# Patient Record
Sex: Male | Born: 1950 | Race: White | Hispanic: No | Marital: Married | Smoking: Former smoker
Health system: Southern US, Community
[De-identification: ages and names within clinical notes are randomized; demographics above are authoritative.]

## PROBLEM LIST (undated history)

## (undated) DIAGNOSIS — M545 Low back pain, unspecified: Secondary | ICD-10-CM

## (undated) DIAGNOSIS — C160 Malignant neoplasm of cardia: Secondary | ICD-10-CM

## (undated) DIAGNOSIS — R251 Tremor, unspecified: Secondary | ICD-10-CM

## (undated) DIAGNOSIS — E7800 Pure hypercholesterolemia, unspecified: Secondary | ICD-10-CM

## (undated) DIAGNOSIS — I219 Acute myocardial infarction, unspecified: Secondary | ICD-10-CM

## (undated) DIAGNOSIS — F419 Anxiety disorder, unspecified: Secondary | ICD-10-CM

## (undated) DIAGNOSIS — I519 Heart disease, unspecified: Secondary | ICD-10-CM

## (undated) DIAGNOSIS — E119 Type 2 diabetes mellitus without complications: Secondary | ICD-10-CM

## (undated) DIAGNOSIS — N400 Enlarged prostate without lower urinary tract symptoms: Secondary | ICD-10-CM

## (undated) DIAGNOSIS — I10 Essential (primary) hypertension: Secondary | ICD-10-CM

## (undated) HISTORY — DX: Heart disease, unspecified: I51.9

## (undated) HISTORY — DX: Pure hypercholesterolemia, unspecified: E78.00

## (undated) HISTORY — DX: Tremor, unspecified: R25.1

## (undated) HISTORY — DX: Essential (primary) hypertension: I10

## (undated) HISTORY — PX: OTHER SURGICAL HISTORY: SHX169

## (undated) HISTORY — DX: Low back pain: M54.5

## (undated) HISTORY — DX: Benign prostatic hyperplasia without lower urinary tract symptoms: N40.0

## (undated) HISTORY — DX: Acute myocardial infarction, unspecified: I21.9

## (undated) HISTORY — DX: Low back pain, unspecified: M54.50

## (undated) HISTORY — DX: Type 2 diabetes mellitus without complications: E11.9

## (undated) HISTORY — DX: Malignant neoplasm of cardia: C16.0

---

## 2015-12-08 ENCOUNTER — Encounter: Payer: Self-pay | Admitting: Neurology

## 2015-12-08 ENCOUNTER — Ambulatory Visit (INDEPENDENT_AMBULATORY_CARE_PROVIDER_SITE_OTHER): Payer: Medicare HMO | Admitting: Neurology

## 2015-12-08 VITALS — BP 111/71 | HR 65 | Ht 70.0 in | Wt 235.0 lb

## 2015-12-08 DIAGNOSIS — R251 Tremor, unspecified: Secondary | ICD-10-CM | POA: Diagnosis not present

## 2015-12-08 NOTE — Progress Notes (Signed)
PATIENT: Timothy Oconnor DOB: 21-Sep-1951  Chief Complaint  Patient presents with  . Tremors    He is here to have his right hand tremor evaluated.  His symptom has been present for 18 months.    HISTORICAL  Timothy Oconnor is a 65 years old right-handed male, seen in refer by his primary care physician Dr. [name] for evaluation of right hand tremor  He began to notice right hand tremor since 2013, most noticeable when he holding utensils with his right hand, playing golf, intermittent, does not affecting his daily activity, seems to be gradually getting worse, no significant involvement of his left hand, he noticed mild unbalance, he denies loss sense of smell, he denies sleep difficulty.  He denies bilateral lower extremity weakness or paresthesia, there was no family history of tremor,  Lab evaluation in  Nov 2016 showed normal CBC, CMP, with elevated Glucose 190, LDL 85, cholestrol 126, normal TSH, A1c 8.0  REVIEW OF SYSTEMS: Full 14 system review of systems performed and notable only for tremor, snoring, leg pain  ALLERGIES: No Known Allergies  HOME MEDICATIONS: Current Outpatient Prescriptions  Medication Sig Dispense Refill  . aspirin EC 81 MG tablet Take 81 mg by mouth.    Marland Kitchen atenolol (TENORMIN) 25 MG tablet Take 25 mg by mouth.    Marland Kitchen atorvastatin (LIPITOR) 80 MG tablet Take 80 mg by mouth.    . clopidogrel (PLAVIX) 75 MG tablet Take 75 mg by mouth.    . ezetimibe (ZETIA) 10 MG tablet Take 10 mg by mouth.    . folic acid (FOLVITE) Q000111Q MCG tablet Take 800 mcg by mouth.    . Glucosamine HCl (GLUCOSAMINE PO) Take 120 mg by mouth 2 (two) times daily.    Marland Kitchen losartan (COZAAR) 50 MG tablet Take 25 mg by mouth.    . metFORMIN (GLUMETZA) 500 MG (MOD) 24 hr tablet Take 500 mg by mouth daily with breakfast.    . Multiple Vitamin (MULTI-VITAMINS) TABS Take 1 tablet by mouth.    . Omega-3 Fatty Acids (FISH OIL) 1000 MG CAPS Take by mouth 2 (two) times daily.    . Saw Palmetto 450 MG  CAPS Take by mouth.    . tamsulosin (FLOMAX) 0.4 MG CAPS capsule Take 0.4 mg by mouth.     No current facility-administered medications for this visit.    PAST MEDICAL HISTORY: Past Medical History  Diagnosis Date  . Hypertension   . High cholesterol   . Heart disease   . Low back pain   . Diabetes ([place])   . Hyperplasia of prostate   . Heart attack ([place])   . Tremor     PAST SURGICAL HISTORY: Past Surgical History  Procedure Laterality Date  . Heart stents      2002, 2004, 2006  . Arterectomy      1993    FAMILY HISTORY: Family History  Problem Relation Age of Onset  . Diabetes Father   . Heart disease Father   . Heart attack Brother   . Colon cancer Brother   . Alzheimer's disease Mother   . Stroke Father     SOCIAL HISTORY:  Social History   Social History  . Marital Status: Married    Spouse Name: N/A  . Number of Children: 2  . Years of Education: 16   Occupational History  . [employer]     Delivers medication   Social History Main Topics  . Smoking status: Former Research scientist (life sciences)  .  Smokeless tobacco: Not on file     Comment: Quit 2004  . Alcohol Use: 0.0 oz/week    0 Standard drinks or equivalent per week     Comment: 3 drinks per week  . Drug Use: No  . Sexual Activity: Not on file   Other Topics Concern  . Not on file   Social History Narrative   Lives at home with his wife.   Right-handed.   2 diet cokes per day.     PHYSICAL EXAM   Filed Vitals:   12/08/15 0733  BP: 111/71  Pulse: 65  Height: 5\' 10"  (1.778 m)  Weight: 235 lb (106.595 kg)    Not recorded      Body mass index is 33.72 kg/(m^2).  PHYSICAL EXAMNIATION:  Gen: NAD, conversant, well nourised, obese, well groomed                     Cardiovascular: Regular rate rhythm, no peripheral edema, warm, nontender. Eyes: Conjunctivae clear without exudates or hemorrhage Neck: Supple, no carotid bruise. Pulmonary: Clear to auscultation bilaterally   NEUROLOGICAL  EXAM:  MENTAL STATUS: Speech:    Speech is normal; fluent and spontaneous with normal comprehension.  Cognition:     Orientation to time, place and person     Normal recent and remote memory     Normal Attention span and concentration     Normal Language, naming, repeating,spontaneous speech     Fund of knowledge   CRANIAL NERVES: CN II: Visual fields are full to confrontation. Fundoscopic exam is normal with sharp discs and no vascular changes. Pupils are round equal and briskly reactive to light. CN III, IV, VI: extraocular movement are normal. No ptosis. CN V: Facial sensation is intact to pinprick in all 3 divisions bilaterally. Corneal responses are intact.  CN VII: Face is symmetric with normal eye closure and smile. CN VIII: Hearing is normal to rubbing fingers CN IX, X: Palate elevates symmetrically. Phonation is normal. CN XI: Head turning and shoulder shrug are intact CN XII: Tongue is midline with normal movements and no atrophy.  MOTOR: He has mild right hand posturing tremor, Muscle bulk and tone are normal. Muscle strength is normal.  REFLEXES: Reflexes are 2+ and symmetric at the biceps, triceps, knees, and ankles. Plantar responses are flexor.  SENSORY: Intact to light touch, pinprick, position sense, and vibration sense are intact in fingers and toes.  COORDINATION: Rapid alternating movements and fine finger movements are intact. There is no dysmetria on finger-to-nose and heel-knee-shin.    GAIT/STANCE: Posture is normal. Gait is steady with normal steps, base, arm swing, and turning. Heel and toe walking are normal. Tandem gait is normal.  Romberg is absent.   DIAGNOSTIC DATA (LABS, IMAGING, TESTING) - I reviewed patient records, labs, notes, testing and imaging myself where available.   ASSESSMENT AND PLAN  Timothy Oconnor is a 65 y.o. male   Mild right hand posturing tremor   No parkinsonian features  Differentiation diagnosis include asymmetric  essential tremor, exaggerated physiological tremor  Continue to observe his symptoms, will hold of evaluation at this point  Return to clinic in one year   Timothy Oconnor, M.D. Ph.D.  [facility] [address] [place],  [address] Ph: [phone] Fax: [phone]  CC: Referring Provider

## 2016-01-13 DIAGNOSIS — Z8249 Family history of ischemic heart disease and other diseases of the circulatory system: Secondary | ICD-10-CM | POA: Diagnosis not present

## 2016-01-13 DIAGNOSIS — Z955 Presence of coronary angioplasty implant and graft: Secondary | ICD-10-CM | POA: Diagnosis not present

## 2016-01-13 DIAGNOSIS — Z1211 Encounter for screening for malignant neoplasm of colon: Secondary | ICD-10-CM | POA: Diagnosis not present

## 2016-01-13 DIAGNOSIS — Z7982 Long term (current) use of aspirin: Secondary | ICD-10-CM | POA: Diagnosis not present

## 2016-01-13 DIAGNOSIS — E119 Type 2 diabetes mellitus without complications: Secondary | ICD-10-CM | POA: Diagnosis not present

## 2016-01-13 DIAGNOSIS — Z7902 Long term (current) use of antithrombotics/antiplatelets: Secondary | ICD-10-CM | POA: Diagnosis not present

## 2016-01-13 DIAGNOSIS — Z7984 Long term (current) use of oral hypoglycemic drugs: Secondary | ICD-10-CM | POA: Diagnosis not present

## 2016-01-13 DIAGNOSIS — Z79899 Other long term (current) drug therapy: Secondary | ICD-10-CM | POA: Diagnosis not present

## 2016-01-13 DIAGNOSIS — Z833 Family history of diabetes mellitus: Secondary | ICD-10-CM | POA: Diagnosis not present

## 2016-01-13 DIAGNOSIS — Z800 Family history of malignant neoplasm of digestive organs: Secondary | ICD-10-CM | POA: Diagnosis not present

## 2016-01-19 DIAGNOSIS — I25110 Atherosclerotic heart disease of native coronary artery with unstable angina pectoris: Secondary | ICD-10-CM | POA: Diagnosis not present

## 2016-01-19 DIAGNOSIS — I259 Chronic ischemic heart disease, unspecified: Secondary | ICD-10-CM | POA: Diagnosis not present

## 2016-01-19 DIAGNOSIS — I252 Old myocardial infarction: Secondary | ICD-10-CM | POA: Diagnosis not present

## 2016-01-19 DIAGNOSIS — Z7982 Long term (current) use of aspirin: Secondary | ICD-10-CM | POA: Diagnosis not present

## 2016-03-09 DIAGNOSIS — E1165 Type 2 diabetes mellitus with hyperglycemia: Secondary | ICD-10-CM | POA: Diagnosis not present

## 2016-03-09 DIAGNOSIS — R251 Tremor, unspecified: Secondary | ICD-10-CM | POA: Diagnosis not present

## 2016-05-29 DIAGNOSIS — L570 Actinic keratosis: Secondary | ICD-10-CM | POA: Diagnosis not present

## 2016-07-31 DIAGNOSIS — I10 Essential (primary) hypertension: Secondary | ICD-10-CM | POA: Diagnosis not present

## 2016-07-31 DIAGNOSIS — E7800 Pure hypercholesterolemia, unspecified: Secondary | ICD-10-CM | POA: Diagnosis not present

## 2016-07-31 DIAGNOSIS — E1165 Type 2 diabetes mellitus with hyperglycemia: Secondary | ICD-10-CM | POA: Diagnosis not present

## 2016-08-03 DIAGNOSIS — R251 Tremor, unspecified: Secondary | ICD-10-CM | POA: Diagnosis not present

## 2016-08-03 DIAGNOSIS — M545 Low back pain: Secondary | ICD-10-CM | POA: Diagnosis not present

## 2016-08-03 DIAGNOSIS — Z6830 Body mass index (BMI) 30.0-30.9, adult: Secondary | ICD-10-CM | POA: Diagnosis not present

## 2016-08-03 DIAGNOSIS — I10 Essential (primary) hypertension: Secondary | ICD-10-CM | POA: Diagnosis not present

## 2016-08-03 DIAGNOSIS — Z13220 Encounter for screening for lipoid disorders: Secondary | ICD-10-CM | POA: Diagnosis not present

## 2016-08-03 DIAGNOSIS — E1165 Type 2 diabetes mellitus with hyperglycemia: Secondary | ICD-10-CM | POA: Diagnosis not present

## 2016-08-03 DIAGNOSIS — M5431 Sciatica, right side: Secondary | ICD-10-CM | POA: Diagnosis not present

## 2016-12-06 DIAGNOSIS — E1165 Type 2 diabetes mellitus with hyperglycemia: Secondary | ICD-10-CM | POA: Diagnosis not present

## 2016-12-06 DIAGNOSIS — E7800 Pure hypercholesterolemia, unspecified: Secondary | ICD-10-CM | POA: Diagnosis not present

## 2016-12-06 DIAGNOSIS — I10 Essential (primary) hypertension: Secondary | ICD-10-CM | POA: Diagnosis not present

## 2016-12-06 DIAGNOSIS — R251 Tremor, unspecified: Secondary | ICD-10-CM | POA: Diagnosis not present

## 2016-12-07 ENCOUNTER — Ambulatory Visit (INDEPENDENT_AMBULATORY_CARE_PROVIDER_SITE_OTHER): Payer: Medicare HMO | Admitting: Neurology

## 2016-12-07 ENCOUNTER — Encounter: Payer: Self-pay | Admitting: Neurology

## 2016-12-07 VITALS — BP 131/80 | HR 64 | Ht 70.0 in | Wt 222.0 lb

## 2016-12-07 DIAGNOSIS — R251 Tremor, unspecified: Secondary | ICD-10-CM

## 2016-12-07 NOTE — Progress Notes (Signed)
PATIENT: Timothy Oconnor DOB: [birth year]  Chief Complaint  Patient presents with  . Tremors    He is here for his yearly follow up.  Reports his intermittent, right-hand tremor is unchanged.    HISTORICAL  Timothy Oconnor is a 66 years old right-handed male, seen in refer by his primary care physician Dr. [name] for evaluation of right hand tremor. Initial evaluation was in Jan 4th 2017.  He began to notice right hand tremor since 2013, most noticeable when he holding utensils with his right hand, playing golf, intermittent, does not affecting his daily activity, seems to be gradually getting worse, no significant involvement of his left hand, he noticed mild unbalance, he denies loss sense of smell, he denies sleep difficulty.  He denies bilateral lower extremity weakness or paresthesia, there was no family history of tremor,  Lab evaluation in  Nov 2016 showed normal CBC, CMP, with elevated Glucose 190, LDL 85, cholestrol 126, normal TSH, A1c 8.0  UPDATE Jan 4th 2018: He still has intermittent right hand tremor, no significant worsening, no limitation on his daily activities.  REVIEW OF SYSTEMS: Full 14 system review of systems performed and notable only for as above   ALLERGIES: No Known Allergies  HOME MEDICATIONS: Current Outpatient Prescriptions  Medication Sig Dispense Refill  . aspirin EC 81 MG tablet Take 81 mg by mouth.    Marland Kitchen atenolol (TENORMIN) 25 MG tablet Take 25 mg by mouth.    Marland Kitchen atorvastatin (LIPITOR) 80 MG tablet Take 80 mg by mouth.    . clopidogrel (PLAVIX) 75 MG tablet Take 75 mg by mouth.    . ezetimibe (ZETIA) 10 MG tablet Take 10 mg by mouth.    . folic acid (FOLVITE) Q000111Q MCG tablet Take 800 mcg by mouth.    . Glucosamine HCl (GLUCOSAMINE PO) Take 120 mg by mouth 2 (two) times daily.    Marland Kitchen losartan (COZAAR) 50 MG tablet Take 25 mg by mouth.    . metFORMIN (GLUMETZA) 500 MG (MOD) 24 hr tablet Take 500 mg by mouth daily with breakfast.    . Multiple  Vitamin (MULTI-VITAMINS) TABS Take 1 tablet by mouth.    . Omega-3 Fatty Acids (FISH OIL) 1000 MG CAPS Take by mouth 2 (two) times daily.    . Saw Palmetto 450 MG CAPS Take by mouth.    . tamsulosin (FLOMAX) 0.4 MG CAPS capsule Take 0.4 mg by mouth.     No current facility-administered medications for this visit.     PAST MEDICAL HISTORY: Past Medical History:  Diagnosis Date  . Diabetes ([place])   . Heart attack   . Heart disease   . High cholesterol   . Hyperplasia of prostate   . Hypertension   . Low back pain   . Tremor     PAST SURGICAL HISTORY: Past Surgical History:  Procedure Laterality Date  . arterectomy     1993  . Heart stents     2002, 2004, 2006    FAMILY HISTORY: Family History  Problem Relation Age of Onset  . Diabetes Father   . Heart disease Father   . Heart attack Brother   . Colon cancer Brother   . Alzheimer's disease Mother   . Stroke Father     SOCIAL HISTORY:  Social History   Social History  . Marital status: Married    Spouse name: N/A  . Number of children: 2  . Years of education: 16   Occupational History  .  [employer]     Delivers medication   Social History Main Topics  . Smoking status: Former Research scientist (life sciences)  . Smokeless tobacco: Not on file     Comment: Quit 2004  . Alcohol use 0.0 oz/week     Comment: 3 drinks per week  . Drug use: No  . Sexual activity: Not on file   Other Topics Concern  . Not on file   Social History Narrative   Lives at home with his wife.   Right-handed.   2 diet cokes per day.     PHYSICAL EXAM   Vitals:   12/07/16 0730  BP: 131/80  Pulse: 64  Weight: 222 lb (100.7 kg)  Height: 5\' 10"  (1.778 m)    Not recorded      Body mass index is 31.85 kg/m.  PHYSICAL EXAMNIATION:  Gen: NAD, conversant, well nourised, obese, well groomed                     Cardiovascular: Regular rate rhythm, no peripheral edema, warm, nontender. Eyes: Conjunctivae clear without exudates or hemorrhage Neck:  Supple, no carotid bruise. Pulmonary: Clear to auscultation bilaterally   NEUROLOGICAL EXAM:  MENTAL STATUS: Speech:    Speech is normal; fluent and spontaneous with normal comprehension.  Cognition:     Orientation to time, place and person     Normal recent and remote memory     Normal Attention span and concentration     Normal Language, naming, repeating,spontaneous speech     Fund of knowledge   CRANIAL NERVES: CN II: Visual fields are full to confrontation. Fundoscopic exam is normal with sharp discs and no vascular changes. Pupils are round equal and briskly reactive to light. CN III, IV, VI: extraocular movement are normal. No ptosis. CN V: Facial sensation is intact to pinprick in all 3 divisions bilaterally. Corneal responses are intact.  CN VII: Face is symmetric with normal eye closure and smile. CN VIII: Hearing is normal to rubbing fingers CN IX, X: Palate elevates symmetrically. Phonation is normal. CN XI: Head turning and shoulder shrug are intact CN XII: Tongue is midline with normal movements and no atrophy.  MOTOR: He has mild right hand posturing tremor, Muscle bulk and tone are normal. Muscle strength is normal.  REFLEXES: Reflexes are 2+ and symmetric at the biceps, triceps, knees, and ankles. Plantar responses are flexor.  SENSORY: Intact to light touch, pinprick, position sense, and vibration sense are intact in fingers and toes.  COORDINATION: Rapid alternating movements and fine finger movements are intact. There is no dysmetria on finger-to-nose and heel-knee-shin.    GAIT/STANCE: Posture is normal. Gait is steady with normal steps, base, arm swing, and turning. Heel and toe walking are normal. Tandem gait is normal.  Romberg is absent.   DIAGNOSTIC DATA (LABS, IMAGING, TESTING) - I reviewed patient records, labs, notes, testing and imaging myself where available.   ASSESSMENT AND PLAN  Timothy Oconnor is a 66 y.o. male   Mild right hand  posturing tremor   No parkinsonian features  Differentiation diagnosis include asymmetric essential tremor, exaggerated physiological tremor  Continue to observe his symptoms, will hold of evaluation at this point     Marcial Pacas, M.D. Ph.D.  [facility] [address] [place], [place] [address] Ph: [phone] Fax: [phone]  CC: Referring Provider

## 2016-12-12 DIAGNOSIS — M545 Low back pain: Secondary | ICD-10-CM | POA: Diagnosis not present

## 2016-12-12 DIAGNOSIS — I10 Essential (primary) hypertension: Secondary | ICD-10-CM | POA: Diagnosis not present

## 2016-12-12 DIAGNOSIS — R251 Tremor, unspecified: Secondary | ICD-10-CM | POA: Diagnosis not present

## 2016-12-12 DIAGNOSIS — Z0001 Encounter for general adult medical examination with abnormal findings: Secondary | ICD-10-CM | POA: Diagnosis not present

## 2016-12-12 DIAGNOSIS — Z1389 Encounter for screening for other disorder: Secondary | ICD-10-CM | POA: Diagnosis not present

## 2016-12-12 DIAGNOSIS — Z6830 Body mass index (BMI) 30.0-30.9, adult: Secondary | ICD-10-CM | POA: Diagnosis not present

## 2016-12-18 DIAGNOSIS — L570 Actinic keratosis: Secondary | ICD-10-CM | POA: Diagnosis not present

## 2017-01-24 DIAGNOSIS — Z7982 Long term (current) use of aspirin: Secondary | ICD-10-CM | POA: Diagnosis not present

## 2017-01-24 DIAGNOSIS — Z7901 Long term (current) use of anticoagulants: Secondary | ICD-10-CM | POA: Diagnosis not present

## 2017-01-24 DIAGNOSIS — Z955 Presence of coronary angioplasty implant and graft: Secondary | ICD-10-CM | POA: Diagnosis not present

## 2017-01-24 DIAGNOSIS — I259 Chronic ischemic heart disease, unspecified: Secondary | ICD-10-CM | POA: Diagnosis not present

## 2017-01-24 DIAGNOSIS — I25118 Atherosclerotic heart disease of native coronary artery with other forms of angina pectoris: Secondary | ICD-10-CM | POA: Diagnosis not present

## 2017-04-18 DIAGNOSIS — Z6832 Body mass index (BMI) 32.0-32.9, adult: Secondary | ICD-10-CM | POA: Diagnosis not present

## 2017-04-18 DIAGNOSIS — J0101 Acute recurrent maxillary sinusitis: Secondary | ICD-10-CM | POA: Diagnosis not present

## 2017-06-26 DIAGNOSIS — D485 Neoplasm of uncertain behavior of skin: Secondary | ICD-10-CM | POA: Diagnosis not present

## 2017-06-26 DIAGNOSIS — L570 Actinic keratosis: Secondary | ICD-10-CM | POA: Diagnosis not present

## 2017-06-26 DIAGNOSIS — Z85828 Personal history of other malignant neoplasm of skin: Secondary | ICD-10-CM | POA: Diagnosis not present

## 2017-09-10 DIAGNOSIS — Z23 Encounter for immunization: Secondary | ICD-10-CM | POA: Diagnosis not present

## 2017-09-12 DIAGNOSIS — R310 Gross hematuria: Secondary | ICD-10-CM | POA: Diagnosis not present

## 2017-09-12 DIAGNOSIS — Z6833 Body mass index (BMI) 33.0-33.9, adult: Secondary | ICD-10-CM | POA: Diagnosis not present

## 2017-09-18 DIAGNOSIS — R310 Gross hematuria: Secondary | ICD-10-CM | POA: Diagnosis not present

## 2017-09-18 DIAGNOSIS — N281 Cyst of kidney, acquired: Secondary | ICD-10-CM | POA: Diagnosis not present

## 2017-09-18 DIAGNOSIS — N400 Enlarged prostate without lower urinary tract symptoms: Secondary | ICD-10-CM | POA: Diagnosis not present

## 2017-09-30 DIAGNOSIS — Z87891 Personal history of nicotine dependence: Secondary | ICD-10-CM | POA: Diagnosis not present

## 2017-09-30 DIAGNOSIS — R9431 Abnormal electrocardiogram [ECG] [EKG]: Secondary | ICD-10-CM | POA: Diagnosis not present

## 2017-09-30 DIAGNOSIS — Z9861 Coronary angioplasty status: Secondary | ICD-10-CM | POA: Diagnosis not present

## 2017-09-30 DIAGNOSIS — I2510 Atherosclerotic heart disease of native coronary artery without angina pectoris: Secondary | ICD-10-CM | POA: Diagnosis not present

## 2017-09-30 DIAGNOSIS — Z955 Presence of coronary angioplasty implant and graft: Secondary | ICD-10-CM | POA: Diagnosis not present

## 2017-09-30 DIAGNOSIS — I110 Hypertensive heart disease with heart failure: Secondary | ICD-10-CM | POA: Diagnosis not present

## 2017-09-30 DIAGNOSIS — E7800 Pure hypercholesterolemia, unspecified: Secondary | ICD-10-CM | POA: Diagnosis not present

## 2017-09-30 DIAGNOSIS — Z79899 Other long term (current) drug therapy: Secondary | ICD-10-CM | POA: Diagnosis not present

## 2017-09-30 DIAGNOSIS — I5020 Unspecified systolic (congestive) heart failure: Secondary | ICD-10-CM | POA: Diagnosis not present

## 2017-09-30 DIAGNOSIS — I10 Essential (primary) hypertension: Secondary | ICD-10-CM | POA: Diagnosis not present

## 2017-09-30 DIAGNOSIS — Z952 Presence of prosthetic heart valve: Secondary | ICD-10-CM | POA: Diagnosis not present

## 2017-09-30 DIAGNOSIS — I2129 ST elevation (STEMI) myocardial infarction involving other sites: Secondary | ICD-10-CM | POA: Diagnosis not present

## 2017-09-30 DIAGNOSIS — Z7902 Long term (current) use of antithrombotics/antiplatelets: Secondary | ICD-10-CM | POA: Diagnosis not present

## 2017-09-30 DIAGNOSIS — I219 Acute myocardial infarction, unspecified: Secondary | ICD-10-CM | POA: Diagnosis not present

## 2017-09-30 DIAGNOSIS — I2121 ST elevation (STEMI) myocardial infarction involving left circumflex coronary artery: Secondary | ICD-10-CM | POA: Diagnosis not present

## 2017-09-30 DIAGNOSIS — E118 Type 2 diabetes mellitus with unspecified complications: Secondary | ICD-10-CM | POA: Diagnosis not present

## 2017-09-30 DIAGNOSIS — N400 Enlarged prostate without lower urinary tract symptoms: Secondary | ICD-10-CM | POA: Diagnosis not present

## 2017-09-30 DIAGNOSIS — Z7901 Long term (current) use of anticoagulants: Secondary | ICD-10-CM | POA: Diagnosis not present

## 2017-09-30 DIAGNOSIS — I252 Old myocardial infarction: Secondary | ICD-10-CM | POA: Diagnosis not present

## 2017-09-30 DIAGNOSIS — E119 Type 2 diabetes mellitus without complications: Secondary | ICD-10-CM | POA: Diagnosis not present

## 2017-09-30 DIAGNOSIS — Z7982 Long term (current) use of aspirin: Secondary | ICD-10-CM | POA: Diagnosis not present

## 2017-09-30 DIAGNOSIS — R079 Chest pain, unspecified: Secondary | ICD-10-CM | POA: Diagnosis not present

## 2017-09-30 DIAGNOSIS — Z66 Do not resuscitate: Secondary | ICD-10-CM | POA: Diagnosis not present

## 2017-09-30 DIAGNOSIS — Z7984 Long term (current) use of oral hypoglycemic drugs: Secondary | ICD-10-CM | POA: Diagnosis not present

## 2017-09-30 DIAGNOSIS — I213 ST elevation (STEMI) myocardial infarction of unspecified site: Secondary | ICD-10-CM | POA: Diagnosis not present

## 2017-09-30 DIAGNOSIS — I2584 Coronary atherosclerosis due to calcified coronary lesion: Secondary | ICD-10-CM | POA: Diagnosis not present

## 2017-09-30 DIAGNOSIS — I25119 Atherosclerotic heart disease of native coronary artery with unspecified angina pectoris: Secondary | ICD-10-CM | POA: Diagnosis not present

## 2017-09-30 DIAGNOSIS — I348 Other nonrheumatic mitral valve disorders: Secondary | ICD-10-CM | POA: Diagnosis not present

## 2017-11-01 DIAGNOSIS — I10 Essential (primary) hypertension: Secondary | ICD-10-CM | POA: Diagnosis not present

## 2017-11-01 DIAGNOSIS — Z6832 Body mass index (BMI) 32.0-32.9, adult: Secondary | ICD-10-CM | POA: Diagnosis not present

## 2017-11-01 DIAGNOSIS — I25119 Atherosclerotic heart disease of native coronary artery with unspecified angina pectoris: Secondary | ICD-10-CM | POA: Diagnosis not present

## 2017-11-01 DIAGNOSIS — E7800 Pure hypercholesterolemia, unspecified: Secondary | ICD-10-CM | POA: Diagnosis not present

## 2017-11-06 DIAGNOSIS — Z7982 Long term (current) use of aspirin: Secondary | ICD-10-CM | POA: Diagnosis not present

## 2017-11-06 DIAGNOSIS — I259 Chronic ischemic heart disease, unspecified: Secondary | ICD-10-CM | POA: Diagnosis not present

## 2017-11-06 DIAGNOSIS — I25118 Atherosclerotic heart disease of native coronary artery with other forms of angina pectoris: Secondary | ICD-10-CM | POA: Diagnosis not present

## 2017-11-06 DIAGNOSIS — I252 Old myocardial infarction: Secondary | ICD-10-CM | POA: Diagnosis not present

## 2017-11-06 DIAGNOSIS — Z955 Presence of coronary angioplasty implant and graft: Secondary | ICD-10-CM | POA: Diagnosis not present

## 2017-11-06 DIAGNOSIS — R001 Bradycardia, unspecified: Secondary | ICD-10-CM | POA: Diagnosis not present

## 2017-11-06 DIAGNOSIS — Z87891 Personal history of nicotine dependence: Secondary | ICD-10-CM | POA: Diagnosis not present

## 2017-11-06 DIAGNOSIS — I25110 Atherosclerotic heart disease of native coronary artery with unstable angina pectoris: Secondary | ICD-10-CM | POA: Diagnosis not present

## 2017-11-06 DIAGNOSIS — Z7901 Long term (current) use of anticoagulants: Secondary | ICD-10-CM | POA: Diagnosis not present

## 2017-11-23 ENCOUNTER — Ambulatory Visit: Payer: Medicare HMO | Admitting: Urology

## 2017-11-23 ENCOUNTER — Other Ambulatory Visit (HOSPITAL_COMMUNITY)
Admission: RE | Admit: 2017-11-23 | Discharge: 2017-11-23 | Disposition: A | Discharge status: Home / Self Care | Payer: Medicare HMO | Source: Ambulatory Visit | Attending: Urology | Admitting: Urology

## 2017-11-23 DIAGNOSIS — R828 Abnormal findings on cytological and histological examination of urine: Secondary | ICD-10-CM | POA: Diagnosis not present

## 2017-11-23 DIAGNOSIS — N401 Enlarged prostate with lower urinary tract symptoms: Secondary | ICD-10-CM | POA: Diagnosis not present

## 2017-11-23 DIAGNOSIS — R310 Gross hematuria: Secondary | ICD-10-CM

## 2017-11-23 DIAGNOSIS — R350 Frequency of micturition: Secondary | ICD-10-CM

## 2017-12-13 DIAGNOSIS — I10 Essential (primary) hypertension: Secondary | ICD-10-CM | POA: Diagnosis not present

## 2017-12-13 DIAGNOSIS — E7800 Pure hypercholesterolemia, unspecified: Secondary | ICD-10-CM | POA: Diagnosis not present

## 2017-12-13 DIAGNOSIS — E1165 Type 2 diabetes mellitus with hyperglycemia: Secondary | ICD-10-CM | POA: Diagnosis not present

## 2017-12-14 ENCOUNTER — Ambulatory Visit: Payer: Medicare HMO | Admitting: Urology

## 2017-12-14 DIAGNOSIS — N401 Enlarged prostate with lower urinary tract symptoms: Secondary | ICD-10-CM

## 2017-12-14 DIAGNOSIS — R310 Gross hematuria: Secondary | ICD-10-CM

## 2017-12-14 DIAGNOSIS — R351 Nocturia: Secondary | ICD-10-CM | POA: Diagnosis not present

## 2017-12-17 DIAGNOSIS — E1165 Type 2 diabetes mellitus with hyperglycemia: Secondary | ICD-10-CM | POA: Diagnosis not present

## 2017-12-17 DIAGNOSIS — N400 Enlarged prostate without lower urinary tract symptoms: Secondary | ICD-10-CM | POA: Diagnosis not present

## 2017-12-17 DIAGNOSIS — E7800 Pure hypercholesterolemia, unspecified: Secondary | ICD-10-CM | POA: Diagnosis not present

## 2017-12-17 DIAGNOSIS — I25119 Atherosclerotic heart disease of native coronary artery with unspecified angina pectoris: Secondary | ICD-10-CM | POA: Diagnosis not present

## 2017-12-17 DIAGNOSIS — Z6833 Body mass index (BMI) 33.0-33.9, adult: Secondary | ICD-10-CM | POA: Diagnosis not present

## 2017-12-17 DIAGNOSIS — Z0001 Encounter for general adult medical examination with abnormal findings: Secondary | ICD-10-CM | POA: Diagnosis not present

## 2017-12-25 DIAGNOSIS — L570 Actinic keratosis: Secondary | ICD-10-CM | POA: Diagnosis not present

## 2018-02-05 DIAGNOSIS — Z87891 Personal history of nicotine dependence: Secondary | ICD-10-CM | POA: Diagnosis not present

## 2018-02-05 DIAGNOSIS — Z7901 Long term (current) use of anticoagulants: Secondary | ICD-10-CM | POA: Diagnosis not present

## 2018-02-05 DIAGNOSIS — Z7982 Long term (current) use of aspirin: Secondary | ICD-10-CM | POA: Diagnosis not present

## 2018-02-05 DIAGNOSIS — I252 Old myocardial infarction: Secondary | ICD-10-CM | POA: Diagnosis not present

## 2018-02-05 DIAGNOSIS — I25118 Atherosclerotic heart disease of native coronary artery with other forms of angina pectoris: Secondary | ICD-10-CM | POA: Diagnosis not present

## 2018-02-05 DIAGNOSIS — Z955 Presence of coronary angioplasty implant and graft: Secondary | ICD-10-CM | POA: Diagnosis not present

## 2018-02-05 DIAGNOSIS — I2510 Atherosclerotic heart disease of native coronary artery without angina pectoris: Secondary | ICD-10-CM | POA: Diagnosis not present

## 2018-06-17 DIAGNOSIS — E7800 Pure hypercholesterolemia, unspecified: Secondary | ICD-10-CM | POA: Diagnosis not present

## 2018-06-17 DIAGNOSIS — E1165 Type 2 diabetes mellitus with hyperglycemia: Secondary | ICD-10-CM | POA: Diagnosis not present

## 2018-06-17 DIAGNOSIS — I10 Essential (primary) hypertension: Secondary | ICD-10-CM | POA: Diagnosis not present

## 2018-06-17 DIAGNOSIS — M545 Low back pain: Secondary | ICD-10-CM | POA: Diagnosis not present

## 2018-06-24 DIAGNOSIS — D485 Neoplasm of uncertain behavior of skin: Secondary | ICD-10-CM | POA: Diagnosis not present

## 2018-06-24 DIAGNOSIS — Z85828 Personal history of other malignant neoplasm of skin: Secondary | ICD-10-CM | POA: Diagnosis not present

## 2018-06-24 DIAGNOSIS — L570 Actinic keratosis: Secondary | ICD-10-CM | POA: Diagnosis not present

## 2018-06-26 DIAGNOSIS — E7800 Pure hypercholesterolemia, unspecified: Secondary | ICD-10-CM | POA: Diagnosis not present

## 2018-06-26 DIAGNOSIS — I25119 Atherosclerotic heart disease of native coronary artery with unspecified angina pectoris: Secondary | ICD-10-CM | POA: Diagnosis not present

## 2018-06-26 DIAGNOSIS — Z6832 Body mass index (BMI) 32.0-32.9, adult: Secondary | ICD-10-CM | POA: Diagnosis not present

## 2018-06-26 DIAGNOSIS — E1165 Type 2 diabetes mellitus with hyperglycemia: Secondary | ICD-10-CM | POA: Diagnosis not present

## 2018-06-26 DIAGNOSIS — I10 Essential (primary) hypertension: Secondary | ICD-10-CM | POA: Diagnosis not present

## 2018-06-26 DIAGNOSIS — R251 Tremor, unspecified: Secondary | ICD-10-CM | POA: Diagnosis not present

## 2018-08-20 DIAGNOSIS — Z87891 Personal history of nicotine dependence: Secondary | ICD-10-CM | POA: Diagnosis not present

## 2018-08-20 DIAGNOSIS — I259 Chronic ischemic heart disease, unspecified: Secondary | ICD-10-CM | POA: Diagnosis not present

## 2018-08-20 DIAGNOSIS — I25119 Atherosclerotic heart disease of native coronary artery with unspecified angina pectoris: Secondary | ICD-10-CM | POA: Diagnosis not present

## 2018-08-20 DIAGNOSIS — Z95818 Presence of other cardiac implants and grafts: Secondary | ICD-10-CM | POA: Diagnosis not present

## 2018-08-20 DIAGNOSIS — R9431 Abnormal electrocardiogram [ECG] [EKG]: Secondary | ICD-10-CM | POA: Diagnosis not present

## 2018-08-20 DIAGNOSIS — I252 Old myocardial infarction: Secondary | ICD-10-CM | POA: Diagnosis not present

## 2018-08-20 DIAGNOSIS — Z955 Presence of coronary angioplasty implant and graft: Secondary | ICD-10-CM | POA: Diagnosis not present

## 2018-08-20 DIAGNOSIS — Z7982 Long term (current) use of aspirin: Secondary | ICD-10-CM | POA: Diagnosis not present

## 2018-08-20 DIAGNOSIS — I2510 Atherosclerotic heart disease of native coronary artery without angina pectoris: Secondary | ICD-10-CM | POA: Diagnosis not present

## 2018-08-20 DIAGNOSIS — Z79899 Other long term (current) drug therapy: Secondary | ICD-10-CM | POA: Diagnosis not present

## 2018-08-26 DIAGNOSIS — R69 Illness, unspecified: Secondary | ICD-10-CM | POA: Diagnosis not present

## 2018-12-09 DIAGNOSIS — K921 Melena: Secondary | ICD-10-CM | POA: Diagnosis not present

## 2018-12-09 DIAGNOSIS — I10 Essential (primary) hypertension: Secondary | ICD-10-CM | POA: Diagnosis not present

## 2018-12-09 DIAGNOSIS — J0101 Acute recurrent maxillary sinusitis: Secondary | ICD-10-CM | POA: Diagnosis not present

## 2018-12-09 DIAGNOSIS — I25119 Atherosclerotic heart disease of native coronary artery with unspecified angina pectoris: Secondary | ICD-10-CM | POA: Diagnosis not present

## 2018-12-09 DIAGNOSIS — Z6833 Body mass index (BMI) 33.0-33.9, adult: Secondary | ICD-10-CM | POA: Diagnosis not present

## 2018-12-09 DIAGNOSIS — E1165 Type 2 diabetes mellitus with hyperglycemia: Secondary | ICD-10-CM | POA: Diagnosis not present

## 2018-12-18 DIAGNOSIS — E7800 Pure hypercholesterolemia, unspecified: Secondary | ICD-10-CM | POA: Diagnosis not present

## 2018-12-18 DIAGNOSIS — I25119 Atherosclerotic heart disease of native coronary artery with unspecified angina pectoris: Secondary | ICD-10-CM | POA: Diagnosis not present

## 2018-12-18 DIAGNOSIS — E1165 Type 2 diabetes mellitus with hyperglycemia: Secondary | ICD-10-CM | POA: Diagnosis not present

## 2018-12-18 DIAGNOSIS — I10 Essential (primary) hypertension: Secondary | ICD-10-CM | POA: Diagnosis not present

## 2018-12-26 DIAGNOSIS — Z6832 Body mass index (BMI) 32.0-32.9, adult: Secondary | ICD-10-CM | POA: Diagnosis not present

## 2018-12-26 DIAGNOSIS — Z0001 Encounter for general adult medical examination with abnormal findings: Secondary | ICD-10-CM | POA: Diagnosis not present

## 2018-12-26 DIAGNOSIS — I10 Essential (primary) hypertension: Secondary | ICD-10-CM | POA: Diagnosis not present

## 2018-12-30 DIAGNOSIS — L570 Actinic keratosis: Secondary | ICD-10-CM | POA: Diagnosis not present

## 2018-12-30 DIAGNOSIS — D485 Neoplasm of uncertain behavior of skin: Secondary | ICD-10-CM | POA: Diagnosis not present

## 2018-12-30 DIAGNOSIS — L111 Transient acantholytic dermatosis [Grover]: Secondary | ICD-10-CM | POA: Diagnosis not present

## 2019-01-22 DIAGNOSIS — D649 Anemia, unspecified: Secondary | ICD-10-CM | POA: Diagnosis not present

## 2019-01-22 DIAGNOSIS — D529 Folate deficiency anemia, unspecified: Secondary | ICD-10-CM | POA: Diagnosis not present

## 2019-01-22 DIAGNOSIS — D519 Vitamin B12 deficiency anemia, unspecified: Secondary | ICD-10-CM | POA: Diagnosis not present

## 2019-03-05 DIAGNOSIS — D529 Folate deficiency anemia, unspecified: Secondary | ICD-10-CM | POA: Diagnosis not present

## 2019-03-05 DIAGNOSIS — D519 Vitamin B12 deficiency anemia, unspecified: Secondary | ICD-10-CM | POA: Diagnosis not present

## 2019-03-05 DIAGNOSIS — D649 Anemia, unspecified: Secondary | ICD-10-CM | POA: Diagnosis not present

## 2019-05-27 DIAGNOSIS — N400 Enlarged prostate without lower urinary tract symptoms: Secondary | ICD-10-CM | POA: Diagnosis not present

## 2019-05-27 DIAGNOSIS — I10 Essential (primary) hypertension: Secondary | ICD-10-CM | POA: Diagnosis not present

## 2019-05-27 DIAGNOSIS — I252 Old myocardial infarction: Secondary | ICD-10-CM | POA: Diagnosis not present

## 2019-05-27 DIAGNOSIS — I213 ST elevation (STEMI) myocardial infarction of unspecified site: Secondary | ICD-10-CM | POA: Diagnosis not present

## 2019-05-27 DIAGNOSIS — I25110 Atherosclerotic heart disease of native coronary artery with unstable angina pectoris: Secondary | ICD-10-CM | POA: Diagnosis not present

## 2019-05-27 DIAGNOSIS — Z87891 Personal history of nicotine dependence: Secondary | ICD-10-CM | POA: Diagnosis not present

## 2019-05-27 DIAGNOSIS — Z955 Presence of coronary angioplasty implant and graft: Secondary | ICD-10-CM | POA: Diagnosis not present

## 2019-05-27 DIAGNOSIS — E785 Hyperlipidemia, unspecified: Secondary | ICD-10-CM | POA: Diagnosis not present

## 2019-05-27 DIAGNOSIS — I25118 Atherosclerotic heart disease of native coronary artery with other forms of angina pectoris: Secondary | ICD-10-CM | POA: Diagnosis not present

## 2019-05-27 DIAGNOSIS — Z7982 Long term (current) use of aspirin: Secondary | ICD-10-CM | POA: Diagnosis not present

## 2019-06-24 DIAGNOSIS — C159 Malignant neoplasm of esophagus, unspecified: Secondary | ICD-10-CM | POA: Diagnosis not present

## 2019-07-01 DIAGNOSIS — L570 Actinic keratosis: Secondary | ICD-10-CM | POA: Diagnosis not present

## 2019-07-01 DIAGNOSIS — D485 Neoplasm of uncertain behavior of skin: Secondary | ICD-10-CM | POA: Diagnosis not present

## 2019-07-01 DIAGNOSIS — D0439 Carcinoma in situ of skin of other parts of face: Secondary | ICD-10-CM | POA: Diagnosis not present

## 2019-07-04 DIAGNOSIS — E119 Type 2 diabetes mellitus without complications: Secondary | ICD-10-CM | POA: Diagnosis not present

## 2019-07-04 DIAGNOSIS — I10 Essential (primary) hypertension: Secondary | ICD-10-CM | POA: Diagnosis not present

## 2019-07-10 DIAGNOSIS — C44329 Squamous cell carcinoma of skin of other parts of face: Secondary | ICD-10-CM | POA: Diagnosis not present

## 2019-07-16 DIAGNOSIS — R69 Illness, unspecified: Secondary | ICD-10-CM | POA: Diagnosis not present

## 2019-07-21 DIAGNOSIS — S233XXA Sprain of ligaments of thoracic spine, initial encounter: Secondary | ICD-10-CM | POA: Diagnosis not present

## 2019-07-21 DIAGNOSIS — S335XXA Sprain of ligaments of lumbar spine, initial encounter: Secondary | ICD-10-CM | POA: Diagnosis not present

## 2019-07-21 DIAGNOSIS — M9902 Segmental and somatic dysfunction of thoracic region: Secondary | ICD-10-CM | POA: Diagnosis not present

## 2019-07-21 DIAGNOSIS — M47816 Spondylosis without myelopathy or radiculopathy, lumbar region: Secondary | ICD-10-CM | POA: Diagnosis not present

## 2019-07-21 DIAGNOSIS — M5442 Lumbago with sciatica, left side: Secondary | ICD-10-CM | POA: Diagnosis not present

## 2019-07-21 DIAGNOSIS — M9903 Segmental and somatic dysfunction of lumbar region: Secondary | ICD-10-CM | POA: Diagnosis not present

## 2019-07-22 DIAGNOSIS — S233XXA Sprain of ligaments of thoracic spine, initial encounter: Secondary | ICD-10-CM | POA: Diagnosis not present

## 2019-07-22 DIAGNOSIS — M5442 Lumbago with sciatica, left side: Secondary | ICD-10-CM | POA: Diagnosis not present

## 2019-07-22 DIAGNOSIS — S335XXA Sprain of ligaments of lumbar spine, initial encounter: Secondary | ICD-10-CM | POA: Diagnosis not present

## 2019-07-22 DIAGNOSIS — M9902 Segmental and somatic dysfunction of thoracic region: Secondary | ICD-10-CM | POA: Diagnosis not present

## 2019-07-22 DIAGNOSIS — M47816 Spondylosis without myelopathy or radiculopathy, lumbar region: Secondary | ICD-10-CM | POA: Diagnosis not present

## 2019-07-22 DIAGNOSIS — M9903 Segmental and somatic dysfunction of lumbar region: Secondary | ICD-10-CM | POA: Diagnosis not present

## 2019-07-23 DIAGNOSIS — S335XXA Sprain of ligaments of lumbar spine, initial encounter: Secondary | ICD-10-CM | POA: Diagnosis not present

## 2019-07-23 DIAGNOSIS — M9902 Segmental and somatic dysfunction of thoracic region: Secondary | ICD-10-CM | POA: Diagnosis not present

## 2019-07-23 DIAGNOSIS — M47816 Spondylosis without myelopathy or radiculopathy, lumbar region: Secondary | ICD-10-CM | POA: Diagnosis not present

## 2019-07-23 DIAGNOSIS — S233XXA Sprain of ligaments of thoracic spine, initial encounter: Secondary | ICD-10-CM | POA: Diagnosis not present

## 2019-07-23 DIAGNOSIS — M5442 Lumbago with sciatica, left side: Secondary | ICD-10-CM | POA: Diagnosis not present

## 2019-07-23 DIAGNOSIS — M9903 Segmental and somatic dysfunction of lumbar region: Secondary | ICD-10-CM | POA: Diagnosis not present

## 2019-08-04 DIAGNOSIS — I10 Essential (primary) hypertension: Secondary | ICD-10-CM | POA: Diagnosis not present

## 2019-08-04 DIAGNOSIS — E7800 Pure hypercholesterolemia, unspecified: Secondary | ICD-10-CM | POA: Diagnosis not present

## 2019-08-04 DIAGNOSIS — E1165 Type 2 diabetes mellitus with hyperglycemia: Secondary | ICD-10-CM | POA: Diagnosis not present

## 2019-10-10 DIAGNOSIS — B029 Zoster without complications: Secondary | ICD-10-CM | POA: Diagnosis not present

## 2019-11-06 DIAGNOSIS — R1310 Dysphagia, unspecified: Secondary | ICD-10-CM | POA: Diagnosis not present

## 2019-11-06 DIAGNOSIS — Z6832 Body mass index (BMI) 32.0-32.9, adult: Secondary | ICD-10-CM | POA: Diagnosis not present

## 2019-11-06 DIAGNOSIS — I25119 Atherosclerotic heart disease of native coronary artery with unspecified angina pectoris: Secondary | ICD-10-CM | POA: Diagnosis not present

## 2019-11-06 DIAGNOSIS — E1165 Type 2 diabetes mellitus with hyperglycemia: Secondary | ICD-10-CM | POA: Diagnosis not present

## 2019-11-06 DIAGNOSIS — I10 Essential (primary) hypertension: Secondary | ICD-10-CM | POA: Diagnosis not present

## 2019-11-30 ENCOUNTER — Inpatient Hospital Stay (HOSPITAL_COMMUNITY)
Admission: EM | Admit: 2019-11-30 | Discharge: 2019-12-03 | DRG: 376 | Disposition: A | Discharge status: Home / Self Care | Payer: Medicare HMO | Attending: Internal Medicine | Admitting: Internal Medicine

## 2019-11-30 ENCOUNTER — Emergency Department (HOSPITAL_COMMUNITY): Payer: Medicare HMO

## 2019-11-30 ENCOUNTER — Encounter (HOSPITAL_COMMUNITY): Payer: Self-pay

## 2019-11-30 ENCOUNTER — Other Ambulatory Visit: Payer: Self-pay

## 2019-11-30 DIAGNOSIS — E669 Obesity, unspecified: Secondary | ICD-10-CM | POA: Diagnosis present

## 2019-11-30 DIAGNOSIS — E860 Dehydration: Secondary | ICD-10-CM | POA: Diagnosis present

## 2019-11-30 DIAGNOSIS — K449 Diaphragmatic hernia without obstruction or gangrene: Secondary | ICD-10-CM | POA: Diagnosis present

## 2019-11-30 DIAGNOSIS — T18128A Food in esophagus causing other injury, initial encounter: Secondary | ICD-10-CM | POA: Diagnosis present

## 2019-11-30 DIAGNOSIS — Z6829 Body mass index (BMI) 29.0-29.9, adult: Secondary | ICD-10-CM

## 2019-11-30 DIAGNOSIS — D72829 Elevated white blood cell count, unspecified: Secondary | ICD-10-CM | POA: Diagnosis not present

## 2019-11-30 DIAGNOSIS — Z7984 Long term (current) use of oral hypoglycemic drugs: Secondary | ICD-10-CM

## 2019-11-30 DIAGNOSIS — Z7902 Long term (current) use of antithrombotics/antiplatelets: Secondary | ICD-10-CM

## 2019-11-30 DIAGNOSIS — E119 Type 2 diabetes mellitus without complications: Secondary | ICD-10-CM | POA: Diagnosis present

## 2019-11-30 DIAGNOSIS — Z800 Family history of malignant neoplasm of digestive organs: Secondary | ICD-10-CM

## 2019-11-30 DIAGNOSIS — Z7289 Other problems related to lifestyle: Secondary | ICD-10-CM

## 2019-11-30 DIAGNOSIS — Z8249 Family history of ischemic heart disease and other diseases of the circulatory system: Secondary | ICD-10-CM

## 2019-11-30 DIAGNOSIS — N400 Enlarged prostate without lower urinary tract symptoms: Secondary | ICD-10-CM | POA: Diagnosis present

## 2019-11-30 DIAGNOSIS — R1310 Dysphagia, unspecified: Secondary | ICD-10-CM

## 2019-11-30 DIAGNOSIS — Z85118 Personal history of other malignant neoplasm of bronchus and lung: Secondary | ICD-10-CM

## 2019-11-30 DIAGNOSIS — X58XXXA Exposure to other specified factors, initial encounter: Secondary | ICD-10-CM | POA: Diagnosis present

## 2019-11-30 DIAGNOSIS — R918 Other nonspecific abnormal finding of lung field: Secondary | ICD-10-CM | POA: Diagnosis not present

## 2019-11-30 DIAGNOSIS — Z955 Presence of coronary angioplasty implant and graft: Secondary | ICD-10-CM

## 2019-11-30 DIAGNOSIS — C155 Malignant neoplasm of lower third of esophagus: Secondary | ICD-10-CM | POA: Diagnosis not present

## 2019-11-30 DIAGNOSIS — E785 Hyperlipidemia, unspecified: Secondary | ICD-10-CM | POA: Diagnosis not present

## 2019-11-30 DIAGNOSIS — R251 Tremor, unspecified: Secondary | ICD-10-CM | POA: Diagnosis present

## 2019-11-30 DIAGNOSIS — I2510 Atherosclerotic heart disease of native coronary artery without angina pectoris: Secondary | ICD-10-CM | POA: Diagnosis present

## 2019-11-30 DIAGNOSIS — R079 Chest pain, unspecified: Secondary | ICD-10-CM | POA: Diagnosis not present

## 2019-11-30 DIAGNOSIS — Z79899 Other long term (current) drug therapy: Secondary | ICD-10-CM

## 2019-11-30 DIAGNOSIS — N401 Enlarged prostate with lower urinary tract symptoms: Secondary | ICD-10-CM | POA: Diagnosis present

## 2019-11-30 DIAGNOSIS — K219 Gastro-esophageal reflux disease without esophagitis: Secondary | ICD-10-CM | POA: Diagnosis present

## 2019-11-30 DIAGNOSIS — R590 Localized enlarged lymph nodes: Secondary | ICD-10-CM | POA: Diagnosis present

## 2019-11-30 DIAGNOSIS — R634 Abnormal weight loss: Secondary | ICD-10-CM | POA: Diagnosis present

## 2019-11-30 DIAGNOSIS — Z7982 Long term (current) use of aspirin: Secondary | ICD-10-CM

## 2019-11-30 DIAGNOSIS — R1314 Dysphagia, pharyngoesophageal phase: Secondary | ICD-10-CM | POA: Diagnosis present

## 2019-11-30 DIAGNOSIS — C159 Malignant neoplasm of esophagus, unspecified: Secondary | ICD-10-CM

## 2019-11-30 DIAGNOSIS — I10 Essential (primary) hypertension: Secondary | ICD-10-CM | POA: Diagnosis present

## 2019-11-30 DIAGNOSIS — K222 Esophageal obstruction: Secondary | ICD-10-CM | POA: Diagnosis present

## 2019-11-30 DIAGNOSIS — I252 Old myocardial infarction: Secondary | ICD-10-CM

## 2019-11-30 DIAGNOSIS — Z87891 Personal history of nicotine dependence: Secondary | ICD-10-CM

## 2019-11-30 DIAGNOSIS — Z20828 Contact with and (suspected) exposure to other viral communicable diseases: Secondary | ICD-10-CM | POA: Diagnosis present

## 2019-11-30 DIAGNOSIS — Z833 Family history of diabetes mellitus: Secondary | ICD-10-CM

## 2019-11-30 DIAGNOSIS — R350 Frequency of micturition: Secondary | ICD-10-CM | POA: Diagnosis present

## 2019-11-30 LAB — COMPREHENSIVE METABOLIC PANEL
ALT: 19 U/L (ref 0–44)
AST: 16 U/L (ref 15–41)
Albumin: 3.7 g/dL (ref 3.5–5.0)
Alkaline Phosphatase: 38 U/L (ref 38–126)
Anion gap: 12 (ref 5–15)
BUN: 9 mg/dL (ref 8–23)
CO2: 25 mmol/L (ref 22–32)
Calcium: 9.1 mg/dL (ref 8.9–10.3)
Chloride: 104 mmol/L (ref 98–111)
Creatinine, Ser: 0.81 mg/dL (ref 0.61–1.24)
GFR calc Af Amer: >60 mL/min (ref >60)
GFR calc non Af Amer: >60 mL/min (ref >60)
Glucose, Bld: 124 mg/dL — ABNORMAL HIGH (ref 70–99)
Potassium: 4.1 mmol/L (ref 3.5–5.1)
Sodium: 141 mmol/L (ref 135–145)
Total Bilirubin: 1.2 mg/dL (ref 0.3–1.2)
Total Protein: 6.7 g/dL (ref 6.5–8.1)

## 2019-11-30 LAB — CBC WITH DIFFERENTIAL/PLATELET
Abs Immature Granulocytes: 0.05 10*3/uL (ref 0.00–0.07)
Basophils Absolute: 0 10*3/uL (ref 0.0–0.1)
Basophils Relative: 0 %
Eosinophils Absolute: 0 10*3/uL (ref 0.0–0.5)
Eosinophils Relative: 0 %
HCT: 43.6 % (ref 39.0–52.0)
Hemoglobin: 14.4 g/dL (ref 13.0–17.0)
Immature Granulocytes: 0 %
Lymphocytes Relative: 5 %
Lymphs Abs: 0.7 10*3/uL (ref 0.7–4.0)
MCH: 31.4 pg (ref 26.0–34.0)
MCHC: 33 g/dL (ref 30.0–36.0)
MCV: 95.2 fL (ref 80.0–100.0)
Monocytes Absolute: 1 10*3/uL (ref 0.1–1.0)
Monocytes Relative: 7 %
Neutro Abs: 11.5 10*3/uL — ABNORMAL HIGH (ref 1.7–7.7)
Neutrophils Relative %: 88 %
Platelets: 275 10*3/uL (ref 150–400)
RBC: 4.58 MIL/uL (ref 4.22–5.81)
RDW: 13.2 % (ref 11.5–15.5)
WBC: 13.2 10*3/uL — ABNORMAL HIGH (ref 4.0–10.5)
nRBC: 0 % (ref 0.0–0.2)

## 2019-11-30 LAB — URINALYSIS, ROUTINE W REFLEX MICROSCOPIC
Bilirubin Urine: NEGATIVE
Glucose, UA: NEGATIVE mg/dL
Ketones, ur: 80 mg/dL — AB
Leukocytes,Ua: NEGATIVE
Nitrite: NEGATIVE
Protein, ur: 30 mg/dL — AB
Specific Gravity, Urine: 1.02 (ref 1.005–1.030)
pH: 5 (ref 5.0–8.0)

## 2019-11-30 LAB — LIPASE, BLOOD: Lipase: 18 U/L (ref 11–51)

## 2019-11-30 LAB — RESPIRATORY PANEL BY RT PCR (FLU A&B, COVID)
Influenza A by PCR: NEGATIVE
Influenza B by PCR: NEGATIVE
SARS Coronavirus 2 by RT PCR: NEGATIVE

## 2019-11-30 LAB — CBG MONITORING, ED: Glucose-Capillary: 84 mg/dL (ref 70–99)

## 2019-11-30 MED ORDER — ONDANSETRON HCL 4 MG/2ML IJ SOLN: 4.0000 mg | Freq: Four times a day (QID) | INTRAMUSCULAR | Status: DC | PRN

## 2019-11-30 MED ORDER — SODIUM CHLORIDE 0.9 % IV SOLN: INTRAVENOUS | Status: DC

## 2019-11-30 MED ORDER — SODIUM CHLORIDE 0.9 % IV BOLUS
1000.0000 mL | Freq: Once | INTRAVENOUS | Status: AC
  Administered 2019-11-30: 12:00:00 1000 mL via INTRAVENOUS

## 2019-11-30 MED ORDER — ENOXAPARIN SODIUM 40 MG/0.4ML ~~LOC~~ SOLN
40.0000 mg | SUBCUTANEOUS | Status: DC
  Administered 2019-11-30 – 2019-12-02 (×2): 40 mg via SUBCUTANEOUS
  Filled 2019-11-30 (×2): qty 0.4

## 2019-11-30 MED ORDER — IOHEXOL 300 MG/ML  SOLN
100.0000 mL | Freq: Once | INTRAMUSCULAR | Status: AC | PRN
  Administered 2019-11-30: 75 mL via INTRAVENOUS

## 2019-11-30 MED ORDER — FAMOTIDINE IN NACL 20-0.9 MG/50ML-% IV SOLN
20.0000 mg | INTRAVENOUS | Status: DC
  Administered 2019-11-30 – 2019-12-02 (×2): 20 mg via INTRAVENOUS
  Filled 2019-11-30 (×2): qty 50

## 2019-11-30 NOTE — H&P (Signed)
History and Physical    Timothy Oconnor [id number] DOB: 05-16-1951 DOA: 11/30/2019  Referring MD/NP/PA: Dr. Noemi Chapel PCP: [name], MD  Patient coming from: Home  Chief Complaint: Dysphagia  HPI: Timothy Oconnor is a 68 y.o. male with a past medical history significant for diabetes, heart disease/CAD, BPH and high cholesterol; who presented to the emergency department secondary to ongoing dysphagia.  Patient reports symptom has been present approximately for 43-month and progressing.  Was having difficulty with solid food but now having trouble with liquids and even his pills.  He reported having sensation of food or liquids getting stuck in the middle of his chest with subsequent development of nausea and vomiting.  For the last week prior to admission patient has not been able to eat anything and expressed an overall loss of 7 pounds.  Patient was seen by PCP with outpatient follow-up scheduled with GI for December 31, 2019; unfortunately given worsening symptoms patient presented to the ED for further evaluation and management.  Patient denies fever, chills, difficulty breathing, cough, contact with any known COVID-19 patient, hematuria, dysuria, melena, hematochezia, abdominal pain or any other complaints.  Work-up demonstrated mild elevation in his WBCs and otherwise stable results. CT scan of the chest demonstrated:  1-Relatively focal, asymmetric wall thickening of the distal esophagus raising concerns for mass lesion.  There is some minimal fluid/debris is in the esophageal lumen proximal to the distal wall thickening.   2-Ill-defined tree-in-bud nodularity in all lobes of both lungs some of the nodular components measuring up to 10 mm. Distribution favors atypical infection including MAI although aspiration could have this appearance.  3-Mild mediastinal lymphadenopathy with upper normal lymph nodes in the gastrohepatic ligament. If the distal esophageal wall thickening represents  neoplasm, metastatic disease would be a concern. This could also be reactive to the bilateral lung disease.   Case was discussed with on-call gastroenterology coverage and [facility] contacted to admit patient for further evaluation and management.  Patient will ended requiring endoscopic evaluation on 12/01/2019.  Past Medical/Surgical History: Past Medical History:  Diagnosis Date  . Diabetes ([place])   . Heart attack ([place])   . Heart disease   . High cholesterol   . Hyperplasia of prostate   . Hypertension   . Low back pain   . Tremor     Past Surgical History:  Procedure Laterality Date  . arterectomy     1993  . Heart stents     2002, 2004, 2006    Social History:  reports that he has quit smoking. He has never used smokeless tobacco. He reports current alcohol use. He reports that he does not use drugs.  Allergies: No Known Allergies  Family History:  Family History  Problem Relation Age of Onset  . Alzheimer's disease Mother   . Diabetes Father   . Heart disease Father   . Stroke Father   . Heart attack Brother   . Colon cancer Brother     Prior to Admission medications   Medication Sig Start Date End Date Taking? Authorizing Provider  aspirin EC 81 MG tablet Take 81 mg by mouth.    [provider]  atenolol (TENORMIN) 25 MG tablet Take 25 mg by mouth. 12/18/11   [provider]  atorvastatin (LIPITOR) 80 MG tablet Take 80 mg by mouth. 12/25/11   [provider]  clopidogrel (PLAVIX) 75 MG tablet Take 75 mg by mouth. 12/02/11   [provider]  ezetimibe (ZETIA) 10 MG tablet  Take 10 mg by mouth. 12/25/11   [provider]  folic acid (FOLVITE) Q000111Q MCG tablet Take 800 mcg by mouth.    [provider]  Glucosamine HCl (GLUCOSAMINE PO) Take 120 mg by mouth 2 (two) times daily.    [provider]  losartan (COZAAR) 50 MG tablet Take 25 mg by mouth. 12/02/11   [provider]  metFORMIN (GLUMETZA) 500 MG  (MOD) 24 hr tablet Take 500 mg by mouth daily with breakfast.    [provider]  Multiple Vitamin (MULTI-VITAMINS) TABS Take 1 tablet by mouth.    [provider]  Omega-3 Fatty Acids (FISH OIL) 1000 MG CAPS Take by mouth 2 (two) times daily.    [provider]  Saw Palmetto 450 MG CAPS Take by mouth.    [provider]  tamsulosin (FLOMAX) 0.4 MG CAPS capsule Take 0.4 mg by mouth. 12/02/11   [provider]    Review of Systems:  Negative except as otherwise mentioned in HPI.  Physical Exam: Vitals:   11/30/19 1118 11/30/19 1119  BP: 117/85   Pulse: 87   Resp: 16   Temp: 99.2 F (37.3 C)   TempSrc: Oral   SpO2: 95%   Weight:  93.9 kg  Height:  5\' 10"  (1.778 m)   Constitutional: NAD, calm, comfortable; afebrile, able to speak in full sentences, denying shortness of breath, fever, chills or any other complaints.  Patient protecting airways properly and able to swallow his own secretions. Eyes: PERRL, lids and conjunctivae normal, no icterus, no nystagmus. ENMT: Mucous membranes mildly dry on exam. Posterior pharynx clear of any exudate or lesions.  Neck: normal, supple, no masses, no thyromegaly, no JVD Respiratory: clear to auscultation bilaterally, no wheezing, no crackles. Normal respiratory effort. No accessory muscle use.  Cardiovascular: Regular rate and rhythm, no murmurs / rubs / gallops. No extremity edema. 2+ pedal pulses. No carotid bruits.  Abdomen: no tenderness, no masses palpated. No hepatosplenomegaly. Bowel sounds positive.  Musculoskeletal: no clubbing / cyanosis. No joint deformity upper and lower extremities. Good ROM, no contractures. Normal muscle tone.  Skin: no rashes, lesions, ulcers. No induration Neurologic: CN 2-12 grossly intact. Sensation intact, DTR normal. Strength 5/5 in all 4.  Psychiatric: Normal judgment and insight. Alert and oriented x 3. Normal mood.    Labs on Admission: I have personally reviewed  the following labs and imaging studies  CBC: Recent Labs  Lab 11/30/19 1158  WBC 13.2*  NEUTROABS 11.5*  HGB 14.4  HCT 43.6  MCV 95.2  PLT 123XX123   Basic Metabolic Panel: Recent Labs  Lab 11/30/19 1158  NA 141  K 4.1  CL 104  CO2 25  GLUCOSE 124*  BUN 9  CREATININE 0.81  CALCIUM 9.1   GFR: Estimated Creatinine Clearance: 97.7 mL/min (by C-G formula based on SCr of 0.81 mg/dL).   Liver Function Tests: Recent Labs  Lab 11/30/19 1158  AST 16  ALT 19  ALKPHOS 38  BILITOT 1.2  PROT 6.7  ALBUMIN 3.7   Recent Labs  Lab 11/30/19 1158  LIPASE 18   Urine analysis:    Component Value Date/Time   COLORURINE YELLOW 11/30/2019 1319   APPEARANCEUR HAZY (A) 11/30/2019 1319   LABSPEC 1.020 11/30/2019 1319   PHURINE 5.0 11/30/2019 1319   GLUCOSEU NEGATIVE 11/30/2019 1319   HGBUR MODERATE (A) 11/30/2019 1319   BILIRUBINUR NEGATIVE 11/30/2019 1319   KETONESUR 80 (A) 11/30/2019 1319   PROTEINUR 30 (A) 11/30/2019  [place] 11/30/2019 1319   LEUKOCYTESUR NEGATIVE 11/30/2019 1319    Recent Results (from the past 240 hour(s))  Respiratory Panel by RT PCR (Flu A&B, Covid) - Nasopharyngeal Swab     Status: None   Collection Time: 11/30/19 12:13 PM   Specimen: Nasopharyngeal Swab  Result Value Ref Range Status   SARS Coronavirus 2 by RT PCR NEGATIVE NEGATIVE Final    Comment: (NOTE) SARS-CoV-2 target nucleic acids are NOT DETECTED. The SARS-CoV-2 RNA is generally detectable in upper respiratoy specimens during the acute phase of infection. The lowest concentration of SARS-CoV-2 viral copies this assay can detect is 131 copies/mL. A negative result does not preclude SARS-Cov-2 infection and should not be used as the sole basis for treatment or other patient management decisions. A negative result may occur with  improper specimen collection/handling, submission of specimen other than nasopharyngeal swab, presence of viral mutation(s) within the areas  targeted by this assay, and inadequate number of viral copies (<131 copies/mL). A negative result must be combined with clinical observations, patient history, and epidemiological information. The expected result is Negative. Fact Sheet for Patients:  PinkCheek.be Fact Sheet for Healthcare Providers:  GravelBags.it This test is not yet ap proved or cleared by the [place] FDA and  has been authorized for detection and/or diagnosis of SARS-CoV-2 by FDA under an Emergency Use Authorization (EUA). This EUA will remain  in effect (meaning this test can be used) for the duration of the COVID-19 declaration under Section 564(b)(1) of the Act, 21 U.S.C. section 360bbb-3(b)(1), unless the authorization is terminated or revoked sooner.    Influenza A by PCR NEGATIVE NEGATIVE Final   Influenza B by PCR NEGATIVE NEGATIVE Final    Comment: (NOTE) The Xpert Xpress SARS-CoV-2/FLU/RSV assay is intended as an aid in  the diagnosis of influenza from Nasopharyngeal swab specimens and  should not be used as a sole basis for treatment. Nasal washings and  aspirates are unacceptable for Xpert Xpress SARS-CoV-2/FLU/RSV  testing. Fact Sheet for Patients: PinkCheek.be Fact Sheet for Healthcare Providers: GravelBags.it This test is not yet approved or cleared by the [place] FDA and  has been authorized for detection and/or diagnosis of SARS-CoV-2 by  FDA under an Emergency Use Authorization (EUA). This EUA will remain  in effect (meaning this test can be used) for the duration of the  Covid-19 declaration under Section 564(b)(1) of the Act, 21  U.S.C. section 360bbb-3(b)(1), unless the authorization is  terminated or revoked. Performed at [facility], [address], [place], [place] [address]      Radiological Exams on Admission: CT Chest W Contrast  Result Date:  11/30/2019 CLINICAL DATA:  2 month history of difficulty swallowing. Mid chest pain with swallowing. EXAM: CT CHEST WITH CONTRAST TECHNIQUE: Multidetector CT imaging of the chest was performed during intravenous contrast administration. CONTRAST:  68mL OMNIPAQUE IOHEXOL 300 MG/ML  SOLN COMPARISON:  None. FINDINGS: Cardiovascular: The heart size is normal. No substantial pericardial effusion. Coronary artery calcification is evident. Atherosclerotic calcification is noted in the wall of the thoracic aorta. Mediastinum/Nodes: 15 mm short axis AP window lymph node is associated with 18 mm short axis subcarinal node. 9 mm short axis retroesophageal node visible on image 95/2. Asymmetric relatively focal wall thickening is noted in the distal esophagus (image 114/series 2) with several small distal paraesophageal and gastrohepatic ligament lymph nodes evident. 12 mm short axis paraspinal node identified adjacent to the descending aorta on 01/30 9/2. Lungs/Pleura: Areas of  clustered ill-defined tree-in-bud nodularity are seen in the upper and lower lobes bilaterally. These nodular components measure up to 10 mm in the right lower lobe (97/4) no pleural effusion. Upper Abdomen: 2.6 cm cyst identified upper pole left kidney. As noted above, upper normal lymph nodes are seen in the gastrohepatic ligament. Musculoskeletal: No worrisome lytic or sclerotic osseous abnormality. IMPRESSION: 1. Relatively focal, asymmetric wall thickening distal esophagus raises concern for mass lesion. There is some minimal fluid/debris in the esophageal lumen proximal to the distal wall thickening. 2. Ill-defined tree-in-bud nodularity in all lobes of both lungs some of the nodular components measuring up to 10 mm. Distribution favors atypical infection including MAI although aspiration could have this appearance. 3. Mild mediastinal lymphadenopathy with upper normal lymph nodes in the gastrohepatic ligament. If the distal esophageal wall  thickening represents neoplasm, metastatic disease would be a concern. This could also be reactive to the bilateral lung disease. Electronically Signed   By: Misty Stanley M.D.   On: 11/30/2019 13:59    EKG: None  Assessment/Plan 1-dysphagia -Progressive and with CT scan concerning for esophageal mass -Other differential include esophageal candidiasis, eosinophilic esophagitis or further uncontrolled reflux esophagitis with a stricture and food impaction. -Patient will be kept n.p.o. -Gentle fluid resuscitation -Hold oral medications -IV Pepcid -As needed antiemetics -GI service has been consulted and will follow recommendations.  2-essential hypertension -Appears to be stable and well-controlled currently -In the setting of inability to tolerate oral medications we will hold all antihypertensive agents -Follow vital signs and if needed provide as needed IV medication.  3-BPH -No symptoms currently -Holding oral medications initially. -Resume the use of Flomax when tolerating p.o.'s  4-hyperlipidemia -Holding Zetia and Lipitor -Resume when able to tolerate by mouth medications.  5-history of coronary artery disease -Will resume the use of Imdur, metoprolol, Cozaar, statins, aspirin and Plavix ventilated by mouth -Patient denies chest pain or shortness of breath.  6-history of type 2 diabetes -Hold oral hypoglycemic agents while n.p.o. -Follow CBGs. Currently 124  DVT prophylaxis: lovenox. Code Status: Full code Family Communication: no family at bedside  Disposition Plan: Discharge home once GI evaluation completed.   Consults called:  GI service Admission status: Observation, med-surg bed, LOS < 2 midnights.     Time Spent: 60 minutes  Barton Dubois MD [facility] Pager [phone]  11/30/2019, 2:42 PM

## 2019-11-30 NOTE — ED Provider Notes (Signed)
[facility] EMERGENCY DEPARTMENT Provider Note   CSN: [id number] Arrival date & time: 11/30/19  1104     History Chief Complaint  Patient presents with  . Dysphagia    Timothy Oconnor is a 68 y.o. male.  The history is provided by the patient and medical records. No language interpreter was used.       68 year old male with history of diabetes, cardiac disease, hypercholesterolemia, hypertension, presenting to the ED accompanied by family member for evaluation of difficulty swallowing.  Patient report for the past several months he has had increased difficulty swallowing.  States that he would experience discomfort to his mid to lower chest and epigastric region after eating and described the discomfort as an indigestion feeling.  On occasion he would vomit up unprocessed food.  For the past few weeks his symptoms has become progressively more uncomfortable, which include both liquid and solid.  Today while drinking fluid he was unable to keep down the fluid prompting this ER visit.  Patient report he has lost approximately 7 pounds within the past week due to not able to eat and drink.  He feels weak.  He does not have any active pain at this time.  Does not complain of any fever chills no chest pain shortness of breath productive cough diarrhea constipation.  He denies any focal numbness or focal weakness.  He has been seen by his PCP and did have an appointment with a GI specialist on January 27, a month from now, however patient does not think he can wait that long.  He mention having a colonoscopy approximately 3 years ago that was unremarkable.  He has never had an endoscopy in the past.   Past Medical History:  Diagnosis Date  . Diabetes ([place])   . Heart attack ([place])   . Heart disease   . High cholesterol   . Hyperplasia of prostate   . Hypertension   . Low back pain   . Tremor     Patient Active Problem List   Diagnosis Date Noted  . Tremor 12/08/2015    Past Surgical  History:  Procedure Laterality Date  . arterectomy     1993  . Heart stents     2002, 2004, 2006       Family History  Problem Relation Age of Onset  . Alzheimer's disease Mother   . Diabetes Father   . Heart disease Father   . Stroke Father   . Heart attack Brother   . Colon cancer Brother     Social History   Tobacco Use  . Smoking status: Former Research scientist (life sciences)  . Smokeless tobacco: Never Used  . Tobacco comment: Quit 2004  Substance Use Topics  . Alcohol use: Yes    Alcohol/week: 0.0 standard drinks    Comment: 3 drinks per week  . Drug use: No    Home Medications Prior to Admission medications   Medication Sig Start Date End Date Taking? Authorizing Provider  aspirin EC 81 MG tablet Take 81 mg by mouth.    [provider]  atenolol (TENORMIN) 25 MG tablet Take 25 mg by mouth. 12/18/11   [provider]  atorvastatin (LIPITOR) 80 MG tablet Take 80 mg by mouth. 12/25/11   [provider]  clopidogrel (PLAVIX) 75 MG tablet Take 75 mg by mouth. 12/02/11   [provider]  ezetimibe (ZETIA) 10 MG tablet Take 10 mg by mouth. 12/25/11   [provider]  folic acid (FOLVITE) Q000111Q  MCG tablet Take 800 mcg by mouth.    [provider]  Glucosamine HCl (GLUCOSAMINE PO) Take 120 mg by mouth 2 (two) times daily.    [provider]  losartan (COZAAR) 50 MG tablet Take 25 mg by mouth. 12/02/11   [provider]  metFORMIN (GLUMETZA) 500 MG (MOD) 24 hr tablet Take 500 mg by mouth daily with breakfast.    [provider]  Multiple Vitamin (MULTI-VITAMINS) TABS Take 1 tablet by mouth.    [provider]  Omega-3 Fatty Acids (FISH OIL) 1000 MG CAPS Take by mouth 2 (two) times daily.    [provider]  Saw Palmetto 450 MG CAPS Take by mouth.    [provider]  tamsulosin (FLOMAX) 0.4 MG CAPS capsule Take 0.4 mg by mouth. 12/02/11   [provider]    Allergies    Patient has  no known allergies.  Review of Systems   Review of Systems  All other systems reviewed and are negative.   Physical Exam Updated Vital Signs BP 117/85 (BP Location: Right Arm)   Pulse 87   Temp 99.2 F (37.3 C) (Oral)   Resp 16   Ht 5\' 10"  (1.778 m)   Wt 93.9 kg   SpO2 95%   BMI 29.70 kg/m   Physical Exam Vitals and nursing note reviewed.  Constitutional:      General: He is not in acute distress.    Appearance: He is well-developed.  HENT:     Head: Atraumatic.     Mouth/Throat:     Mouth: Mucous membranes are moist.  Eyes:     Conjunctiva/sclera: Conjunctivae normal.  Cardiovascular:     Rate and Rhythm: Normal rate and regular rhythm.     Heart sounds: Murmur present.  Pulmonary:     Effort: Pulmonary effort is normal.     Breath sounds: Normal breath sounds.  Abdominal:     Palpations: Abdomen is soft.     Tenderness: There is abdominal tenderness (Very mild epigastric tenderness no guarding or rebound tenderness.).  Musculoskeletal:     Cervical back: Neck supple.  Skin:    Findings: No rash.  Neurological:     Mental Status: He is alert and oriented to person, place, and time.  Psychiatric:        Mood and Affect: Mood normal.     ED Results / Procedures / Treatments   Labs (all labs ordered are listed, but only abnormal results are displayed) Labs Reviewed  CBC WITH DIFFERENTIAL/PLATELET - Abnormal; Notable for the following components:      Result Value   WBC 13.2 (*)    Neutro Abs 11.5 (*)    All other components within normal limits  COMPREHENSIVE METABOLIC PANEL - Abnormal; Notable for the following components:   Glucose, Bld 124 (*)    All other components within normal limits  URINALYSIS, ROUTINE W REFLEX MICROSCOPIC - Abnormal; Notable for the following components:   APPearance HAZY (*)    Hgb urine dipstick MODERATE (*)    Ketones, ur 80 (*)    Protein, ur 30 (*)    Bacteria, UA RARE (*)    All other components within normal limits    RESPIRATORY PANEL BY RT PCR (FLU A&B, COVID)  LIPASE, BLOOD    EKG None  Radiology CT Chest W Contrast  Result Date: 11/30/2019 CLINICAL DATA:  2 month history of difficulty swallowing. Mid chest pain with swallowing. EXAM: CT CHEST WITH CONTRAST  TECHNIQUE: Multidetector CT imaging of the chest was performed during intravenous contrast administration. CONTRAST:  40mL OMNIPAQUE IOHEXOL 300 MG/ML  SOLN COMPARISON:  None. FINDINGS: Cardiovascular: The heart size is normal. No substantial pericardial effusion. Coronary artery calcification is evident. Atherosclerotic calcification is noted in the wall of the thoracic aorta. Mediastinum/Nodes: 15 mm short axis AP window lymph node is associated with 18 mm short axis subcarinal node. 9 mm short axis retroesophageal node visible on image 95/2. Asymmetric relatively focal wall thickening is noted in the distal esophagus (image 114/series 2) with several small distal paraesophageal and gastrohepatic ligament lymph nodes evident. 12 mm short axis paraspinal node identified adjacent to the descending aorta on 01/30 9/2. Lungs/Pleura: Areas of clustered ill-defined tree-in-bud nodularity are seen in the upper and lower lobes bilaterally. These nodular components measure up to 10 mm in the right lower lobe (97/4) no pleural effusion. Upper Abdomen: 2.6 cm cyst identified upper pole left kidney. As noted above, upper normal lymph nodes are seen in the gastrohepatic ligament. Musculoskeletal: No worrisome lytic or sclerotic osseous abnormality. IMPRESSION: 1. Relatively focal, asymmetric wall thickening distal esophagus raises concern for mass lesion. There is some minimal fluid/debris in the esophageal lumen proximal to the distal wall thickening. 2. Ill-defined tree-in-bud nodularity in all lobes of both lungs some of the nodular components measuring up to 10 mm. Distribution favors atypical infection including MAI although aspiration could have this appearance. 3.  Mild mediastinal lymphadenopathy with upper normal lymph nodes in the gastrohepatic ligament. If the distal esophageal wall thickening represents neoplasm, metastatic disease would be a concern. This could also be reactive to the bilateral lung disease. Electronically Signed   By: Misty Stanley M.D.   On: 11/30/2019 13:59    Procedures Procedures (including critical care time)  Medications Ordered in ED Medications  sodium chloride 0.9 % bolus 1,000 mL (0 mLs Intravenous Stopped 11/30/19 1319)  iohexol (OMNIPAQUE) 300 MG/ML solution 100 mL (75 mLs Intravenous Contrast Given 11/30/19 1333)    ED Course  I have reviewed the triage vital signs and the nursing notes.  Pertinent labs & imaging results that were available during my care of the patient were reviewed by me and considered in my medical decision making (see chart for details).    MDM Rules/Calculators/A&P                      BP 117/85 (BP Location: Right Arm)   Pulse 87   Temp 99.2 F (37.3 C) (Oral)   Resp 16   Ht 5\' 10"  (1.778 m)   Wt 93.9 kg   SpO2 95%   BMI 29.70 kg/m   Final Clinical Impression(s) / ED Diagnoses Final diagnoses:  Dysphagia, unspecified type    Rx / DC Orders ED Discharge Orders    None     11:46 AM Patient with ongoing dysphagia for the past 2 months now unable to tolerate liquid who is here with symptoms suggestive of lower esophageal stricture.  He endorsed weight loss due to not eating much for the past few days.  Able to tolerates his secretion. Currently low suspicion for food impaction. We will check basic labs and give IV fluid.  Will benefit from a swallow screen study and will need to consult with GI specialist for endoscopy. Care discussed with Dr. Sabra Heck.   Will obtain Chest CT to assess for potential malignancy causing esophageal obstruction.  Will consult GI for endoscopy.   12:21 PM Appreciate consultation  from on-call GI specialist, Dr. Arnoldo Morale, who request medicine  admission, made patient n.p.o., to give GI a formal consult and anticipate endoscopy tomorrow by Dr. Vincente Liberty.  2:35 PM  UA shows 80 ketones consistent with patient states of dehydration from not eating and drinking much.  Patient is receiving IV fluid.  COVID-19 test is negative.  White count is mildly elevated at 13.2.  Electrolyte panels are reassuring.  Chest CT with contrast shows focal asymmetric wall thickening distal esophagus raise concern for mass lesion. Appreciate consultation from [facility] Dr. Dyann Kief who agrees to see and admit pt.  He will make formal GI consult.  Patient will need to be n.p.o. at midnight.  Patient voiced understanding and agrees with plan.  Timothy Oconnor was evaluated in Emergency Department on 11/30/2019 for the symptoms described in the history of present illness. He was evaluated in the context of the global COVID-19 pandemic, which necessitated consideration that the patient might be at risk for infection with the SARS-CoV-2 virus that causes COVID-19. Institutional protocols and algorithms that pertain to the evaluation of patients at risk for COVID-19 are in a state of rapid change based on information released by regulatory bodies including the CDC and federal and state organizations. These policies and algorithms were followed during the patient's care in the ED.    Domenic Moras, PA-C 11/30/19 1437    Noemi Chapel, MD 12/01/19 [phone]

## 2019-11-30 NOTE — ED Triage Notes (Signed)
Pt presents to ED with complaints of difficulty swallowing x 2 months. Pt states he has an appointment with GI on Jan 27th set by PCP. Pt states since Wednesday evening he has had more difficulty in getting pills and food to stay down. Pt states he has pain after he eats in his chest but eventually subsides after he vomits.

## 2019-12-01 ENCOUNTER — Observation Stay (HOSPITAL_COMMUNITY): Payer: Medicare HMO | Admitting: Anesthesiology

## 2019-12-01 ENCOUNTER — Encounter (HOSPITAL_COMMUNITY)
Admission: EM | Disposition: A | Discharge status: Home / Self Care | Payer: Self-pay | Source: Home / Self Care | Attending: Internal Medicine

## 2019-12-01 ENCOUNTER — Encounter (HOSPITAL_COMMUNITY): Payer: Self-pay | Admitting: Internal Medicine

## 2019-12-01 ENCOUNTER — Encounter: Payer: Self-pay | Admitting: Hematology

## 2019-12-01 DIAGNOSIS — R933 Abnormal findings on diagnostic imaging of other parts of digestive tract: Secondary | ICD-10-CM | POA: Diagnosis not present

## 2019-12-01 DIAGNOSIS — Z87891 Personal history of nicotine dependence: Secondary | ICD-10-CM | POA: Diagnosis not present

## 2019-12-01 DIAGNOSIS — R251 Tremor, unspecified: Secondary | ICD-10-CM | POA: Diagnosis present

## 2019-12-01 DIAGNOSIS — Z955 Presence of coronary angioplasty implant and graft: Secondary | ICD-10-CM | POA: Diagnosis not present

## 2019-12-01 DIAGNOSIS — R918 Other nonspecific abnormal finding of lung field: Secondary | ICD-10-CM | POA: Diagnosis not present

## 2019-12-01 DIAGNOSIS — R350 Frequency of micturition: Secondary | ICD-10-CM | POA: Diagnosis present

## 2019-12-01 DIAGNOSIS — E785 Hyperlipidemia, unspecified: Secondary | ICD-10-CM | POA: Diagnosis not present

## 2019-12-01 DIAGNOSIS — I2510 Atherosclerotic heart disease of native coronary artery without angina pectoris: Secondary | ICD-10-CM | POA: Diagnosis present

## 2019-12-01 DIAGNOSIS — K449 Diaphragmatic hernia without obstruction or gangrene: Secondary | ICD-10-CM | POA: Diagnosis not present

## 2019-12-01 DIAGNOSIS — K222 Esophageal obstruction: Secondary | ICD-10-CM | POA: Diagnosis not present

## 2019-12-01 DIAGNOSIS — D72829 Elevated white blood cell count, unspecified: Secondary | ICD-10-CM | POA: Diagnosis not present

## 2019-12-01 DIAGNOSIS — K2270 Barrett's esophagus without dysplasia: Secondary | ICD-10-CM | POA: Diagnosis not present

## 2019-12-01 DIAGNOSIS — Z20828 Contact with and (suspected) exposure to other viral communicable diseases: Secondary | ICD-10-CM | POA: Diagnosis not present

## 2019-12-01 DIAGNOSIS — I10 Essential (primary) hypertension: Secondary | ICD-10-CM | POA: Diagnosis not present

## 2019-12-01 DIAGNOSIS — E860 Dehydration: Secondary | ICD-10-CM | POA: Diagnosis not present

## 2019-12-01 DIAGNOSIS — E119 Type 2 diabetes mellitus without complications: Secondary | ICD-10-CM | POA: Diagnosis not present

## 2019-12-01 DIAGNOSIS — Z8249 Family history of ischemic heart disease and other diseases of the circulatory system: Secondary | ICD-10-CM | POA: Diagnosis not present

## 2019-12-01 DIAGNOSIS — R634 Abnormal weight loss: Secondary | ICD-10-CM | POA: Diagnosis present

## 2019-12-01 DIAGNOSIS — X58XXXA Exposure to other specified factors, initial encounter: Secondary | ICD-10-CM | POA: Diagnosis not present

## 2019-12-01 DIAGNOSIS — C159 Malignant neoplasm of esophagus, unspecified: Secondary | ICD-10-CM | POA: Diagnosis not present

## 2019-12-01 DIAGNOSIS — R1310 Dysphagia, unspecified: Secondary | ICD-10-CM | POA: Diagnosis not present

## 2019-12-01 DIAGNOSIS — Z7902 Long term (current) use of antithrombotics/antiplatelets: Secondary | ICD-10-CM | POA: Diagnosis not present

## 2019-12-01 DIAGNOSIS — K219 Gastro-esophageal reflux disease without esophagitis: Secondary | ICD-10-CM | POA: Diagnosis present

## 2019-12-01 DIAGNOSIS — E669 Obesity, unspecified: Secondary | ICD-10-CM | POA: Diagnosis present

## 2019-12-01 DIAGNOSIS — I252 Old myocardial infarction: Secondary | ICD-10-CM | POA: Diagnosis not present

## 2019-12-01 DIAGNOSIS — T18128A Food in esophagus causing other injury, initial encounter: Secondary | ICD-10-CM | POA: Diagnosis not present

## 2019-12-01 DIAGNOSIS — N400 Enlarged prostate without lower urinary tract symptoms: Secondary | ICD-10-CM | POA: Diagnosis not present

## 2019-12-01 DIAGNOSIS — N401 Enlarged prostate with lower urinary tract symptoms: Secondary | ICD-10-CM | POA: Diagnosis not present

## 2019-12-01 DIAGNOSIS — C155 Malignant neoplasm of lower third of esophagus: Secondary | ICD-10-CM | POA: Diagnosis not present

## 2019-12-01 DIAGNOSIS — R1314 Dysphagia, pharyngoesophageal phase: Secondary | ICD-10-CM | POA: Diagnosis not present

## 2019-12-01 HISTORY — PX: BIOPSY: SHX5522

## 2019-12-01 HISTORY — PX: ESOPHAGOGASTRODUODENOSCOPY (EGD) WITH PROPOFOL: SHX5813

## 2019-12-01 LAB — GLUCOSE, CAPILLARY
Glucose-Capillary: 102 mg/dL — ABNORMAL HIGH (ref 70–99)
Glucose-Capillary: 108 mg/dL — ABNORMAL HIGH (ref 70–99)
Glucose-Capillary: 85 mg/dL (ref 70–99)

## 2019-12-01 LAB — CBG MONITORING, ED
Glucose-Capillary: 83 mg/dL (ref 70–99)
Glucose-Capillary: 79 mg/dL (ref 70–99)
Glucose-Capillary: 95 mg/dL (ref 70–99)

## 2019-12-01 SURGERY — ESOPHAGOGASTRODUODENOSCOPY (EGD) WITH PROPOFOL
Anesthesia: General

## 2019-12-01 MED ORDER — DEXTROSE-NACL 5-0.45 % IV SOLN: INTRAVENOUS | Status: DC

## 2019-12-01 MED ORDER — LACTATED RINGERS IV SOLN
Freq: Once | INTRAVENOUS | Status: AC
  Administered 2019-12-01: 1000 mL via INTRAVENOUS

## 2019-12-01 MED ORDER — GLYCOPYRROLATE PF 0.2 MG/ML IJ SOSY
PREFILLED_SYRINGE | INTRAMUSCULAR | Status: AC
  Filled 2019-12-01: qty 1

## 2019-12-01 MED ORDER — PROPOFOL 10 MG/ML IV BOLUS
INTRAVENOUS | Status: DC | PRN
  Administered 2019-12-01: 40 mg via INTRAVENOUS
  Administered 2019-12-01: 25 mg via INTRAVENOUS
  Administered 2019-12-01: 15 mg via INTRAVENOUS
  Administered 2019-12-01: 25 mg via INTRAVENOUS
  Administered 2019-12-01: 20 mg via INTRAVENOUS

## 2019-12-01 MED ORDER — LACTATED RINGERS IV SOLN: INTRAVENOUS | Status: DC | PRN

## 2019-12-01 MED ORDER — PROPOFOL 500 MG/50ML IV EMUL
INTRAVENOUS | Status: DC | PRN
  Administered 2019-12-01: 150 ug/kg/min via INTRAVENOUS

## 2019-12-01 MED ORDER — KETAMINE HCL 50 MG/5ML IJ SOSY
PREFILLED_SYRINGE | INTRAMUSCULAR | Status: AC
  Filled 2019-12-01: qty 5

## 2019-12-01 MED ORDER — SODIUM CHLORIDE 0.9 % IV SOLN: INTRAVENOUS | Status: DC

## 2019-12-01 MED ORDER — LABETALOL HCL 5 MG/ML IV SOLN: 5.0000 mg | INTRAVENOUS | Status: DC | PRN

## 2019-12-01 NOTE — Anesthesia Postprocedure Evaluation (Signed)
Anesthesia Post Note  Patient: Rolley Rosario  Procedure(s) Performed: ESOPHAGOGASTRODUODENOSCOPY (EGD) WITH PROPOFOL (N/A ) BIOPSY  Patient location during evaluation: PACU Anesthesia Type: General Level of consciousness: awake and alert and patient cooperative Pain management: satisfactory to patient Vital Signs Assessment: post-procedure vital signs reviewed and stable Respiratory status: spontaneous breathing Cardiovascular status: stable Postop Assessment: no apparent nausea or vomiting Anesthetic complications: no     Last Vitals:  Vitals:   12/01/19 1217 12/01/19 1350  BP: (!) 141/86 111/64  Pulse: 62 (!) 51  Resp: 20 (!) 21  Temp: 36.4 C (!) 36.4 C  SpO2: 97% 100%    Last Pain:  Vitals:   12/01/19 1350  TempSrc:   PainSc: 0-No pain                 Tyrae Alcoser

## 2019-12-01 NOTE — Consult Note (Signed)
GI Inpatient Consult Note  Reason for Consult: Dysphagia    Attending Requesting Consult: Dr. Dyann Kief  History of Present Illness: Timothy Oconnor is a 68 y.o. male seen for evaluation of dysphagia at the request of Dr. Dyann Kief.   Patient reports dysphagia beginning in October 2020, this initially began with large pills, throughout the past 2 months it has been progressive, to include solids and soft foods.  Over past 4 to 5 days has had issues with soups, liquids lodging in lower esophageal area.  This weekend began having sensation he was not able to pass most liquids. After swallowing develops lower chest pain when substances lodge. Last oral intake was 11/29/2019 in evening when he had small bite of shrimp and sausage ball and developed sensation after that he could not drink liquids, continued to not be able to tolerate liquids next morning which prompted emergency room evaluation.  He has chronic GERD issues that he has attributed to eating late at night and he uses Gaviscon and Rolaids as needed with good control in evening.  He is not on a daily PPI.  He denies any nausea/vomiting.  He does have some significant weight loss unintentional, reports he is down from #224 in October to #207 due to dysphagia.  He has no lower GI symptoms. Denies any abd pain.  He is moving his bowels well - denies any constipation, diarrhea, rectal bleeding.  He does report a limited course of black stools this past summer when he was taking heavy doses of Alka-Seltzer but has not used since and no dark stools in past several months.  He is on a baby aspirin but avoids other NSAID products.     Last Colonoscopy: Per patient done at [facility] approximately 3 years ago and denies any history of polyps. Last Endoscopy: None prior   Past Medical History:  Past Medical History:  Diagnosis Date  . Diabetes ([place])   . Heart attack ([place])   . Heart disease   . High cholesterol   . Hyperplasia of prostate   .  Hypertension   . Low back pain   . Tremor     Problem List: Patient Active Problem List   Diagnosis Date Noted  . Dysphagia 11/30/2019  . HTN (hypertension) 11/30/2019  . Type 2 diabetes mellitus without complication ([place]) [id number]  . HLD (hyperlipidemia) 11/30/2019  . BPH (benign prostatic hyperplasia) 11/30/2019  . Tremor 12/08/2015    Past Surgical History: Past Surgical History:  Procedure Laterality Date  . arterectomy     1993  . Heart stents     2002, 2004, 2006    Allergies: No Known Allergies  Home Medications: (Not in a hospital admission)  Home medication reconciliation was completed with the patient.   Scheduled Inpatient Medications:   . enoxaparin (LOVENOX) injection  40 mg Subcutaneous Q24H    Continuous Inpatient Infusions:   . dextrose 5 % and 0.45% NaCl 75 mL/hr at 12/01/19 0723  . famotidine (PEPCID) IV Stopped (11/30/19 1932)    PRN Inpatient Medications:  ondansetron (ZOFRAN) IV  Family History: family history includes Alzheimer's disease in his mother; Colon cancer in his brother; Diabetes in his father; Heart attack in his brother; Heart disease in his father; Stroke in his father.    Colon cancer in his brother was diagnosed in early 73s.  He denies any family history of esophageal or stomach cancer.  Social History:   reports that he has quit smoking. He has never used smokeless  tobacco. He reports current alcohol use. He reports that he does not use drugs.   Review of Systems: Constitutional: Positive weight loss, see HPI Eyes: No changes in vision. ENT: No oral lesions, sore throat.  GI: see HPI.  Heme/Lymph: No easy bruising.  CV: No chest pain.  GU: No hematuria.  Integumentary: No rashes.  Neuro: No headaches.  Psych: No depression/anxiety.  Endocrine: No heat/cold intolerance.  Allergic/Immunologic: No urticaria.  Resp: + mild cough Musculoskeletal: No joint swelling.    Physical Examination: BP (!) 141/80 (BP  Location: Right Arm)   Pulse (!) 55   Temp 98.6 F (37 C) (Oral)   Resp 16   Ht 5\' 10"  (1.778 m)   Wt 93.9 kg   SpO2 98%   BMI 29.70 kg/m  Gen: NAD, alert and oriented x 4 HEENT: PEERLA, EOMI, Neck: supple, no JVD or thyromegaly Chest: CTA bilaterally, no wheezes, crackles, or other adventitious sounds CV: RRR, no m/g/c/r Abd: soft, NT, ND, +BS in all four quadrants; no HSM, guarding, ridigity, or rebound tenderness Ext: no edema, well perfused with 2+ pulses, Skin: no rash or lesions noted Lymph: no LAD  Data: Lab Results  Component Value Date   WBC 13.2 (H) 11/30/2019   HGB 14.4 11/30/2019   HCT 43.6 11/30/2019   MCV 95.2 11/30/2019   PLT 275 11/30/2019   Recent Labs  Lab 11/30/19 1158  HGB 14.4   Lab Results  Component Value Date   NA 141 11/30/2019   K 4.1 11/30/2019   CL 104 11/30/2019   CO2 25 11/30/2019   BUN 9 11/30/2019   CREATININE 0.81 11/30/2019   Lab Results  Component Value Date   ALT 19 11/30/2019   AST 16 11/30/2019   ALKPHOS 38 11/30/2019   BILITOT 1.2 11/30/2019   No results for input(s): APTT, INR, PTT in the last 168 hours.  CT 11/30/19-IMPRESSION: 1. Relatively focal, asymmetric wall thickening distal esophagus raises concern for mass lesion. There is some minimal fluid/debris in the esophageal lumen proximal to the distal wall thickening. 2. Ill-defined tree-in-bud nodularity in all lobes of both lungs some of the nodular components measuring up to 10 mm. Distribution favors atypical infection including MAI although aspiration could have this appearance. 3. Mild mediastinal lymphadenopathy with upper normal lymph nodes in the gastrohepatic ligament. If the distal esophageal wall thickening represents neoplasm, metastatic disease would be a concern. This could also be reactive to the bilateral lung disease.   Assessment/Plan: Timothy Oconnor is a 68 y.o. male see for dysphagia.  1.  Dysphagia-with CT as above concerning for esophageal  mass.  He does have some significant unintentional weight loss as well. He has not had prior endoscopy.  He is currently n.p.o. awaiting endoscopy today. Further recs pending EGD findings.   He denies use of OTC NSAIDs except baby aspirin.  Has not been on Plavix since 2018 following a prior heart attack. He denies any issues in past with sedation.  Patient denies CP, SOB, and use of blood thinners. I discussed the risks and benefits of procedure including bleeding, perforation, infection, missed lesions, medication reactions and possible hospitalization or surgery if complications. All questions answered. Last lovenox dose 12/27 at 17:48  Case was discussed with Dr. Laural Golden. Thank you for the consult. Please call with questions or concerns.  Laurine Blazer, PA-C [facility]

## 2019-12-01 NOTE — Progress Notes (Signed)
PROGRESS NOTE    Timothy Oconnor  [id number] DOB: 1951/10/21 DOA: 11/30/2019 PCP: [name], MD     Brief Narrative:  68 y.o. male with a past medical history significant for diabetes, heart disease/CAD, BPH and high cholesterol; who presented to the emergency department secondary to ongoing dysphagia.  Patient reports symptom has been present approximately for 62-month and progressing.  Was having difficulty with solid food but now having trouble with liquids and even his pills.  He reported having sensation of food or liquids getting stuck in the middle of his chest with subsequent development of nausea and vomiting.  For the last week prior to admission patient has not been able to eat anything and expressed an overall loss of 7 pounds.  Patient was seen by PCP with outpatient follow-up scheduled with GI for December 31, 2019; unfortunately given worsening symptoms patient presented to the ED for further evaluation and management.  Patient denies fever, chills, difficulty breathing, cough, contact with any known COVID-19 patient, hematuria, dysuria, melena, hematochezia, abdominal pain or any other complaints.  Work-up demonstrated mild elevation in his WBCs and otherwise stable results. CT scan of the chest demonstrated:  1-Relatively focal, asymmetric wall thickening of the distal esophagus raising concerns for mass lesion.  There is some minimal fluid/debris is in the esophageal lumen proximal to the distal wall thickening.   2-Ill-defined tree-in-bud nodularity in all lobes of both lungs some of the nodular components measuring up to 10 mm. Distribution favors atypical infection including MAI although aspiration could have this appearance.  3-Mild mediastinal lymphadenopathy with upper normal lymph nodes in the gastrohepatic ligament. If the distal esophageal wall thickening represents neoplasm, metastatic disease would be a concern. This could also be reactive to the bilateral lung  disease.    Assessment & Plan: 1-Dysphagia -with CT findings concerning for malignancy -continue NPO status -follow EGD reports and GI recommendation. -continue IV fluid support and PRN antiemetics -base on early discussion with GI patient might ended requiring PEG tube and general surgery will be consulted.  2-HTN (hypertension) -Overall stable and well-controlled -As needed labetalol has been ordered. -Holding the rest of his antihypertensive regimen currently.  3-Type 2 diabetes mellitus without complication (HCC) -Borderline normal CBGs in the setting of n.p.o. status and previous extended release Metformin usage prior to admission. -IV fluids has been changed to D5 half-normal saline -Continue to follow CBGs every 4 hours while NPO.  4-HLD (hyperlipidemia) -Resume statins when able to tolerate p.o. or alternative route has been established.  5-BPH (benign prostatic hyperplasia) -Resume the use of Flomax once able to tolerate p.o. or alternative route has been established. -Patient denies urinary retention symptoms currently.  6-hx of CAD -will resume home meds when able to take PO's or alternative route established.   DVT prophylaxis: Lovenox Code Status: Full code Family Communication: no family at bedside; patient updated and all questions answered.  Disposition Plan: Patient remains in the hospital, continue fluid resuscitation, CBGs borderline and at this moment is still not able to tolerate p.o.'s.  Follow GI service and general surgery recommendation.  Consultants:   Gastroenterology service  General surgery  Procedures:   Anticipated EGD later today (12/01/2019  Antimicrobials:  Anti-infectives (From admission, onward)   None       Subjective: Afebrile, no chest pain, no shortness of breath, no nausea, no vomiting.  In no acute distress currently.  Ready and waiting for endoscopic evaluation.  Objective: Vitals:   12/01/19 1400 12/01/19 1415  12/01/19 1430  12/01/19 1554  BP: 122/75 129/87 136/81 (!) 157/85  Pulse: (!) 54 63 (!) 58 61  Resp: 20 18 (!) 26 16  Temp:    97.8 F (36.6 C)  TempSrc:    Oral  SpO2: 97% 96% 99% 99%  Weight:    95.6 kg  Height:    5\' 10"  (1.778 m)    Intake/Output Summary (Last 24 hours) at 12/01/2019 1631 Last data filed at 11/30/2019 1930 Gross per 24 hour  Intake 50 ml  Output --  Net 50 ml   Filed Weights   11/30/19 1119 12/01/19 1217 12/01/19 1554  Weight: 93.9 kg 93.9 kg 95.6 kg    Examination: General exam: Alert, awake, oriented x 3; in no acute distress; denies nausea or vomiting.  No chest pain, no shortness of breath.  Speaking in full sentences. Respiratory system: Clear to auscultation. Respiratory effort normal. Cardiovascular system:RRR. No murmurs, rubs, gallops. Gastrointestinal system: Abdomen is nondistended, soft and nontender. No organomegaly or masses felt. Normal bowel sounds heard. Central nervous system: Alert and oriented. No focal neurological deficits. Extremities: No cyanosis or clubbing; no edema. Skin: No rashes, no petechiae, no open wounds. Psychiatry: Judgement and insight appear normal. Mood & affect appropriate.     Data Reviewed: I have personally reviewed following labs and imaging studies  CBC: Recent Labs  Lab 11/30/19 1158  WBC 13.2*  NEUTROABS 11.5*  HGB 14.4  HCT 43.6  MCV 95.2  PLT 123XX123   Basic Metabolic Panel: Recent Labs  Lab 11/30/19 1158  NA 141  K 4.1  CL 104  CO2 25  GLUCOSE 124*  BUN 9  CREATININE 0.81  CALCIUM 9.1   GFR: Estimated Creatinine Clearance: 98.4 mL/min (by C-G formula based on SCr of 0.81 mg/dL).   Liver Function Tests: Recent Labs  Lab 11/30/19 1158  AST 16  ALT 19  ALKPHOS 38  BILITOT 1.2  PROT 6.7  ALBUMIN 3.7   Recent Labs  Lab 11/30/19 1158  LIPASE 18   CBG: Recent Labs  Lab 11/30/19 1844 12/01/19 0309 12/01/19 0633 12/01/19 1148 12/01/19 1606  GLUCAP 84 83 79 95 85   Urine  analysis:    Component Value Date/Time   COLORURINE YELLOW 11/30/2019 1319   APPEARANCEUR HAZY (A) 11/30/2019 1319   LABSPEC 1.020 11/30/2019 1319   PHURINE 5.0 11/30/2019 1319   GLUCOSEU NEGATIVE 11/30/2019 1319   HGBUR MODERATE (A) 11/30/2019 1319   BILIRUBINUR NEGATIVE 11/30/2019 1319   KETONESUR 80 (A) 11/30/2019 1319   PROTEINUR 30 (A) 11/30/2019 1319   NITRITE NEGATIVE 11/30/2019 1319   LEUKOCYTESUR NEGATIVE 11/30/2019 1319    Recent Results (from the past 240 hour(s))  Respiratory Panel by RT PCR (Flu A&B, Covid) - Nasopharyngeal Swab     Status: None   Collection Time: 11/30/19 12:13 PM   Specimen: Nasopharyngeal Swab  Result Value Ref Range Status   SARS Coronavirus 2 by RT PCR NEGATIVE NEGATIVE Final    Comment: (NOTE) SARS-CoV-2 target nucleic acids are NOT DETECTED. The SARS-CoV-2 RNA is generally detectable in upper respiratoy specimens during the acute phase of infection. The lowest concentration of SARS-CoV-2 viral copies this assay can detect is 131 copies/mL. A negative result does not preclude SARS-Cov-2 infection and should not be used as the sole basis for treatment or other patient management decisions. A negative result may occur with  improper specimen collection/handling, submission of specimen other than nasopharyngeal swab, presence of viral mutation(s) within the areas targeted by this assay,  and inadequate number of viral copies (<131 copies/mL). A negative result must be combined with clinical observations, patient history, and epidemiological information. The expected result is Negative. Fact Sheet for Patients:  PinkCheek.be Fact Sheet for Healthcare Providers:  GravelBags.it This test is not yet ap proved or cleared by the [place] FDA and  has been authorized for detection and/or diagnosis of SARS-CoV-2 by FDA under an Emergency Use Authorization (EUA). This EUA will remain  in  effect (meaning this test can be used) for the duration of the COVID-19 declaration under Section 564(b)(1) of the Act, 21 U.S.C. section 360bbb-3(b)(1), unless the authorization is terminated or revoked sooner.    Influenza A by PCR NEGATIVE NEGATIVE Final   Influenza B by PCR NEGATIVE NEGATIVE Final    Comment: (NOTE) The Xpert Xpress SARS-CoV-2/FLU/RSV assay is intended as an aid in  the diagnosis of influenza from Nasopharyngeal swab specimens and  should not be used as a sole basis for treatment. Nasal washings and  aspirates are unacceptable for Xpert Xpress SARS-CoV-2/FLU/RSV  testing. Fact Sheet for Patients: PinkCheek.be Fact Sheet for Healthcare Providers: GravelBags.it This test is not yet approved or cleared by the [place] FDA and  has been authorized for detection and/or diagnosis of SARS-CoV-2 by  FDA under an Emergency Use Authorization (EUA). This EUA will remain  in effect (meaning this test can be used) for the duration of the  Covid-19 declaration under Section 564(b)(1) of the Act, 21  U.S.C. section 360bbb-3(b)(1), unless the authorization is  terminated or revoked. Performed at [facility], [address], [place], [place] [address]      Radiology Studies: CT Chest W Contrast  Result Date: 11/30/2019 CLINICAL DATA:  2 month history of difficulty swallowing. Mid chest pain with swallowing. EXAM: CT CHEST WITH CONTRAST TECHNIQUE: Multidetector CT imaging of the chest was performed during intravenous contrast administration. CONTRAST:  32mL OMNIPAQUE IOHEXOL 300 MG/ML  SOLN COMPARISON:  None. FINDINGS: Cardiovascular: The heart size is normal. No substantial pericardial effusion. Coronary artery calcification is evident. Atherosclerotic calcification is noted in the wall of the thoracic aorta. Mediastinum/Nodes: 15 mm short axis AP window lymph node is associated with 18 mm short axis subcarinal node. 9  mm short axis retroesophageal node visible on image 95/2. Asymmetric relatively focal wall thickening is noted in the distal esophagus (image 114/series 2) with several small distal paraesophageal and gastrohepatic ligament lymph nodes evident. 12 mm short axis paraspinal node identified adjacent to the descending aorta on 01/30 9/2. Lungs/Pleura: Areas of clustered ill-defined tree-in-bud nodularity are seen in the upper and lower lobes bilaterally. These nodular components measure up to 10 mm in the right lower lobe (97/4) no pleural effusion. Upper Abdomen: 2.6 cm cyst identified upper pole left kidney. As noted above, upper normal lymph nodes are seen in the gastrohepatic ligament. Musculoskeletal: No worrisome lytic or sclerotic osseous abnormality. IMPRESSION: 1. Relatively focal, asymmetric wall thickening distal esophagus raises concern for mass lesion. There is some minimal fluid/debris in the esophageal lumen proximal to the distal wall thickening. 2. Ill-defined tree-in-bud nodularity in all lobes of both lungs some of the nodular components measuring up to 10 mm. Distribution favors atypical infection including MAI although aspiration could have this appearance. 3. Mild mediastinal lymphadenopathy with upper normal lymph nodes in the gastrohepatic ligament. If the distal esophageal wall thickening represents neoplasm, metastatic disease would be a concern. This could also be reactive to the bilateral lung disease. Electronically Signed   By: Randall Hiss  Tery Sanfilippo M.D.   On: 11/30/2019 13:59    Scheduled Meds: . enoxaparin (LOVENOX) injection  40 mg Subcutaneous Q24H   Continuous Infusions: . dextrose 5 % and 0.45% NaCl 75 mL/hr at 12/01/19 0723  . famotidine (PEPCID) IV Stopped (11/30/19 1932)     LOS: 0 days    Time spent: 30 minutes   Barton Dubois, MD [facility] Pager [phone]   12/01/2019, 4:31 PM

## 2019-12-01 NOTE — Anesthesia Procedure Notes (Signed)
Date/Time: 12/01/2019 1:24 PM Performed by: Vista Deck, CRNA Pre-anesthesia Checklist: Patient identified, Emergency Drugs available, Suction available, Timeout performed and Patient being monitored Patient Re-evaluated:Patient Re-evaluated prior to induction Oxygen Delivery Method: Nasal Cannula

## 2019-12-01 NOTE — Transfer of Care (Signed)
Immediate Anesthesia Transfer of Care Note  Patient: Timothy Oconnor  Procedure(s) Performed: ESOPHAGOGASTRODUODENOSCOPY (EGD) WITH PROPOFOL (N/A ) BIOPSY  Patient Location: PACU  Anesthesia Type:General  Level of Consciousness: awake, alert  and patient cooperative  Airway & Oxygen Therapy: Patient Spontanous Breathing  Post-op Assessment: Report given to RN and Post -op Vital signs reviewed and stable  Post vital signs: Reviewed and stable  Last Vitals:  Vitals Value Taken Time  BP    Temp    Pulse 51 12/01/19 1351  Resp 21 12/01/19 1351  SpO2 88 % 12/01/19 1351  Vitals shown include unvalidated device data.  Last Pain:  Vitals:   12/01/19 1217  TempSrc: Oral  PainSc: 0-No pain      Patients Stated Pain Goal: 8 ([id number])  Complications: No apparent anesthesia complications  SEE PACU FLOW SHEET FOR VITALS

## 2019-12-01 NOTE — Op Note (Signed)
[facility] Patient Name: Timothy Oconnor Procedure Date: 12/01/2019 1:17 PM MRN: [id number] Date of Birth: 08-13-1951 Attending MD: Hildred Laser , MD CSN: [id number] Age: 68 Admit Type: Outpatient Procedure:                Upper GI endoscopy Indications:              Esophageal dysphagia, Abnormal CT of the GI tract Providers:                Hildred Laser, MD, Otis Peak B. Sharon Seller, RN, Raphael Gibney, Technician Referring MD:             Barton Dubois, MD Medicines:                Propofol per Anesthesia Complications:            No immediate complications. Estimated Blood Loss:     Estimated blood loss was minimal. Procedure:                Pre-Anesthesia Assessment:                           - Prior to the procedure, a History and Physical                            was performed, and patient medications and                            allergies were reviewed. The patient's tolerance of                            previous anesthesia was also reviewed. The risks                            and benefits of the procedure and the sedation                            options and risks were discussed with the patient.                            All questions were answered, and informed consent                            was obtained. Prior Anticoagulants: The patient has                            taken no previous anticoagulant or antiplatelet                            agents except for aspirin. ASA Grade Assessment:                            III - A patient with severe systemic disease. After  reviewing the risks and benefits, the patient was                            deemed in satisfactory condition to undergo the                            procedure.                           After obtaining informed consent, the endoscope was                            passed under direct vision. Throughout the   procedure, the patient's blood pressure, pulse, and                            oxygen saturations were monitored continuously. The                            [id number]) scope was introduced through the                            mouth, and advanced to the second part of duodenum.                            The upper GI endoscopy was accomplished without                            difficulty. The patient tolerated the procedure                            well. Scope In: 1:31:16 PM Scope Out: 1:42:25 PM Total Procedure Duration: 0 hours 11 minutes 9 seconds  Findings:      The proximal esophagus and mid esophagus were normal.      Food was found in the mid esophagus. Removal of food was accomplished.      One malignant-appearing, intrinsic severe stenosis was found 35 to 40 cm       from the incisors. This stenosis measured 1 cm (in length). The stenosis       was traversed. This was biopsied with a cold forceps for histology.      The Z-line was regular and was found 40 cm from the incisors.      A 4 cm hiatal hernia was present.      The entire examined stomach was normal.      The duodenal bulb and second portion of the duodenum were normal. Impression:               - Normal proximal esophagus and mid esophagus.                           - Food in the mid esophagus proximal to malignant                            stricture. Removal was successful.                           -  Malignant-appearing esophageal stenosis. Biopsied.                           - Z-line regular, 40 cm from the incisors.                           - 4 cm hiatal hernia.                           - Normal stomach.                           - Normal duodenal bulb and second portion of the                            duodenum. Moderate Sedation:      Per Anesthesia Care Recommendation:           - Patient has a contact number available for                            emergencies. The signs and symptoms of  potential                            delayed complications were discussed with the                            patient. Return to normal activities tomorrow.                            Written discharge instructions were provided to the                            patient.                           - Return patient to ER for ongoing care.                           - Clear liquid diet today.                           - Continue present medications.                           - Await pathology results.                           - Consider PEG for feeding. Procedure Code(s):        --- Professional ---                           [id number], Esophagogastroduodenoscopy, flexible,                            transoral; with removal of foreign body(s)  [id number], Esophagogastroduodenoscopy, flexible,                            transoral; with biopsy, single or multiple Diagnosis Code(s):        --- Professional ---                           [id number], Food in esophagus causing other injury,                            initial encounter                           K22.2, Esophageal obstruction                           K44.9, Diaphragmatic hernia without obstruction or                            gangrene                           R13.14, Dysphagia, pharyngoesophageal phase                           R93.3, Abnormal findings on diagnostic imaging of                            other parts of digestive tract CPT copyright 2019 American Medical Association. All rights reserved. The codes documented in this report are preliminary and upon coder review may  be revised to meet current compliance requirements. Hildred Laser, MD Hildred Laser, MD 12/01/2019 1:56:51 PM This report has been signed electronically. Number of Addenda: 0

## 2019-12-01 NOTE — Anesthesia Preprocedure Evaluation (Signed)
Anesthesia Evaluation  Patient identified by MRN, date of birth, ID band Patient awake    Reviewed: Allergy & Precautions, NPO status , Patient's Chart, lab work & pertinent test results, reviewed documented beta blocker date and time   Airway Mallampati: II  TM Distance: >3 FB Neck ROM: Full    Dental  (+) Missing, Caps, Dental Advisory Given   Pulmonary former smoker,    Pulmonary exam normal breath sounds clear to auscultation       Cardiovascular Exercise Tolerance: Good hypertension, Pt. on medications and Pt. on home beta blockers + Past MI and + Cardiac Stents (6 or 7 stents, kast 2 stents in 2018)  (-) DVT  Rhythm:Regular Rate:Normal     Neuro/Psych negative neurological ROS  negative psych ROS   GI/Hepatic Neg liver ROS, GERD (Dysphagia)  Medicated,  Endo/Other  diabetes, Well Controlled, Type 2  Renal/GU negative Renal ROS   BPH    Musculoskeletal negative musculoskeletal ROS (+) Back pain   Abdominal   Peds negative pediatric ROS (+)  Hematology negative hematology ROS (+)   Anesthesia Other Findings Cardiology assessment : 05/2019 The patient has CAD and is s/p remote PCI DES of RCA and recent STEMI treated with PPCI DES of CX Oct 2018. There is borderline disease in the LAD. The patient is in CC-1 for angina. He has made good recovery from his MI. No clinical evidence of CHF or arrhythmia. In view of easy bruising it is enough time since his last PCI DES in 2018 that we can stop the Brilinta. Other than that, we should stay the course with the current medical regimen. No indication for an invasive cardiovascular strategy at this time.  PLAN: 1. Continue current medications: Current Outpatient Medications  Medication Sig Dispense Refill  . ascorbic acid, vitamin C, (VITAMIN C) 500 MG tablet Take 500 mg by mouth daily.  Marland Kitchen aspirin 81 MG EC tablet *ANTIPLATELET* Take 81 mg by mouth daily.  Marland Kitchen  atorvastatin (LIPITOR) 80 MG tablet Take 80 mg by mouth nightly.  . folic acid (FOLVITE) [id number] MCG tablet Take 800 mcg by mouth daily.  Marland Kitchen glucosam-chondroitin-diet cb25 116-100 mg Cap Take 1 capsule by mouth 2 times daily.  . isosorbide mononitrate ER (IMDUR ER) 30 MG 24 hr tablet Take 1 tablet (30 mg total) by mouth daily. 30 tablet 2  . losartan (COZAAR) 50 MG tablet Take 25 mg by mouth daily.  . metFORMIN ER (GLUCOPHAGE-XR) 500 MG extended release tablet Take 500 mg by mouth daily.  . metoPROLOL succinate (TOPROL-XL) 25 MG 24 hr tablet Take 25 mg by mouth daily.  . multivitamin (THERAGRAN) per tablet Take 1 tablet by mouth daily.  . nitroglycerin (NITROSTAT) 0.4 MG SL tablet Place 1 tablet (0.4 mg total) under the tongue every 5 (five) minutes as needed for Chest pain. Call doctor/911 if take 2 doses. Max 3/day. 25 tablet 5  . omega-3 fatty acids-vitamin E 1,000 mg Cap Take 1 capsule by mouth 2 times daily.  . saw palmetto fruit 450 mg Cap Take 3 capsules by mouth 2 times daily.  . tamsulosin (FLOMAX) 0.4 mg Cp24 Take 0.4 mg by mouth daily.  Marland Kitchen ZETIA 10 mg tablet Take 10 mg by mouth daily.   No current facility-administered medications for this visit.   2. STOP ticagrelor (Brilinta) 3. Stay on aspirin 81 mg a day without interruption.  4. Follow a low fat, low cholesterol diet.  5. Try to get regular exercise at least 30 minutes (  no more, no less) at least every other day.  6. Consistency of exercise is more important than duration or intensity.  7. Call 911 and go to the nearest emergency room for any episode of severe chest pain lasting longer than 15 minutes and/or unresponsive to 3 nitroglycerin tablets. 8. Give Dr. Clovia Cuff a phone call [phone]) if you develop a increased exertional chest pain or shortness of breath.  9. Regular follow-up visits with your primary care physician. 10. Cardiology follow-up visit in one year.     Reproductive/Obstetrics negative OB ROS                             Anesthesia Physical Anesthesia Plan  ASA: III  Anesthesia Plan: General   Post-op Pain Management:    Induction: Intravenous  PONV Risk Score and Plan: TIVA  Airway Management Planned: Natural Airway, Nasal Cannula and Simple Face Mask  Additional Equipment:   Intra-op Plan:   Post-operative Plan:   Informed Consent: I have reviewed the patients History and Physical, chart, labs and discussed the procedure including the risks, benefits and alternatives for the proposed anesthesia with the patient or authorized representative who has indicated his/her understanding and acceptance.     Dental advisory given  Plan Discussed with: CRNA  Anesthesia Plan Comments: (Possible GA with ETT was discussed )       Anesthesia Quick Evaluation

## 2019-12-01 NOTE — Progress Notes (Signed)
Brief EGD note.  Normal mucosa of the proximal esophagus. Foreign body at mid esophagus proximal to malignant stricture. Foreign body removed using a Roth net. Ulcerated mass with a stricture noted from 35 to 40 cm. Biopsy taken from proximal margin. 4 cm size sliding hiatal hernia. Normal mucosa of the stomach first and second part of the duodenum.

## 2019-12-02 DIAGNOSIS — C155 Malignant neoplasm of lower third of esophagus: Secondary | ICD-10-CM

## 2019-12-02 LAB — PROTIME-INR
INR: 1.1 (ref 0.8–1.2)
Prothrombin Time: 13.6 seconds (ref 11.4–15.2)

## 2019-12-02 LAB — GLUCOSE, CAPILLARY
Glucose-Capillary: 114 mg/dL — ABNORMAL HIGH (ref 70–99)
Glucose-Capillary: 106 mg/dL — ABNORMAL HIGH (ref 70–99)
Glucose-Capillary: 107 mg/dL — ABNORMAL HIGH (ref 70–99)
Glucose-Capillary: 120 mg/dL — ABNORMAL HIGH (ref 70–99)
Glucose-Capillary: 106 mg/dL — ABNORMAL HIGH (ref 70–99)

## 2019-12-02 MED ORDER — ENOXAPARIN SODIUM 40 MG/0.4ML ~~LOC~~ SOLN: 40.0000 mg | SUBCUTANEOUS | Status: DC

## 2019-12-02 MED ORDER — BOOST / RESOURCE BREEZE PO LIQD CUSTOM
1.0000 | Freq: Three times a day (TID) | ORAL | Status: DC
  Administered 2019-12-02: 1 via ORAL

## 2019-12-02 NOTE — Progress Notes (Addendum)
IR requested by Dr. Laural Golden for possible image-guided percutaneous gastrostomy tube placement.  Procedure has been reviewed and approved by Dr. Earleen Newport. Plan for image-guided percutaneous gastrostomy tube placement in IR tentatively for 12/03/2019 in [facility] IR. [place], RN to arrange [facility] transportation to and from [facility] (patient to arrive no later than 1100). Patient to be NPO at midnight. Hold Lovenox until Thursday. INR pending. COVID negative 11/30/2019. Will consent patient in [facility] IR. [place], RN aware of above.  Please call IR with questions/concerns.   Bea Graff Hazem Kenner, PA-C 12/02/2019, 4:49 PM

## 2019-12-02 NOTE — Progress Notes (Signed)
PROGRESS NOTE    Timothy Oconnor  [id number] DOB: 01-15-1951 DOA: 11/30/2019 PCP: [name], MD     Brief Narrative:  68 y.o. male with a past medical history significant for diabetes, heart disease/CAD, BPH and high cholesterol; who presented to the emergency department secondary to ongoing dysphagia.  Patient reports symptom has been present approximately for 62-month and progressing.  Was having difficulty with solid food but now having trouble with liquids and even his pills.  He reported having sensation of food or liquids getting stuck in the middle of his chest with subsequent development of nausea and vomiting.  For the last week prior to admission patient has not been able to eat anything and expressed an overall loss of 7 pounds.  Patient was seen by PCP with outpatient follow-up scheduled with GI for December 31, 2019; unfortunately given worsening symptoms patient presented to the ED for further evaluation and management.  Patient denies fever, chills, difficulty breathing, cough, contact with any known COVID-19 patient, hematuria, dysuria, melena, hematochezia, abdominal pain or any other complaints.  Work-up demonstrated mild elevation in his WBCs and otherwise stable results. CT scan of the chest demonstrated:  1-Relatively focal, asymmetric wall thickening of the distal esophagus raising concerns for mass lesion.  There is some minimal fluid/debris is in the esophageal lumen proximal to the distal wall thickening.   2-Ill-defined tree-in-bud nodularity in all lobes of both lungs some of the nodular components measuring up to 10 mm. Distribution favors atypical infection including MAI although aspiration could have this appearance.  3-Mild mediastinal lymphadenopathy with upper normal lymph nodes in the gastrohepatic ligament. If the distal esophageal wall thickening represents neoplasm, metastatic disease would be a concern. This could also be reactive to the bilateral lung  disease.    Assessment & Plan: 1-Dysphagia -with CT findings concerning for malignancy -continue NPO status -EGD has demonstrated distal esophageal malignancy with stricture -continue IV fluid support and PRN antiemetics -General surgery has been consulted to assist with PEG placement if patient no amenable candidate for resectability. 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 -Follow-up L ability to tolerate clear liquid diet; he will end requiring an alternative route for proper nutrition/hydration and to take his medications.  2-HTN (hypertension) -Overall stable and well-controlled -As needed labetalol has been ordered. -Holding the rest of his antihypertensive regimen currently.  3-Type 2 diabetes mellitus without complication (HCC) -Borderline normal CBGs in the setting of n.p.o. status and previous extended release Metformin usage prior to admission. -IV fluids has been changed to D5 half-normal saline; stable overall. -Continue to follow CBGs every 4 hours while NPO; per GI may be allow clear liquid diet later today.  4-HLD (hyperlipidemia) -Resume statins when able to tolerate p.o. or alternative route has been established.  5-BPH (benign prostatic hyperplasia) -Resume the use of Flomax once able to tolerate p.o. or alternative route has been established. -Patient denies urinary retention symptoms currently.  6-hx of CAD -will resume home meds when able to take PO's or alternative route established.   DVT prophylaxis: Lovenox Code Status: Full code Family Communication: no family at bedside; patient updated and all questions answered.  Disposition Plan: Patient remains in the hospital, continue fluid resuscitation, CBGs borderline and at this moment is still not able to tolerate p.o.'s.  Follow GI service, oncology service and general surgery recommendations.  Consultants:   Gastroenterology service  General  surgery  Oncology service  Procedures:   EGD 12/01/2019  Antimicrobials:  Anti-infectives (From admission, onward)   None       Subjective: No fever, no chest pain, no shortness of breath, no nausea, no vomiting.  In no acute distress.  Objective: Vitals:   12/01/19 1554 12/01/19 1951 12/01/19 2047 12/02/19 0520  BP: (!) 157/85  138/86 (!) 156/79  Pulse: 61  62 62  Resp: 16  20 18   Temp: 97.8 F (36.6 C)  98.7 F (37.1 C) 98.6 F (37 C)  TempSrc: Oral  Oral Oral  SpO2: 99% 99% 96% 96%  Weight: 95.6 kg     Height: 5\' 10"  (1.778 m)       Intake/Output Summary (Last 24 hours) at 12/02/2019 1518 Last data filed at 12/02/2019 1500 Gross per 24 hour  Intake 1202.23 ml  Output --  Net 1202.23 ml   Filed Weights   11/30/19 1119 12/01/19 1217 12/01/19 1554  Weight: 93.9 kg 93.9 kg 95.6 kg    Examination: General exam: Alert, awake, oriented x 3; reports no chest pain, no shortness of breath, no nausea or vomiting.  Patient is afebrile.  Speaking in full sentences. Respiratory system: Clear to auscultation. Respiratory effort normal. Cardiovascular system:RRR. No murmurs, rubs, gallops. Gastrointestinal system: Abdomen is nondistended, soft and nontender. No organomegaly or masses felt. Normal bowel sounds heard. Central nervous system: Alert and oriented. No focal neurological deficits. Extremities: No C/C/E, +pedal pulses Skin: No rashes, lesions or ulcers Psychiatry: Judgement and insight appear normal. Mood & affect appropriate.    Data Reviewed: I have personally reviewed following labs and imaging studies  CBC: Recent Labs  Lab 11/30/19 1158  WBC 13.2*  NEUTROABS 11.5*  HGB 14.4  HCT 43.6  MCV 95.2  PLT [id number]   Basic Metabolic Panel: Recent Labs  Lab 11/30/19 1158  NA 141  K 4.1  CL 104  CO2 25  GLUCOSE 124*  BUN 9  CREATININE 0.81  CALCIUM 9.1   GFR: Estimated Creatinine Clearance: 98.4 mL/min (by C-G formula based on SCr of 0.81 mg/dL).    Liver Function Tests: Recent Labs  Lab 11/30/19 1158  AST 16  ALT 19  ALKPHOS 38  BILITOT 1.2  PROT 6.7  ALBUMIN 3.7   Recent Labs  Lab 11/30/19 1158  LIPASE 18   CBG: Recent Labs  Lab 12/01/19 2356 12/02/19 0404 12/02/19 0628 12/02/19 0754 12/02/19 1147  GLUCAP 102* 114* 106* 107* 120*   Urine analysis:    Component Value Date/Time   COLORURINE YELLOW 11/30/2019 1319   APPEARANCEUR HAZY (A) 11/30/2019 1319   LABSPEC 1.020 11/30/2019 1319   PHURINE 5.0 11/30/2019 1319   GLUCOSEU NEGATIVE 11/30/2019 1319   HGBUR MODERATE (A) 11/30/2019 1319   BILIRUBINUR NEGATIVE 11/30/2019 1319   KETONESUR 80 (A) 11/30/2019 1319   PROTEINUR 30 (A) 11/30/2019 1319   NITRITE NEGATIVE 11/30/2019 1319   LEUKOCYTESUR NEGATIVE 11/30/2019 1319    Recent Results (from the past 240 hour(s))  Respiratory Panel by RT PCR (Flu A&B, Covid) - Nasopharyngeal Swab     Status: None   Collection Time: 11/30/19 12:13 PM   Specimen: Nasopharyngeal Swab  Result Value Ref Range Status   SARS Coronavirus 2 by RT PCR NEGATIVE NEGATIVE Final    Comment: (NOTE) SARS-CoV-2 target nucleic acids are NOT DETECTED. The SARS-CoV-2 RNA is generally detectable in upper respiratoy specimens during the acute phase of infection. The lowest concentration of SARS-CoV-2 viral copies this assay can detect is 131 copies/mL. A negative result does not preclude SARS-Cov-2 infection and should  not be used as the sole basis for treatment or other patient management decisions. A negative result may occur with  improper specimen collection/handling, submission of specimen other than nasopharyngeal swab, presence of viral mutation(s) within the areas targeted by this assay, and inadequate number of viral copies (<131 copies/mL). A negative result must be combined with clinical observations, patient history, and epidemiological information. The expected result is Negative. Fact Sheet for Patients:   PinkCheek.be Fact Sheet for Healthcare Providers:  GravelBags.it This test is not yet ap proved or cleared by the [place] FDA and  has been authorized for detection and/or diagnosis of SARS-CoV-2 by FDA under an Emergency Use Authorization (EUA). This EUA will remain  in effect (meaning this test can be used) for the duration of the COVID-19 declaration under Section 564(b)(1) of the Act, 21 U.S.C. section 360bbb-3(b)(1), unless the authorization is terminated or revoked sooner.    Influenza A by PCR NEGATIVE NEGATIVE Final   Influenza B by PCR NEGATIVE NEGATIVE Final    Comment: (NOTE) The Xpert Xpress SARS-CoV-2/FLU/RSV assay is intended as an aid in  the diagnosis of influenza from Nasopharyngeal swab specimens and  should not be used as a sole basis for treatment. Nasal washings and  aspirates are unacceptable for Xpert Xpress SARS-CoV-2/FLU/RSV  testing. Fact Sheet for Patients: PinkCheek.be Fact Sheet for Healthcare Providers: GravelBags.it This test is not yet approved or cleared by the [place] FDA and  has been authorized for detection and/or diagnosis of SARS-CoV-2 by  FDA under an Emergency Use Authorization (EUA). This EUA will remain  in effect (meaning this test can be used) for the duration of the  Covid-19 declaration under Section 564(b)(1) of the Act, 21  U.S.C. section 360bbb-3(b)(1), unless the authorization is  terminated or revoked. Performed at [facility], [address], [place], [place] [address]      Radiology Studies: No results found.  Scheduled Meds: . enoxaparin (LOVENOX) injection  40 mg Subcutaneous Q24H   Continuous Infusions: . dextrose 5 % and 0.45% NaCl 75 mL/hr at 12/02/19 0630  . famotidine (PEPCID) IV 20 mg (12/02/19 1458)     LOS: 1 day    Time spent: 30 minutes   Barton Dubois, MD [facility] Pager [phone]   12/02/2019, 3:18 PM

## 2019-12-02 NOTE — Consult Note (Signed)
Aware of consultation.  Awaiting further workup and input from Oncology as to further treatment plans prior to potential PEG placement.   Discussed with Drs. Rehman and I[place]o.

## 2019-12-02 NOTE — Progress Notes (Signed)
Initial Nutrition Assessment  DOCUMENTATION CODES:   Obesity unspecified  INTERVENTION:  -Boost Breeze po TID, each supplement provides 250 kcal and 9 grams of protein  -Monitor for PEG placement, recommend initiating Osmolite 1.5 @ 20 ml/hr, advance 10 ml every 6 hrs to goal rate 60 ml/hr (1440 ml/day) -Prostat 30 ml via tube daily  Provides 2260 kcal, 105 grams protein, 1094 ml free H20 from formula   -Upon initiation of tube feeding, monitor magnesium, potassium, and phosphorus daily for at least 3 days, MD to replete as needed, as pt is at risk for refeeding syndrome given poor po intake secondary to dysphagia.  NUTRITION DIAGNOSIS:   Inadequate oral intake related to altered GI function(adenocarcinoma of distal esophagus) as evidenced by other (comment)(CL diet order insufficient to meet needs; IR PEG placement pending).    GOAL:   Patient will meet greater than or equal to 90% of their needs   MONITOR:   Supplement acceptance, PO intake, Labs, Weight trends, I & O's  REASON FOR ASSESSMENT:   Malnutrition Screening Tool    ASSESSMENT:  RD working remotely.   68 year old male with past medical history of T2DM, CAD, BPH, and HLD who presented to ED with complaints of ongoing dysphagia for the past 2 months. Patient reports having difficultly with solid foods that has progressed and now having trouble with liquids and medications. He reports unable to eat anything in the past week and endorses wt loss of 7 lbs. 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 GI noted concern for mediastinal adenopathy, unknown at this time if resectable; oncology consult pending.  Per notes, general surgery  consulted for PEG placement. Due to concern about seeding the PEG site, pt will have PEG placed in IR.   Patient documented with 0% intake x 1 recorded meal on clear liquid diet.   Will continue to monitor for PEG placement, recommendations for tube feedings provided to initiate when appropriate. RD will provide Boost Breeze at this time to aid with calorie/protein needs.  Current wt 95.6 kg (210.32 lbs) No recent wt history available, last weight prior to admission 100.7 kg recorded on 12/07/16  Medications reviewed Labs CBGs 102-120 x 24 hrs  NUTRITION - FOCUSED PHYSICAL EXAM: Unable to complete at this time, RD working remotely.  Diet Order:   Diet Order            Diet clear liquid Room service appropriate? Yes; Fluid consistency: Thin  Diet effective now              EDUCATION NEEDS:   No education needs have been identified at this time  Skin:  Skin Assessment: Reviewed RN Assessment  Last BM:  12/28 bowel prep for procedure  Height:   Ht Readings from Last 1 Encounters:  12/01/19 5\' 10"  (1.778 m)    Weight:   Wt Readings from Last 1 Encounters:  12/01/19 95.6 kg    Ideal Body Weight:  75.5 kg  BMI:  Body mass index is 30.24 kg/m.  Estimated Nutritional Needs:   Kcal:  2075-2250  Protein:  104-113  Fluid:  > 2 L/day   Lajuan Lines, RD, LDN Clinical Nutrition [facility] Telephone [phone] After Hours/Weekend Pager: [phone]

## 2019-12-02 NOTE — Progress Notes (Signed)
  Subjective:  Patient has no complaints.  He has been n.p.o. for PEG placement.  Objective: Blood pressure (!) 156/79, pulse 62, temperature 98.6 F (37 C), temperature source Oral, resp. rate 18, height '5\' 10"'$  (1.778 m), weight 95.6 kg, SpO2 96 %. Patient is alert and in no acute distress.  Labs/studies Results:  CBC Latest Ref Rng & Units 11/30/2019  WBC 4.0 - 10.5 K/uL 13.2(H)  Hemoglobin 13.0 - 17.0 g/dL 14.4  Hematocrit 39.0 - 52.0 % 43.6  Platelets 150 - 400 K/uL 275    CMP Latest Ref Rng & Units 11/30/2019  Glucose 70 - 99 mg/dL 124(H)  BUN 8 - 23 mg/dL 9  Creatinine 0.61 - 1.24 mg/dL 0.81  Sodium 135 - 145 mmol/L 141  Potassium 3.5 - 5.1 mmol/L 4.1  Chloride 98 - 111 mmol/L 104  CO2 22 - 32 mmol/L 25  Calcium 8.9 - 10.3 mg/dL 9.1  Total Protein 6.5 - 8.1 g/dL 6.7  Total Bilirubin 0.3 - 1.2 mg/dL 1.2  Alkaline Phos 38 - 126 U/L 38  AST 15 - 41 U/L 16  ALT 0 - 44 U/L 19    Hepatic Function Latest Ref Rng & Units 11/30/2019  Total Protein 6.5 - 8.1 g/dL 6.7  Albumin 3.5 - 5.0 g/dL 3.7  AST 15 - 41 U/L 16  ALT 0 - 44 U/L 19  Alk Phosphatase 38 - 126 U/L 38  Total Bilirubin 0.3 - 1.2 mg/dL 1.2    Esophageal biopsy reveals adenocarcinoma arising out of Barrett's mucosa   Assessment:  #1.  Adenocarcinoma of distal esophagus arising from Barrett's mucosa.  Biopsy results reviewed with patient and his wife Marquett Bertoli who is at bedside.  It remains to be seen whether or not he has resectable disease.  He will need further work-up.  Mediastinal adenopathy is a concern. Discussed with Dr. Bridgett Larsson.  Dr. Arnoldo Morale is concerned about seeding the PEG site.  Therefore will ask IR for PEG placement.  #2.  Pulmonary infiltrates of unknown significance.  Patient has no respiratory symptoms   Recommendations  Begin clear liquids. Oncology consultation. PEG placement by IR.

## 2019-12-03 ENCOUNTER — Encounter (HOSPITAL_COMMUNITY): Payer: Self-pay | Admitting: *Deleted

## 2019-12-03 ENCOUNTER — Other Ambulatory Visit (HOSPITAL_COMMUNITY): Payer: Self-pay | Admitting: *Deleted

## 2019-12-03 DIAGNOSIS — C160 Malignant neoplasm of cardia: Secondary | ICD-10-CM

## 2019-12-03 DIAGNOSIS — C159 Malignant neoplasm of esophagus, unspecified: Secondary | ICD-10-CM

## 2019-12-03 DIAGNOSIS — C155 Malignant neoplasm of lower third of esophagus: Principal | ICD-10-CM

## 2019-12-03 LAB — GLUCOSE, CAPILLARY
Glucose-Capillary: 105 mg/dL — ABNORMAL HIGH (ref 70–99)
Glucose-Capillary: 105 mg/dL — ABNORMAL HIGH (ref 70–99)
Glucose-Capillary: 127 mg/dL — ABNORMAL HIGH (ref 70–99)
Glucose-Capillary: 134 mg/dL — ABNORMAL HIGH (ref 70–99)

## 2019-12-03 LAB — CBC WITH DIFFERENTIAL/PLATELET
Abs Immature Granulocytes: 0.01 10*3/uL (ref 0.00–0.07)
Basophils Absolute: 0 10*3/uL (ref 0.0–0.1)
Basophils Relative: 1 %
Eosinophils Absolute: 0.1 10*3/uL (ref 0.0–0.5)
Eosinophils Relative: 2 %
HCT: 41 % (ref 39.0–52.0)
Hemoglobin: 13.8 g/dL (ref 13.0–17.0)
Immature Granulocytes: 0 %
Lymphocytes Relative: 16 %
Lymphs Abs: 0.9 10*3/uL (ref 0.7–4.0)
MCH: 31.3 pg (ref 26.0–34.0)
MCHC: 33.7 g/dL (ref 30.0–36.0)
MCV: 93 fL (ref 80.0–100.0)
Monocytes Absolute: 0.7 10*3/uL (ref 0.1–1.0)
Monocytes Relative: 11 %
Neutro Abs: 3.9 10*3/uL (ref 1.7–7.7)
Neutrophils Relative %: 70 %
Platelets: 282 10*3/uL (ref 150–400)
RBC: 4.41 MIL/uL (ref 4.22–5.81)
RDW: 13 % (ref 11.5–15.5)
WBC: 5.7 10*3/uL (ref 4.0–10.5)
nRBC: 0 % (ref 0.0–0.2)

## 2019-12-03 MED ORDER — METOPROLOL TARTRATE 25 MG PO TABS: 12.5000 mg | ORAL_TABLET | Freq: Two times a day (BID) | ORAL | 1 refills | Status: AC

## 2019-12-03 MED ORDER — METOPROLOL TARTRATE 25 MG PO TABS: 12.5000 mg | ORAL_TABLET | Freq: Two times a day (BID) | ORAL | Status: DC

## 2019-12-03 NOTE — Discharge Summary (Addendum)
Physician Discharge Summary  Timothy Oconnor [id number] DOB: 10-Nov-1951 DOA: 11/30/2019  PCP: [name], MD  Admit date: 11/30/2019 Discharge date: 12/03/2019  Admitted From: Home Disposition:  Home   Recommendations for Outpatient Follow-up:  1. Follow up with PCP in 1-2 weeks 2. Please obtain BMP/CBC in one week     Discharge Condition: Stable CODE STATUS: FULL Diet recommendation: Heart Healthy / Carb Modified-->Ensures or Boost Breeze TID   Brief/Interim Summary: 68 y.o.malewith a past medical history significant for diabetes, heart disease/CAD, BPH and high cholesterol; who presented to the emergency department secondary to ongoing dysphagia. Patient reports symptom has been present approximately for 28-month and progressing. Was having difficulty with solid food but now having trouble with liquids and even his pills. He reported having sensation of food or liquids getting stuck in the middle of his chest with subsequent development of nausea and vomiting. For the last week prior to admission patient has not been able to eat anything and expressed an overall loss of 7 pounds. Patient was seen by PCP with outpatient follow-up scheduled with GI for December 31, 2019; unfortunately given worsening symptoms patient presented to the ED for further evaluation and management.  Patient denies fever, chills, difficulty breathing, cough, contact with any known COVID-19 patient, hematuria, dysuria, melena, hematochezia, abdominal pain or any other complaints.  Work-up demonstrated mild elevation in his WBCs and otherwise stable results. CT scan of the chest demonstrated:  1-Relatively focal, asymmetric wall thickening of the distal esophagus raising concerns for mass lesion. There is some minimal fluid/debris is in the esophageal lumen proximal to the distal wall thickening.  2-Ill-defined tree-in-bud nodularity in all lobes of both lungs some of the nodular components  measuring up to 10 mm. Distribution favors atypical infection including MAI although aspiration could have this appearance.  3-Mild mediastinal lymphadenopathy with upper normal lymph nodes in the gastrohepatic ligament. If the distal esophageal wall thickening represents neoplasm, metastatic disease would be a concern. This could also be reactive to the bilateral lung disease.   Discharge Diagnoses:   Dysphagia -due to esophageal adenocarcinoma -tolerating Ensure and Boost Breeze -EGD has demonstrated distal esophageal malignancy with stricture -continued IV fluid support and PRN antiemetics -General surgery has been consulted to assist with PEG placement if patient no amenable candidate for resectability--felt pt needed medonc eval work up first -Oncology service has been consulted; biopsy results demonstrating adenocarcinoma of the esophagus. -pt will be able to crush his meds  -12/03/19--case discussed with Dr. Ples Specter is ok with pt going home--pt already has med onc follow up and PET scan arranged for next week  2-HTN (hypertension) -Overall stable and well-controlled -change metoprolol succinate to tartrate so he can crush  3-Type 2 diabetes mellitus without complication ([facility]) -CBGs well controlled -he can crush metformin when he goes home -allow liberal glycemic control at this point  4-HLD (hyperlipidemia) -He may crush his statin  5-BPH (benign prostatic hyperplasia) -Flomax is a capsule--he can just open the capsule and take its contents  6-hx of CAD -he can crush his ASA  Discharge Instructions   Allergies as of 12/03/2019   No Known Allergies     Medication List    STOP taking these medications   metoprolol succinate 25 MG 24 hr tablet Commonly known as: TOPROL-XL     TAKE these medications   ascorbic acid 500 MG tablet Commonly known as: VITAMIN C Take 500 mg by mouth daily.   aspirin EC 81 MG tablet Take 81 mg by  mouth daily.     atorvastatin 80 MG tablet Commonly known as: LIPITOR Take 80 mg by mouth every evening.   Fish Oil 1000 MG Caps Take 1 capsule by mouth 2 (two) times daily.   folic acid [id number] MCG tablet Commonly known as: FOLVITE Take 800 mcg by mouth daily.   GLUCOSAMINE PO Take 120 mg by mouth 2 (two) times daily.   isosorbide mononitrate 30 MG 24 hr tablet Commonly known as: IMDUR Take 30 mg by mouth daily.   losartan 50 MG tablet Commonly known as: COZAAR Take 25 mg by mouth daily.   metFORMIN 500 MG 24 hr tablet Commonly known as: GLUCOPHAGE-XR Take 500 mg by mouth daily with breakfast.   metoprolol tartrate 25 MG tablet Commonly known as: LOPRESSOR Take 0.5 tablets (12.5 mg total) by mouth 2 (two) times daily.   Multi-Vitamins Tabs Take 1 tablet by mouth daily.   Saw Palmetto 450 MG Caps Take 1 capsule by mouth daily.   tamsulosin 0.4 MG Caps capsule Commonly known as: FLOMAX Take 0.4 mg by mouth daily.   Zetia 10 MG tablet Generic drug: ezetimibe Take 10 mg by mouth daily.       No Known Allergies  Consultations:  GI--Rehman   Procedures/Studies: CT Chest W Contrast  Result Date: 11/30/2019 CLINICAL DATA:  2 month history of difficulty swallowing. Mid chest pain with swallowing. EXAM: CT CHEST WITH CONTRAST TECHNIQUE: Multidetector CT imaging of the chest was performed during intravenous contrast administration. CONTRAST:  39mL OMNIPAQUE IOHEXOL 300 MG/ML  SOLN COMPARISON:  None. FINDINGS: Cardiovascular: The heart size is normal. No substantial pericardial effusion. Coronary artery calcification is evident. Atherosclerotic calcification is noted in the wall of the thoracic aorta. Mediastinum/Nodes: 15 mm short axis AP window lymph node is associated with 18 mm short axis subcarinal node. 9 mm short axis retroesophageal node visible on image 95/2. Asymmetric relatively focal wall thickening is noted in the distal esophagus (image 114/series 2) with several small  distal paraesophageal and gastrohepatic ligament lymph nodes evident. 12 mm short axis paraspinal node identified adjacent to the descending aorta on 01/30 9/2. Lungs/Pleura: Areas of clustered ill-defined tree-in-bud nodularity are seen in the upper and lower lobes bilaterally. These nodular components measure up to 10 mm in the right lower lobe (97/4) no pleural effusion. Upper Abdomen: 2.6 cm cyst identified upper pole left kidney. As noted above, upper normal lymph nodes are seen in the gastrohepatic ligament. Musculoskeletal: No worrisome lytic or sclerotic osseous abnormality. IMPRESSION: 1. Relatively focal, asymmetric wall thickening distal esophagus raises concern for mass lesion. There is some minimal fluid/debris in the esophageal lumen proximal to the distal wall thickening. 2. Ill-defined tree-in-bud nodularity in all lobes of both lungs some of the nodular components measuring up to 10 mm. Distribution favors atypical infection including MAI although aspiration could have this appearance. 3. Mild mediastinal lymphadenopathy with upper normal lymph nodes in the gastrohepatic ligament. If the distal esophageal wall thickening represents neoplasm, metastatic disease would be a concern. This could also be reactive to the bilateral lung disease. Electronically Signed   By: Misty Stanley M.D.   On: 11/30/2019 13:59        Discharge Exam: Vitals:   12/02/19 2053 12/03/19 0349  BP:  (!) 147/83  Pulse:  (!) 53  Resp:  18  Temp:  98 F (36.7 C)  SpO2: 96% 97%   Vitals:   12/02/19 0520 12/02/19 2052 12/02/19 2053 12/03/19 0349  BP: (!) 156/79 (!) 142/86  Marland Kitchen)  147/83  Pulse: 62 60  (!) 53  Resp: 18 20  18   Temp: 98.6 F (37 C) 98.3 F (36.8 C)  98 F (36.7 C)  TempSrc: Oral Oral  Oral  SpO2: 96% 96% 96% 97%  Weight:      Height:        General: Pt is alert, awake, not in acute distress Cardiovascular: RRR, S1/S2 +, no rubs, no gallops Respiratory: CTA bilaterally, no wheezing, no  rhonchi Abdominal: Soft, NT, ND, bowel sounds + Extremities: no edema, no cyanosis   The results of significant diagnostics from this hospitalization (including imaging, microbiology, ancillary and laboratory) are listed below for reference.    Significant Diagnostic Studies: CT Chest W Contrast  Result Date: 11/30/2019 CLINICAL DATA:  2 month history of difficulty swallowing. Mid chest pain with swallowing. EXAM: CT CHEST WITH CONTRAST TECHNIQUE: Multidetector CT imaging of the chest was performed during intravenous contrast administration. CONTRAST:  46mL OMNIPAQUE IOHEXOL 300 MG/ML  SOLN COMPARISON:  None. FINDINGS: Cardiovascular: The heart size is normal. No substantial pericardial effusion. Coronary artery calcification is evident. Atherosclerotic calcification is noted in the wall of the thoracic aorta. Mediastinum/Nodes: 15 mm short axis AP window lymph node is associated with 18 mm short axis subcarinal node. 9 mm short axis retroesophageal node visible on image 95/2. Asymmetric relatively focal wall thickening is noted in the distal esophagus (image 114/series 2) with several small distal paraesophageal and gastrohepatic ligament lymph nodes evident. 12 mm short axis paraspinal node identified adjacent to the descending aorta on 01/30 9/2. Lungs/Pleura: Areas of clustered ill-defined tree-in-bud nodularity are seen in the upper and lower lobes bilaterally. These nodular components measure up to 10 mm in the right lower lobe (97/4) no pleural effusion. Upper Abdomen: 2.6 cm cyst identified upper pole left kidney. As noted above, upper normal lymph nodes are seen in the gastrohepatic ligament. Musculoskeletal: No worrisome lytic or sclerotic osseous abnormality. IMPRESSION: 1. Relatively focal, asymmetric wall thickening distal esophagus raises concern for mass lesion. There is some minimal fluid/debris in the esophageal lumen proximal to the distal wall thickening. 2. Ill-defined tree-in-bud  nodularity in all lobes of both lungs some of the nodular components measuring up to 10 mm. Distribution favors atypical infection including MAI although aspiration could have this appearance. 3. Mild mediastinal lymphadenopathy with upper normal lymph nodes in the gastrohepatic ligament. If the distal esophageal wall thickening represents neoplasm, metastatic disease would be a concern. This could also be reactive to the bilateral lung disease. Electronically Signed   By: Misty Stanley M.D.   On: 11/30/2019 13:59     Microbiology: Recent Results (from the past 240 hour(s))  Respiratory Panel by RT PCR (Flu A&B, Covid) - Nasopharyngeal Swab     Status: None   Collection Time: 11/30/19 12:13 PM   Specimen: Nasopharyngeal Swab  Result Value Ref Range Status   SARS Coronavirus 2 by RT PCR NEGATIVE NEGATIVE Final    Comment: (NOTE) SARS-CoV-2 target nucleic acids are NOT DETECTED. The SARS-CoV-2 RNA is generally detectable in upper respiratoy specimens during the acute phase of infection. The lowest concentration of SARS-CoV-2 viral copies this assay can detect is 131 copies/mL. A negative result does not preclude SARS-Cov-2 infection and should not be used as the sole basis for treatment or other patient management decisions. A negative result may occur with  improper specimen collection/handling, submission of specimen other than nasopharyngeal swab, presence of viral mutation(s) within the areas targeted by this assay, and inadequate number  of viral copies (<131 copies/mL). A negative result must be combined with clinical observations, patient history, and epidemiological information. The expected result is Negative. Fact Sheet for Patients:  PinkCheek.be Fact Sheet for Healthcare Providers:  GravelBags.it This test is not yet ap proved or cleared by the [place] FDA and  has been authorized for detection and/or diagnosis of  SARS-CoV-2 by FDA under an Emergency Use Authorization (EUA). This EUA will remain  in effect (meaning this test can be used) for the duration of the COVID-19 declaration under Section 564(b)(1) of the Act, 21 U.S.C. section 360bbb-3(b)(1), unless the authorization is terminated or revoked sooner.    Influenza A by PCR NEGATIVE NEGATIVE Final   Influenza B by PCR NEGATIVE NEGATIVE Final    Comment: (NOTE) The Xpert Xpress SARS-CoV-2/FLU/RSV assay is intended as an aid in  the diagnosis of influenza from Nasopharyngeal swab specimens and  should not be used as a sole basis for treatment. Nasal washings and  aspirates are unacceptable for Xpert Xpress SARS-CoV-2/FLU/RSV  testing. Fact Sheet for Patients: PinkCheek.be Fact Sheet for Healthcare Providers: GravelBags.it This test is not yet approved or cleared by the [place] FDA and  has been authorized for detection and/or diagnosis of SARS-CoV-2 by  FDA under an Emergency Use Authorization (EUA). This EUA will remain  in effect (meaning this test can be used) for the duration of the  Covid-19 declaration under Section 564(b)(1) of the Act, 21  U.S.C. section 360bbb-3(b)(1), unless the authorization is  terminated or revoked. Performed at [facility], [address], [place], [place] [address]      Labs: Basic Metabolic Panel: Recent Labs  Lab 11/30/19 1158  NA 141  K 4.1  CL 104  CO2 25  GLUCOSE 124*  BUN 9  CREATININE 0.81  CALCIUM 9.1   Liver Function Tests: Recent Labs  Lab 11/30/19 1158  AST 16  ALT 19  ALKPHOS 38  BILITOT 1.2  PROT 6.7  ALBUMIN 3.7   Recent Labs  Lab 11/30/19 1158  LIPASE 18   No results for input(s): AMMONIA in the last 168 hours. CBC: Recent Labs  Lab 11/30/19 1158 12/03/19 1017  WBC 13.2* 5.7  NEUTROABS 11.5* 3.9  HGB 14.4 13.8  HCT 43.6 41.0  MCV 95.2 93.0  PLT 275 282   Cardiac Enzymes: No results for  input(s): CKTOTAL, CKMB, CKMBINDEX, TROPONINI in the last 168 hours. BNP: Invalid input(s): POCBNP CBG: Recent Labs  Lab 12/02/19 1701 12/02/19 2029 12/03/19 0002 12/03/19 0346 12/03/19 0754  GLUCAP 106* 127* 105* 105* 134*    Time coordinating discharge:  36 minutes  Signed:  Orson Eva, DO [facility] Pager: [phone] 12/03/2019, 12:31 PM

## 2019-12-03 NOTE — Progress Notes (Signed)
Orders for IR gastrostomy placement/NPO/[facility] transferred cancelled last night by [place], RN - spoke with patient's RN this morning who states she was told in report that oncology requested to complete their workup prior to gastrostomy placement.  Patient is approved for g-tube placement in IR when primary team feels this is appropriate, he will need to transferred to [facility] for the procedure which will need to be arranged prior. This could also be scheduled to be placed as an outpatient if patient/team prefers.  Please replace order for IR gastrostomy when team is ready to proceed.  Candiss Norse, PA-C Pager# [phone]

## 2019-12-03 NOTE — Care Management Important Message (Signed)
Important Message  Patient Details  Name: Timothy Oconnor MRN: [id number] Date of Birth: 08/14/1951   Medicare Important Message Given:  Yes([place], RN will deliver to patient)     [name] 12/03/2019, 1:59 PM

## 2019-12-03 NOTE — Progress Notes (Signed)
     CC:  Dysphagia, esophageal adenocarcinoma   Subjective:  -tolerating liquids (boost, juices) well without pain as long as he drinks slowly with small sips -no nausea/vomiting/abd pain, GERD -moving stools regularly -issues w/increased urinary frequency since Flomax being held while in the hospital   Objective:   S/P EGD 12/01/2019: - Normal proximal esophagus and mid esophagus. - Food in the mid esophagus proximal to malignant stricture. Removal was successful. - Malignant-appearing esophageal stenosis. Biopsied. - Z-line regular, 40 cm from the incisors. - 4 cm hiatal hernia. - Normal stomach. - Normal duodenal bulb and second portion of the duodenum.  Vital signs in last 24 hours: Temp:  [98 F (36.7 C)-98.3 F (36.8 C)] 98 F (36.7 C) (12/30 0349) Pulse Rate:  [53-60] 53 (12/30 0349) Resp:  [18-20] 18 (12/30 0349) BP: (142-147)/(83-86) 147/83 (12/30 0349) SpO2:  [96 %-97 %] 97 % (12/30 0349)   General:   Alert 68 year old male well developed in NAD Heart: RRR, no m/r/g Pulm: lungs clear to auscultation bilaterally Abdomen: soft, nontender, BS active x 4 quadrants Extremities:  Without edema. Neurologic:  Alert and  oriented x4;  grossly normal neurologically. Psych:  Alert and cooperative. Normal mood and affect.  Intake/Output from previous day: 12/29 0701 - 12/30 0700 In: 720 [P.O.:720] Out: -  Intake/Output this shift: No intake/output data recorded.  Lab Results: Recent Labs    11/30/19 1158  WBC 13.2*  HGB 14.4  HCT 43.6  PLT 275   BMET Recent Labs    11/30/19 1158  NA 141  K 4.1  CL 104  CO2 25  GLUCOSE 124*  BUN 9  CREATININE 0.81  CALCIUM 9.1   LFT Recent Labs    11/30/19 1158  PROT 6.7  ALBUMIN 3.7  AST 16  ALT 19  ALKPHOS 38  BILITOT 1.2   PT/INR Recent Labs    12/02/19 1715  LABPROT 13.6  INR 1.1   Hepatitis Panel No results for input(s): HEPBSAG, HCVAB, HEPAIGM, HEPBIGM in the last 72 hours.  No results  found.  Assessment / Plan:  79. 68 year old male with adenocarcinoma of the distal esophagus confirmed by EGD 12/28. -Boost Breeze po tid as recommended by RD, tube feedings to be initiated once PEG tube placed -Patient has follow-up scheduled with oncology & PET scan for Monday 12/07/18 -Would recommend transitioning his outpatient medications to liquid or crushable at d/c when able. -Ok to discharge home today  -Follow up in office with Dr. Laural Golden or Laurine Blazer PA-C in 2 weeks   2. GERD -Add Prevacid Solutab 30mg  BID at discharge   4. Mild Leukocytosis. WBC 13.2 on 12/27. He remains afebrile. -Repeat CBC ordered  5. Pulmonary nodularity right and left lobes with mediastinal adenopathy, discussed these findings with patient, to further discuss pulmonary findings with oncology and PCP  6. Family history of colon cancer. Last colonoscopy ? 2017 by GI in [place] -follow up in office to verify next colonoscopy due date   7. History of CAD, MI    Principal Problem:   Dysphagia Active Problems:   HTN (hypertension)   Type 2 diabetes mellitus without complication ([facility])   HLD (hyperlipidemia)   BPH (benign prostatic hyperplasia)     LOS: 2 days   Noralyn Pick  12/03/2019, 9:57 AM

## 2019-12-03 NOTE — Progress Notes (Signed)
Per Dr. Delton Coombes, orders placed for PET scan and consult for nutrition.

## 2019-12-03 NOTE — Progress Notes (Signed)
Oncology Navigator Note: I met with patient on the unit.  He is by himself.  I introduced myself and explained how I am involved in his care. I explained his upcoming appointments for scans and his initial visit with [place] at the clinic.  I gave him the instructions for PET scan. He is unable to eat anything solid so he is going to be sure not to drink ensures or anything other than water 4 hours prior to PET.  He verbalizes understanding.  I provided my contact information and the number to the clinic.

## 2019-12-03 NOTE — Progress Notes (Signed)
Discharge instructions given to patient and wife.  Both verbalized understanding of instructions.  Patient discharged home with wife in stable condition.

## 2019-12-03 NOTE — Consult Note (Signed)
Inpatient Consult  [address], [place] [address]   Service: Medical Oncology/Hematology    HPI:  Timothy Oconnor is a 68 year old Caucasian male who was seen in consult today for newly diagnosed head and neck cancer.  He has a past medical history significant for diabetes, coronary artery disease status post stent placement, hypertension, BPH, and hyperlipidemia. He presented to emergency room on 11/30/2019 with reports of ongoing dysphagia.  He reports the symptoms began approximately 2 months ago and have progressed.  Reports difficulty with solids foods and taking oral medications.  He then began to develop difficulty with swallowing liquids.  He has lost approximately 20 pounds over the past 2 months.  He does report nausea and vomiting for the last week.  He denies any change in bowel habits.   CT of chest was performed and demonstrated asymmetric wall thickening of the distal esophagus concerning for mass.  Also, mediastinal lymphadenopathy noted.  EGD was performed with biopsy.  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   Patient reports he was a smoker for 54 years.  Smoking 1 pack of cigarettes per day.  He quit approximately 2 years ago.  Patient does admit to daily alcohol use, approximately 1 drink per day.  He has a family history significant for colon cancer in his brother as well as lung cancer.      REVIEW OF SYSTEMS:  Review of Systems  Constitutional: Positive for appetite change.  HENT:  Negative.   Eyes: Negative.   Respiratory: Negative.   Cardiovascular: Negative.   Gastrointestinal: Positive for vomiting.  Endocrine: Negative.   Genitourinary: Negative.    Musculoskeletal: Negative.   Skin: Negative.   Neurological: Negative.   Hematological: Negative.   Psychiatric/Behavioral: Negative.      PAST MEDICAL/SURGICAL HISTORY:  Past Medical History:  Diagnosis Date  . Diabetes ([place])   .  Heart attack ([place])   . Heart disease   . High cholesterol   . Hyperplasia of prostate   . Hypertension   . Low back pain   . Tremor    Past Surgical History:  Procedure Laterality Date  . arterectomy     1993  . BIOPSY  12/01/2019   Procedure: BIOPSY;  Surgeon: Rogene Houston, MD;  Location: [facility];  Service: Endoscopy;;  esophagus  . ESOPHAGOGASTRODUODENOSCOPY (EGD) WITH PROPOFOL N/A 12/01/2019   Procedure: ESOPHAGOGASTRODUODENOSCOPY (EGD) WITH PROPOFOL;  Surgeon: Rogene Houston, MD;  Location: [facility];  Service: Endoscopy;  Laterality: N/A;  . Heart stents     2002, 2004, 2006     SOCIAL HISTORY:  Social History   Socioeconomic History  . Marital status: Married    Spouse name: Not on file  . Number of children: 2  . Years of education: 41  . Highest education level: Not on file  Occupational History  . Occupation: [employer]    Comment: Delivers medication  Tobacco Use  . Smoking status: Former Research scientist (life sciences)  . Smokeless tobacco: Never Used  . Tobacco comment: Quit 2004  Substance and Sexual Activity  . Alcohol use: Yes    Alcohol/week: 0.0 standard drinks    Comment: 3 drinks per week  . Drug use: No  . Sexual activity: Not on file  Other Topics Concern  . Not on file  Social History Narrative   Lives at home with his wife.   Right-handed.   2 diet cokes per day.   Social  Determinants of Health   Financial Resource Strain:   . Difficulty of Paying Living Expenses: Not on file  Food Insecurity:   . Worried About Charity fundraiser in the Last Year: Not on file  . Ran Out of Food in the Last Year: Not on file  Transportation Needs:   . Lack of Transportation (Medical): Not on file  . Lack of Transportation (Non-Medical): Not on file  Physical Activity:   . Days of Exercise per Week: Not on file  . Minutes of Exercise per Session: Not on file  Stress:   . Feeling of Stress : Not on file  Social Connections:   . Frequency of Communication  with Friends and Family: Not on file  . Frequency of Social Gatherings with Friends and Family: Not on file  . Attends Religious Services: Not on file  . Active Member of Clubs or Organizations: Not on file  . Attends Archivist Meetings: Not on file  . Marital Status: Not on file  Intimate Partner Violence:   . Fear of Current or Ex-Partner: Not on file  . Emotionally Abused: Not on file  . Physically Abused: Not on file  . Sexually Abused: Not on file    FAMILY HISTORY:  Family History  Problem Relation Age of Onset  . Alzheimer's disease Mother   . Diabetes Father   . Heart disease Father   . Stroke Father   . Heart attack Brother   . Colon cancer Brother     CURRENT MEDICATIONS:  @MEDCOMM @  ALLERGIES:  No Known Allergies   PHYSICAL EXAM:  ECOG Performance status: 1  Vitals:   12/02/19 2053 12/03/19 0349  BP:  (!) 147/83  Pulse:  (!) 53  Resp:  18  Temp:  98 F (36.7 C)  SpO2: 96% 97%   Filed Weights   11/30/19 1119 12/01/19 1217 12/01/19 1554  Weight: 207 lb (93.9 kg) 207 lb (93.9 kg) 210 lb 12.2 oz (95.6 kg)    Physical Exam Constitutional:      Appearance: Normal appearance. He is obese.  HENT:     Head: Normocephalic.     Right Ear: External ear normal.     Left Ear: External ear normal.     Nose: Nose normal.     Mouth/Throat:     Pharynx: Oropharynx is clear.  Eyes:     Conjunctiva/sclera: Conjunctivae normal.  Cardiovascular:     Rate and Rhythm: Normal rate and regular rhythm.     Pulses: Normal pulses.     Heart sounds: Normal heart sounds.  Pulmonary:     Effort: Pulmonary effort is normal.     Breath sounds: Normal breath sounds.  Abdominal:     General: Bowel sounds are normal.  Musculoskeletal:     Cervical back: Normal range of motion.  Skin:    General: Skin is warm.  Neurological:     General: No focal deficit present.     Mental Status: He is alert and oriented to person, place, and time.  Psychiatric:         Mood and Affect: Mood normal.        Behavior: Behavior normal.      LABORATORY DATA:  I have reviewed the labs as listed.  CBC    Component Value Date/Time   WBC 5.7 12/03/2019 1017   RBC 4.41 12/03/2019 1017   HGB 13.8 12/03/2019 1017   HCT 41.0 12/03/2019 1017   PLT 282 12/03/2019  1017   MCV 93.0 12/03/2019 1017   MCH 31.3 12/03/2019 1017   MCHC 33.7 12/03/2019 1017   RDW 13.0 12/03/2019 1017   LYMPHSABS 0.9 12/03/2019 1017   MONOABS 0.7 12/03/2019 1017   EOSABS 0.1 12/03/2019 1017   BASOSABS 0.0 12/03/2019 1017   CMP Latest Ref Rng & Units 11/30/2019  Glucose 70 - 99 mg/dL 124(H)  BUN 8 - 23 mg/dL 9  Creatinine 0.61 - 1.24 mg/dL 0.81  Sodium 135 - 145 mmol/L 141  Potassium 3.5 - 5.1 mmol/L 4.1  Chloride 98 - 111 mmol/L 104  CO2 22 - 32 mmol/L 25  Calcium 8.9 - 10.3 mg/dL 9.1  Total Protein 6.5 - 8.1 g/dL 6.7  Total Bilirubin 0.3 - 1.2 mg/dL 1.2  Alkaline Phos 38 - 126 U/L 38  AST 15 - 41 U/L 16  ALT 0 - 44 U/L 19       ASSESSMENT & PLAN:   1.  Esophageal adenocarcinoma -Symptoms noted October 2020 with dysphagia. -11/30/2019: CT chest: demonstrated asymmetric wall thickening of the distal esophagus concerning for mass.  Also, mediastinal lymphadenopathy noted.   -12/01/2019:  EGD was performed with biopsy.  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  -Discussed current findings with patient.  Patient will need full staging work-up including PET/CT.  If no evidence of metastatic disease patient may be a candidate for surgical resection. -PEG tube placement is not indicated, as this may interfere with potential subsequent resection. -We have scheduled the patient for PET/CT on December 07, 2018.  He will follow up in our clinic on December 09, 2018 20th.        [place] [phone]

## 2019-12-04 ENCOUNTER — Telehealth (HOSPITAL_COMMUNITY): Payer: Self-pay

## 2019-12-04 NOTE — Telephone Encounter (Signed)
Nutrition Assessment   Reason for Assessment:  Referral from Dr Raliegh Ip regarding weight loss, liquid diet education, new esophageal adenocarcinoma   ASSESSMENT:  68 year old male with new diagnosis of esophageal adenocarcinoma discovered during hospital admission.  Patient admitted to the hospital 12/27-12/30 due to dysphagia.  Planning PET scan on 1/4.  Noted feeding tube held during hospital admission due to wanting oncology work up.    Spoke with patient via phone.  Patient reports that prior to admission for the last month or so has been having more dysphagia especially of solid foods.  Reports 3-4 days prior to admission not really able to take liquids.  Patient reports since being discharged yesterday from hospital has been sipping on a variety of ensures (clear and milky) and boost shakes.  Reports getting in 4 yesterday.  Reports wife planning on preparing broths and jello.  Patient concerned about thickness of certain liquids (cream soups, puddings, etc).     Nutrition Focused Physical Exam: deferred   Medications: MVI, metformin   Labs: reviewed   Anthropometrics:   Height: 70 inches Weight: 210 lb 12/28 hospital weight, 207 lb prior to admission UBW: 224 lb 12/1 per patient  BMI: 30  6% weight loss in the last month, significant   Estimated Energy Needs  Kcals: [id number] Protein: 112-150 g Fluid: > 2 L   NUTRITION DIAGNOSIS: Inadequate oral intake related to esophageal cancer (obstruction) as evidenced by 6% weight loss in the last month, significant and only drinking liquids   INTERVENTION:  Recommend higher calorie shakes (350 calorie) 6 per day to better meet nutritional needs.  Patient concerned about thickness of shakes and we discussed ways to thin. Discussed juice type shakes with higher calories as well.  Encouraged trying protein powder to add to broths, jellos, etc.  Discussed thin as well as full liquids (pureed, creamed soups, puddings). Patient  concerned about thickness of full liquid foods.   Patient provided contact information    MONITORING, EVALUATION, GOAL: Patient will consume adequate calories and protein to maintain weight and preserve lean muscle mass.    Next Visit: phone f/u on Jan 15th  Daine Gunther B. Zenia Resides, [place], [place] Registered Dietitian [phone] (pager)

## 2019-12-08 ENCOUNTER — Ambulatory Visit (HOSPITAL_COMMUNITY)
Admission: RE | Admit: 2019-12-08 | Discharge: 2019-12-08 | Disposition: A | Discharge status: Home / Self Care | Payer: Medicare HMO | Source: Ambulatory Visit | Attending: Hematology | Admitting: Hematology

## 2019-12-08 ENCOUNTER — Other Ambulatory Visit: Payer: Self-pay

## 2019-12-08 DIAGNOSIS — C7951 Secondary malignant neoplasm of bone: Secondary | ICD-10-CM | POA: Diagnosis not present

## 2019-12-08 DIAGNOSIS — C160 Malignant neoplasm of cardia: Secondary | ICD-10-CM | POA: Insufficient documentation

## 2019-12-08 DIAGNOSIS — C159 Malignant neoplasm of esophagus, unspecified: Secondary | ICD-10-CM | POA: Diagnosis not present

## 2019-12-08 MED ORDER — FLUDEOXYGLUCOSE F - 18 (FDG) INJECTION
11.4000 | Freq: Once | INTRAVENOUS | Status: AC | PRN
  Administered 2019-12-08: 11.4 via INTRAVENOUS

## 2019-12-10 ENCOUNTER — Inpatient Hospital Stay (HOSPITAL_COMMUNITY): Payer: Medicare HMO | Attending: Hematology | Admitting: Hematology

## 2019-12-10 ENCOUNTER — Other Ambulatory Visit: Payer: Self-pay

## 2019-12-10 ENCOUNTER — Encounter (HOSPITAL_COMMUNITY): Payer: Self-pay | Admitting: Hematology

## 2019-12-10 VITALS — BP 101/56 | HR 59 | Temp 97.8°F | Resp 16 | Ht 70.0 in | Wt 213.7 lb

## 2019-12-10 DIAGNOSIS — Z800 Family history of malignant neoplasm of digestive organs: Secondary | ICD-10-CM | POA: Insufficient documentation

## 2019-12-10 DIAGNOSIS — I252 Old myocardial infarction: Secondary | ICD-10-CM | POA: Diagnosis not present

## 2019-12-10 DIAGNOSIS — C160 Malignant neoplasm of cardia: Secondary | ICD-10-CM

## 2019-12-10 DIAGNOSIS — F419 Anxiety disorder, unspecified: Secondary | ICD-10-CM | POA: Diagnosis not present

## 2019-12-10 DIAGNOSIS — C155 Malignant neoplasm of lower third of esophagus: Secondary | ICD-10-CM | POA: Diagnosis not present

## 2019-12-10 DIAGNOSIS — I10 Essential (primary) hypertension: Secondary | ICD-10-CM | POA: Diagnosis not present

## 2019-12-10 DIAGNOSIS — R1310 Dysphagia, unspecified: Secondary | ICD-10-CM | POA: Insufficient documentation

## 2019-12-10 DIAGNOSIS — Z7982 Long term (current) use of aspirin: Secondary | ICD-10-CM | POA: Diagnosis not present

## 2019-12-10 DIAGNOSIS — E1142 Type 2 diabetes mellitus with diabetic polyneuropathy: Secondary | ICD-10-CM | POA: Insufficient documentation

## 2019-12-10 DIAGNOSIS — M25552 Pain in left hip: Secondary | ICD-10-CM | POA: Diagnosis not present

## 2019-12-10 DIAGNOSIS — R634 Abnormal weight loss: Secondary | ICD-10-CM

## 2019-12-10 DIAGNOSIS — Z931 Gastrostomy status: Secondary | ICD-10-CM | POA: Insufficient documentation

## 2019-12-10 DIAGNOSIS — Z7984 Long term (current) use of oral hypoglycemic drugs: Secondary | ICD-10-CM | POA: Diagnosis not present

## 2019-12-10 DIAGNOSIS — G629 Polyneuropathy, unspecified: Secondary | ICD-10-CM | POA: Diagnosis not present

## 2019-12-10 DIAGNOSIS — Z87891 Personal history of nicotine dependence: Secondary | ICD-10-CM

## 2019-12-10 DIAGNOSIS — Z79899 Other long term (current) drug therapy: Secondary | ICD-10-CM | POA: Insufficient documentation

## 2019-12-10 DIAGNOSIS — C7951 Secondary malignant neoplasm of bone: Secondary | ICD-10-CM | POA: Insufficient documentation

## 2019-12-10 NOTE — Progress Notes (Signed)
[facility] NOTE  Patient Care Team: [name], MD as PCP - General (Family Medicine) Donetta Potts, RN as Oncology Nurse Navigator (Oncology) Derek Jack, MD as Consulting Physician (Oncology)  CHIEF COMPLAINTS/PURPOSE OF CONSULTATION:  Esophageal adenocarcinoma.  HISTORY OF PRESENTING ILLNESS:  Timothy Oconnor 69 y.o. male is seen in consultation today at the request of Dr. Laural Golden For further work-up and management of newly diagnosed GE junction adenocarcinoma.  He reported trouble swallowing starting around October 2020.  He had worsening dysphagia in December when he started regurgitating food occasionally.  It started out as a feeling of his pills sticking behind his sternum.  As his swelling got worse, he presented to the ER towards the end of December.  A CT scan of the chest on 11/30/2019 showed focal asymmetrical wall thickening of the distal esophagus raising concern for mass lesion.  Mild mediastinal adenopathy with upper normal lymph nodes in the gastrohepatic ligament.  He underwent EGD on 12/01/2019 which showed normal proximal and mid esophagus with food found in the mid esophagus.  Malignant appearing intrinsic severe stenosis found at 35-40 cm from the incisors.  Normal stomach, duodenal bulb and second part of the duodenum.  Biopsy of this mass was consistent with adenocarcinoma in the background of intestinal metaplasia consistent with Barrett's esophagitis.  He was started on a liquid diet and was discharged home.  He is currently drinking about 6 to 7 cans of boost per day with protein supplements.  He already met with our nutritionist.  He reported weight loss of 17 pounds in the last 2 months, mostly in December 2020.  His weight has been stable since his discharge from the hospital.  He reports chronic left hip pain for many years duration.  He is accompanied by his wife today.  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  He smoked 1 pack/day for 50 years and quit in 2017.  He drinks a few beers a week when he is playing golf.  Family history consistent with brother with colon cancer.  MEDICAL HISTORY:  Past Medical History:  Diagnosis Date  . 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 Diabetes ([place])   . Heart attack ([place])   . Heart disease   . High cholesterol   . Hyperplasia of prostate   . Hypertension   . Low back pain   . Tremor     SURGICAL HISTORY: Past Surgical History:  Procedure Laterality Date  . arterectomy     1993  . BIOPSY  12/01/2019   Procedure: BIOPSY;  Surgeon: Rogene Houston, MD;  Location[facility];  Service: Endoscopy;;  esophagus  . ESOPHAGOGASTRODUODENOSCOPY (EGD) WITH PROPOFOL N/A 12/01/2019   Procedure: ESOPHAGOGASTRODUODENOSCOPY (EGD) WITH PROPOFOL;  Surgeon: Rogene Houston, MD;  Location[facility];  Service: Endoscopy;  Laterality: N/A;  . Heart stents     2002, 2004, 2006    SOCIAL HISTORY: Social History   Socioeconomic History  . Marital status: Married    Spouse name: Not on file  . Number of children: 2  . Years of education: 23  . Highest education level: Not on file  Occupational History  . Occupation: [employer]    Comment: Delivers medication  Tobacco Use  . Smoking status: Former Smoker    Packs/day: 1.00    Years: 50.00    Pack years: 50.00  . Smokeless tobacco: Never  Used  . Tobacco comment: Quit 2017  Substance and Sexual Activity  . Alcohol use: Yes    Alcohol/week: 0.0 standard drinks    Comment: 3 drinks per week  . Drug use: No  . Sexual activity: Yes  Other Topics Concern  . Not on file  Social History Narrative   Lives at home with his wife.   Right-handed.   2 diet cokes per day.   Social Determinants of Health   Financial Resource Strain: Low Risk   . Difficulty of Paying Living Expenses: Not hard at all  Food Insecurity: No Food Insecurity  . Worried  About Charity fundraiser in the Last Year: Never true  . Ran Out of Food in the Last Year: Never true  Transportation Needs: No Transportation Needs  . Lack of Transportation (Medical): No  . Lack of Transportation (Non-Medical): No  Physical Activity: Insufficiently Active  . Days of Exercise per Week: 2 days  . Minutes of Exercise per Session: 30 min  Stress: No Stress Concern Present  . Feeling of Stress : Only a little  Social Connections: Not Isolated  . Frequency of Communication with Friends and Family: More than three times a week  . Frequency of Social Gatherings with Friends and Family: More than three times a week  . Attends Religious Services: 1 to 4 times per year  . Active Member of Clubs or Organizations: No  . Attends Archivist Meetings: 1 to 4 times per year  . Marital Status: Married  Human resources officer Violence: Not At Risk  . Fear of Current or Ex-Partner: No  . Emotionally Abused: No  . Physically Abused: No  . Sexually Abused: No    FAMILY HISTORY: Family History  Problem Relation Age of Onset  . Alzheimer's disease Mother   . Diabetes Father   . Heart disease Father   . Stroke Father   . Heart disease Brother   . Colon cancer Brother   . Alzheimer's disease Sister     ALLERGIES:  has No Known Allergies.  MEDICATIONS:  Current Outpatient Medications  Medication Sig Dispense Refill  . ascorbic acid (VITAMIN C) 500 MG tablet Take 500 mg by mouth daily.    Marland Kitchen aspirin EC 81 MG tablet Take 81 mg by mouth daily.     Marland Kitchen atorvastatin (LIPITOR) 80 MG tablet Take 80 mg by mouth every evening.     . ezetimibe (ZETIA) 10 MG tablet Take 10 mg by mouth daily.     . folic acid (FOLVITE) 161 MCG tablet Take 800 mcg by mouth daily.     . Glucosamine HCl (GLUCOSAMINE PO) Take 120 mg by mouth 2 (two) times daily.    . isosorbide mononitrate (IMDUR) 30 MG 24 hr tablet Take 30 mg by mouth daily.    Marland Kitchen losartan (COZAAR) 50 MG tablet Take 25 mg by mouth daily.      . metFORMIN (GLUCOPHAGE-XR) 500 MG 24 hr tablet Take 500 mg by mouth daily with breakfast.    . metoprolol tartrate (LOPRESSOR) 25 MG tablet Take 0.5 tablets (12.5 mg total) by mouth 2 (two) times daily. 60 tablet 1  . Multiple Vitamin (MULTI-VITAMINS) TABS Take 1 tablet by mouth daily.     . Omega-3 Fatty Acids (FISH OIL) 1000 MG CAPS Take 1 capsule by mouth 2 (two) times daily.     . Saw Palmetto 450 MG CAPS Take 1 capsule by mouth daily.     . tamsulosin (FLOMAX)  0.4 MG CAPS capsule Take 0.4 mg by mouth daily.      No current facility-administered medications for this visit.    REVIEW OF SYSTEMS:   Constitutional: Denies fevers, chills or abnormal night sweats positive for fatigue. Eyes: Denies blurriness of vision, double vision or watery eyes Ears, nose, mouth, throat, and face: Denies mucositis or sore throat Respiratory: Denies cough, dyspnea or wheezes Cardiovascular: Denies palpitation, chest discomfort or lower extremity swelling Gastrointestinal:  Denies nausea, heartburn or change in bowel habits Skin: Denies abnormal skin rashes Lymphatics: Denies new lymphadenopathy or easy bruising Neurological:Denies numbness, tingling or new weaknesses Behavioral/Psych: Mood is stable, no new changes  All other systems were reviewed with the patient and are negative.  PHYSICAL EXAMINATION: ECOG PERFORMANCE STATUS: 1 - Symptomatic but completely ambulatory  Vitals:   12/10/19 1337  BP: (!) 101/56  Pulse: (!) 59  Resp: 16  Temp: 97.8 F (36.6 C)  SpO2: 98%   Filed Weights   12/10/19 1337  Weight: 213 lb 11.2 oz (96.9 kg)    GENERAL:alert, no distress and comfortable SKIN: skin color, texture, turgor are normal, no rashes or significant lesions EYES: normal, conjunctiva are pink and non-injected, sclera clear OROPHARYNX:no exudate, no erythema and lips, buccal mucosa, and tongue normal  NECK: supple, thyroid normal size, non-tender, without nodularity LYMPH:  no palpable  lymphadenopathy in the cervical, axillary or inguinal LUNGS: clear to auscultation and percussion with normal breathing effort HEART: regular rate & rhythm and no murmurs and no lower extremity edema ABDOMEN:abdomen soft, non-tender and normal bowel sounds Musculoskeletal:no cyanosis of digits and no clubbing  PSYCH: alert & oriented x 3 with fluent speech NEURO: no focal motor/sensory deficits  LABORATORY DATA:  I have reviewed the data as listed Lab Results  Component Value Date   WBC 5.7 12/03/2019   HGB 13.8 12/03/2019   HCT 41.0 12/03/2019   MCV 93.0 12/03/2019   PLT 282 12/03/2019     Chemistry      Component Value Date/Time   NA 141 11/30/2019 1158   K 4.1 11/30/2019 1158   CL 104 11/30/2019 1158   CO2 25 11/30/2019 1158   BUN 9 11/30/2019 1158   CREATININE 0.81 11/30/2019 1158      Component Value Date/Time   CALCIUM 9.1 11/30/2019 1158   ALKPHOS 38 11/30/2019 1158   AST 16 11/30/2019 1158   ALT 19 11/30/2019 1158   BILITOT 1.2 11/30/2019 1158       RADIOGRAPHIC STUDIES: I have personally reviewed the radiological images as listed and agreed with the findings in the report. CT Chest W Contrast  Result Date: 11/30/2019 CLINICAL DATA:  2 month history of difficulty swallowing. Mid chest pain with swallowing. EXAM: CT CHEST WITH CONTRAST TECHNIQUE: Multidetector CT imaging of the chest was performed during intravenous contrast administration. CONTRAST:  74m OMNIPAQUE IOHEXOL 300 MG/ML  SOLN COMPARISON:  None. FINDINGS: Cardiovascular: The heart size is normal. No substantial pericardial effusion. Coronary artery calcification is evident. Atherosclerotic calcification is noted in the wall of the thoracic aorta. Mediastinum/Nodes: 15 mm short axis AP window lymph node is associated with 18 mm short axis subcarinal node. 9 mm short axis retroesophageal node visible on image 95/2. Asymmetric relatively focal wall thickening is noted in the distal esophagus (image  114/series 2) with several small distal paraesophageal and gastrohepatic ligament lymph nodes evident. 12 mm short axis paraspinal node identified adjacent to the descending aorta on 01/30 9/2. Lungs/Pleura: Areas of clustered ill-defined tree-in-bud nodularity  are seen in the upper and lower lobes bilaterally. These nodular components measure up to 10 mm in the right lower lobe (97/4) no pleural effusion. Upper Abdomen: 2.6 cm cyst identified upper pole left kidney. As noted above, upper normal lymph nodes are seen in the gastrohepatic ligament. Musculoskeletal: No worrisome lytic or sclerotic osseous abnormality. IMPRESSION: 1. Relatively focal, asymmetric wall thickening distal esophagus raises concern for mass lesion. There is some minimal fluid/debris in the esophageal lumen proximal to the distal wall thickening. 2. Ill-defined tree-in-bud nodularity in all lobes of both lungs some of the nodular components measuring up to 10 mm. Distribution favors atypical infection including MAI although aspiration could have this appearance. 3. Mild mediastinal lymphadenopathy with upper normal lymph nodes in the gastrohepatic ligament. If the distal esophageal wall thickening represents neoplasm, metastatic disease would be a concern. This could also be reactive to the bilateral lung disease. Electronically Signed   By: Misty Stanley M.D.   On: 11/30/2019 13:59   NM PET Image Initial (PI) Skull Base To Thigh  Result Date: 12/09/2019 CLINICAL DATA:  Initial treatment strategy for adenocarcinoma of the gastroesophageal junction. EXAM: NUCLEAR MEDICINE PET SKULL BASE TO THIGH TECHNIQUE: 11.4 mCi F-18 FDG was injected intravenously. Full-ring PET imaging was performed from the skull base to thigh after the radiotracer. CT data was obtained and used for attenuation correction and anatomic localization. Fasting blood glucose: 117 mg/dl COMPARISON:  Chest CT 11/30/2019 FINDINGS: Mediastinal blood pool activity: SUV max 3.1  Liver activity: SUV max NA NECK: No hypermetabolic lymph nodes in the neck. Incidental CT findings: none CHEST: Known distal esophageal lesion is markedly hypermetabolic with SUV max = 66.4. Hypermetabolic bilateral supraclavicular and mediastinal lymphadenopathy evident. Supraclavicular disease noted with with SUV max = 6.9 on the right. 2.0 cm short axis AP window lymph node (109/3) shows SUV max = 13.3. 1.6 cm short axis subcarinal node demonstrates SUV max = 7.6. 1.3 cm short axis right retrocrural node demonstrates SUV max = 9.5. Incidental CT findings: Coronary artery calcification is evident. Atherosclerotic calcification is noted in the wall of the thoracic aorta. The relatively widespread tree-in-bud nodularity seen on the previous CT has essentially resolved in the interval. ABDOMEN/PELVIS: No evidence for hypermetabolic liver metastases. Hypermetabolic gastrohepatic ligament and para-aortic lymph nodes are evident. Left para-aortic node posterior to the left renal vein (206/3) demonstrates SUV max = 9.9. 1.3 cm short axis retrocaval node on 202/3 demonstrates SUV max = 5.7. Incidental CT findings: Small cyst noted upper pole left kidney. There is abdominal aortic atherosclerosis without aneurysm. Diverticular changes noted left colon without diverticulitis. Prostate gland is markedly enlarged. SKELETON: Scattered hypermetabolic lesions are noted within the bony anatomy. Hypermetabolic lesion in the right femoral neck has no visible CT correlate in demonstrates SUV max = 10.3. Anterior left iliac lesion demonstrates SUV max = 7.3 with no CT lesion evident. Hypermetabolic lesion noted L2 vertebral body and probably in the right transverse process of T2. Incidental CT findings: none IMPRESSION: 1. Known distal esophageal neoplasm is markedly hypermetabolic. 2. Hypermetabolic nodal metastases in the supraclavicular regions, mediastinum and upper abdomen. No evidence for hypermetabolic metastatic disease in  the liver. 3. Scattered hypermetabolic bone metastases. 4. The relatively diffuse tree-in-bud opacity seen in both lungs on the previous chest CT has essentially resolved in the interval. Fine detail on today's CT data is somewhat obscured by non breath hold technique. 5. Left colonic diverticulosis. 6. Prostatomegaly. 7.  Aortic Atherosclerois (ICD10-170.0) Electronically Signed   By: Randall Hiss  Tery Sanfilippo M.D.   On: 12/09/2019 08:17    ASSESSMENT & PLAN:  Esophageal cancer ([place]) 1.  Distal esophageal adenocarcinoma: -He reported trouble swallowing starting around October 2020. -He had worsening of dysphagia in December when he had to regurgitate food occasionally. -Weight loss of 17 pounds in the last 2 months, most of it in December. -EGD on 12/01/2019 shows normal proximal and mid esophagus with food found in the midesophagus.  Malignant appearing intrinsic severe stenosis found at 35-40 cm from the incisors.  Stenosis measured 1 cm in length.  Normal stomach, duodenal bulb and second part of duodenum. -Biopsy consistent with adenocarcinoma in the background of intestinal metaplasia consistent with Barrett's esophagus. -PET CT scan on 12/08/2019 shows known distal esophageal neoplasm with hypermetabolic nodal metastasis in the supraclavicular region, mediastinum and upper abdomen.  No hypermetabolic metastatic disease in the liver.  Hypermetabolic lesion in the right femoral neck with no CT correlate.  Anterior left iliac lesion with no CT lesion evident.  Hypermetabolism noted at L2 vertebral body probably in the right transverse process of T2. -As the bone lesions does not have CT correlate, I have recommended doing a MRI of the pelvis with and without contrast.  This will help [place] rule out metastatic disease. -We will proceed with testing for NGS, PD-L1, HER-2 by IHC and FISH. -We will also make referral for port placement for Dr. Arnoldo Morale. -I will also obtain echocardiogram from 2018 by Dr. Fransisca Kaufmann at [facility].  2.  Weight loss: -She reported 17 pound weight loss in the last 2 months. -Unable to eat any solid foods. -He is drinking 6 to 7 cans of boost per day.  He is maintaining weight since discharge from the hospital. -He already met with our nutritionist.  If there is evidence of metastatic disease, we will consider PEG tube placement.  3.  Peripheral neuropathy: -He has mild neuropathy in the feet from underlying diabetes.  Not affecting his gait.  Orders Placed This Encounter  Procedures  . MR PELVIS W WO CONTRAST    Standing Status:   Future    Standing Expiration Date:   02/06/2021    Order Specific Question:   ** REASON FOR EXAM (FREE TEXT)    Answer:   gastroesophageal junction adenocarcinoma    Order Specific Question:   If indicated for the ordered procedure, I authorize the administration of contrast media per Radiology protocol    Answer:   Yes    Order Specific Question:   What is the patient's sedation requirement?    Answer:   No Sedation    Order Specific Question:   Does the patient have a pacemaker or implanted devices?    Answer:   No    Order Specific Question:   Radiology Contrast Protocol - do NOT remove file path    Answer:   [email or web address]    Order Specific Question:   Preferred imaging location?    Answer:   [facility] (table limit-350lbs)    All questions were answered. The patient knows to call the clinic with any problems, questions or concerns.      Derek Jack, MD 12/10/2019 5:48 PM

## 2019-12-10 NOTE — Patient Instructions (Addendum)
[place] at [facility] Discharge Instructions  You were seen today by Dr. Delton Coombes. He went over your history,family history and how you've been feeling lately. He will schedule you for a MRI. He will see you back after your scan for follow up.   Thank you for choosing [place] at [facility] to provide your oncology and hematology care.  To afford each patient quality time with our provider, please arrive at least 15 minutes before your scheduled appointment time.   If you have a lab appointment with the [facility] please come in thru the  Main Entrance and check in at the main information desk  You need to re-schedule your appointment should you arrive 10 or more minutes late.  We strive to give you quality time with our providers, and arriving late affects you and other patients whose appointments are after yours.  Also, if you no show three or more times for appointments you may be dismissed from the clinic at the providers discretion.     Again, thank you for choosing [facility].  Our hope is that these requests will decrease the amount of time that you wait before being seen by our physicians.       _____________________________________________________________  Should you have questions after your visit to [facility], please contact our office at [phone] between the hours of 8:00 a.m. and 4:30 p.m.  Voicemails left after 4:00 p.m. will not be returned until the following business day.  For prescription refill requests, have your pharmacy contact our office and allow 72 hours.    Cancer Center Support Programs:   > Cancer Support Group  2nd Tuesday of the month 1pm-2pm, Journey Room

## 2019-12-10 NOTE — Assessment & Plan Note (Addendum)
1.  Distal esophageal adenocarcinoma: -He reported trouble swallowing starting around October 2020. -He had worsening of dysphagia in December when he had to regurgitate food occasionally. -Weight loss of 17 pounds in the last 2 months, most of it in December. -EGD on 12/01/2019 shows normal proximal and mid esophagus with food found in the midesophagus.  Malignant appearing intrinsic severe stenosis found at 35-40 cm from the incisors.  Stenosis measured 1 cm in length.  Normal stomach, duodenal bulb and second part of duodenum. -Biopsy consistent with adenocarcinoma in the background of intestinal metaplasia consistent with Barrett's esophagus. -PET CT scan on 12/08/2019 shows known distal esophageal neoplasm with hypermetabolic nodal metastasis in the supraclavicular region, mediastinum and upper abdomen.  No hypermetabolic metastatic disease in the liver.  Hypermetabolic lesion in the right femoral neck with no CT correlate.  Anterior left iliac lesion with no CT lesion evident.  Hypermetabolism noted at L2 vertebral body probably in the right transverse process of T2. -As the bone lesions does not have CT correlate, I have recommended doing a MRI of the pelvis with and without contrast.  This will help [place] rule out metastatic disease. -We will proceed with testing for NGS, PD-L1, HER-2 by IHC and FISH. -We will also make referral for port placement for Dr. Arnoldo Morale. -I will also obtain echocardiogram from 2018 by Dr. Fransisca Kaufmann at [facility].  2.  Weight loss: -She reported 17 pound weight loss in the last 2 months. -Unable to eat any solid foods. -He is drinking 6 to 7 cans of boost per day.  He is maintaining weight since discharge from the hospital. -He already met with our nutritionist.  If there is evidence of metastatic disease, we will consider PEG tube placement.  3.  Peripheral neuropathy: -He has mild neuropathy in the feet from underlying diabetes.  Not affecting his gait.

## 2019-12-11 ENCOUNTER — Other Ambulatory Visit: Payer: Self-pay

## 2019-12-11 ENCOUNTER — Encounter (HOSPITAL_COMMUNITY): Payer: Self-pay | Admitting: *Deleted

## 2019-12-11 NOTE — Progress Notes (Unsigned)
I emailed pathology and requested foundation one, PD-L1, HER2 by IHC and FISH.  I have asked that they let us know if there is adequate tissue.

## 2019-12-12 ENCOUNTER — Telehealth (HOSPITAL_COMMUNITY): Payer: Self-pay

## 2019-12-12 LAB — SURGICAL PATHOLOGY

## 2019-12-12 NOTE — Telephone Encounter (Signed)
Nutrition  Patient left voicemail for RD.   RD was able to call patient back this am regarding questions on protein powders.  Patient has purchased a variety of shakes (clear and milky).  Drinking some of the boost VHC (500 calories), ensure enlive, ensure clear.  Also making smoothies and adding protein powder.    Next visit: Jan 15th phone f/u  Gibson Lad B. Zenia Resides, [place], [place] Registered Dietitian [phone] (pager)

## 2019-12-14 ENCOUNTER — Other Ambulatory Visit: Payer: Self-pay

## 2019-12-14 ENCOUNTER — Emergency Department (HOSPITAL_COMMUNITY): Payer: Medicare HMO

## 2019-12-14 ENCOUNTER — Emergency Department (HOSPITAL_COMMUNITY)
Admission: EM | Admit: 2019-12-14 | Discharge: 2019-12-14 | Disposition: A | Discharge status: Home / Self Care | Payer: Medicare HMO | Attending: Emergency Medicine | Admitting: Emergency Medicine

## 2019-12-14 ENCOUNTER — Encounter (HOSPITAL_COMMUNITY): Payer: Self-pay

## 2019-12-14 DIAGNOSIS — Z79899 Other long term (current) drug therapy: Secondary | ICD-10-CM | POA: Insufficient documentation

## 2019-12-14 DIAGNOSIS — R0902 Hypoxemia: Secondary | ICD-10-CM | POA: Diagnosis not present

## 2019-12-14 DIAGNOSIS — E785 Hyperlipidemia, unspecified: Secondary | ICD-10-CM | POA: Diagnosis not present

## 2019-12-14 DIAGNOSIS — R079 Chest pain, unspecified: Secondary | ICD-10-CM | POA: Diagnosis not present

## 2019-12-14 DIAGNOSIS — Z87891 Personal history of nicotine dependence: Secondary | ICD-10-CM | POA: Diagnosis not present

## 2019-12-14 DIAGNOSIS — E119 Type 2 diabetes mellitus without complications: Secondary | ICD-10-CM | POA: Diagnosis not present

## 2019-12-14 DIAGNOSIS — C159 Malignant neoplasm of esophagus, unspecified: Secondary | ICD-10-CM | POA: Insufficient documentation

## 2019-12-14 DIAGNOSIS — I214 Non-ST elevation (NSTEMI) myocardial infarction: Secondary | ICD-10-CM | POA: Diagnosis not present

## 2019-12-14 DIAGNOSIS — Z20822 Contact with and (suspected) exposure to covid-19: Secondary | ICD-10-CM | POA: Insufficient documentation

## 2019-12-14 DIAGNOSIS — R0689 Other abnormalities of breathing: Secondary | ICD-10-CM | POA: Diagnosis not present

## 2019-12-14 DIAGNOSIS — Z7982 Long term (current) use of aspirin: Secondary | ICD-10-CM | POA: Diagnosis not present

## 2019-12-14 DIAGNOSIS — I10 Essential (primary) hypertension: Secondary | ICD-10-CM | POA: Insufficient documentation

## 2019-12-14 DIAGNOSIS — I2510 Atherosclerotic heart disease of native coronary artery without angina pectoris: Secondary | ICD-10-CM | POA: Diagnosis not present

## 2019-12-14 DIAGNOSIS — N400 Enlarged prostate without lower urinary tract symptoms: Secondary | ICD-10-CM | POA: Diagnosis not present

## 2019-12-14 DIAGNOSIS — I4891 Unspecified atrial fibrillation: Secondary | ICD-10-CM | POA: Diagnosis not present

## 2019-12-14 DIAGNOSIS — R0602 Shortness of breath: Secondary | ICD-10-CM | POA: Diagnosis not present

## 2019-12-14 DIAGNOSIS — Z955 Presence of coronary angioplasty implant and graft: Secondary | ICD-10-CM | POA: Diagnosis not present

## 2019-12-14 DIAGNOSIS — R1310 Dysphagia, unspecified: Secondary | ICD-10-CM | POA: Diagnosis not present

## 2019-12-14 DIAGNOSIS — R0789 Other chest pain: Secondary | ICD-10-CM | POA: Diagnosis not present

## 2019-12-14 DIAGNOSIS — I252 Old myocardial infarction: Secondary | ICD-10-CM | POA: Diagnosis not present

## 2019-12-14 DIAGNOSIS — I25118 Atherosclerotic heart disease of native coronary artery with other forms of angina pectoris: Secondary | ICD-10-CM | POA: Diagnosis not present

## 2019-12-14 LAB — CBC
HCT: 41.7 % (ref 39.0–52.0)
Hemoglobin: 13.6 g/dL (ref 13.0–17.0)
MCH: 31.6 pg (ref 26.0–34.0)
MCHC: 32.6 g/dL (ref 30.0–36.0)
MCV: 96.8 fL (ref 80.0–100.0)
Platelets: 289 10*3/uL (ref 150–400)
RBC: 4.31 MIL/uL (ref 4.22–5.81)
RDW: 13 % (ref 11.5–15.5)
WBC: 7 10*3/uL (ref 4.0–10.5)
nRBC: 0 % (ref 0.0–0.2)

## 2019-12-14 LAB — BASIC METABOLIC PANEL
Anion gap: 4 — ABNORMAL LOW (ref 5–15)
BUN: 16 mg/dL (ref 8–23)
CO2: 28 mmol/L (ref 22–32)
Calcium: 8.8 mg/dL — ABNORMAL LOW (ref 8.9–10.3)
Chloride: 106 mmol/L (ref 98–111)
Creatinine, Ser: 0.76 mg/dL (ref 0.61–1.24)
GFR calc Af Amer: >60 mL/min (ref >60)
GFR calc non Af Amer: >60 mL/min (ref >60)
Glucose, Bld: 182 mg/dL — ABNORMAL HIGH (ref 70–99)
Potassium: 4.1 mmol/L (ref 3.5–5.1)
Sodium: 138 mmol/L (ref 135–145)

## 2019-12-14 LAB — APTT: aPTT: 37 seconds — ABNORMAL HIGH (ref 24–36)

## 2019-12-14 LAB — TROPONIN I (HIGH SENSITIVITY)
Troponin I (High Sensitivity): 15 ng/L (ref <18)
Troponin I (High Sensitivity): 87 ng/L — ABNORMAL HIGH (ref <18)

## 2019-12-14 LAB — RESPIRATORY PANEL BY RT PCR (FLU A&B, COVID)
Influenza A by PCR: NEGATIVE
Influenza B by PCR: NEGATIVE
SARS Coronavirus 2 by RT PCR: NEGATIVE

## 2019-12-14 LAB — POC SARS CORONAVIRUS 2 AG -  ED: SARS Coronavirus 2 Ag: NEGATIVE

## 2019-12-14 MED ORDER — HEPARIN (PORCINE) 25000 UT/250ML-% IV SOLN
1300.0000 [IU]/h | INTRAVENOUS | Status: DC
  Administered 2019-12-14: 1300 [IU]/h via INTRAVENOUS
  Filled 2019-12-14: qty 250

## 2019-12-14 MED ORDER — HEPARIN BOLUS VIA INFUSION
4000.0000 [IU] | Freq: Once | INTRAVENOUS | Status: AC
  Administered 2019-12-14: 4000 [IU] via INTRAVENOUS

## 2019-12-14 MED ORDER — NITROGLYCERIN 2 % TD OINT
1.0000 [in_us] | TOPICAL_OINTMENT | Freq: Once | TRANSDERMAL | Status: AC
  Administered 2019-12-14: 1 [in_us] via TOPICAL
  Filled 2019-12-14: qty 1

## 2019-12-14 NOTE — Progress Notes (Signed)
ANTICOAGULATION CONSULT NOTE - Follow Up Consult  Pharmacy Consult for Heparin Indication: chest pain/ACS  No Known Allergies  Patient Measurements: Height: 5\' 10"  (177.8 cm) Weight: 213 lb 11.2 oz (96.9 kg) IBW/kg (Calculated) : 73 Heparin Dosing Weight: 94 kg  Vital Signs: Temp: 97.9 F (36.6 C) (01/10 0230) Temp Source: Oral (01/10 0230) BP: 105/66 (01/10 0530) Pulse Rate: 53 (01/10 0530)  Labs: Recent Labs    12/14/19 0230 12/14/19 0500  HGB 13.6  --   HCT 41.7  --   PLT 289  --   CREATININE 0.76  --   TROPONINIHS 15 87*    Estimated Creatinine Clearance: 100.4 mL/min (by C-G formula based on SCr of 0.76 mg/dL).    Assessment: 69 y.o, M presents with CP. To begin heparin per pharmacy. No AC PTA. CBC stable.  Goal of Therapy:  Heparin level 0.3-0.7 units/ml Monitor platelets by anticoagulation protocol: Yes   Plan:  Heparin IV bolus 4000 units Heparin gtt at 1300 units/hr Will f/u heparin level in 8 hours Daily heparin level and CBC  Sherlon Handing, PharmD, BCPS Please see amion for complete clinical pharmacist phone list 12/14/2019,5:53 AM

## 2019-12-14 NOTE — ED Triage Notes (Signed)
Pt c/o midchest CP starting a couple hours ago. Took 1 NTG and  324 baby aspirin pta. A fib c pvcs on ems monitors. 2/10 CP currently.   18 R AC.

## 2019-12-14 NOTE — ED Provider Notes (Signed)
[facility] EMERGENCY DEPARTMENT Provider Note   CSN: [id number] Arrival date & time: 12/14/19  0222   Time seen 2:37 AM  History Chief Complaint  Patient presents with  . Chest Pain    Timothy Oconnor is a 69 y.o. male.  HPI   Patient states he went to bed but his left hip was bothering him which he has been having.  He states he got up and then he went back to bed.  At about 12:30 AM he started having central chest pain that he thought was indigestion and described it as a heaviness.  He states at its worst it was a 6 out of 10.  He states he took Gaviscon and thought it helped however the pain returned and got worse.  He took 1 nitroglycerin which seemed to help.  He states he did have a diaphoretic episode, but he denies nausea or vomiting.  He had mild shortness of breath but denies any radiation of the pain.  EMS was called and he said on their arrival his pain was a 3 out of 10.  He was given 3 baby aspirin.  He states currently his discomfort is a possible 1 out of 10.  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  He states he has 7 stents the last one was about 2 years ago.  He is followed at [facility].  He states this is the first episode of chest pain he has had since his last stent although he was seen in December when he was having dysphagia and had some chest pain when he had some food stuck in his esophagus.  That was about 2 weeks ago when he was diagnosed with esophageal cancer.  He had a PET scan earlier this week and he is going to have a MRI of his right hip done later this week and and appointment with Dr. Arnoldo Morale to get a port for chemotherapy.  He states he has been on a liquid diet for 2 weeks and is drinking a lot of boost and Ensure.  PCP [name], MD Cardiology Dr Clovia Cuff at [facility] Dr Delton Coombes  Past Medical History:  Diagnosis Date  . 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 Diabetes ([place])   . Heart attack ([place])   . Heart disease   . High cholesterol   . Hyperplasia of prostate   . Hypertension   . Low back pain   . Tremor     Patient Active Problem List   Diagnosis Date Noted  . Esophageal cancer ([place])   . Dysphagia 11/30/2019  . HTN (hypertension) 11/30/2019  . Type 2 diabetes mellitus without complication ([place]) 02/72/5366  . HLD (hyperlipidemia) 11/30/2019  . BPH (benign prostatic hyperplasia) 11/30/2019  . Tremor 12/08/2015    Past Surgical History:  Procedure Laterality Date  . arterectomy     1993  . BIOPSY  12/01/2019   Procedure: BIOPSY;  Surgeon: Rogene Houston, MD;  Location: [facility];  Service: Endoscopy;;  esophagus  . ESOPHAGOGASTRODUODENOSCOPY (EGD) WITH PROPOFOL N/A 12/01/2019   Procedure: ESOPHAGOGASTRODUODENOSCOPY (EGD) WITH PROPOFOL;  Surgeon: Rogene Houston, MD;  Location: [facility];  Service: Endoscopy;  Laterality: N/A;  . Heart stents     2002, 2004, 2006       Family History  Problem Relation Age of Onset  . Alzheimer's disease Mother   .  Diabetes Father   . Heart disease Father   . Stroke Father   . Heart disease Brother   . Colon cancer Brother   . Alzheimer's disease Sister     Social History   Tobacco Use  . Smoking status: Former Smoker    Packs/day: 1.00    Years: 50.00    Pack years: 50.00  . Smokeless tobacco: Never Used  . Tobacco comment: Quit 2017  Substance Use Topics  . Alcohol use: Yes    Alcohol/week: 0.0 standard drinks    Comment: 3 drinks per week  . Drug use: No  lives with spouse Quit smoking 2 years ago States he drinks 3 drinks a week  Home Medications Prior to Admission medications   Medication Sig Start Date End Date Taking? Authorizing Provider  ascorbic acid (VITAMIN C) 500 MG tablet Take 500 mg by mouth daily.    [provider]  aspirin EC 81 MG tablet Take 81 mg by mouth daily.     [provider]  atorvastatin (LIPITOR) 80 MG  tablet Take 80 mg by mouth every evening.  12/25/11   [provider]  ezetimibe (ZETIA) 10 MG tablet Take 10 mg by mouth daily.  12/25/11   [provider]  folic acid (FOLVITE) 782 MCG tablet Take 800 mcg by mouth daily.     [provider]  Glucosamine HCl (GLUCOSAMINE PO) Take 120 mg by mouth 2 (two) times daily.    [provider]  isosorbide mononitrate (IMDUR) 30 MG 24 hr tablet Take 30 mg by mouth daily. 09/23/19   [provider]  losartan (COZAAR) 50 MG tablet Take 25 mg by mouth daily.  12/02/11   [provider]  metFORMIN (GLUCOPHAGE-XR) 500 MG 24 hr tablet Take 500 mg by mouth daily with breakfast.    [provider]  metoprolol tartrate (LOPRESSOR) 25 MG tablet Take 0.5 tablets (12.5 mg total) by mouth 2 (two) times daily. 12/03/19   Orson Eva, MD  Multiple Vitamin (MULTI-VITAMINS) TABS Take 1 tablet by mouth daily.     [provider]  Omega-3 Fatty Acids (FISH OIL) 1000 MG CAPS Take 1 capsule by mouth 2 (two) times daily.     [provider]  Saw Palmetto 450 MG CAPS Take 1 capsule by mouth daily.     [provider]  tamsulosin (FLOMAX) 0.4 MG CAPS capsule Take 0.4 mg by mouth daily.  12/02/11   [provider]    Allergies    Patient has no known allergies.  Review of Systems   Review of Systems  All other systems reviewed and are negative.   Physical Exam Updated Vital Signs BP 114/69   Pulse 65   Temp 97.9 F (36.6 C) (Oral)   Resp 13   Ht _0  (1.778 m)   Wt 96.9 kg   SpO2 100%   BMI 30.66 kg/m   Vital signs normal    Physical Exam Vitals and nursing note reviewed.  Constitutional:      General: He is not in acute distress.    Appearance: Normal appearance. He is well-developed. He is not ill-appearing or toxic-appearing.  HENT:     Head: Normocephalic and atraumatic.     Right Ear: External ear normal.     Left Ear: External ear normal.     Nose:  Nose normal. No mucosal edema or rhinorrhea.     Mouth/Throat:     Mouth: Mucous membranes are moist.  Dentition: No dental abscesses.     Pharynx: No oropharyngeal exudate, posterior oropharyngeal erythema or uvula swelling.  Eyes:     Extraocular Movements: Extraocular movements intact.     Conjunctiva/sclera: Conjunctivae normal.     Pupils: Pupils are equal, round, and reactive to light.  Cardiovascular:     Rate and Rhythm: Normal rate and regular rhythm. Occasional extrasystoles are present.    Heart sounds: Normal heart sounds. No murmur. No friction rub. No gallop.   Pulmonary:     Effort: Pulmonary effort is normal. No respiratory distress.     Breath sounds: Normal breath sounds. No wheezing, rhonchi or rales.  Chest:     Chest wall: No tenderness or crepitus.  Abdominal:     General: Bowel sounds are normal. There is no distension.     Palpations: Abdomen is soft.     Tenderness: There is no abdominal tenderness. There is no guarding or rebound.  Musculoskeletal:        General: No tenderness. Normal range of motion.     Cervical back: Full passive range of motion without pain, normal range of motion and neck supple.     Right lower leg: No edema.     Left lower leg: No edema.     Comments: Moves all extremities well.   Skin:    General: Skin is warm and dry.     Coloration: Skin is not pale.     Findings: No erythema or rash.  Neurological:     General: No focal deficit present.     Mental Status: He is alert and oriented to person, place, and time.     Cranial Nerves: No cranial nerve deficit.  Psychiatric:        Mood and Affect: Mood normal. Mood is not anxious.        Speech: Speech normal.        Behavior: Behavior normal.        Thought Content: Thought content normal.     ED Results / Procedures / Treatments   Labs (all labs ordered are listed, but only abnormal results are displayed) Results for orders placed or performed during the hospital  encounter of 76/72/09  Basic metabolic panel  Result Value Ref Range   Sodium 138 135 - 145 mmol/L   Potassium 4.1 3.5 - 5.1 mmol/L   Chloride 106 98 - 111 mmol/L   CO2 28 22 - 32 mmol/L   Glucose, Bld 182 (H) 70 - 99 mg/dL   BUN 16 8 - 23 mg/dL   Creatinine, Ser 0.76 0.61 - 1.24 mg/dL   Calcium 8.8 (L) 8.9 - 10.3 mg/dL   GFR calc non Af Amer >60 >60 mL/min   GFR calc Af Amer >60 >60 mL/min   Anion gap 4 (L) 5 - 15  CBC  Result Value Ref Range   WBC 7.0 4.0 - 10.5 K/uL   RBC 4.31 4.22 - 5.81 MIL/uL   Hemoglobin 13.6 13.0 - 17.0 g/dL   HCT 41.7 39.0 - 52.0 %   MCV 96.8 80.0 - 100.0 fL   MCH 31.6 26.0 - 34.0 pg   MCHC 32.6 30.0 - 36.0 g/dL   RDW 13.0 11.5 - 15.5 %   Platelets 289 150 - 400 K/uL   nRBC 0.0 0.0 - 0.2 %  APTT  Result Value Ref Range   aPTT 37 (H) 24 - 36 seconds  POC SARS Coronavirus 2 Ag-ED - Nasal Swab (BD Veritor Kit)  Result Value Ref Range   SARS Coronavirus 2 Ag NEGATIVE NEGATIVE  Troponin I (High Sensitivity)  Result Value Ref Range   Troponin I (High Sensitivity) 15 <18 ng/L  Troponin I (High Sensitivity)  Result Value Ref Range   Troponin I (High Sensitivity) 87 (H) <18 ng/L   Laboratory interpretation all normal except positive delta troponin, hyperglycemia    EKG EKG Interpretation  Date/Time:  Sunday December 14 2019 02:30:02 EST Ventricular Rate:  65 PR Interval:    QRS Duration: 109 QT Interval:  405 QTC Calculation: 422 R Axis:   64 Text Interpretation: Sinus rhythm peaked T waves in septal leads No old tracing to compare Confirmed by Rolland Porter [phone]) on 12/14/2019 2:34:13 AM   #2 EKG  EKG Interpretation  Date/Time:  Sunday December 14 2019 06:06:43 EST Ventricular Rate:  62 PR Interval:    QRS Duration: 97 QT Interval:  422 QTC Calculation: 429 R Axis:   70 Text Interpretation: Sinus rhythm persistent peaked T waves septally since EKG done about 3.5 hrs earlier Confirmed by Rolland Porter [phone]) on 12/14/2019 6:30:41 AM         Radiology DG Chest 2 View  Result Date: 12/14/2019 CLINICAL DATA:  69 year old male with chest pain. EXAM: CHEST - 2 VIEW COMPARISON:  None. FINDINGS: The heart size and mediastinal contours are within normal limits. Both lungs are clear. The visualized skeletal structures are unremarkable. IMPRESSION: No active cardiopulmonary disease. Electronically Signed   By: Anner Crete M.D.   On: 12/14/2019 03:30      CT Chest W Contrast  Result Date: 11/30/2019 CLINICAL DATA:  2 month history of difficulty swallowing. Mid chest pain with swallowing.  IMPRESSION: 1. Relatively focal, asymmetric wall thickening distal esophagus raises concern for mass lesion. There is some minimal fluid/debris in the esophageal lumen proximal to the distal wall thickening. 2. Ill-defined tree-in-bud nodularity in all lobes of both lungs some of the nodular components measuring up to 10 mm. Distribution favors atypical infection including MAI although aspiration could have this appearance. 3. Mild mediastinal lymphadenopathy with upper normal lymph nodes in the gastrohepatic ligament. If the distal esophageal wall thickening represents neoplasm, metastatic disease would be a concern. This could also be reactive to the bilateral lung disease. Electronically Signed   By: Misty Stanley M.D.   On: 11/30/2019 13:59   NM PET Image Initial (PI) Skull Base To Thigh  Result Date: 12/09/2019 CLINICAL DATA:  Initial treatment strategy for adenocarcinoma of the gastroesophageal junction. EXAM: NUCLEAR MEDICINE PET SKULL BASE TO THIGH TECHNIQUE: 11.4 mCi F-18 FDG was injected intravenously. Full-ring PET imaging was performed from the skull base to thigh after the radiotracer. CT data was obtained and used for attenuation correction and anatomic localization. Fasting blood glucose: 117 mg/dl COMPARISON:  Chest CT 11/30/2019  IMPRESSION: 1. Known distal esophageal neoplasm is markedly hypermetabolic. 2. Hypermetabolic nodal  metastases in the supraclavicular regions, mediastinum and upper abdomen. No evidence for hypermetabolic metastatic disease in the liver. 3. Scattered hypermetabolic bone metastases. 4. The relatively diffuse tree-in-bud opacity seen in both lungs on the previous chest CT has essentially resolved in the interval. Fine detail on today's CT data is somewhat obscured by non breath hold technique. 5. Left colonic diverticulosis. 6. Prostatomegaly. 7.  Aortic Atherosclerois (ICD10-170.0) Electronically Signed   By: Misty Stanley M.D.   On: 12/09/2019 08:17     Procedures .Critical Care Performed by: Rolland Porter, MD Authorized by: Rolland Porter, MD   Critical care  provider statement:    Critical care time (minutes):  33   Critical care time was exclusive of:  Separately billable procedures and treating other patients   Critical care was necessary to treat or prevent imminent or life-threatening deterioration of the following conditions:  Cardiac failure   Critical care was time spent personally by me on the following activities:  Discussions with consultants, examination of patient, obtaining history from patient or surrogate, ordering and review of laboratory studies, ordering and review of radiographic studies, pulse oximetry, re-evaluation of patient's condition and review of old charts   (including critical care time)  Medications Ordered in ED Medications  heparin ADULT infusion 100 units/mL (25000 units/276m sodium chloride 0.45%) (1,300 Units/hr Intravenous New Bag/Given 12/14/19 0642)  nitroGLYCERIN (NITROGLYN) 2 % ointment 1 inch (1 inch Topical Given 12/14/19 0319)  heparin bolus via infusion 4,000 Units (4,000 Units Intravenous Bolus from Bag 12/14/19 03154    ED Course  I have reviewed the triage vital signs and the nursing notes.  Pertinent labs & imaging results that were available during my care of the patient were reviewed by me and considered in my medical decision making (see chart  for details).    MDM Rules/Calculators/A&P                      Patient already received 324 mg of aspirin by EMS.  Nitroglycerin paste was placed on a skin.  Serial troponins were ordered.  Patient was advised to let nursing staff know if his pain gets worse.  On his monitor he is noted to be in sinus rhythm with a few PACs.  Review of care everywhere shows his last STEMI was in October 2018.  At that time he had a lateral STEMI.  He had a drug-eluting stent placed in the circumflex artery as well as PTCA of a previously stented ramus vessel.  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  He was advised to have 1 year of dual antiplatelet therapy.  When I read the description of his EKG they do not describe the peak T waves that I am seeing septally.  They mainly described the lateral changes of his EKG.  His last EKG that they describe is August 21, 2018.  He has had a cardiology scan September 18 and June 23 however I am unable to read those results.  Patient was last seen on May 30, 2019 by Dr. KFransisca Kaufmann  At that time they stopped his Brilinta.  3:57 AM patient was informed his initial troponin was normal.  We discussed he would be getting a 2-hour troponin which will be more predictive of him having a cardiac event tonight.  Patient's delta troponin is now positive.  He was started on IV heparin.  He has the nitro paste still on his chest.  He was given full dose aspirin by EMS in route to the ED tonight.  5:50 AM I spoke to the patient about his positive delta troponin.  I have also informed him I am waiting to speak to the cardiologist there about transfer.  He is agreeable.  6:05 AM [facility] pending his Covid status.  Patient states he remains chest pain-free.  [facility].   Final Clinical Impression(s) /  ED Diagnoses Final diagnoses:  NSTEMI (non-ST elevated myocardial infarction) [facility])    Rx / DC Orders  Disposition transfer to [facility] for admission by cardiology  Rolland Porter, MD, Barbette Or, MD 12/14/19 [phone]

## 2019-12-14 NOTE — ED Notes (Signed)
Spoke with Marcie Bal at [facility] will set up transport for pt at this time.

## 2019-12-14 NOTE — ED Notes (Signed)
Pt back to room.

## 2019-12-14 NOTE — ED Notes (Signed)
Nitro paste removed due to BP

## 2019-12-14 NOTE — ED Notes (Signed)
covid in process

## 2019-12-14 NOTE — ED Notes (Signed)
edp in room  

## 2019-12-15 MED ORDER — NITROGLYCERIN 0.4 MG SL SUBL
0.40 | SUBLINGUAL_TABLET | SUBLINGUAL | Status: DC
Start: ? — End: 2019-12-15

## 2019-12-15 MED ORDER — EZETIMIBE 10 MG PO TABS
10.00 | ORAL_TABLET | ORAL | Status: DC
Start: 2019-12-17 — End: 2019-12-15

## 2019-12-15 MED ORDER — ACETAMINOPHEN 325 MG PO TABS
650.00 | ORAL_TABLET | ORAL | Status: DC
Start: ? — End: 2019-12-15

## 2019-12-15 MED ORDER — FOLIC ACID 400 MCG PO TABS
800.00 | ORAL_TABLET | ORAL | Status: DC
Start: 2019-12-17 — End: 2019-12-15

## 2019-12-15 MED ORDER — HEPARIN SOD (PORCINE) IN D5W 100 UNIT/ML IV SOLN
30.00 | INTRAVENOUS | Status: DC
Start: ? — End: 2019-12-15

## 2019-12-15 MED ORDER — TAMSULOSIN HCL 0.4 MG PO CAPS
0.40 | ORAL_CAPSULE | ORAL | Status: DC
Start: 2019-12-17 — End: 2019-12-15

## 2019-12-15 MED ORDER — ISOSORBIDE DINITRATE 20 MG PO TABS
20.00 | ORAL_TABLET | ORAL | Status: DC
Start: 2019-12-15 — End: 2019-12-15

## 2019-12-15 MED ORDER — ASPIRIN 81 MG PO TBEC
81.00 | DELAYED_RELEASE_TABLET | ORAL | Status: DC
Start: 2019-12-17 — End: 2019-12-15

## 2019-12-15 MED ORDER — ISOSORBIDE MONONITRATE ER 60 MG PO TB24
60.00 | ORAL_TABLET | ORAL | Status: DC
Start: 2019-12-17 — End: 2019-12-15

## 2019-12-15 MED ORDER — ALUM & MAG HYDROXIDE-SIMETH 200-200-20 MG/5ML PO SUSP
30.00 | ORAL | Status: DC
Start: ? — End: 2019-12-15

## 2019-12-15 MED ORDER — LOSARTAN POTASSIUM 50 MG PO TABS
50.00 | ORAL_TABLET | ORAL | Status: DC
Start: 2019-12-16 — End: 2019-12-15

## 2019-12-15 MED ORDER — METOPROLOL TARTRATE 50 MG PO TABS
50.00 | ORAL_TABLET | ORAL | Status: DC
Start: 2019-12-15 — End: 2019-12-15

## 2019-12-15 MED ORDER — MELATONIN 3 MG PO TABS
6.00 | ORAL_TABLET | ORAL | Status: DC
Start: ? — End: 2019-12-15

## 2019-12-15 MED ORDER — HEPARIN SOD (PORCINE) IN D5W 100 UNIT/ML IV SOLN
5.00 | INTRAVENOUS | Status: DC
Start: ? — End: 2019-12-15

## 2019-12-15 MED ORDER — ATORVASTATIN CALCIUM 40 MG PO TABS
80.00 | ORAL_TABLET | ORAL | Status: DC
Start: 2019-12-16 — End: 2019-12-15

## 2019-12-15 MED ORDER — HEPARIN SOD (PORCINE) IN D5W 100 UNIT/ML IV SOLN
60.00 | INTRAVENOUS | Status: DC
Start: ? — End: 2019-12-15

## 2019-12-16 ENCOUNTER — Ambulatory Visit: Payer: Medicare HMO | Admitting: General Surgery

## 2019-12-16 ENCOUNTER — Encounter (HOSPITAL_COMMUNITY): Payer: Self-pay | Admitting: *Deleted

## 2019-12-16 MED ORDER — LOSARTAN POTASSIUM 50 MG PO TABS
50.00 | ORAL_TABLET | ORAL | Status: DC
Start: ? — End: 2019-12-16

## 2019-12-16 MED ORDER — METOPROLOL TARTRATE 25 MG PO TABS
25.00 | ORAL_TABLET | ORAL | Status: DC
Start: ? — End: 2019-12-16

## 2019-12-16 NOTE — Progress Notes (Signed)
I spoke with patient's wife this morning. He was discharged from [facility] today.  He reports difficulty swallowing liquids and is unable to get anything down.  He reports it taking 30 minutes for a swallow to pass.    I spoke with Dr. Delton Coombes.  Patient is to crush his medications and dissolve in liquid. He is to report to the ER if he is unable to get anything down or begins to feel weak.  He is to keep appointment with Dr.Jenkins on Thursday as scheduled.    Wife is aware and she verbalizes understanding.

## 2019-12-17 ENCOUNTER — Telehealth: Payer: Self-pay

## 2019-12-17 NOTE — Telephone Encounter (Signed)
Nutrition Follow-up:  Wife returned RD's call.  Patient just discharged from [facility] yesterday. Patient not able to tolerate thick, "milky" shakes (ie ensure/boost).  Drinking ensure clear and boost breeze mostly but still has to take small sips and wait awhile before liquids are able to go down.  Wife reports that they are dissolving medications in liquid and patient has been able to get these down.    Wife reports that patient will be meeting with surgeon tomorrow (1/14) to talk about J-tube placement.    Wife reports patient has lost more weight    INTERVENTION:  Wife aware that they are to go to ED if patient unable to keep any liquids down.  Encouraged high calorie, high protein clear liquids to choose.   Agree with placement of J-tube.  Patient is at risk of refeeding syndrome and will need close monitoring of refeeding labs (CMET, Mag and Phosphorus) once feeding started.  Tube feeding will need to be started low and titrated slowly with labs checked frequently.  Message sent to Dr Raliegh Ip.  Discussed briefly with wife about J-tube and continuous tube feeding.   Patient has contact information.     MONITORING, EVALUATION, GOAL: intake, TF tolerance, labs, weight trends   NEXT VISIT: Friday, Jan 15th by phone  Jakoby Melendrez B. Zenia Resides, [place], [place] Registered Dietitian [phone] (pager)

## 2019-12-17 NOTE — Telephone Encounter (Signed)
Nutrition  Contacted by Nurse Navigator to contact patient and/or wife Timothy Oconnor.    Called patient mobile and no answer.  Left message with contact number.  Also called wife's number and no answer.  Left message with contact number.    [name] B. Zenia Resides, [place], [place] Registered Dietitian [phone] (pager)

## 2019-12-18 ENCOUNTER — Other Ambulatory Visit: Payer: Self-pay

## 2019-12-18 ENCOUNTER — Ambulatory Visit: Payer: Medicare HMO | Admitting: General Surgery

## 2019-12-18 ENCOUNTER — Encounter: Payer: Self-pay | Admitting: General Surgery

## 2019-12-18 VITALS — BP 89/53 | HR 61 | Temp 97.6°F | Resp 16 | Ht 70.0 in | Wt 205.0 lb

## 2019-12-18 DIAGNOSIS — C160 Malignant neoplasm of cardia: Secondary | ICD-10-CM

## 2019-12-18 NOTE — Patient Instructions (Signed)
Implanted Port Home Guide An implanted port is a device that is placed under the skin. It is usually placed in the chest. The device can be used to give IV medicine, to take blood, or for dialysis. You may have an implanted port if:  You need IV medicine that would be irritating to the small veins in your hands or arms.  You need IV medicines, such as antibiotics, for a long period of time.  You need IV nutrition for a long period of time.  You need dialysis. Having a port means that your health care provider will not need to use the veins in your arms for these procedures. You may have fewer limitations when using a port than you would if you used other types of long-term IVs, and you will likely be able to return to normal activities after your incision heals. An implanted port has two main parts:  Reservoir. The reservoir is the part where a needle is inserted to give medicines or draw blood. The reservoir is round. After it is placed, it appears as a small, raised area under your skin.  Catheter. The catheter is a thin, flexible tube that connects the reservoir to a vein. Medicine that is inserted into the reservoir goes into the catheter and then into the vein. How is my port accessed? To access your port:  A numbing cream may be placed on the skin over the port site.  Your health care provider will put on a mask and sterile gloves.  The skin over your port will be cleaned carefully with a germ-killing soap and allowed to dry.  Your health care provider will gently pinch the port and insert a needle into it.  Your health care provider will check for a blood return to make sure the port is in the vein and is not clogged.  If your port needs to remain accessed to get medicine continuously (constant infusion), your health care provider will place a clear bandage (dressing) over the needle site. The dressing and needle will need to be changed every week, or as told by your health care  provider. What is flushing? Flushing helps keep the port from getting clogged. Follow instructions from your health care provider about how and when to flush the port. Ports are usually flushed with saline solution or a medicine called heparin. The need for flushing will depend on how the port is used:  If the port is only used from time to time to give medicines or draw blood, the port may need to be flushed: ? Before and after medicines have been given. ? Before and after blood has been drawn. ? As part of routine maintenance. Flushing may be recommended every 4-6 weeks.  If a constant infusion is running, the port may not need to be flushed.  Throw away any syringes in a disposal container that is meant for sharp items (sharps container). You can buy a sharps container from a pharmacy, or you can make one by using an empty hard plastic bottle with a cover. How long will my port stay implanted? The port can stay in for as long as your health care provider thinks it is needed. When it is time for the port to come out, a surgery will be done to remove it. The surgery will be similar to the procedure that was done to put the port in. Follow these instructions at home:   Flush your port as told by your health care provider.    If you need an infusion over several days, follow instructions from your health care provider about how to take care of your port site. Make sure you: ? Wash your hands with soap and water before you change your dressing. If soap and water are not available, use alcohol-based hand sanitizer. ? Change your dressing as told by your health care provider. ? Place any used dressings or infusion bags into a plastic bag. Throw that bag in the trash. ? Keep the dressing that covers the needle clean and dry. Do not get it wet. ? Do not use scissors or sharp objects near the tube. ? Keep the tube clamped, unless it is being used.  Check your port site every day for signs of  infection. Check for: ? Redness, swelling, or pain. ? Fluid or blood. ? Pus or a bad smell.  Protect the skin around the port site. ? Avoid wearing bra straps that rub or irritate the site. ? Protect the skin around your port from seat belts. Place a soft pad over your chest if needed.  Bathe or shower as told by your health care provider. The site may get wet as long as you are not actively receiving an infusion.  Return to your normal activities as told by your health care provider. Ask your health care provider what activities are safe for you.  Carry a medical alert card or wear a medical alert bracelet at all times. This will let health care providers know that you have an implanted port in case of an emergency. Get help right away if:  You have redness, swelling, or pain at the port site.  You have fluid or blood coming from your port site.  You have pus or a bad smell coming from the port site.  You have a fever. Summary  Implanted ports are usually placed in the chest for long-term IV access.  Follow instructions from your health care provider about flushing the port and changing bandages (dressings).  Take care of the area around your port by avoiding clothing that puts pressure on the area, and by watching for signs of infection.  Protect the skin around your port from seat belts. Place a soft pad over your chest if needed.  Get help right away if you have a fever or you have redness, swelling, pain, drainage, or a bad smell at the port site. This information is not intended to replace advice given to you by your health care provider. Make sure you discuss any questions you have with your health care provider. Document Revised: 03/14/2019 Document Reviewed: 12/23/2016 Elsevier Patient Education  [employer]. PEG Tube Home Guide  A percutaneous endoscopic gastrostomy (PEG) tube is used to deliver food and fluids directly into the stomach. The tube has a clamp, a  cap, and two anchors (bolsters). One bolster keeps the tube from coming out of the stomach. The other bolster holds the tube against the abdomen. You will be taught how to use and adjust your PEG tube before you leave the hospital. You will also be taught how to care for the opening (stoma) in your abdomen. Make sure that you understand:  How to care for your PEG tube.  How to care for your stoma.  How to give yourself feedings and medicines.  When to call your health care provider for help. Supplies needed:  Soapy water.  Clean, plain water.  Clean washcloth.  Bandage (dressing). This is optional.  Syringe. How to care  for a PEG tube Check your PEG tube every day. Make sure:  It is not too tight. The bolster should rest gently over the stoma.  It is in the correct position. There is a Derra Shartzer on the tube that shows when it is in the correct position. Adjust the tube if you need to. Cleaning your stoma Clean your stoma every day. Follow these steps: 1. Wash your hands with soap and water. If soap and water are not available, use hand sanitizer. 2. Check the skin around the stoma for redness, rash, swelling, drainage, or extra tissue growth. If you notice any of these, call your health care provider. 3. Wash the stoma and the skin around it using a clean, soft washcloth. Clean using a circular motion, and wipe away from the stoma opening, not toward it. ? Use warm, soapy water, and only use cleansers recommended by your health care provider. ? Rinse the stoma area with plain water. ? Pat the stoma area dry. 4. Place a dressing over the stoma if your health care provider told you to do that.  Giving a feeding Your health care provider will give you instructions about:  How much nutrition and fluid you will need for each feeding.  How often to have a feeding.  Whether to take medicine in the tube by itself or with a feeding. To give yourself a feeding, follow these  steps: 1. Lay out all of the equipment that you will need. 2. Make sure that the nutritional formula is at room temperature. 3. Wash your hands with soap and water. 4. Position yourself so that you are upright. You will need to stay upright throughout the feeding and for at least 30 minutes after the feeding. 5. Make sure the syringe plunger is pushed in. Place the tip of the syringe in clean water, and slowly pull the plunger to bring (draw up) the water into the syringe. 6. Remove the clamp and the cap from the PEG tube. 7. Push the water out of the syringe to clean (flush) the tube. 8. If the tube is clear, draw up the formula into the syringe. Make sure to use the right amount for each feeding and add water if necessary. 9. Slowly push the formula from the syringe through the tube. 10. After the feeding, flush the tube with water. 11. Put the clamp and the cap on the tube. Giving medicine To give yourself medicine, follow these steps: 1. Lay out all of the equipment that you will need. 2. If your medicine is in tablet form, crush the tablet and dissolve it in water. 3. Wash your hands with soap and water. 4. Position yourself so that you are upright. You will need to stay upright while you give yourself medicine and for at least 30 minutes afterward. 5. Make sure the syringe plunger is pushed in. Place the tip of the syringe in clean water, and slowly pull the plunger to bring (draw up) the water into the syringe. 6. Remove the clamp and the cap from the PEG tube. 7. Push the water out of the syringe to clean (flush) the tube. 8. If the tube is clear, draw up the medicine into the syringe. 9. Slowly push the medicine from the syringe through the tube. 10. Flush the tube with water. 11. Put the clamp and the cap on the tube. Do not take sustained release (SR) medicines through your tube. If you are unsure if your medicine is an SR medicine, ask  your health care provider or  pharmacist. Contact a health care provider if you have:  Soreness, redness, or irritation around your stoma.  Abdominal pain or bloating during or after your feedings.  Nausea, constipation, or diarrhea that will not go away.  A fever.  Problems with your PEG tube. Get help right away if:  Your tube is blocked.  Your tube falls out.  You have pain around your stoma.  You are bleeding from your stoma.  Your tube is leaking.  You choke or you have trouble breathing during or after a feeding. Summary  A percutaneous endoscopic gastrostomy (PEG) tube is used to deliver food and fluids directly into the stomach.  You will be taught how to use and adjust your PEG tube. You will also be taught how to care for the stoma in your abdomen.  Your health care provider will give you instructions on how to give yourself nutritional formula and medicines through your PEG tube.  Contact your health care provider if you have a fever or soreness, redness, or irritation around your stoma.  Get help right away if your tube leaks, is blocked, or falls out. Get help right away if you have pain or bleeding around your stoma. This information is not intended to replace advice given to you by your health care provider. Make sure you discuss any questions you have with your health care provider. Document Revised: 02/06/2019 Document Reviewed: 12/03/2017 Elsevier Patient Education  2020 [employer].

## 2019-12-19 ENCOUNTER — Ambulatory Visit (HOSPITAL_COMMUNITY)
Admission: RE | Admit: 2019-12-19 | Discharge: 2019-12-19 | Disposition: A | Discharge status: Home / Self Care | Payer: Medicare HMO | Source: Ambulatory Visit | Attending: Hematology | Admitting: Hematology

## 2019-12-19 ENCOUNTER — Inpatient Hospital Stay (HOSPITAL_COMMUNITY): Payer: Medicare HMO

## 2019-12-19 DIAGNOSIS — C160 Malignant neoplasm of cardia: Secondary | ICD-10-CM

## 2019-12-19 MED ORDER — OSMOLITE 1.5 CAL PO LIQD: ORAL | 6 refills | Status: DC

## 2019-12-19 NOTE — Progress Notes (Addendum)
Nutrition Follow-up:  Patient with new diagnosis of esophageal adenocarcinoma.  Planning port and PEG placement by Dr. Arnoldo Morale on 12/24/19.    Spoke with patient via phone this am.  Patient reports that he was not able to do MRI this am due to anxiety.  Has called the clinic and let them know.  Patient says that he is drinking mostly the clear, juice type shakes as "milky" shakes are too thick.  Yesterday was able to drink 5 ensure clear (180 calories, 9 g protein each) and 1 ensure enlive (350 calories and 20 g protein) and drank beef broth and ate ice cream.    Anthropometrics:   Weight 205 lb on 1/14 (surgeon's office) decreased from 210 lb (12/28)    Estimated Energy Needs  Kcals: [id number] Protein: 112-150 g Fluid: > 2 L  NUTRITION DIAGNOSIS: Inadequate oral intake continues   INTERVENTION:  Patient to continue with liquid shakes at this time.   Encouraged patient to try adding protein powder to beef broth, jello for more protein Clarified with Dr. Arnoldo Morale planning PEG not J-tube at this time. Once tube placed and ok for using recommend starting osmolite 1.5, ultimate goal 1 1/2 cartons QID (8am, 12, 4pm and 8pm).  Flush with 74ml of water before and after.  Patient to either drink or give via tube additional 225ml water TID for hydration.  Will provide 2130 calories, 89 g protein and 2243ml free water.  Recommend starting patient at 1/2 carton of tube feeding QID and adding 1/2 carton of formula daily until able to reach goal.     Patient at risk of refeeding syndrome and will need refeeding labs checked and supplemented during tube feeding titration.  Spoke with Blake Divine with [facility] to coordinate enteral supplies.      MONITORING, EVALUATION, GOAL: Patient will consume adequate calories and protein to preserve lean muscle mass   NEXT VISIT:  Phone f/u on Thursday, Jan 21   Caley Volkert B. Zenia Resides, [place], [place] Registered Dietitian [phone] (pager)

## 2019-12-19 NOTE — Progress Notes (Signed)
Timothy Oconnor; [id number]; 26-Nov-1951   HPI Patient is a 69 year old white male recently diagnosed with gastroesophageal adenocarcinoma who now presents for both a Port-A-Cath and a PEG.  This was recently diagnosed.  He is about to undergo chemotherapy.  He is only able to drink liquids at this present time.  An EGD done by Dr. Laural Golden diagnosed the malignancy with narrowing, but he was able to traverse the cancer.  Patient was recently at [facility] for acute coronary syndrome.  He did not have myocardial infarction.  He has had multiple stents placed for coronary artery disease in the past.  He currently denies any chest pain.  He has 0 out of 10 pain. Past Medical History:  Diagnosis Date  . 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[name])   . Diabetes ([place])   . Heart attack ([place])   . Heart disease   . High cholesterol   . Hyperplasia of prostate   . Hypertension   . Low back pain   . Tremor     Past Surgical History:  Procedure Laterality Date  . arterectomy     1993  . BIOPSY  12/01/2019   Procedure: BIOPSY;  Surgeon: Rogene Houston, MD;  [facility];  Service: Endoscopy;;  esophagus  . ESOPHAGOGASTRODUODENOSCOPY (EGD) WITH PROPOFOL N/A 12/01/2019   Procedure: ESOPHAGOGASTRODUODENOSCOPY (EGD) WITH PROPOFOL;  Surgeon: Rogene Houston, MD;  Location: [facility];  Service: Endoscopy;  Laterality: N/A;  . Heart stents     2002, 2004, 2006    Family History  Problem Relation Age of Onset  . Alzheimer's disease Mother   . Diabetes Father   . Heart disease Father   . Stroke Father   . Heart disease Brother   . Colon cancer Brother   . Alzheimer's disease Sister     Current Outpatient Medications on File Prior to Visit  Medication Sig Dispense Refill  . ascorbic acid (VITAMIN C) 500 MG tablet Take 500 mg by mouth daily.    Marland Kitchen aspirin EC 81 MG tablet Take 81 mg by mouth daily.     Marland Kitchen atorvastatin (LIPITOR) 80 MG tablet Take 80 mg by mouth every  evening.     . ezetimibe (ZETIA) 10 MG tablet Take 10 mg by mouth daily.     . folic acid (FOLVITE) Q000111Q MCG tablet Take 800 mcg by mouth daily.     . Glucosamine HCl (GLUCOSAMINE PO) Take 120 mg by mouth 2 (two) times daily.    . isosorbide mononitrate (IMDUR) 30 MG 24 hr tablet Take 60 mg by mouth daily.     Marland Kitchen losartan (COZAAR) 50 MG tablet Take 50 mg by mouth daily.     . metFORMIN (GLUCOPHAGE-XR) 500 MG 24 hr tablet Take 500 mg by mouth daily with breakfast.    . metoprolol tartrate (LOPRESSOR) 25 MG tablet Take 0.5 tablets (12.5 mg total) by mouth 2 (two) times daily. (Patient taking differently: Take 25 mg by mouth 2 (two) times daily. ) 60 tablet 1  . Multiple Vitamin (MULTI-VITAMINS) TABS Take 1 tablet by mouth daily.     . Omega-3 Fatty Acids (FISH OIL) 1000 MG CAPS Take 1 capsule by mouth 2 (two) times daily.     . Saw Palmetto 450 MG CAPS Take 1 capsule by mouth daily.     . tamsulosin (FLOMAX) 0.4 MG CAPS capsule Take 0.4 mg by mouth daily.      No current facility-administered medications on file prior to visit.  No Known Allergies  Social History   Substance and Sexual Activity  Alcohol Use Yes  . Alcohol/week: 0.0 standard drinks   Comment: 3 drinks per week    Social History   Tobacco Use  Smoking Status Former Smoker  . Packs/day: 1.00  . Years: 50.00  . Pack years: 50.00  Smokeless Tobacco Never Used  Tobacco Comment   Quit 2017    Review of Systems  Constitutional: Negative.   HENT: Negative.   Eyes: Negative.   Respiratory: Negative.   Cardiovascular: Negative.   Gastrointestinal: Negative.   Genitourinary: Negative.   Musculoskeletal: Negative.   Skin: Negative.   Neurological: Negative.   Endo/Heme/Allergies: Negative.   Psychiatric/Behavioral: Negative.     Objective   Vitals:   12/18/19 1121 12/18/19 1128  BP: (!) 80/49 (!) 89/53  Pulse: 61   Resp: 16   Temp: 97.6 F (36.4 C)   SpO2: 94%     Physical Exam Vitals reviewed.   Constitutional:      Appearance: Normal appearance. He is not ill-appearing.  HENT:     Head: Normocephalic and atraumatic.  Cardiovascular:     Rate and Rhythm: Normal rate and regular rhythm.     Heart sounds: Normal heart sounds. No murmur. No friction rub. No gallop.   Pulmonary:     Effort: Pulmonary effort is normal. No respiratory distress.     Breath sounds: Normal breath sounds. No stridor. No wheezing, rhonchi or rales.  Abdominal:     General: Abdomen is flat. Bowel sounds are normal. There is no distension.     Palpations: Abdomen is soft.     Tenderness: There is no abdominal tenderness. There is no guarding or rebound.     Hernia: No hernia is present.  Skin:    General: Skin is warm and dry.  Neurological:     Mental Status: He is alert and oriented to person, place, and time.    Previous hospitalization notes reviewed Assessment  Adenocarcinoma of the gastroesophageal juncture Plan   Patient is scheduled for Port-A-Cath placement and PEG on 12/24/2019.  The risks and benefits of procedure including bleeding, infection, bowel injury, and pneumothorax were fully explained to the patient, who gave informed consent.

## 2019-12-19 NOTE — H&P (Signed)
Timothy Oconnor; [id number]; Mar 01, 1951   HPI Patient is a 69 year old white male recently diagnosed with gastroesophageal adenocarcinoma who now presents for both a Port-A-Cath and a PEG.  This was recently diagnosed.  He is about to undergo chemotherapy.  He is only able to drink liquids at this present time.  An EGD done by Dr. Laural Golden diagnosed the malignancy with narrowing, but he was able to traverse the cancer.  Patient was recently at [facility] for acute coronary syndrome.  He did not have myocardial infarction.  He has had multiple stents placed for coronary artery disease in the past.  He currently denies any chest pain.  He has 0 out of 10 pain. Past Medical History:  Diagnosis Date  . 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[place])   . Diabetes ([place])   . Heart attack ([place])   . Heart disease   . High cholesterol   . Hyperplasia of prostate   . Hypertension   . Low back pain   . Tremor     Past Surgical History:  Procedure Laterality Date  . arterectomy     1993  . BIOPSY  12/01/2019   Procedure: BIOPSY;  Surgeon: Rogene Houston, MD;  [facility];  Service: Endoscopy;;  esophagus  . ESOPHAGOGASTRODUODENOSCOPY (EGD) WITH PROPOFOL N/A 12/01/2019   Procedure: ESOPHAGOGASTRODUODENOSCOPY (EGD) WITH PROPOFOL;  Surgeon: Rogene Houston, MD;  [facility];  Service: Endoscopy;  Laterality: N/A;  . Heart stents     2002, 2004, 2006    Family History  Problem Relation Age of Onset  . Alzheimer's disease Mother   . Diabetes Father   . Heart disease Father   . Stroke Father   . Heart disease Brother   . Colon cancer Brother   . Alzheimer's disease Sister     Current Outpatient Medications on File Prior to Visit  Medication Sig Dispense Refill  . ascorbic acid (VITAMIN C) 500 MG tablet Take 500 mg by mouth daily.    Marland Kitchen aspirin EC 81 MG tablet Take 81 mg by mouth daily.     Marland Kitchen atorvastatin (LIPITOR) 80 MG tablet Take 80 mg by mouth every  evening.     . ezetimibe (ZETIA) 10 MG tablet Take 10 mg by mouth daily.     . folic acid (FOLVITE) Q000111Q MCG tablet Take 800 mcg by mouth daily.     . Glucosamine HCl (GLUCOSAMINE PO) Take 120 mg by mouth 2 (two) times daily.    . isosorbide mononitrate (IMDUR) 30 MG 24 hr tablet Take 60 mg by mouth daily.     Marland Kitchen losartan (COZAAR) 50 MG tablet Take 50 mg by mouth daily.     . metFORMIN (GLUCOPHAGE-XR) 500 MG 24 hr tablet Take 500 mg by mouth daily with breakfast.    . metoprolol tartrate (LOPRESSOR) 25 MG tablet Take 0.5 tablets (12.5 mg total) by mouth 2 (two) times daily. (Patient taking differently: Take 25 mg by mouth 2 (two) times daily. ) 60 tablet 1  . Multiple Vitamin (MULTI-VITAMINS) TABS Take 1 tablet by mouth daily.     . Omega-3 Fatty Acids (FISH OIL) 1000 MG CAPS Take 1 capsule by mouth 2 (two) times daily.     . Saw Palmetto 450 MG CAPS Take 1 capsule by mouth daily.     . tamsulosin (FLOMAX) 0.4 MG CAPS capsule Take 0.4 mg by mouth daily.      No current facility-administered medications on file prior to visit.  No Known Allergies  Social History   Substance and Sexual Activity  Alcohol Use Yes  . Alcohol/week: 0.0 standard drinks   Comment: 3 drinks per week    Social History   Tobacco Use  Smoking Status Former Smoker  . Packs/day: 1.00  . Years: 50.00  . Pack years: 50.00  Smokeless Tobacco Never Used  Tobacco Comment   Quit 2017    Review of Systems  Constitutional: Negative.   HENT: Negative.   Eyes: Negative.   Respiratory: Negative.   Cardiovascular: Negative.   Gastrointestinal: Negative.   Genitourinary: Negative.   Musculoskeletal: Negative.   Skin: Negative.   Neurological: Negative.   Endo/Heme/Allergies: Negative.   Psychiatric/Behavioral: Negative.     Objective   Vitals:   12/18/19 1121 12/18/19 1128  BP: (!) 80/49 (!) 89/53  Pulse: 61   Resp: 16   Temp: 97.6 F (36.4 C)   SpO2: 94%     Physical Exam Vitals reviewed.   Constitutional:      Appearance: Normal appearance. He is not ill-appearing.  HENT:     Head: Normocephalic and atraumatic.  Cardiovascular:     Rate and Rhythm: Normal rate and regular rhythm.     Heart sounds: Normal heart sounds. No murmur. No friction rub. No gallop.   Pulmonary:     Effort: Pulmonary effort is normal. No respiratory distress.     Breath sounds: Normal breath sounds. No stridor. No wheezing, rhonchi or rales.  Abdominal:     General: Abdomen is flat. Bowel sounds are normal. There is no distension.     Palpations: Abdomen is soft.     Tenderness: There is no abdominal tenderness. There is no guarding or rebound.     Hernia: No hernia is present.  Skin:    General: Skin is warm and dry.  Neurological:     Mental Status: He is alert and oriented to person, place, and time.    Previous hospitalization notes reviewed Assessment  Adenocarcinoma of the gastroesophageal juncture Plan   Patient is scheduled for Port-A-Cath placement and PEG on 12/24/2019.  The risks and benefits of procedure including bleeding, infection, bowel injury, and pneumothorax were fully explained to the patient, who gave informed consent.

## 2019-12-19 NOTE — Addendum Note (Signed)
Addended by: Jennet Maduro B on: 12/19/2019 02:24 PM   Modules accepted: Orders

## 2019-12-22 ENCOUNTER — Other Ambulatory Visit (HOSPITAL_COMMUNITY)
Admission: RE | Admit: 2019-12-22 | Discharge: 2019-12-22 | Disposition: A | Discharge status: Home / Self Care | Payer: Medicare HMO | Source: Ambulatory Visit | Attending: General Surgery | Admitting: General Surgery

## 2019-12-22 ENCOUNTER — Ambulatory Visit (HOSPITAL_COMMUNITY): Payer: Medicare HMO | Admitting: Hematology

## 2019-12-22 ENCOUNTER — Encounter (HOSPITAL_COMMUNITY)
Admission: RE | Admit: 2019-12-22 | Discharge: 2019-12-22 | Disposition: A | Discharge status: Home / Self Care | Payer: Medicare HMO | Source: Ambulatory Visit | Attending: General Surgery | Admitting: General Surgery

## 2019-12-22 ENCOUNTER — Other Ambulatory Visit: Payer: Self-pay

## 2019-12-22 ENCOUNTER — Encounter (HOSPITAL_COMMUNITY): Payer: Self-pay | Admitting: Hematology

## 2019-12-22 ENCOUNTER — Other Ambulatory Visit (HOSPITAL_COMMUNITY): Payer: Self-pay | Admitting: *Deleted

## 2019-12-22 ENCOUNTER — Encounter (INDEPENDENT_AMBULATORY_CARE_PROVIDER_SITE_OTHER): Payer: Self-pay

## 2019-12-22 ENCOUNTER — Encounter (HOSPITAL_COMMUNITY): Payer: Self-pay

## 2019-12-22 ENCOUNTER — Other Ambulatory Visit (HOSPITAL_COMMUNITY): Payer: Self-pay | Admitting: Nurse Practitioner

## 2019-12-22 DIAGNOSIS — Z01812 Encounter for preprocedural laboratory examination: Secondary | ICD-10-CM | POA: Insufficient documentation

## 2019-12-22 DIAGNOSIS — Z20822 Contact with and (suspected) exposure to covid-19: Secondary | ICD-10-CM | POA: Diagnosis not present

## 2019-12-22 DIAGNOSIS — C160 Malignant neoplasm of cardia: Secondary | ICD-10-CM

## 2019-12-22 DIAGNOSIS — F418 Other specified anxiety disorders: Secondary | ICD-10-CM

## 2019-12-22 HISTORY — DX: Anxiety disorder, unspecified: F41.9

## 2019-12-22 LAB — SARS CORONAVIRUS 2 (TAT 6-24 HRS): SARS Coronavirus 2: NEGATIVE

## 2019-12-22 MED ORDER — DIAZEPAM 5 MG PO TABS: 5.0000 mg | ORAL_TABLET | ORAL | 0 refills | Status: DC

## 2019-12-22 NOTE — Progress Notes (Signed)
Orders placed for home health nursing to educate on j-tube care and feedings.

## 2019-12-24 ENCOUNTER — Ambulatory Visit (HOSPITAL_COMMUNITY)
Admission: RE | Admit: 2019-12-24 | Discharge: 2019-12-24 | Disposition: A | Discharge status: Home / Self Care | Payer: Medicare HMO | Attending: General Surgery | Admitting: General Surgery

## 2019-12-24 ENCOUNTER — Encounter (HOSPITAL_COMMUNITY): Payer: Self-pay | Admitting: General Surgery

## 2019-12-24 ENCOUNTER — Ambulatory Visit (HOSPITAL_COMMUNITY): Payer: Medicare HMO

## 2019-12-24 ENCOUNTER — Ambulatory Visit (HOSPITAL_COMMUNITY): Payer: Medicare HMO | Admitting: Anesthesiology

## 2019-12-24 ENCOUNTER — Other Ambulatory Visit: Payer: Self-pay

## 2019-12-24 ENCOUNTER — Encounter (HOSPITAL_COMMUNITY)
Admission: RE | Disposition: A | Discharge status: Home / Self Care | Payer: Self-pay | Source: Home / Self Care | Attending: General Surgery

## 2019-12-24 DIAGNOSIS — Z7982 Long term (current) use of aspirin: Secondary | ICD-10-CM | POA: Diagnosis not present

## 2019-12-24 DIAGNOSIS — Z7984 Long term (current) use of oral hypoglycemic drugs: Secondary | ICD-10-CM | POA: Diagnosis not present

## 2019-12-24 DIAGNOSIS — Z79899 Other long term (current) drug therapy: Secondary | ICD-10-CM | POA: Insufficient documentation

## 2019-12-24 DIAGNOSIS — Z87891 Personal history of nicotine dependence: Secondary | ICD-10-CM | POA: Insufficient documentation

## 2019-12-24 DIAGNOSIS — I10 Essential (primary) hypertension: Secondary | ICD-10-CM | POA: Diagnosis not present

## 2019-12-24 DIAGNOSIS — C160 Malignant neoplasm of cardia: Secondary | ICD-10-CM

## 2019-12-24 DIAGNOSIS — E7800 Pure hypercholesterolemia, unspecified: Secondary | ICD-10-CM | POA: Diagnosis not present

## 2019-12-24 DIAGNOSIS — Z95828 Presence of other vascular implants and grafts: Secondary | ICD-10-CM

## 2019-12-24 DIAGNOSIS — E119 Type 2 diabetes mellitus without complications: Secondary | ICD-10-CM | POA: Insufficient documentation

## 2019-12-24 DIAGNOSIS — Z800 Family history of malignant neoplasm of digestive organs: Secondary | ICD-10-CM | POA: Diagnosis not present

## 2019-12-24 DIAGNOSIS — R1310 Dysphagia, unspecified: Secondary | ICD-10-CM | POA: Insufficient documentation

## 2019-12-24 DIAGNOSIS — K219 Gastro-esophageal reflux disease without esophagitis: Secondary | ICD-10-CM | POA: Diagnosis not present

## 2019-12-24 DIAGNOSIS — Z452 Encounter for adjustment and management of vascular access device: Secondary | ICD-10-CM | POA: Diagnosis not present

## 2019-12-24 DIAGNOSIS — Z955 Presence of coronary angioplasty implant and graft: Secondary | ICD-10-CM | POA: Insufficient documentation

## 2019-12-24 DIAGNOSIS — I252 Old myocardial infarction: Secondary | ICD-10-CM | POA: Diagnosis not present

## 2019-12-24 DIAGNOSIS — I2510 Atherosclerotic heart disease of native coronary artery without angina pectoris: Secondary | ICD-10-CM | POA: Diagnosis not present

## 2019-12-24 DIAGNOSIS — Z4589 Encounter for adjustment and management of other implanted devices: Secondary | ICD-10-CM | POA: Insufficient documentation

## 2019-12-24 HISTORY — PX: PEG PLACEMENT: SHX5437

## 2019-12-24 HISTORY — PX: PORTACATH PLACEMENT: SHX2246

## 2019-12-24 HISTORY — PX: ESOPHAGOGASTRODUODENOSCOPY (EGD) WITH PROPOFOL: SHX5813

## 2019-12-24 LAB — GLUCOSE, CAPILLARY: Glucose-Capillary: 121 mg/dL — ABNORMAL HIGH (ref 70–99)

## 2019-12-24 SURGERY — ESOPHAGOGASTRODUODENOSCOPY (EGD) WITH PROPOFOL
Anesthesia: General | Site: Esophagus

## 2019-12-24 MED ORDER — HYDROCODONE-ACETAMINOPHEN 7.5-325 MG/15ML PO SOLN: 15.0000 mL | ORAL | 0 refills | Status: DC | PRN

## 2019-12-24 MED ORDER — KETAMINE HCL 50 MG/5ML IJ SOSY
PREFILLED_SYRINGE | INTRAMUSCULAR | Status: AC
  Filled 2019-12-24: qty 5

## 2019-12-24 MED ORDER — MIDAZOLAM HCL 5 MG/5ML IJ SOLN
INTRAMUSCULAR | Status: DC | PRN
  Administered 2019-12-24: 2 mg via INTRAVENOUS

## 2019-12-24 MED ORDER — GLYCOPYRROLATE 0.2 MG/ML IJ SOLN
INTRAMUSCULAR | Status: DC | PRN
  Administered 2019-12-24: 17.88 ug via INTRAVENOUS

## 2019-12-24 MED ORDER — CHLORHEXIDINE GLUCONATE CLOTH 2 % EX PADS: 6.0000 | MEDICATED_PAD | Freq: Once | CUTANEOUS | Status: DC

## 2019-12-24 MED ORDER — LACTATED RINGERS IV SOLN: INTRAVENOUS | Status: DC | PRN

## 2019-12-24 MED ORDER — ONDANSETRON HCL 4 MG/2ML IJ SOLN: 4.0000 mg | Freq: Once | INTRAMUSCULAR | Status: DC | PRN

## 2019-12-24 MED ORDER — HEPARIN SOD (PORK) LOCK FLUSH 100 UNIT/ML IV SOLN
INTRAVENOUS | Status: AC
  Filled 2019-12-24: qty 5

## 2019-12-24 MED ORDER — ONDANSETRON HCL 4 MG/2ML IJ SOLN
INTRAMUSCULAR | Status: DC | PRN
  Administered 2019-12-24: 4 mg via INTRAVENOUS

## 2019-12-24 MED ORDER — MIDAZOLAM HCL 2 MG/2ML IJ SOLN
INTRAMUSCULAR | Status: AC
  Filled 2019-12-24: qty 2

## 2019-12-24 MED ORDER — FENTANYL CITRATE (PF) 100 MCG/2ML IJ SOLN: 25.0000 ug | INTRAMUSCULAR | Status: DC | PRN

## 2019-12-24 MED ORDER — KETOROLAC TROMETHAMINE 30 MG/ML IJ SOLN
15.0000 mg | Freq: Once | INTRAMUSCULAR | Status: AC
  Administered 2019-12-24: 15 mg via INTRAVENOUS

## 2019-12-24 MED ORDER — HEPARIN SOD (PORK) LOCK FLUSH 100 UNIT/ML IV SOLN
INTRAVENOUS | Status: DC | PRN
  Administered 2019-12-24: 500 [IU] via INTRAVENOUS

## 2019-12-24 MED ORDER — KETAMINE HCL 10 MG/ML IJ SOLN
INTRAMUSCULAR | Status: DC | PRN
  Administered 2019-12-24 (×2): 10 mg via INTRAVENOUS

## 2019-12-24 MED ORDER — KETOROLAC TROMETHAMINE 30 MG/ML IJ SOLN
INTRAMUSCULAR | Status: AC
  Filled 2019-12-24: qty 1

## 2019-12-24 MED ORDER — PROPOFOL 500 MG/50ML IV EMUL
INTRAVENOUS | Status: DC | PRN
  Administered 2019-12-24: 100 ug/kg/min via INTRAVENOUS

## 2019-12-24 MED ORDER — KETOROLAC TROMETHAMINE 30 MG/ML IJ SOLN: 30.0000 mg | Freq: Once | INTRAMUSCULAR | Status: DC

## 2019-12-24 MED ORDER — LIDOCAINE HCL (PF) 1 % IJ SOLN
INTRAMUSCULAR | Status: DC | PRN
  Administered 2019-12-24: 2 mL
  Administered 2019-12-24: 9 mL

## 2019-12-24 MED ORDER — FENTANYL CITRATE (PF) 100 MCG/2ML IJ SOLN
INTRAMUSCULAR | Status: DC | PRN
  Administered 2019-12-24: 25 ug via INTRAVENOUS

## 2019-12-24 MED ORDER — LIDOCAINE HCL (PF) 1 % IJ SOLN
INTRAMUSCULAR | Status: AC
  Filled 2019-12-24: qty 30

## 2019-12-24 MED ORDER — LACTATED RINGERS IV SOLN
Freq: Once | INTRAVENOUS | Status: AC
  Administered 2019-12-24: 09:00:00 1000 mL via INTRAVENOUS

## 2019-12-24 MED ORDER — GLYCOPYRROLATE PF 0.2 MG/ML IJ SOSY
PREFILLED_SYRINGE | INTRAMUSCULAR | Status: AC
  Filled 2019-12-24: qty 1

## 2019-12-24 MED ORDER — ONDANSETRON HCL 4 MG/2ML IJ SOLN
INTRAMUSCULAR | Status: AC
  Filled 2019-12-24: qty 2

## 2019-12-24 MED ORDER — SODIUM CHLORIDE (PF) 0.9 % IJ SOLN
INTRAMUSCULAR | Status: DC | PRN
  Administered 2019-12-24: 10 mL via INTRAVENOUS

## 2019-12-24 MED ORDER — WATER FOR IRRIGATION, STERILE IR SOLN
Status: DC | PRN
  Administered 2019-12-24: 1000 mL via SURGICAL_CAVITY

## 2019-12-24 MED ORDER — FENTANYL CITRATE (PF) 100 MCG/2ML IJ SOLN
INTRAMUSCULAR | Status: AC
  Filled 2019-12-24: qty 2

## 2019-12-24 MED ORDER — PROPOFOL 10 MG/ML IV BOLUS
INTRAVENOUS | Status: DC | PRN
  Administered 2019-12-24 (×3): 20 mg via INTRAVENOUS

## 2019-12-24 MED ORDER — CEFAZOLIN SODIUM-DEXTROSE 2-4 GM/100ML-% IV SOLN
2.0000 g | INTRAVENOUS | Status: AC
  Administered 2019-12-24: 2 g via INTRAVENOUS
  Filled 2019-12-24: qty 100

## 2019-12-24 SURGICAL SUPPLY — 34 items
APPLICATOR CHLORAPREP 10.5 ORG (MISCELLANEOUS) ×4 IMPLANT
BAG DECANTER FOR FLEXI CONT (MISCELLANEOUS) ×4 IMPLANT
CLOTH BEACON ORANGE TIMEOUT ST (SAFETY) ×4 IMPLANT
COVER LIGHT HANDLE STERIS (MISCELLANEOUS) ×8 IMPLANT
COVER WAND RF STERILE (DRAPES) ×4 IMPLANT
DECANTER SPIKE VIAL GLASS SM (MISCELLANEOUS) ×4 IMPLANT
DERMABOND ADVANCED (GAUZE/BANDAGES/DRESSINGS) ×1
DERMABOND ADVANCED .7 DNX12 (GAUZE/BANDAGES/DRESSINGS) ×3 IMPLANT
DRAPE C-ARM FOLDED MOBILE STRL (DRAPES) ×4 IMPLANT
ELECT REM PT RETURN 9FT ADLT (ELECTROSURGICAL) ×4
ELECTRODE REM PT RTRN 9FT ADLT (ELECTROSURGICAL) ×3 IMPLANT
GLOVE BIOGEL PI IND STRL 6.5 (GLOVE) ×3 IMPLANT
GLOVE BIOGEL PI IND STRL 7.0 (GLOVE) ×6 IMPLANT
GLOVE BIOGEL PI INDICATOR 6.5 (GLOVE) ×1
GLOVE BIOGEL PI INDICATOR 7.0 (GLOVE) ×2
GLOVE SURG SS PI 7.5 STRL IVOR (GLOVE) ×4 IMPLANT
GOWN STRL REUS W/TWL LRG LVL3 (GOWN DISPOSABLE) ×8 IMPLANT
IV NS 500ML (IV SOLUTION) ×1
IV NS 500ML BAXH (IV SOLUTION) ×3 IMPLANT
KIT PEG SAFETY 20FR (KITS) ×4 IMPLANT
KIT PORT POWER 8FR ISP MRI (Port) ×4 IMPLANT
KIT TURNOVER KIT A (KITS) ×4 IMPLANT
MANIFOLD NEPTUNE II (INSTRUMENTS) ×4 IMPLANT
NDL HYPO 25X1 1.5 SAFETY (NEEDLE) ×3 IMPLANT
NEEDLE HYPO 25X1 1.5 SAFETY (NEEDLE) ×4 IMPLANT
PACK MINOR (CUSTOM PROCEDURE TRAY) ×4 IMPLANT
PAD ARMBOARD 7.5X6 YLW CONV (MISCELLANEOUS) ×4 IMPLANT
SET BASIN LINEN APH (SET/KITS/TRAYS/PACK) ×4 IMPLANT
SPONGE DRAIN TRACH 4X4 STRL 2S (GAUZE/BANDAGES/DRESSINGS) ×1 IMPLANT
SUT MNCRL AB 4-0 PS2 18 (SUTURE) ×4 IMPLANT
SUT VIC AB 3-0 SH 27 (SUTURE) ×1
SUT VIC AB 3-0 SH 27X BRD (SUTURE) ×3 IMPLANT
SYR 5ML LL (SYRINGE) ×4 IMPLANT
SYR CONTROL 10ML LL (SYRINGE) ×4 IMPLANT

## 2019-12-24 NOTE — Anesthesia Preprocedure Evaluation (Signed)
Anesthesia Evaluation  Patient identified by MRN, date of birth, ID band Patient awake    Reviewed: Allergy & Precautions, NPO status , Patient's Chart, lab work & pertinent test results, reviewed documented beta blocker date and time   History of Anesthesia Complications Negative for: history of anesthetic complications  Airway Mallampati: II  TM Distance: >3 FB Neck ROM: Full    Dental  (+) Missing, Caps, Dental Advisory Given   Pulmonary former smoker,    Pulmonary exam normal breath sounds clear to auscultation       Cardiovascular Exercise Tolerance: Good hypertension, Pt. on medications and Pt. on home beta blockers + Past MI and + Cardiac Stents (6 or 7 stents, last 2 stents in 2018)  (-) DVT Normal cardiovascular exam Rhythm:Regular Rate:Normal     Neuro/Psych Anxiety negative neurological ROS  negative psych ROS   GI/Hepatic Neg liver ROS, GERD (Dysphagia)  Medicated,Esophageal cancer   Endo/Other  diabetes, Well Controlled, Type 2  Renal/GU negative Renal ROS   BPH negative genitourinary   Musculoskeletal negative musculoskeletal ROS (+) Back pain   Abdominal   Peds negative pediatric ROS (+)  Hematology negative hematology ROS (+)   Anesthesia Other Findings Cardiology assessment : 05/2019 The patient has CAD and is s/p remote PCI DES of RCA and recent STEMI treated with PPCI DES of CX Oct 2018. There is borderline disease in the LAD. The patient is in CC-1 for angina. He has made good recovery from his MI. No clinical evidence of CHF or arrhythmia. In view of easy bruising it is enough time since his last PCI DES in 2018 that we can stop the Brilinta. Other than that, we should stay the course with the current medical regimen. No indication for an invasive cardiovascular strategy at this time.  PLAN: 1. Continue current medications: Current Outpatient Medications  Medication Sig Dispense Refill  .  ascorbic acid, vitamin C, (VITAMIN C) 500 MG tablet Take 500 mg by mouth daily.  Marland Kitchen aspirin 81 MG EC tablet *ANTIPLATELET* Take 81 mg by mouth daily.  Marland Kitchen atorvastatin (LIPITOR) 80 MG tablet Take 80 mg by mouth nightly.  . folic acid (FOLVITE) [id number] MCG tablet Take 800 mcg by mouth daily.  Marland Kitchen glucosam-chondroitin-diet cb25 116-100 mg Cap Take 1 capsule by mouth 2 times daily.  . isosorbide mononitrate ER (IMDUR ER) 30 MG 24 hr tablet Take 1 tablet (30 mg total) by mouth daily. 30 tablet 2  . losartan (COZAAR) 50 MG tablet Take 25 mg by mouth daily.  . metFORMIN ER (GLUCOPHAGE-XR) 500 MG extended release tablet Take 500 mg by mouth daily.  . metoPROLOL succinate (TOPROL-XL) 25 MG 24 hr tablet Take 25 mg by mouth daily.  . multivitamin (THERAGRAN) per tablet Take 1 tablet by mouth daily.  . nitroglycerin (NITROSTAT) 0.4 MG SL tablet Place 1 tablet (0.4 mg total) under the tongue every 5 (five) minutes as needed for Chest pain. Call doctor/911 if take 2 doses. Max 3/day. 25 tablet 5  . omega-3 fatty acids-vitamin E 1,000 mg Cap Take 1 capsule by mouth 2 times daily.  . saw palmetto fruit 450 mg Cap Take 3 capsules by mouth 2 times daily.  . tamsulosin (FLOMAX) 0.4 mg Cp24 Take 0.4 mg by mouth daily.  Marland Kitchen ZETIA 10 mg tablet Take 10 mg by mouth daily.   No current facility-administered medications for this visit.   2. STOP ticagrelor (Brilinta) 3. Stay on aspirin 81 mg a day without interruption.  4. Follow  a low fat, low cholesterol diet.  5. Try to get regular exercise at least 30 minutes (no more, no less) at least every other day.  6. Consistency of exercise is more important than duration or intensity.  7. Call 911 and go to the nearest emergency room for any episode of severe chest pain lasting longer than 15 minutes and/or unresponsive to 3 nitroglycerin tablets. 8. Give Dr. Clovia Cuff a phone call [phone]) if you develop a increased exertional chest pain or shortness of breath.  9. Regular  follow-up visits with your primary care physician. 10. Cardiology follow-up visit in one year.     Reproductive/Obstetrics negative OB ROS                            Anesthesia Physical Anesthesia Plan  ASA: III  Anesthesia Plan: General   Post-op Pain Management:    Induction: Intravenous  PONV Risk Score and Plan: Ondansetron, Midazolam and Dexamethasone  Airway Management Planned: Nasal Cannula, Natural Airway and Simple Face Mask  Additional Equipment:   Intra-op Plan:   Post-operative Plan:   Informed Consent: I have reviewed the patients History and Physical, chart, labs and discussed the procedure including the risks, benefits and alternatives for the proposed anesthesia with the patient or authorized representative who has indicated his/her understanding and acceptance.     Dental advisory given  Plan Discussed with: CRNA and Surgeon  Anesthesia Plan Comments: (Possible GA with ETT was discussed)       Anesthesia Quick Evaluation

## 2019-12-24 NOTE — Discharge Instructions (Signed)
PEG Tube Home Guide  A percutaneous endoscopic gastrostomy (PEG) tube is used to deliver food and fluids directly into the stomach. The tube has a clamp, a cap, and two anchors (bolsters). One bolster keeps the tube from coming out of the stomach. The other bolster holds the tube against the abdomen. You will be taught how to use and adjust your PEG tube before you leave the hospital. You will also be taught how to care for the opening (stoma) in your abdomen. Make sure that you understand:  How to care for your PEG tube.  How to care for your stoma.  How to give yourself feedings and medicines.  When to call your health care provider for help. Supplies needed:  Soapy water.  Clean, plain water.  Clean washcloth.  Bandage (dressing). This is optional.  Syringe. How to care for a PEG tube Check your PEG tube every day. Make sure:  It is not too tight. The bolster should rest gently over the stoma.  It is in the correct position. There is a mark on the tube that shows when it is in the correct position. Adjust the tube if you need to. Cleaning your stoma Clean your stoma every day. Follow these steps: 1. Wash your hands with soap and water. If soap and water are not available, use hand sanitizer. 2. Check the skin around the stoma for redness, rash, swelling, drainage, or extra tissue growth. If you notice any of these, call your health care provider. 3. Wash the stoma and the skin around it using a clean, soft washcloth. Clean using a circular motion, and wipe away from the stoma opening, not toward it. ? Use warm, soapy water, and only use cleansers recommended by your health care provider. ? Rinse the stoma area with plain water. ? Pat the stoma area dry. 4. Place a dressing over the stoma if your health care provider told you to do that.  Giving a feeding Your health care provider will give you instructions about:  How much nutrition and fluid you will need for each  feeding.  How often to have a feeding.  Whether to take medicine in the tube by itself or with a feeding. To give yourself a feeding, follow these steps: 1. Lay out all of the equipment that you will need. 2. Make sure that the nutritional formula is at room temperature. 3. Wash your hands with soap and water. 4. Position yourself so that you are upright. You will need to stay upright throughout the feeding and for at least 30 minutes after the feeding. 5. Make sure the syringe plunger is pushed in. Place the tip of the syringe in clean water, and slowly pull the plunger to bring (draw up) the water into the syringe. 6. Remove the clamp and the cap from the PEG tube. 7. Push the water out of the syringe to clean (flush) the tube. 8. If the tube is clear, draw up the formula into the syringe. Make sure to use the right amount for each feeding and add water if necessary. 9. Slowly push the formula from the syringe through the tube. 10. After the feeding, flush the tube with water. 11. Put the clamp and the cap on the tube. Giving medicine To give yourself medicine, follow these steps: 1. Lay out all of the equipment that you will need. 2. If your medicine is in tablet form, crush the tablet and dissolve it in water. 3. Wash your hands with soap   and water. 4. Position yourself so that you are upright. You will need to stay upright while you give yourself medicine and for at least 30 minutes afterward. 5. Make sure the syringe plunger is pushed in. Place the tip of the syringe in clean water, and slowly pull the plunger to bring (draw up) the water into the syringe. 6. Remove the clamp and the cap from the PEG tube. 7. Push the water out of the syringe to clean (flush) the tube. 8. If the tube is clear, draw up the medicine into the syringe. 9. Slowly push the medicine from the syringe through the tube. 10. Flush the tube with water. 11. Put the clamp and the cap on the tube. Do not take  sustained release (SR) medicines through your tube. If you are unsure if your medicine is an SR medicine, ask your health care provider or pharmacist. Contact a health care provider if you have:  Soreness, redness, or irritation around your stoma.  Abdominal pain or bloating during or after your feedings.  Nausea, constipation, or diarrhea that will not go away.  A fever.  Problems with your PEG tube. Get help right away if:  Your tube is blocked.  Your tube falls out.  You have pain around your stoma.  You are bleeding from your stoma.  Your tube is leaking.  You choke or you have trouble breathing during or after a feeding. Summary  A percutaneous endoscopic gastrostomy (PEG) tube is used to deliver food and fluids directly into the stomach.  You will be taught how to use and adjust your PEG tube. You will also be taught how to care for the stoma in your abdomen.  Your health care provider will give you instructions on how to give yourself nutritional formula and medicines through your PEG tube.  Contact your health care provider if you have a fever or soreness, redness, or irritation around your stoma.  Get help right away if your tube leaks, is blocked, or falls out. Get help right away if you have pain or bleeding around your stoma. This information is not intended to replace advice given to you by your health care provider. Make sure you discuss any questions you have with your health care provider. Document Revised: 02/06/2019 Document Reviewed: 12/03/2017 Elsevier Patient Education  2020 [employer] Implanted Port Home Guide An implanted port is a device that is placed under the skin. It is usually placed in the chest. The device can be used to give IV medicine, to take blood, or for dialysis. You may have an implanted port if:  You need IV medicine that would be irritating to the small veins in your hands or arms.  You need IV medicines, such as antibiotics,  for a long period of time.  You need IV nutrition for a long period of time.  You need dialysis. Having a port means that your health care provider will not need to use the veins in your arms for these procedures. You may have fewer limitations when using a port than you would if you used other types of long-term IVs, and you will likely be able to return to normal activities after your incision heals. An implanted port has two main parts:  Reservoir. The reservoir is the part where a needle is inserted to give medicines or draw blood. The reservoir is round. After it is placed, it appears as a small, raised area under your skin.  Catheter. The catheter is a   thin, flexible tube that connects the reservoir to a vein. Medicine that is inserted into the reservoir goes into the catheter and then into the vein. How is my port accessed? To access your port:  A numbing cream may be placed on the skin over the port site.  Your health care provider will put on a mask and sterile gloves.  The skin over your port will be cleaned carefully with a germ-killing soap and allowed to dry.  Your health care provider will gently pinch the port and insert a needle into it.  Your health care provider will check for a blood return to make sure the port is in the vein and is not clogged.  If your port needs to remain accessed to get medicine continuously (constant infusion), your health care provider will place a clear bandage (dressing) over the needle site. The dressing and needle will need to be changed every week, or as told by your health care provider. What is flushing? Flushing helps keep the port from getting clogged. Follow instructions from your health care provider about how and when to flush the port. Ports are usually flushed with saline solution or a medicine called heparin. The need for flushing will depend on how the port is used:  If the port is only used from time to time to give medicines or  draw blood, the port may need to be flushed: ? Before and after medicines have been given. ? Before and after blood has been drawn. ? As part of routine maintenance. Flushing may be recommended every 4-6 weeks.  If a constant infusion is running, the port may not need to be flushed.  Throw away any syringes in a disposal container that is meant for sharp items (sharps container). You can buy a sharps container from a pharmacy, or you can make one by using an empty hard plastic bottle with a cover. How long will my port stay implanted? The port can stay in for as long as your health care provider thinks it is needed. When it is time for the port to come out, a surgery will be done to remove it. The surgery will be similar to the procedure that was done to put the port in. Follow these instructions at home:   Flush your port as told by your health care provider.  If you need an infusion over several days, follow instructions from your health care provider about how to take care of your port site. Make sure you: ? Wash your hands with soap and water before you change your dressing. If soap and water are not available, use alcohol-based hand sanitizer. ? Change your dressing as told by your health care provider. ? Place any used dressings or infusion bags into a plastic bag. Throw that bag in the trash. ? Keep the dressing that covers the needle clean and dry. Do not get it wet. ? Do not use scissors or sharp objects near the tube. ? Keep the tube clamped, unless it is being used.  Check your port site every day for signs of infection. Check for: ? Redness, swelling, or pain. ? Fluid or blood. ? Pus or a bad smell.  Protect the skin around the port site. ? Avoid wearing bra straps that rub or irritate the site. ? Protect the skin around your port from seat belts. Place a soft pad over your chest if needed.  Bathe or shower as told by your health care provider. The site may   get wet as long  as you are not actively receiving an infusion.  Return to your normal activities as told by your health care provider. Ask your health care provider what activities are safe for you.  Carry a medical alert card or wear a medical alert bracelet at all times. This will let health care providers know that you have an implanted port in case of an emergency. Get help right away if:  You have redness, swelling, or pain at the port site.  You have fluid or blood coming from your port site.  You have pus or a bad smell coming from the port site.  You have a fever. Summary  Implanted ports are usually placed in the chest for long-term IV access.  Follow instructions from your health care provider about flushing the port and changing bandages (dressings).  Take care of the area around your port by avoiding clothing that puts pressure on the area, and by watching for signs of infection.  Protect the skin around your port from seat belts. Place a soft pad over your chest if needed.  Get help right away if you have a fever or you have redness, swelling, pain, drainage, or a bad smell at the port site. This information is not intended to replace advice given to you by your health care provider. Make sure you discuss any questions you have with your health care provider. Document Revised: 03/14/2019 Document Reviewed: 12/23/2016 Elsevier Patient Education  2020 [employer]      General Anesthesia, Adult, Care After This sheet gives you information about how to care for yourself after your procedure. Your health care provider may also give you more specific instructions. If you have problems or questions, contact your health care provider. What can I expect after the procedure? After the procedure, the following side effects are common:  Pain or discomfort at the IV site.  Nausea.  Vomiting.  Sore throat.  Trouble concentrating.  Feeling cold or chills.  Weak or  tired.  Sleepiness and fatigue.  Soreness and body aches. These side effects can affect parts of the body that were not involved in surgery. Follow these instructions at home:  For at least 24 hours after the procedure:  Have a responsible adult stay with you. It is important to have someone help care for you until you are awake and alert.  Rest as needed.  Do not: ? Participate in activities in which you could fall or become injured. ? Drive. ? Use heavy machinery. ? Drink alcohol. ? Take sleeping pills or medicines that cause drowsiness. ? Make important decisions or sign legal documents. ? Take care of children on your own. Eating and drinking  Follow any instructions from your health care provider about eating or drinking restrictions.  When you feel hungry, start by eating small amounts of foods that are soft and easy to digest (bland), such as toast. Gradually return to your regular diet.  Drink enough fluid to keep your urine pale yellow.  If you vomit, rehydrate by drinking water, juice, or clear broth. General instructions  If you have sleep apnea, surgery and certain medicines can increase your risk for breathing problems. Follow instructions from your health care provider about wearing your sleep device: ? Anytime you are sleeping, including during daytime naps. ? While taking prescription pain medicines, sleeping medicines, or medicines that make you drowsy.  Return to your normal activities as told by your health care provider. Ask your health care   provider what activities are safe for you.  Take over-the-counter and prescription medicines only as told by your health care provider.  If you smoke, do not smoke without supervision.  Keep all follow-up visits as told by your health care provider. This is important. Contact a health care provider if:  You have nausea or vomiting that does not get better with medicine.  You cannot eat or drink without  vomiting.  You have pain that does not get better with medicine.  You are unable to pass urine.  You develop a skin rash.  You have a fever.  You have redness around your IV site that gets worse. Get help right away if:  You have difficulty breathing.  You have chest pain.  You have blood in your urine or stool, or you vomit blood. Summary  After the procedure, it is common to have a sore throat or nausea. It is also common to feel tired.  Have a responsible adult stay with you for the first 24 hours after general anesthesia. It is important to have someone help care for you until you are awake and alert.  When you feel hungry, start by eating small amounts of foods that are soft and easy to digest (bland), such as toast. Gradually return to your regular diet.  Drink enough fluid to keep your urine pale yellow.  Return to your normal activities as told by your health care provider. Ask your health care provider what activities are safe for you. This information is not intended to replace advice given to you by your health care provider. Make sure you discuss any questions you have with your health care provider. Document Revised: 11/23/2017 Document Reviewed: 07/06/2017 Elsevier Patient Education  2020 [employer]  

## 2019-12-24 NOTE — Transfer of Care (Signed)
Immediate Anesthesia Transfer of Care Note  Patient: Timothy Oconnor  Procedure(s) Performed: ESOPHAGOGASTRODUODENOSCOPY (EGD) WITH PROPOFOL (N/A Esophagus) PERCUTANEOUS ENDOSCOPIC GASTROSTOMY (PEG) PLACEMENT (N/A Abdomen) INSERTION PORT-A-CATH (Left Chest)  Patient Location: PACU  Anesthesia Type:General  Level of Consciousness: awake  Airway & Oxygen Therapy: Patient Spontanous Breathing  Post-op Assessment: Report given to RN  Post vital signs: Reviewed and stable  Last Vitals:  Vitals Value Taken Time  BP 104/70 12/24/19 1115  Temp    Pulse 58 12/24/19 1117  Resp 17 12/24/19 1117  SpO2 93 % 12/24/19 1117  Vitals shown include unvalidated device data.  Last Pain:  Vitals:   12/24/19 0830  TempSrc: Oral  PainSc: 0-No pain      Patients Stated Pain Goal: 8 ([id number])  Complications: No apparent anesthesia complications

## 2019-12-24 NOTE — Anesthesia Postprocedure Evaluation (Signed)
Anesthesia Post Note  Patient: Timothy Oconnor  Procedure(s) Performed: ESOPHAGOGASTRODUODENOSCOPY (EGD) WITH PROPOFOL (N/A Esophagus) PERCUTANEOUS ENDOSCOPIC GASTROSTOMY (PEG) PLACEMENT (N/A Abdomen) INSERTION PORT-A-CATH (Left Chest)  Patient location during evaluation: Short Stay Anesthesia Type: General Level of consciousness: awake and alert and oriented Pain management: pain level controlled Vital Signs Assessment: post-procedure vital signs reviewed and stable Respiratory status: spontaneous breathing Cardiovascular status: blood pressure returned to baseline and stable Postop Assessment: no apparent nausea or vomiting Anesthetic complications: no     Last Vitals:  Vitals:   12/24/19 1203 12/24/19 1211  BP: 112/73 105/62  Pulse: (!) 46 (!) 48  Resp: 14 18  Temp:  36.7 C  SpO2: 100% 100%    Last Pain:  Vitals:   12/24/19 1211  TempSrc: Oral  PainSc: 0-No pain                 Duey Liller

## 2019-12-24 NOTE — Op Note (Signed)
Patient:  Linkon Siverson  DOB:  Sep 26, 1951  MRN:  [id number]   Preop Diagnosis: Adenocarcinoma of GE junction  Postop Diagnosis: Same  Procedure: Port-A-Cath insertion, EGD with PEG  Surgeon: Aviva Signs, MD  Assistant: Curlene Labrum, MD  Anes: MAC  Indications: Patient is a 69 year old white male recently diagnosed with adenocarcinoma of the gastroesophageal juncture who now presents for both a PEG due to dysphagia and a Port-A-Cath for central venous access for chemotherapy.  The risks and benefits of both procedures including bleeding, infection, bowel injury, and pneumothorax were fully explained to the patient, who gave informed consent.  Procedure note: I initially started with the Port-A-Cath placement.  Left upper chest was prepped and draped using the usual sterile technique with ChloraPrep.  Surgical site confirmation was performed.  1% Xylocaine was used for local anesthesia.  An incision was made below the left clavicle.  A subcutaneous pocket was formed.  A needle was advanced into the left subclavian vein using the Seldinger technique without difficulty.  A guidewire was then advanced into the right atrium under fluoroscopic guidance.  An introducer and peel-away sheath were placed over the guidewire.  The catheter was inserted through the peel-away sheath and the peel-away sheath was removed.  The catheter was then attached to the port and the port placed in subcutaneous pocket.  Adequate positioning was confirmed by fluoroscopy.  Good backflow of venous blood was noted on aspiration of the port.  The port was flushed with heparin flush.  The subcutaneous layer was reapproximated using a 3-0 Vicryl interrupted suture.  The skin was closed using a 4-0 Monocryl subcuticular suture.  Dermabond was applied.  Next, we performed an EGD with PEG.  The endoscope was advanced down into the second portion of the duodenum with no significant difficulty.  I did visualize the GE junction  area where the tumor was at.  There was a mild narrowing of the lumen, but I was able to pass the endoscope through this.  The first and second portions of the duodenum were within normal limits.  No abnormal lesions were noted.  The pylorus was noted to be widely patent.  An area in the epigastric region was palpated and visualized fully on endoscopy.  1% Xylocaine was used for local anesthesia.  A needle catheter was then advanced under direct visualization into the antrum without difficulty.  A guidewire was then advanced into the antrum and was grasped using the snare.  The endoscope along with the guidewire was then retracted from the stomach and esophagus.  A 20 [place] Ponsky gastrostomy tube was then attached to the guidewire and pulled through the mouth, esophagus, stomach and abdominal wall to the 3-1/2 cm Duru Reiger without difficulty.  Given the narrowing at the jot gastroesophageal juncture, I elected not to replace the endoscope in order to prevent any further irritation of the friable tissue.  A bolster was placed at the 3-1/2 to 4 cm Aceyn Kathol at the skin level.  Triple antibiotic ointment was placed around the exit site.  A dry sterile dressing was then applied.  All tape needle counts were correct after both procedures.  The patient tolerated the procedure well.  He was transferred to PACU in stable condition.  A chest x-ray will be performed at that time.    Complications: None  EBL: Minimal  Specimen: None

## 2019-12-24 NOTE — Interval H&P Note (Signed)
History and Physical Interval Note:  12/24/2019 8:48 AM  Timothy Oconnor  has presented today for surgery, with the diagnosis of GASTRO ESOPHAGEAL CANCER.  The various methods of treatment have been discussed with the patient and family. After consideration of risks, benefits and other options for treatment, the patient has consented to  Procedure(s): ESOPHAGOGASTRODUODENOSCOPY (EGD) WITH PROPOFOL (N/A) PERCUTANEOUS ENDOSCOPIC GASTROSTOMY (PEG) PLACEMENT (N/A) INSERTION PORT-A-CATH (Left) as a surgical intervention.  The patient's history has been reviewed, patient examined, no change in status, stable for surgery.  I have reviewed the patient's chart and labs.  Questions were answered to the patient's satisfaction.     Aviva Signs

## 2019-12-25 ENCOUNTER — Telehealth (HOSPITAL_COMMUNITY): Payer: Self-pay

## 2019-12-25 NOTE — Telephone Encounter (Signed)
Nutrition Follow-up:  Spoke with patient this am following placement of PEG tube on 1/20.  Patient reports that he is doing well just a little sore.  Has not talked with home health nursing or [facility] (supplying formula and enteral supplies).    Patient is continuing to drink liquids as main source of nutrition.    Intervention: Reviewed starting tube feeding regimen with patient over the phone.  Once patient has enteral supply will start with 1/2 carton of tube feeding QID.  He will add 1/2 carton of tube feeding Daily to his regimen until goal rate.  Water flush of 21ml before and after.  Will need to drink or give additional 283ml TID via PEG tube for hydration.   Patient knows that he can continue to drink liquids including oral nutrition supplements as tolerated.   RD has spoken with Romualdo Bolk regarding home health nursing and left message for Blake Divine regarding enteral supplies.      NEXT VISIT: phone f/u tomorrow  Lyllian Gause B. Zenia Resides, [place], [place] Registered Dietitian [phone] (pager)

## 2019-12-26 ENCOUNTER — Ambulatory Visit (HOSPITAL_COMMUNITY): Payer: Medicare HMO

## 2019-12-26 NOTE — Progress Notes (Signed)
Nutrition Follow-up:  Patient with new diagnosis of esophageal adenocarcinoma.  PEG placed on 1/20 by Dr. Arnoldo Morale.  Spoke with patient via phone for follow-up.  Patient reports receiving enteral supplies yesterday evening and home health nurse came out after supplies were dropped off.  Patient was able to give 1/2 carton of tube feeding 2 different times yesterday and tolerated well. Reports gave 1 full carton this am and will give 1/2 carton at reminder of feedings for today.  He does not have any concerns at this time and has tolerated nutrition well and feels comfortable giving feeding via PEG.     Estimated Energy Needs  Kcals: [phone] Protein: 112-150 g Fluid: > 2 L  NUTRITION DIAGNOSIS: Inadequate oral intake continues   INTERVENTION:  Patient will continue titrating osmolite 1.5 by adding 1/2 carton daily until reaches 4 full cartons. Further titration will be added pending oral intake and weight.   Patient denies questions at this time and feels comfortable with new feeding tube.   Patient has contact information    MONITORING, EVALUATION, GOAL: Patient will utilize feeding tube and oral intake to receive adequate calories and protein to preserve lean muscle mass.      NEXT VISIT: next week  Kourtlyn Charlet B. Zenia Resides, [place], [place] Registered Dietitian [phone] (pager)

## 2019-12-27 ENCOUNTER — Ambulatory Visit (HOSPITAL_COMMUNITY)
Admission: RE | Admit: 2019-12-27 | Discharge: 2019-12-27 | Disposition: A | Discharge status: Home / Self Care | Payer: Medicare HMO | Source: Ambulatory Visit | Attending: Hematology | Admitting: Hematology

## 2019-12-27 ENCOUNTER — Ambulatory Visit (HOSPITAL_COMMUNITY)
Admission: RE | Admit: 2019-12-27 | Discharge: 2019-12-27 | Disposition: A | Discharge status: Home / Self Care | Payer: Medicare HMO | Source: Ambulatory Visit

## 2019-12-27 ENCOUNTER — Other Ambulatory Visit: Payer: Self-pay

## 2019-12-27 DIAGNOSIS — C160 Malignant neoplasm of cardia: Secondary | ICD-10-CM

## 2019-12-27 MED ORDER — GADOBUTROL 1 MMOL/ML IV SOLN
9.0000 mL | Freq: Once | INTRAVENOUS | Status: AC | PRN
  Administered 2019-12-27: 9 mL via INTRAVENOUS

## 2019-12-29 ENCOUNTER — Other Ambulatory Visit (HOSPITAL_COMMUNITY): Payer: Self-pay | Admitting: Hematology

## 2019-12-29 ENCOUNTER — Encounter (HOSPITAL_COMMUNITY): Payer: Self-pay | Admitting: Hematology

## 2019-12-29 ENCOUNTER — Other Ambulatory Visit: Payer: Self-pay

## 2019-12-29 ENCOUNTER — Telehealth (HOSPITAL_COMMUNITY): Payer: Self-pay | Admitting: *Deleted

## 2019-12-29 ENCOUNTER — Inpatient Hospital Stay (HOSPITAL_COMMUNITY): Payer: Medicare HMO | Admitting: Hematology

## 2019-12-29 VITALS — BP 109/69 | HR 59 | Temp 97.7°F | Resp 18 | Wt 209.9 lb

## 2019-12-29 DIAGNOSIS — Z7189 Other specified counseling: Secondary | ICD-10-CM | POA: Diagnosis not present

## 2019-12-29 DIAGNOSIS — C160 Malignant neoplasm of cardia: Secondary | ICD-10-CM

## 2019-12-29 DIAGNOSIS — R69 Illness, unspecified: Secondary | ICD-10-CM | POA: Diagnosis not present

## 2019-12-29 DIAGNOSIS — F419 Anxiety disorder, unspecified: Secondary | ICD-10-CM

## 2019-12-29 DIAGNOSIS — C155 Malignant neoplasm of lower third of esophagus: Secondary | ICD-10-CM | POA: Diagnosis not present

## 2019-12-29 MED ORDER — ALPRAZOLAM 0.5 MG PO TABS: 0.5000 mg | ORAL_TABLET | Freq: Two times a day (BID) | ORAL | 0 refills | Status: DC | PRN

## 2019-12-29 MED ORDER — GADOBUTROL 1 MMOL/ML IV SOLN
9.0000 mL | Freq: Once | INTRAVENOUS | Status: AC | PRN
  Administered 2019-12-29: 14:00:00 9 mL via INTRAVENOUS

## 2019-12-29 NOTE — Progress Notes (Signed)
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  CURRENT THERAPY: FOLFOX and Opdivo.  BRIEF ONCOLOGIC HISTORY:  Oncology History  Esophageal cancer [facility])   Initial Diagnosis   Esophageal cancer ([place])   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[place])  12/24/2019 Initial Diagnosis   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[place])   12/29/2019 Cancer Staging   Staging form: Esophagus - Adenocarcinoma, AJCC 8th Edition - Clinical stage from 12/29/2019: Stage IVB (cTX, cN3, cM1) - Signed by Derek Jack, MD on 12/29/2019   12/30/2019 -  Chemotherapy   The patient had palonosetron (ALOXI) injection 0.25 mg, 0.25 mg, Intravenous,  Once, 0 of 6 cycles leucovorin 868 mg in dextrose 5 % 250 mL infusion, 400 mg/m2, Intravenous,  Once, 0 of 6 cycles oxaliplatin (ELOXATIN) 185 mg in dextrose 5 % 500 mL chemo infusion, 85 mg/m2, Intravenous,  Once, 0 of 6 cycles fluorouracil (ADRUCIL) chemo injection 850 mg, 400 mg/m2, Intravenous,  Once, 0 of 6 cycles fluorouracil (ADRUCIL) 5,200 mg in sodium chloride 0.9 % 146 mL chemo infusion, 2,400 mg/m2 = 5,200 mg, Intravenous, 1 Day/Dose, 0 of 6 cycles nivolumab (OPDIVO) 240 mg in sodium chloride 0.9 % 100 mL chemo infusion, 240 mg (original dose ), Intravenous, Once, 0 of 6 cycles Dose modification: 240 mg (Cycle 1, Reason: Provider Judgment)  for chemotherapy treatment.       CANCER STAGING: Cancer Staging Adenocarcinoma of gastroesophageal junction [facility]) Staging form: Esophagus - Adenocarcinoma, AJCC 8th Edition - Clinical stage from 12/29/2019: Stage IVB (cTX, cN3, cM1) - Signed by Derek Jack, MD on 12/29/2019    INTERVAL HISTORY:  Timothy Oconnor 69 y.o. male seen for follow-up of GE junction adenocarcinoma.  He had port and  PEG tube placed on 12/24/2019.  He is taking up to 4 cans of Osmolite daily.  He is also able to drink 4 cans of boost/Ensure.  Overall he gained about 4 pounds.  Appetite is 50%.  Energy levels improved to 75%.  Having some increased urinary frequency at nighttime.  Has some anxiety problems.    REVIEW OF SYSTEMS:  Review of Systems  Genitourinary: Positive for frequency.   Psychiatric/Behavioral: Positive for sleep disturbance. The patient is nervous/anxious.   All other systems reviewed and are negative.    PAST MEDICAL/SURGICAL HISTORY:  Past Medical History:  Diagnosis Date  . 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[place])   . Anxiety   . Diabetes ([place])   . Heart attack ([place])   . Heart disease   . High cholesterol   . Hyperplasia of prostate   . Hypertension   . Low back pain   . Tremor    Past Surgical History:  Procedure Laterality Date  . arterectomy     1993  . BIOPSY  12/01/2019   Procedure: BIOPSY;  Surgeon: Rogene Houston, MD;  Location: [facility];  Service: Endoscopy;;  esophagus  . ESOPHAGOGASTRODUODENOSCOPY (EGD) WITH PROPOFOL N/A 12/01/2019   Procedure: ESOPHAGOGASTRODUODENOSCOPY (EGD) WITH PROPOFOL;  Surgeon: Rogene Houston, MD;  Location: [facility];  Service: Endoscopy;  Laterality: N/A;  . ESOPHAGOGASTRODUODENOSCOPY (EGD) WITH PROPOFOL N/A 12/24/2019   Procedure: ESOPHAGOGASTRODUODENOSCOPY (EGD) WITH PROPOFOL;  Surgeon: Aviva Signs, MD;  Location: [facility] ORS;  Service: General;  Laterality: N/A;  . Heart stents     2002,  2004, 2006  . PEG PLACEMENT N/A 12/24/2019   Procedure: PERCUTANEOUS ENDOSCOPIC GASTROSTOMY (PEG) PLACEMENT;  Surgeon: Aviva Signs, MD;  Location: [facility] ORS;  Service: General;  Laterality: N/A;  . PORTACATH PLACEMENT Left 12/24/2019   Procedure: INSERTION PORT-A-CATH;  Surgeon: Aviva Signs, MD;  Location: [facility] ORS;  Service: General;  Laterality: Left;     SOCIAL HISTORY:  Social History   Socioeconomic History  . Marital  status: Married    Spouse name: Not on file  . Number of children: 2  . Years of education: 56  . Highest education level: Not on file  Occupational History  . Occupation: [employer]    Comment: Delivers medication  Tobacco Use  . Smoking status: Former Smoker    Packs/day: 1.00    Years: 50.00    Pack years: 50.00    Quit date: 12/21/2017    Years since quitting: 2.0  . Smokeless tobacco: Never Used  . Tobacco comment: Quit 2017  Substance and Sexual Activity  . Alcohol use: Yes    Alcohol/week: 0.0 standard drinks    Comment: 3 drinks per week  . Drug use: No  . Sexual activity: Yes  Other Topics Concern  . Not on file  Social History Narrative   Lives at home with his wife.   Right-handed.   2 diet cokes per day.   Social Determinants of Health   Financial Resource Strain: Low Risk   . Difficulty of Paying Living Expenses: Not hard at all  Food Insecurity: No Food Insecurity  . Worried About Charity fundraiser in the Last Year: Never true  . Ran Out of Food in the Last Year: Never true  Transportation Needs: No Transportation Needs  . Lack of Transportation (Medical): No  . Lack of Transportation (Non-Medical): No  Physical Activity: Insufficiently Active  . Days of Exercise per Week: 2 days  . Minutes of Exercise per Session: 30 min  Stress: No Stress Concern Present  . Feeling of Stress : Only a little  Social Connections: Not Isolated  . Frequency of Communication with Friends and Family: More than three times a week  . Frequency of Social Gatherings with Friends and Family: More than three times a week  . Attends Religious Services: 1 to 4 times per year  . Active Member of Clubs or Organizations: No  . Attends Archivist Meetings: 1 to 4 times per year  . Marital Status: Married  Human resources officer Violence: Not At Risk  . Fear of Current or Ex-Partner: No  . Emotionally Abused: No  . Physically Abused: No  . Sexually Abused: No    FAMILY  HISTORY:  Family History  Problem Relation Age of Onset  . Alzheimer's disease Mother   . Diabetes Father   . Heart disease Father   . Stroke Father   . Heart disease Brother   . Colon cancer Brother   . Alzheimer's disease Sister     CURRENT MEDICATIONS:  Outpatient Encounter Medications as of 12/29/2019  Medication Sig Note  . ascorbic acid (VITAMIN C) 500 MG tablet Take 500 mg by mouth 2 (two) times a week.    Marland Kitchen aspirin EC 81 MG tablet Take 81 mg by mouth daily.    Marland Kitchen atorvastatin (LIPITOR) 80 MG tablet Take 80 mg by mouth every evening.    . ezetimibe (ZETIA) 10 MG tablet Take 10 mg by mouth daily.    . folic acid (FOLVITE) 161 MCG tablet Take  800 mcg by mouth daily.    . isosorbide mononitrate (IMDUR) 30 MG 24 hr tablet Take 30 mg by mouth daily.    Marland Kitchen losartan (COZAAR) 25 MG tablet Take 25 mg by mouth daily.    . metFORMIN (GLUCOPHAGE-XR) 500 MG 24 hr tablet Take 500 mg by mouth daily with breakfast.   . metoprolol tartrate (LOPRESSOR) 25 MG tablet Take 0.5 tablets (12.5 mg total) by mouth 2 (two) times daily. (Patient taking differently: Take 25 mg by mouth 2 (two) times daily. )   . Multiple Vitamin (MULTI-VITAMINS) TABS Take 1 tablet by mouth daily.    . Nutritional Supplements (FEEDING SUPPLEMENT, OSMOLITE 1.5 CAL,) LIQD Give 1 1/2 cartons of osmolite 4 times per day via PEG.  Flush with 63m of water before and after. Drink or give via tube additional 240 ml water TID. Provides 2130 calories, 89 g protein and 22881mfree water per day; meets 100% estimated needs.  Send formula and bolus supplies.   . tamsulosin (FLOMAX) 0.4 MG CAPS capsule Take 0.4 mg by mouth daily.    . [DISCONTINUED] Omega-3 Fatty Acids (FISH OIL) 1000 MG CAPS Take 1,000 mg by mouth 2 (two) times daily.  12/19/2019: On hold  . Glucosamine HCl (GLUCOSAMINE PO) Take 120 mg by mouth 2 (two) times daily. 12/19/2019: On hold  . HYDROcodone-acetaminophen (HYCET) 7.5-325 mg/15 ml solution Take 15 mLs by mouth every 4  (four) hours as needed for moderate pain. (Patient not taking: Reported on 12/29/2019)   . Saw Palmetto 450 MG CAPS Take 1 capsule by mouth daily.  12/19/2019: On hold  . [DISCONTINUED] diazepam (VALIUM) 5 MG tablet Take 1 tablet (5 mg total) by mouth as directed. Take 1 tab 30 minutes prior to appointment. Taken another 5 minute prior to appointment    No facility-administered encounter medications on file as of 12/29/2019.    ALLERGIES:  No Known Allergies   PHYSICAL EXAM:  ECOG Performance status: 1  Vitals:   12/29/19 1542  BP: 109/69  Pulse: (!) 59  Resp: 18  Temp: 97.7 F (36.5 C)  SpO2: 99%   Filed Weights   12/29/19 1542  Weight: 209 lb 14.4 oz (95.2 kg)    Physical Exam Vitals reviewed.  Constitutional:      Appearance: Normal appearance.  Cardiovascular:     Rate and Rhythm: Normal rate and regular rhythm.     Heart sounds: Normal heart sounds.  Pulmonary:     Effort: Pulmonary effort is normal.     Breath sounds: Normal breath sounds.  Abdominal:     General: There is no distension.     Palpations: Abdomen is soft.  Skin:    General: Skin is warm.  Neurological:     General: No focal deficit present.     Mental Status: He is alert and oriented to person, place, and time.  Psychiatric:        Mood and Affect: Mood normal.        Behavior: Behavior normal.      LABORATORY DATA:  I have reviewed the labs as listed.  CBC    Component Value Date/Time   WBC 7.0 12/14/2019 0230   RBC 4.31 12/14/2019 0230   HGB 13.6 12/14/2019 0230   HCT 41.7 12/14/2019 0230   PLT 289 12/14/2019 0230   MCV 96.8 12/14/2019 0230   MCH 31.6 12/14/2019 0230   MCHC 32.6 12/14/2019 0230   RDW 13.0 12/14/2019 0230   LYMPHSABS 0.9 12/03/2019 1017  MONOABS 0.7 12/03/2019 1017   EOSABS 0.1 12/03/2019 1017   BASOSABS 0.0 12/03/2019 1017   CMP Latest Ref Rng & Units 12/14/2019 11/30/2019  Glucose 70 - 99 mg/dL 182(H) 124(H)  BUN 8 - 23 mg/dL 16 9  Creatinine 0.61 - 1.24  mg/dL 0.76 0.81  Sodium 135 - 145 mmol/L 138 141  Potassium 3.5 - 5.1 mmol/L 4.1 4.1  Chloride 98 - 111 mmol/L 106 104  CO2 22 - 32 mmol/L 28 25  Calcium 8.9 - 10.3 mg/dL 8.8(L) 9.1  Total Protein 6.5 - 8.1 g/dL - 6.7  Total Bilirubin 0.3 - 1.2 mg/dL - 1.2  Alkaline Phos 38 - 126 U/L - 38  AST 15 - 41 U/L - 16  ALT 0 - 44 U/L - 19       DIAGNOSTIC IMAGING:  I have independently reviewed the scans and discussed with the patient.     ASSESSMENT & PLAN:   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HCC) 1.  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 -EGD on 12/01/2019 shows normal proximal and mid esophagus with food found in the midesophagus.  Malignant appearing intrinsic severe stenosis found at 35-40 cm from the incisors.  Stenosis measured 1 cm in length.  Normal stomach, duodenal bulb and second part of duodenum. -Biopsy consistent with adenocarcinoma in the background of intestinal metaplasia consistent with Barrett's esophagus. -PET CT scan on 12/08/2019 shows known distal esophageal neoplasm with hypermetabolic nodal metastasis in the supraclavicular region, mediastinum and upper abdomen.  No hypermetabolic metastatic disease in the liver.  Hypermetabolic lesion in the right femoral neck with no CT correlate.  Anterior left iliac lesion with no CT lesion evident.  Hypermetabolism noted at L2 vertebral body probably in the right transverse process of T2. -We reviewed results of the MRI of the pelvis from 12/27/2019 which showed pelvic osseous meta stasis in the right intertrochanteric femur measuring 1.9 x 2.2 cm.  1.0 x 1.5 cm enhancing lesion in the S1 vertebral body.  1.1 x 0.8 cm enhancing lesion in the left anterior iliac bone. -HER-2 by IHC was negative.  PD-L1 CPS score is 5. -Foundation 1 shows MS-stable, with no other targetable mutations. -We had a prolonged discussion about the  normal prognosis of stage IV GE junction adenocarcinoma.  I have requested HER-2 by FISH to complete the work-up.  Goal of therapy is palliative. -He had port placed last week.  We talked about palliative chemotherapy with FOLFOX.  As HER-2 is negative by IHC and PD-L1 CPS is 5, I have also recommended adding nivolumab to the regimen. -We talked about side effects in detail.  2.  Dysphagia/weight loss: -He had a PEG tube placed on 12/24/2019. -He is taking in 4 cans of Osmolite daily. -He is also drinking 4 cans of Ensure/boost every day.  He gained 4 pounds since last visit.  3.  Peripheral neuropathy: -He has mild neuropathy in the feet from underlying diabetes.  Not affecting his gait.  4.  Anxiety: -I will start him on Xanax 0.5 mg as needed.  We will titrate it based on effectiveness.      Orders placed this encounter:  No orders of the defined types were placed in this encounter. Total time spent is 40 minutes with more than 50% of the time spent face-to-face discussing treatment plan, counseling and coordination of care.    Derek Jack, MD [facility] [phone]  

## 2019-12-29 NOTE — Assessment & Plan Note (Addendum)
1.  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 -EGD on 12/01/2019 shows normal proximal and mid esophagus with food found in the midesophagus.  Malignant appearing intrinsic severe stenosis found at 35-40 cm from the incisors.  Stenosis measured 1 cm in length.  Normal stomach, duodenal bulb and second part of duodenum. -Biopsy consistent with adenocarcinoma in the background of intestinal metaplasia consistent with Barrett's esophagus. -PET CT scan on 12/08/2019 shows known distal esophageal neoplasm with hypermetabolic nodal metastasis in the supraclavicular region, mediastinum and upper abdomen.  No hypermetabolic metastatic disease in the liver.  Hypermetabolic lesion in the right femoral neck with no CT correlate.  Anterior left iliac lesion with no CT lesion evident.  Hypermetabolism noted at L2 vertebral body probably in the right transverse process of T2. -We reviewed results of the MRI of the pelvis from 12/27/2019 which showed pelvic osseous meta stasis in the right intertrochanteric femur measuring 1.9 x 2.2 cm.  1.0 x 1.5 cm enhancing lesion in the S1 vertebral body.  1.1 x 0.8 cm enhancing lesion in the left anterior iliac bone. -HER-2 by IHC was negative.  PD-L1 CPS score is 5. -Foundation 1 shows MS-stable, with no other targetable mutations. -We had a prolonged discussion about the normal prognosis of stage IV GE junction adenocarcinoma.  I have requested HER-2 by FISH to complete the work-up.  Goal of therapy is palliative. -He had port placed last week.  We talked about palliative chemotherapy with FOLFOX.  As HER-2 is negative by IHC and PD-L1 CPS is 5, I have also recommended adding nivolumab to the regimen. -We talked about side effects in detail.  2.  Dysphagia/weight loss: -He had a PEG tube placed on 12/24/2019. -He is taking in 4 cans of Osmolite daily. -He is also drinking 4 cans of  Ensure/boost every day.  He gained 4 pounds since last visit.  3.  Peripheral neuropathy: -He has mild neuropathy in the feet from underlying diabetes.  Not affecting his gait.  4.  Anxiety: -I will start him on Xanax 0.5 mg as needed.  We will titrate it based on effectiveness.

## 2019-12-29 NOTE — Progress Notes (Signed)
START ON PATHWAY REGIMEN - Gastroesophageal     A cycle is every 14 days:     Oxaliplatin      Leucovorin      Fluorouracil      Fluorouracil   **Always confirm dose/schedule in your pharmacy ordering system**  Patient Characteristics: Distant Metastases (cM1/pM1) / Locally Recurrent Disease, Adenocarcinoma - Esophageal, GE Junction, and Gastric, First Line, HER2 Negative / Unknown Histology: Adenocarcinoma Disease Classification: GE Junction Therapeutic Status: Distant Metastases (No Additional Staging) Line of Therapy: First Line HER2 Status: Negative Intent of Therapy: Non-Curative / Palliative Intent, Discussed with Patient

## 2019-12-29 NOTE — Telephone Encounter (Signed)
Called to discuss the Cardiac program. No answer. Left message for patient to call us back.

## 2019-12-30 ENCOUNTER — Encounter (HOSPITAL_COMMUNITY): Payer: Self-pay

## 2019-12-30 ENCOUNTER — Inpatient Hospital Stay (HOSPITAL_COMMUNITY): Payer: Medicare HMO | Admitting: General Practice

## 2019-12-30 ENCOUNTER — Telehealth (HOSPITAL_COMMUNITY): Payer: Self-pay | Admitting: *Deleted

## 2019-12-30 DIAGNOSIS — C160 Malignant neoplasm of cardia: Secondary | ICD-10-CM

## 2019-12-30 DIAGNOSIS — Z95828 Presence of other vascular implants and grafts: Secondary | ICD-10-CM | POA: Insufficient documentation

## 2019-12-30 HISTORY — DX: Presence of other vascular implants and grafts: Z95.828

## 2019-12-30 MED ORDER — PROCHLORPERAZINE MALEATE 10 MG PO TABS: 10.0000 mg | ORAL_TABLET | Freq: Four times a day (QID) | ORAL | 1 refills | Status: DC | PRN

## 2019-12-30 MED ORDER — LIDOCAINE-PRILOCAINE 2.5-2.5 % EX CREA: TOPICAL_CREAM | CUTANEOUS | 3 refills | Status: DC

## 2019-12-30 NOTE — Patient Instructions (Addendum)
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  You will be treated every 2 weeks with a combination of chemotherapy and immunotherapy drugs.  FOLFOX is the acronym for the chemotherapy drugs you will receive.  Those are oxaliplatin, leucovorin, and fluorouracil (5-FU).  The immunotherapy drug you will receive is called nivolumab (Opdivo).  Opdivo is a targeted therapy that works with your body's immune system to help it kill cancer cells as intended.  The intent of this treatment is to control your cancer, keep it from spreading further, and to help alleviate any symptoms you may be having related to your disease. You will see the doctor regularly throughout treatment.  We will obtain blood work from you prior to every treatment and monitor your results to make sure it is safe to give your treatment. The doctor monitors your response to treatment by the way you are feeling, your blood work, and by obtaining scans periodically. There will be wait times while you are here for treatment.  It will take about 30 minutes to 1 hour for your lab work to result.  Then there will be wait times while pharmacy mixes your medications.    Medications you will receive in the clinic prior to your chemotherapy medications:  Aloxi:  ALOXI is used in adults to help prevent nausea and vomiting that happens with certain chemotherapy drugs.  Aloxi is a long acting medication, and will remain in your system for about two days.   Dexamethasone:  This is a steroid given prior to chemotherapy to help prevent allergic reactions; it may also help prevent and control nausea and diarrhea.    Nivolumab (Opdivo)  About This Drug  Nivolumab is used to treat cancer. It is given in the vein (IV). It will take 30 minutes to infuse.   Possible Side Effects  . Bone marrow depression. This is a decrease in the number of white blood  cells, red blood cells, and platelets. This may raise your risk of infection, make you tired and weak (fatigue), and raise your risk of bleeding.  . Tiredness and weakness  . Joint, muscle and bone pain  . Back pain  . Loose bowel movements (diarrhea)  . Nausea  . Decreased appetite (decreased hunger)  . Constipation (not able to move bowels)  . Cough and trouble breathing  . Upper respiratory infection  . Fever  . Rash and itching  . Electrolyte changes  Note: Each of the side effects above was reported in 20% or greater of patients treated with nivolumab. Not all possible side effects are included above. Your side effects may be different or more severe if you receive nivolumab in combination with other chemotherapy agents.  Warnings and Precautions  . This drug works with your immune system and can cause inflammation in any of your organs and tissues and can change how they work. This may put you at risk for developing serious medical problems which can very rarely be fatal.  . Colitis (swelling (inflammation) in the colon) - symptoms are loose bowel movements (diarrhea) stomach cramping, and sometimes blood in the bowel movements  . Changes in liver function  . Changes in kidney function  . Inflammation (swelling) of the lungs which can very rarely be fatal - you may have a dry cough or trouble breathing.  . This drug may affect some of your hormone glands (especially the thyroid, adrenals, pituitary and pancreas).  Marland Kitchen  Blood sugar levels may change and you may develop diabetes. If you already have diabetes, changes may need to be made to your diabetes medication.  . Severe allergic skin reaction which can very rarely be fatal. You may develop blisters on your skin that are filled with fluid or a severe red rash all over your body that may be painful.  . Changes in your central nervous system can happen. The central nervous system is made up of your brain and spinal  cord. You could feel extreme tiredness, agitation, confusion, hallucinations (see or hear things that are not there), trouble understanding or speaking, loss of control of your bowels or bladder, eyesight changes, numbness or lack of strength to your arms, legs, face, or body, and coma. If you start to have any of these symptoms let your doctor know right away.  . While you are getting this drug in your vein (IV), you may have a reaction to the drug. Sometimes you may be given medication to stop or lessen these side effects. Your nurse will check you closely for these signs: fever or shaking chills, flushing, facial swelling, feeling dizzy, headache, trouble breathing, rash, itching, chest tightness, or chest pain. These reactions may happen after your infusion. If this happens, call 911 for emergency care.  . Increased risk of complications, which may very rarely be fatal, in patients who will undergo a stem cell transplant after receiving nivolumab.  Important Information  . This drug may be present in the saliva, tears, sweat, urine, stool, vomit, semen, and vaginal secretions. Talk to your doctor and/or your nurse about the necessary precautions to take during this time.  Treating Side Effects  . To decrease infection, wash your hands regularly  . Avoid close contact with people who have a cold, the flu, or other infections.  . Take your temperature as your doctor or nurse tells you, and whenever you feel like you may have a fever  . To help decrease bleeding, use a soft toothbrush. Check with your nurse before using dental floss.  . Be very careful when using knives or tools  . Use an electric shaver instead of a razor  . Ask your doctor or nurse about medicines that are available to help stop or lessen constipation, diarrhea and/or nausea.  . Drink plenty of fluids (a minimum of eight glasses per day is recommended).  . If you are not able to move your bowels, check with your doctor  or nurse before you use any enemas, laxatives, or suppositories  . To help with nausea and vomiting, eat small, frequent meals instead of three large meals a day. Choose foods and drinks that are at room temperature. Ask your nurse or doctor about other helpful tips and medicine that is available to help or stop lessen these symptoms.  . If you get diarrhea, eat low-fiber foods that are high in protein and calories and avoid foods that can irritate your digestive tracts or lead to cramping. Ask your nurse or doctor about medicine that can lessen or stop your diarrhea.  . To help with decreased appetite, eat small, frequent meals  . Eat high caloric food such as pudding, ice cream, yogurt and milkshakes.  . Manage tiredness by pacing your activities for the day. Be sure to include periods of rest between energy-draining activities  . Keeping your pain under control is important to your wellbeing. Please tell your doctor or nurse if you are experiencing pain.  . If you have  diabetes, keep good control of your blood sugar level. Tell your nurse or your doctor if your glucose levels are higher or lower than normal  . If you get a rash do not put anything on it unless your doctor or nurse says you may. Keep the area around the rash clean and dry. Ask your doctor for medicine if your rash bothers you.  . Infusion reactions may occur after your infusion. If this happens, call 911 for emergency care.  Food and Drug Interactions  . There are no known interactions of nivolumab with food or other medications.  . Tell your doctor and pharmacist about all the medicines and dietary supplements (vitamins, minerals, herbs and others) that you are taking at this time. The safety and use of dietary supplements and alternative diets are often not known. Using these might affect your cancer or interfere with your treatment. Until more is known, you should not use dietary supplements or alternative diets without  your cancer doctor's help.  When to Call the Doctor  Call your doctor or nurse if you have any of these symptoms and/or any new or unusual symptoms:  . Fever of 100.4 F (38 C) or higher  . Chills  . Easy bleeding or bruising  . Wheezing or trouble breathing  . Dry cough, or cough with yellow, green or bloody mucus  . Confusion and/or agitation  . Hallucinations  . Trouble understanding or speaking  . Blurry vision or changes in your eyesight  . Numbness or lack of strength to your arms, legs, face, or body  . Feeling dizzy or lightheaded  . Loose bowel movements (diarrhea) more than 4 times a day or diarrhea with weakness or lightheadedness  . Nausea that stops you from eating or drinking, and/or that is not relieved by prescribed medicines  . Lasting loss of appetite or rapid weight loss of five pounds in a week  . No bowel movement for 3 days or you feel uncomfortable  . Bad abdominal pain, especially in upper right area  . Fatigue or extreme weakness that interferes with normal activities  . Decreased urine  . Unusual thirst or passing urine often  . Rash that is not relieved by prescribed medicines  . Rash or itching  . Flu-like symptoms: fever, headache, muscle and joint aches, and fatigue (low energy, feeling weak)  . Signs of liver problems: dark urine, pale bowel movements, bad stomach pain, feeling very tired and weak, unusual itching, or yellowing of the eyes or skin.  . Signs of infusion reaction: fever or shaking chills, flushing, facial swelling, feeling dizzy, headache, trouble breathing, rash, itching, chest tightness, or chest pain.  . If you think you may be pregnant  Reproduction Warnings  . Pregnancy warning: This drug can have harmful effects on the unborn baby. Women of child bearing potential should use effective methods of birth control during your cancer treatment and for at least 5 months after treatment. Let your doctor know right  away if you think you may be pregnant.  . Breastfeeding warning: It is not known if this drug passes into breast milk. For this reason, women should not breast feed during treatment because this drug could enter the breast milk and cause harm to a breast feeding baby.  . Fertility warning: Human fertility studies have not been done with this drug. Talk with your doctor or nurse if you plan to have children. Ask for information on sperm or egg banking.   Oxaliplatin (Eloxatin)  About This Drug  Oxaliplatin is used to treat cancer. It is given in the vein (IV).  It takes two hours to infuse.  Possible Side Effects  . Bone marrow suppression. This is a decrease in the number of white blood cells, red blood cells, and platelets. This may raise your risk of infection, make you tired and weak (fatigue), and raise your risk of bleeding.  . Tiredness  . Soreness of the mouth and throat. You may have red areas, white patches, or sores that hurt.  . Nausea and vomiting (throwing up)  . Diarrhea (loose bowel movements)  . Changes in your liver function  . Effects on the nerves called peripheral neuropathy. You may feel numbness, tingling, or pain in your hands and feet, and may be worse in cold temperatures. It may be hard for you to button your clothes, open jars, or walk as usual. The effect on the nerves may get worse with more doses of the drug. These effects get better in some people after the drug is stopped but it does not get better in all people  Note: Each of the side effects above was reported in 40% or greater of patients treated with oxaliplatin. Not all possible side effects are included above.  Warnings and Precautions  . Allergic reactions, including anaphylaxis, which may be life-threatening are rare but may happen in some patients. Signs of allergic reaction to this drug may be swelling of the face, feeling like your tongue or throat are swelling, trouble breathing, rash,  itching, fever, chills, feeling dizzy, and/or feeling that your heart is beating in a fast or not normal way. If this happens, do not take another dose of this drug. You should get urgent medical treatment.  . Inflammation (swelling) of the lungs, which may be life-threatening. You may have a dry cough or trouble breathing.  . Effects on the nerves (neuropathy) may resolve within 14 days, or it may persist beyond 14 days.  . Severe decrease in white blood cells when combined with the chemotherapy agents 5-fluorouracil and leucovorin. This may be life-threatening.  . Severe changes in your liver function  . Abnormal heart beat and/or EKG, which can be life-threatening  . Rhabdomyolysis- damage to your muscles which may release proteins in your blood and affect how your kidneys work, which can be life-threatening. You may have severe muscle weakness and/or pain, or dark urine.  Important Information  . This drug may impair your ability to drive or use machinery. Talk to your doctor and/or nurse about precautions you may need to take.  . This drug may be present in the saliva, tears, sweat, urine, stool, vomit, semen, and vaginal secretions. Talk to your doctor and/or your nurse about the necessary precautions to take during this time.  . The effects on the nerves can be aggravated by exposure to cold. Avoid cold beverages, use of ice and make sure you cover your skin and dress warmly prior to being exposed to cold temperatures while you are receiving treatment with oxaliplatin.  Treating Side Effects  . Manage tiredness by pacing your activities for the day.  . Be sure to include periods of rest between energy-draining activities.  . To decrease the risk of infection, wash your hands regularly.  . Avoid close contact with people who have a cold, the flu, or other infections.   . Take your temperature as your doctor or nurse tells you, and whenever you feel like you may have a  fever.  Marland Kitchen  To help decrease the risk of bleeding, use a soft toothbrush. Check with your nurse before using dental floss.  . Be very careful when using knives or tools.  . Use an electric shaver instead of a razor.  . Drink plenty of fluids (a minimum of eight glasses per day is recommended).  . Mouth care is very important. Your mouth care should consist of routine, gentle cleaning of your teeth or dentures and rinsing your mouth with a mixture of 1/2 teaspoon of salt in 8 ounces of water or 1/2 teaspoon of baking soda in 8 ounces of water. This should be done at least after each meal and at bedtime.  . If you have mouth sores, avoid mouthwash that has alcohol. Also avoid alcohol and smoking because they can bother your mouth and throat.  . To help with nausea and vomiting, eat small, frequent meals instead of three large meals a day. Choose foods and drinks that are at room temperature. Ask your nurse or doctor about other helpful tips and medicine that is available to help stop or lessen these symptoms.  . If you throw up or have loose bowel movements, you should drink more fluids so that you do not become dehydrated (lack of water in the body from losing too much fluid).  . If you have diarrhea, eat low-fiber foods that are high in protein and calories and avoid foods that can irritate your digestive tracts or lead to cramping.  . Ask your nurse or doctor about medicine that can lessen or stop your diarrhea.  . If you have numbness and tingling in your hands and feet, be careful when cooking, walking, and handling sharp objects and hot liquids.  . Do not drink cold drinks or use ice in beverages. Drink fluids at room temperature or warmer, and drink through a straw.  . Wear gloves to touch cold objects, and wear warm clothing and cover you skin during cold weather.  Food and Drug Interactions  . There are no known interactions of oxaliplatin with food and other medications.  . This  drug may interact with other medicines. Tell your doctor and pharmacist about all the prescription and over-the-counter medicines and dietary supplements (vitamins, minerals, herbs and others) that you are taking at this time. Also, check with your doctor or pharmacist before starting any new prescription or over-the-counter medicines, or dietary supplements to make sure that there are no interactions  When to Call the Doctor  Call your doctor or nurse if you have any of these symptoms and/or any new or unusual symptoms:  . Fever of 100.4 F (38 C) or higher  . Chills  . Tiredness that interferes with your daily activities  . Feeling dizzy or lightheaded  . Easy bleeding or bruising  . Feeling that your heart is beating in a fast or not normal way (palpitations)  . Pain in your chest  . Dry cough  . Trouble breathing  . Pain in your mouth or throat that makes it hard to eat or drink  . Nausea that stops you from eating or drinking and/or is not relieved by prescribed medicines  . Vomiting  . Diarrhea, 4 times in one day or diarrhea with lack of strength or a feeling of being dizzy  . Numbness, tingling, or pain in your hands and feet  . Signs of possible liver problems: dark urine, pale bowel movements, bad stomach pain, feeling very tired and weak, unusual itching, or yellowing of the  eyes or skin  . Signs of rhabdomyolysis: decreased urine, very dark urine, muscle pain in the shoulders, thighs, or lower back; muscle weakness or trouble moving arms and legs  . Signs of allergic reaction: swelling of the face, feeling like your tongue or throat are swelling, trouble breathing, rash, itching, fever, chills, feeling dizzy, and/or feeling that your heart is beating in a fast or not normal way. If this happens, call 911 for emergency care.  . If you think you may be pregnant  Reproduction Warnings  . Pregnancy warning: This drug may have harmful effects on the unborn baby.  Women of childbearing potential should use effective methods of birth control during your cancer treatment. Let your doctor know right away if you think you may be pregnant or may have impregnated your partner.  . Breastfeeding warning: It is not known if this drug passes into breast milk. For this reason, women should talk to their doctor about the risks and benefits of breastfeeding during treatment with this drug because this drug may enter the breast milk and cause harm to a breastfeeding baby.  . Fertility warning: Human fertility studies have not been done with this drug. Talk with your doctor or nurse if you plan to have children. Ask for information on sperm or egg banking.   Leucovorin Calcium  About This Drug  Leucovorin is a vitamin. It is used in combination with other cancer fighting drugs such as 5-fluorouracil and methotrexate. Leucovorin is given in the vein (IV) or by mouth (orally).  Possible Side Effects . Rash and itching  Note: Leucovorin by itself has very few side effects. Other side effects you may have can be caused by the other drugs you are taking, such as 5-fluorouracil.  Warnings and Precautions  . Allergic reactions, including anaphylaxis are rare but may happen in some patients. Signs of allergic reaction to this drug may be swelling of the face, feeling like your tongue or throat are swelling, trouble breathing, rash, itching, fever, chills, feeling dizzy, and/or feeling that your heart is beating in a fast or not normal way. If this happens, do not take another dose of this drug. You should get urgent medical treatment.   Food and Drug Interactions  . There are no known interactions of leucovorin with food.  . This drug may interact with other medicines. Tell your doctor and pharmacist about all the prescription and over-the-counter medicines and dietary supplements (vitamins, minerals, herbs and others) that you are taking at this time.  . Also, check  with your doctor or pharmacist before starting any new prescription or over-the-counter medicines, or dietary supplements to make sure that there are no interactions.  When to Call the Doctor  Call your doctor or nurse if you have any of these symptoms and/or any new or unusual symptoms:  . A new rash or a rash that is not relieved by prescribed medicines  . Signs of allergic reaction: swelling of the face, feeling like your tongue or throat are swelling, trouble breathing, rash, itching, fever, chills, feeling dizzy, and/or feeling that your heart is beating in a fast or not normal way. If this happens, call 911 for emergency care.  . If you think you may be pregnant  Reproduction Warnings  . Pregnancy warning: It is not known if this drug may harm an unborn child. For this reason, be sure to talk with your doctor if you are pregnant or planning to become pregnant while receiving this drug. Let  your doctor know right away if you think you may be pregnant  . Breastfeeding warning: It is not known if this drug passes into breast milk. For this reason, women should talk to their doctor about the risks and benefits of breastfeeding during treatment with this drug because this drug may enter the breast milk and cause harm to a breastfeeding baby.  . Fertility warning: Human fertility studies have not been done with this drug. Talk with your doctor or nurse if you plan to have children. Ask for information on sperm or egg banking.   5-Fluorouracil (Adrucil; 5FU)  About This Drug  Fluorouracil is used to treat cancer. It is given in the vein (IV). It is given as an IV push from a syringe and also as a continuous infusion given via an ambulatory pump (a pump you take home and wear for a specified amount of time).  Possible Side Effects  . Bone marrow suppression. This is a decrease in the number of white blood cells, red blood cells, and platelets. This may raise your risk of infection, make  you tired and weak (fatigue), and raise your risk of bleeding  . Changes in the tissue of the heart and/or heart attack. Some changes may happen that can cause your heart to have less ability to pump blood.  . Blurred vision or other changes in eyesight  . Nausea and throwing up (vomiting)  . Diarrhea (loose bowel movements)  . Ulcers - sores that may cause pain or bleeding in your digestive tract, which includes your mouth, esophagus, stomach, small/large intestines and rectum  . Soreness of the mouth and throat. You may have red areas, white patches, or sores that hurt.  . Allergic reactions, including anaphylaxis are rare but may happen in some patients. Signs of allergic reaction to this drug may be swelling of the face, feeling like your tongue or throat are swelling, trouble breathing, rash, itching, fever, chills, feeling dizzy, and/or feeling that your heart is beating in a fast or not normal way. If this happens, do not take another dose of this drug. You should get urgent medical treatment.  . Sensitivity to light (photosensitivity). Photosensitivity means that you may become more sensitive to the sun and/or light. You may get a skin rash/reaction if you are in the sun or are exposed to sun lamps and tanning beds. Your eyes may water more, mostly in bright light.  . Changes in your nail color, nail loss and/or brittle nail  . Darkening of the skin, or changes to the color of your skin and/or veins used for infusion  . Rash, dry skin, or itching  Note: Not all possible side effects are included above.  Warnings and Precautions  . Hand-and-foot syndrome. The palms of your hands or soles of your feet may tingle, become numb, painful, swollen, or red.  . Changes in your central nervous system can happen. The central nervous system is made up of your brain and spinal cord. You could feel extreme tiredness, agitation, confusion, hallucinations (see or hear things that are not  there), trouble understanding or speaking, loss of control of your bowels or bladder, eyesight changes, numbness or lack of strength to your arms, legs, face, or body, or coma. If you start to have any of these symptoms let your doctor know right away.  . Side effects of this drug may be unexpectedly severe in some patients  Note: Some of the side effects above are very rare. If you  have concerns and/or questions, please discuss them with your medical team.  Important Information   . This drug may be present in the saliva, tears, sweat, urine, stool, vomit, semen, and vaginal secretions. Talk to your doctor and/or your nurse about the necessary precautions to take during this time.  Treating Side Effects  . Manage tiredness by pacing your activities for the day.  . Be sure to include periods of rest between energy-draining activities.  . To help decrease the risk of infections, wash your hands regularly.  . Avoid close contact with people who have a cold, the flu, or other infections.  . Take your temperature as your doctor or nurse tells you, and whenever you feel like you may have a fever.  . Use a soft toothbrush. Check with your nurse before using dental floss.  . Be very careful when using knives or tools.  . Use an electric shaver instead of a razor.  . If you have a nose bleed, sit with your head tipped slightly forward. Apply pressure by lightly pinching the bridge of your nose between your thumb and forefinger. Call your doctor if you feel dizzy or faint or if the bleeding doesn't stop after 10 to 15 minutes.  . Drink plenty of fluids (a minimum of eight glasses per day is recommended).  . If you throw up or have loose bowel movements, you should drink more fluids so that you do not become dehydrated (lack of water in the body from losing too much fluid).  . To help with nausea and vomiting, eat small, frequent meals instead of three large meals a day. Choose foods and  drinks that are at room temperature. Ask your nurse or doctor about other helpful tips and medicine that is available to help, stop, or lessen these symptoms.  . If you have diarrhea, eat low-fiber foods that are high in protein and calories and avoid foods that can irritate your digestive tracts or lead to cramping.  . Ask your nurse or doctor about medicine that can lessen or stop your diarrhea.  . Mouth care is very important. Your mouth care should consist of routine, gentle cleaning of your teeth or dentures and rinsing your mouth with a mixture of 1/2 teaspoon of salt in 8 ounces of water or 1/2 teaspoon of baking soda in 8 ounces of water. This should be done at least after each meal and at bedtime.  . If you have mouth sores, avoid mouthwash that has alcohol. Also avoid alcohol and smoking because they can bother your mouth and throat.  Marland Kitchen Keeping your nails moisturized may help with brittleness.  . To help with itching, moisturize your skin several times day.  . Use sunscreen with SPF 30 or higher when you are outdoors even for a short time. Cover up when you are out in the sun. Wear wide-brimmed hats, long-sleeved shirts, and pants. Keep your neck, chest, and back covered. Wear dark sun glasses when in the sun or bright lights.  . If you get a rash do not put anything on it unless your doctor or nurse says you may. Keep the area around the rash clean and dry. Ask your doctor for medicine if your rash bothers you.  Marland Kitchen Keeping your pain under control is important to your well-being. Please tell your doctor or nurse if you are experiencing pain.  Food and Drug Interactions  . There are no known interactions of fluorouracil with food.  . Check with your  doctor or pharmacist about all other prescription medicines and over-the-counter medicines and dietary supplements (vitamins, minerals, herbs and others) you are taking before starting this medicine as there are known drug interactions  with 5-fluoroucacil. Also, check with your doctor or pharmacist before starting any new prescription or over-the-counter medicines, or dietary supplements to make sure that there are no interactions.  When to Call the Doctor  Call your doctor or nurse if you have any of these symptoms and/or any new or unusual symptoms:  . Fever of 100.4 F (38 C) or higher  . Chills  . Easy bleeding or bruising  . Nose bleed that doesn't stop bleeding after 10-15 minutes  . Trouble breathing  . Feeling dizzy or lightheaded  . Feeling that your heart is beating in a fast or not normal way (palpitations)  . Chest pain or symptoms of a heart attack. Most heart attacks involve pain in the center of the chest that lasts more than a few minutes. The pain may go away and come back or it can be constant. It can feel like pressure, squeezing, fullness, or pain. Sometimes pain is felt in one or both arms, the back, neck, jaw, or stomach. If any of these symptoms last 2 minutes, call 911.  Marland Kitchen Confusion and/or agitation  . Hallucinations  . Trouble understanding or speaking  . Loss of control of bowels or bladder  . Blurry vision or changes in your eyesight  . Headache that does not go away  . Numbness or lack of strength to your arms, legs, face, or body  . Nausea that stops you from eating or drinking and/or is not relieved by prescribed medicines  . Throwing up more than 3 times a day  . Diarrhea, 4 times in one day or diarrhea with lack of strength or a feeling of being dizzy  . Pain in your mouth or throat that makes it hard to eat or drink  . Pain along the digestive tract - especially if worse after eating  . Blood in your vomit (bright red or coffee-ground) and/or stools (bright red, or black/tarry)  . Coughing up blood  . Tiredness that interferes with your daily activities  . Painful, red, or swollen areas on your hands or feet or around your nails  . A new rash or a rash that is  not relieved by prescribed medicines  . Develop sensitivity to sunlight/light  . Numbness and/or tingling of your hands and/or feet  . Signs of allergic reaction: swelling of the face, feeling like your tongue or throat are swelling, trouble breathing, rash, itching, fever, chills, feeling dizzy, and/or feeling that your heart is beating in a fast or not normal way. If this happens, call 911 for emergency care.  . If you think you are pregnant or may have impregnated your partner  Reproduction Warnings  . Pregnancy warning: This drug may have harmful effects on the unborn baby. Women of child bearing potential should use effective methods of birth control during your cancer treatment and 3 months after treatment. Men with male partners of childbearing potential should use effective methods of birth control during your cancer treatment and for 3 months after your cancer treatment. Let your doctor know right away if you think you may be pregnant or may have impregnated your partner.  . Breastfeeding warning: It is not known if this drug passes into breast milk. For this reason, Women should not breastfeed during treatment because this drug could enter the  breast milk and cause harm to a breastfeeding baby.  . Fertility warning: In men and women both, this drug may affect your ability to have children in the future. Talk with your doctor or nurse if you plan to have children. Ask for information on sperm or egg banking.   SELF CARE ACTIVITIES WHILE RECEIVING CHEMOTHERAPY:  Hydration Increase your fluid intake 48 hours prior to treatment and drink at least 8 to 12 cups (64 ounces) of water/decaffeinated beverages per day after treatment. You can still have your cup of coffee or soda but these beverages do not count as part of your 8 to 12 cups that you need to drink daily. No alcohol intake.  Medications Continue taking your normal prescription medication as prescribed.  If you start any new  herbal or new supplements please let us know first to make sure it is safe.  Mouth Care Have teeth cleaned professionally before starting treatment. Keep dentures and partial plates clean. Use soft toothbrush and do not use mouthwashes that contain alcohol. Biotene is a good mouthwash that is available at most pharmacies or may be ordered by calling [phone]. Use warm salt water gargles (1 teaspoon salt per 1 quart warm water) before and after meals and at bedtime. If you need dental work, please let the doctor know before you go for your appointment so that we can coordinate the best possible time for you in regards to your chemo regimen. You need to also let your dentist know that you are actively taking chemo. We may need to do labs prior to your dental appointment.  Skin Care Always use sunscreen that has not expired and with SPF (Sun Protection Factor) of 50 or higher. Wear hats to protect your head from the sun. Remember to use sunscreen on your hands, ears, face, & feet.  Use good moisturizing lotions such as udder cream, eucerin, or even Vaseline. Some chemotherapies can cause dry skin, color changes in your skin and nails.    . Avoid long, hot showers or baths. . Use gentle, fragrance-free soaps and laundry detergent. . Use moisturizers, preferably creams or ointments rather than lotions because the thicker consistency is better at preventing skin dehydration. Apply the cream or ointment within 15 minutes of showering. Reapply moisturizer at night, and moisturize your hands every time after you wash them.  Hair Loss (if your doctor says your hair will fall out)  . If your doctor says that your hair is likely to fall out, decide before you begin chemo whether you want to wear a wig. You may want to shop before treatment to match your hair color. . Hats, turbans, and scarves can also camouflage hair loss, although some people prefer to leave their heads uncovered. If you go bare-headed  outdoors, be sure to use sunscreen on your scalp. . Cut your hair short. It eases the inconvenience of shedding lots of hair, but it also can reduce the emotional impact of watching your hair fall out. . Don't perm or color your hair during chemotherapy. Those chemical treatments are already damaging to hair and can enhance hair loss. Once your chemo treatments are done and your hair has grown back, it's OK to resume dyeing or perming hair.  With chemotherapy, hair loss is almost always temporary. But when it grows back, it may be a different color or texture. In older adults who still had hair color before chemotherapy, the new growth may be completely gray.  Often, new hair is  very fine and soft.  Infection Prevention Please wash your hands for at least 30 seconds using warm soapy water. Handwashing is the #1 way to prevent the spread of germs. Stay away from sick people or people who are getting over a cold. If you develop respiratory systems such as green/yellow mucus production or productive cough or persistent cough let us know and we will see if you need an antibiotic. It is a good idea to keep a pair of gloves on when going into grocery stores/[employer] to decrease your risk of coming into contact with germs on the carts, etc. Carry alcohol hand gel with you at all times and use it frequently if out in public. If your temperature reaches 100.5 or higher please call the clinic and let us know.  If it is after hours or on the weekend please go to the ER if your temperature is over 100.5.  Please have your own personal thermometer at home to use.    Sex and bodily fluids If you are going to have sex, a condom must be used to protect the person that isn't taking chemotherapy. Chemo can decrease your libido (sex drive). For a few days after chemotherapy, chemotherapy can be excreted through your bodily fluids.  When using the toilet please close the lid and flush the toilet twice.  Do this for a few day  after you have had chemotherapy.   Effects of chemotherapy on your sex life Some changes are simple and won't last long. They won't affect your sex life permanently.  Sometimes you may feel: . too tired . not strong enough to be very active . sick or sore  . not in the mood . anxious or low  Your anxiety might not seem related to sex. For example, you may be worried about the cancer and how your treatment is going. Or you may be worried about money, or about how you family are coping with your illness.  These things can cause stress, which can affect your interest in sex. It's important to talk to your partner about how you feel.  Remember - the changes to your sex life don't usually last long. There's usually no medical reason to stop having sex during chemo. The drugs won't have any long term physical effects on your performance or enjoyment of sex. Cancer can't be passed on to your partner during sex  Contraception It's important to use reliable contraception during treatment. Avoid getting pregnant while you or your partner are having chemotherapy. This is because the drugs may harm the baby. Sometimes chemotherapy drugs can leave a man or woman infertile.  This means you would not be able to have children in the future. You might want to talk to someone about permanent infertility. It can be very difficult to learn that you may no longer be able to have children. Some people find counselling helpful. There might be ways to preserve your fertility, although this is easier for men than for women. You may want to speak to a fertility expert. You can talk about sperm banking or harvesting your eggs. You can also ask about other fertility options, such as donor eggs. If you have or have had breast cancer, your doctor might advise you not to take the contraceptive pill. This is because the hormones in it might affect the cancer. It is not known for sure whether or not chemotherapy drugs can be passed  on through semen or secretions from the vagina. Because of  this some doctors advise people to use a barrier method if you have sex during treatment. This applies to vaginal, anal or oral sex. Generally, doctors advise a barrier method only for the time you are actually having the treatment and for about a week after your treatment. Advice like this can be worrying, but this does not mean that you have to avoid being intimate with your partner. You can still have close contact with your partner and continue to enjoy sex.  Animals If you have cats or birds we just ask that you not change the litter or change the cage.  Please have someone else do this for you while you are on chemotherapy.   Food Safety During and After Cancer Treatment Food safety is important for people both during and after cancer treatment. Cancer and cancer treatments, such as chemotherapy, radiation therapy, and stem cell/bone marrow transplantation, often weaken the immune system. This makes it harder for your body to protect itself from foodborne illness, also called food poisoning. Foodborne illness is caused by eating food that contains harmful bacteria, parasites, or viruses.  Foods to avoid Some foods have a higher risk of becoming tainted with bacteria. These include: Marland Kitchen Unwashed fresh fruit and vegetables, especially leafy vegetables that can hide dirt and other contaminants . Raw sprouts, such as alfalfa sprouts . Raw or undercooked beef, especially ground beef, or other raw or undercooked meat and poultry . Fatty, fried, or spicy foods immediately before or after treatment.  These can sit heavy on your stomach and make you feel nauseous. . Raw or undercooked shellfish, such as oysters. . Sushi and sashimi, which often contain raw fish.  . Unpasteurized beverages, such as unpasteurized fruit juices, raw milk, raw yogurt, or cider . Undercooked eggs, such as soft boiled, over easy, and poached; raw, unpasteurized eggs; or  foods made with raw egg, such as homemade raw cookie dough and homemade mayonnaise  Simple steps for food safety  Shop smart. . Do not buy food stored or displayed in an unclean area. . Do not buy bruised or damaged fruits or vegetables. . Do not buy cans that have cracks, dents, or bulges. . Pick up foods that can spoil at the end of your shopping trip and store them in a cooler on the way home.  Prepare and clean up foods carefully. . Rinse all fresh fruits and vegetables under running water, and dry them with a clean towel or paper towel. . Clean the top of cans before opening them. . After preparing food, wash your hands for 20 seconds with hot water and soap. Pay special attention to areas between fingers and under nails. . Clean your utensils and dishes with hot water and soap. Marland Kitchen Disinfect your kitchen and cutting boards using 1 teaspoon of liquid, unscented bleach mixed into 1 quart of water.    Dispose of old food. . Eat canned and packaged food before its expiration date (the "use by" or "best before" date). . Consume refrigerated leftovers within 3 to 4 days. After that time, throw out the food. Even if the food does not smell or look spoiled, it still may be unsafe. Some bacteria, such as Listeria, can grow even on foods stored in the refrigerator if they are kept for too long.  Take precautions when eating out. . At restaurants, avoid buffets and salad bars where food sits out for a long time and comes in contact with many people. Food can become contaminated when someone  with a virus, often a norovirus, or another "bug" handles it. . Put any leftover food in a "to-go" container yourself, rather than having the server do it. And, refrigerate leftovers as soon as you get home. . Choose restaurants that are clean and that are willing to prepare your food as you order it cooked.   AT HOME MEDICATIONS:                                                                                                                                                                 Compazine/Prochlorperazine 10mg  tablet. Take 1 tablet every 6 hours as needed for nausea/vomiting. (This can make you sleepy)   EMLA cream. Apply a quarter size amount to port site 1 hour prior to chemo. Do not rub in. Cover with plastic wrap.    Diarrhea Sheet   If you are having loose stools/diarrhea, please purchase Imodium and begin taking as outlined:  At the first sign of poorly formed or loose stools you should begin taking Imodium (loperamide) 2 mg capsules. Take two tablets (4mg ) followed by one tablet (2mg ) every 2 hours - DO NOT EXCEED 8 tablets in 24 hours.  If it is bedtime and you are having loose stools, take 2 tablets at bedtime, then 2 tablets every 4 hours until morning.   Always call the [facility] if you are having loose stools/diarrhea that you can't get under control.  Loose stools/diarrhea leads to dehydration (loss of water) in your body.  We have other options of trying to get the loose stools/diarrhea to stop but you must let us know!   Constipation Sheet  Colace - 100 mg capsules - take 2 capsules daily.  If this doesn't help then you can increase to 2 capsules twice daily.  Please call if the above does not work for you. Do not go more than 2 days without a bowel movement.  It is very important that you do not become constipated.  It will make you feel sick to your stomach (nausea) and can cause abdominal pain and vomiting.  Nausea Sheet   Compazine/Prochlorperazine 10mg  tablet. Take 1 tablet every 6 hours as needed for nausea/vomiting (This can make you drowsy).  If you are having persistent nausea (nausea that does not stop) please call the [facility] and let us know the amount of nausea that you are experiencing.  If you begin to vomit, you need to call the [facility] and if it is the weekend and you have vomited more than one time and can't get it to stop-go to the Emergency  Room.  Persistent nausea/vomiting can lead to dehydration (loss of fluid in your body) and will make you feel very weak and unwell. Ice chips, sips of clear liquids, foods  that are at room temperature, crackers, and toast tend to be better tolerated.   SYMPTOMS TO REPORT AS SOON AS POSSIBLE AFTER TREATMENT:  FEVER GREATER THAN 100.5 F  CHILLS WITH OR WITHOUT FEVER  NAUSEA AND VOMITING THAT IS NOT CONTROLLED WITH YOUR NAUSEA MEDICATION  UNUSUAL SHORTNESS OF BREATH  UNUSUAL BRUISING OR BLEEDING  TENDERNESS IN MOUTH AND THROAT WITH OR WITHOUT   PRESENCE OF ULCERS  URINARY PROBLEMS  BOWEL PROBLEMS  UNUSUAL RASH     Wear comfortable clothing and clothing appropriate for easy access to any Portacath or PICC line. Let us know if there is anything that we can do to make your therapy better!   What to do if you need assistance after hours or on the weekends: CALL [phone].  HOLD on the line, do not hang up.  You will hear multiple messages but at the end you will be connected with a nurse triage line.  They will contact the doctor if necessary.  Most of the time they will be able to assist you.  Do not call the hospital operator.     I have been informed and understand all of the instructions given to me and have received a copy. I have been instructed to call the clinic [phone] or my family physician as soon as possible for continued medical care, if indicated. I do not have any more questions at this time but understand that I may call the [facility] or the Patient Navigator at [phone] during office hours should I have questions or need assistance in obtaining follow-up care.

## 2019-12-30 NOTE — Progress Notes (Signed)
Timothy Oconnor Initial Psychosocial Assessment Clinical Social Work  Clinical Social Work contacted by phone to assess psychosocial, emotional, mental health, and spiritual needs of the patient.   Barriers to care/review of distress screen:  - Transportation:  Do you anticipate any problems getting to appointments?  Do you have someone who can help run errands for you if you need it? Wife is retired as of 12/1 - "we are both retired, so it should not be a problem at all."   - Help at home:  What is your living situation (alone, family, other)?  If you are physically unable to care for yourself, who would you call on to help you?  Lives with wife.  Wife is physically able to care for him.   - Support system:  What does your support system look like?  Who would you call on if you needed some kind of practical help?  What if you needed someone to talk to for emotional support?  Has wife, daughter/son in Sports coach in [place], and son/daughter in Sports coach who live in [place].  Also has had "outpouring of support from family,.friends, coworkers, golf buddies, church.  Feels "overwhelmed" with cards/calls he has had.   - Finances:  Are you concerned about finances.  Considering returning to work?  If not, applying for disability? Works part time, has Financial planner", was in social work for 32 years, worked at [employer] for 30 years.  Retired in 2006.  He and wife are both retired, no income concerns.    What is your understanding of where you are with your cancer? Its cause?  Your treatment plan and what happens next? "I was surprised by initial diagnosis."  Had assumed that he was "just going to have esophagus stretched, I had no idea there was a tumor there."  Had started having trouble swallowing 10/2019 - began when taking pills.  Eventually went to ED for evaluation when concerns w swallowing became more and more acute.  Starts chemotherapy tomorrow, aware that he will receive 3 drugs.  Aware that he will go home w chemo pump and  return after two days for pump stop.   Will do this every two weeks for 6 treatments, then repeat scan and "see where we are at."  What are your worries for the future as you begin treatment for cancer?  Impact on wife/children.  "They will go through it with me."  What are your hopes and priorities during your treatment? What is important to you? What are your goals for your care?  Just found out yesterday it is "stage IV and extensive, I was not anticipating hearing that, it was a bit of a blow."  "I hate putting my wife and children through it."  "Its not something that's foreign to [place] - wife had kidney cancer 8 years ago."  Brother survived colon cancer.  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  24 stents.  "I always thought it would be my heart, never thought it would be cancer."  COVID also has limited his social interactions -   What are you willing to sacrifice during your treatment?  Has had to limit his social interactions due to [place], fears that chemo will mean that he needs to limit this interactions even more.  Was working part time at [employer] since retirement.  Will probably need to stop doing that. "I miss the  personal contact."    What are you NOT willing to sacrifice during your treatment?  Needs to prioritize ways to safely interact with others despite COVID restrictions and challenges associated with living w feeding tube and port.     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 Will start chemotherapy soon.  Lives w wife, has children and grandchildren living locally. Has been retired from [employer] for over a decade, was Adult Art gallery manager.  Wife is also recently retired.  Diagnosis has led to understandable increase in stress, and decrease in available  coping mechanisms like part time work, golf, meals with friends and so on.  Has had cardiology incident which was determined recently to be "stress related."  Discussed stress management coping skills - he enjoys reading, spending time w wife and grandchildren, board games.  Encouraged to prioritize positive approach to stress management.  Prefers socially oriented stress management over mindfulness or other mind/body techniques.  Has positive resilient attitude towards challenge of coping w cancer diagnosis and treatment.  Significant support from family and friends.    Identifications of barriers to care:  None noted.  Availability of community resources:  Provided information on [place] and [employer] resources.    Clinical Social Worker follow up needed: No.  Please reconsult if needed.    Edwyna Shell, LCSW Clinical Social Worker Phone:  [phone] Cell:  [phone]

## 2019-12-30 NOTE — Telephone Encounter (Signed)
The nurse Arbie Cookey from [facility] called and wanted to know if Dr. Delton Coombes would be the doctor to sign pt's home health orders. Called Arbie Cookey back to let her know Dr. Raliegh Ip would be the provider that signs pt's [place] orders. The nurse verbalized understanding.

## 2019-12-31 ENCOUNTER — Other Ambulatory Visit: Payer: Self-pay

## 2019-12-31 ENCOUNTER — Telehealth (HOSPITAL_COMMUNITY): Payer: Self-pay

## 2019-12-31 ENCOUNTER — Ambulatory Visit (INDEPENDENT_AMBULATORY_CARE_PROVIDER_SITE_OTHER): Payer: Medicare HMO | Admitting: Gastroenterology

## 2019-12-31 ENCOUNTER — Inpatient Hospital Stay (HOSPITAL_COMMUNITY): Payer: Medicare HMO

## 2019-12-31 VITALS — BP 116/74 | HR 60 | Temp 96.8°F | Resp 18 | Wt 205.6 lb

## 2019-12-31 DIAGNOSIS — Z95828 Presence of other vascular implants and grafts: Secondary | ICD-10-CM

## 2019-12-31 DIAGNOSIS — C160 Malignant neoplasm of cardia: Secondary | ICD-10-CM

## 2019-12-31 DIAGNOSIS — C155 Malignant neoplasm of lower third of esophagus: Secondary | ICD-10-CM | POA: Diagnosis not present

## 2019-12-31 LAB — CBC WITH DIFFERENTIAL/PLATELET
Abs Immature Granulocytes: 0.02 10*3/uL (ref 0.00–0.07)
Basophils Absolute: 0 10*3/uL (ref 0.0–0.1)
Basophils Relative: 1 %
Eosinophils Absolute: 0.2 10*3/uL (ref 0.0–0.5)
Eosinophils Relative: 3 %
HCT: 40.2 % (ref 39.0–52.0)
Hemoglobin: 13 g/dL (ref 13.0–17.0)
Immature Granulocytes: 0 %
Lymphocytes Relative: 15 %
Lymphs Abs: 0.8 10*3/uL (ref 0.7–4.0)
MCH: 31.2 pg (ref 26.0–34.0)
MCHC: 32.3 g/dL (ref 30.0–36.0)
MCV: 96.4 fL (ref 80.0–100.0)
Monocytes Absolute: 0.4 10*3/uL (ref 0.1–1.0)
Monocytes Relative: 8 %
Neutro Abs: 3.9 10*3/uL (ref 1.7–7.7)
Neutrophils Relative %: 73 %
Platelets: 239 10*3/uL (ref 150–400)
RBC: 4.17 MIL/uL — ABNORMAL LOW (ref 4.22–5.81)
RDW: 12.8 % (ref 11.5–15.5)
WBC: 5.4 10*3/uL (ref 4.0–10.5)
nRBC: 0 % (ref 0.0–0.2)

## 2019-12-31 LAB — COMPREHENSIVE METABOLIC PANEL
ALT: 36 U/L (ref 0–44)
AST: 24 U/L (ref 15–41)
Albumin: 3.6 g/dL (ref 3.5–5.0)
Alkaline Phosphatase: 44 U/L (ref 38–126)
Anion gap: 9 (ref 5–15)
BUN: 15 mg/dL (ref 8–23)
CO2: 28 mmol/L (ref 22–32)
Calcium: 9.4 mg/dL (ref 8.9–10.3)
Chloride: 100 mmol/L (ref 98–111)
Creatinine, Ser: 0.74 mg/dL (ref 0.61–1.24)
GFR calc Af Amer: >60 mL/min (ref >60)
GFR calc non Af Amer: >60 mL/min (ref >60)
Glucose, Bld: 242 mg/dL — ABNORMAL HIGH (ref 70–99)
Potassium: 4.4 mmol/L (ref 3.5–5.1)
Sodium: 137 mmol/L (ref 135–145)
Total Bilirubin: 0.7 mg/dL (ref 0.3–1.2)
Total Protein: 6.8 g/dL (ref 6.5–8.1)

## 2019-12-31 LAB — MAGNESIUM: Magnesium: 2 mg/dL (ref 1.7–2.4)

## 2019-12-31 LAB — PHOSPHORUS: Phosphorus: 3.5 mg/dL (ref 2.5–4.6)

## 2019-12-31 LAB — TSH: TSH: 2.608 u[IU]/mL (ref 0.350–4.500)

## 2019-12-31 MED ORDER — SODIUM CHLORIDE 0.9 % IV SOLN
2310.0000 mg/m2 | INTRAVENOUS | Status: DC
  Administered 2019-12-31: 5000 mg via INTRAVENOUS
  Filled 2019-12-31: qty 100

## 2019-12-31 MED ORDER — SODIUM CHLORIDE 0.9 % IV SOLN
240.0000 mg | Freq: Once | INTRAVENOUS | Status: AC
  Administered 2019-12-31: 240 mg via INTRAVENOUS
  Filled 2019-12-31: qty 24

## 2019-12-31 MED ORDER — FLUOROURACIL CHEMO INJECTION 2.5 GM/50ML
400.0000 mg/m2 | Freq: Once | INTRAVENOUS | Status: AC
  Administered 2019-12-31: 850 mg via INTRAVENOUS
  Filled 2019-12-31: qty 17

## 2019-12-31 MED ORDER — LEUCOVORIN CALCIUM INJECTION 350 MG
400.0000 mg/m2 | Freq: Once | INTRAVENOUS | Status: AC
  Administered 2019-12-31: 868 mg via INTRAVENOUS
  Filled 2019-12-31: qty 33.4

## 2019-12-31 MED ORDER — SODIUM CHLORIDE 0.9 % IV SOLN: Freq: Once | INTRAVENOUS | Status: AC

## 2019-12-31 MED ORDER — DEXTROSE 5 % IV SOLN: Freq: Once | INTRAVENOUS | Status: AC

## 2019-12-31 MED ORDER — OXALIPLATIN CHEMO INJECTION 100 MG/20ML
68.0000 mg/m2 | Freq: Once | INTRAVENOUS | Status: AC
  Administered 2019-12-31: 12:00:00 150 mg via INTRAVENOUS
  Filled 2019-12-31: qty 20

## 2019-12-31 MED ORDER — PALONOSETRON HCL INJECTION 0.25 MG/5ML
0.2500 mg | Freq: Once | INTRAVENOUS | Status: AC
  Administered 2019-12-31: 0.25 mg via INTRAVENOUS
  Filled 2019-12-31: qty 5

## 2019-12-31 MED ORDER — SODIUM CHLORIDE 0.9 % IV SOLN
10.0000 mg | Freq: Once | INTRAVENOUS | Status: AC
  Administered 2019-12-31: 10 mg via INTRAVENOUS
  Filled 2019-12-31: qty 10

## 2019-12-31 NOTE — Progress Notes (Signed)
Patient presents today for D1 Cycle 1 Folfox/ Opdivo infusion. Vital signs within parameters for treatment. Labs pending. Jerene Canny RN at the bedside. Patient teaching performed. Patient has no complaints of any pain today. Patient denies any changes since his last visit with Dr. Delton Coombes.   Per Lendell Caprice / Financial Counselor Opdivo and Folfox have been authorized.    Labs reviewed by Dr. Delton Coombes. Proceed with treatment verbal order received. Teaching and consent obtained by [place] RN.  Authorization noted per [facility].   Treatment given today per MD orders. Tolerated infusion without adverse affects. 5FU pump infusion per protocol. RUN noted on the screen. Verified with patient. Vital signs stable. No complaints at this time. Discharged from clinic ambulatory. F/U with [facility] as scheduled.

## 2019-12-31 NOTE — Patient Instructions (Signed)
[facility] Discharge Instructions for Patients Receiving Chemotherapy  Today you received the following chemotherapy agents   To help prevent nausea and vomiting after your treatment, we encourage you to take your nausea medication   If you develop nausea and vomiting that is not controlled by your nausea medication, call the clinic.   BELOW ARE SYMPTOMS THAT SHOULD BE REPORTED IMMEDIATELY:  *FEVER GREATER THAN 100.5 F  *CHILLS WITH OR WITHOUT FEVER  NAUSEA AND VOMITING THAT IS NOT CONTROLLED WITH YOUR NAUSEA MEDICATION  *UNUSUAL SHORTNESS OF BREATH  *UNUSUAL BRUISING OR BLEEDING  TENDERNESS IN MOUTH AND THROAT WITH OR WITHOUT PRESENCE OF ULCERS  *URINARY PROBLEMS  *BOWEL PROBLEMS  UNUSUAL RASH Items with * indicate a potential emergency and should be followed up as soon as possible.  Feel free to call the clinic should you have any questions or concerns. The clinic phone number is [phone].  Please show the CHEMO ALERT CARD at check-in to the Emergency Department and triage nurse.   

## 2019-12-31 NOTE — Progress Notes (Signed)
Chemotherapy/Immunotherapy education packet given and discussed with pt in detail.  Discussed diagnosis and staging, tx regimen, and intent of tx.  Reviewed chemotherapy medications and side effects, as well as pre-medications.  Instructed on how to manage side effects at home, and when to call the clinic.  Importance of fever/chills discussed with pt. Discussed precautions to implement at home after receiving tx, as well as self care strategies. Phone numbers provided for clinic during regular working hours, also how to reach the clinic after hours and on weekends. Patient was provided the opportunity to ask questions - all questions answered to his satisfaction.

## 2019-12-31 NOTE — Telephone Encounter (Signed)
Nutrition  Called patient for nutrition follow-up.  RD at different location today.  Chart reviewed and noted first infusion of folfox and opdivo.    No answer.  Left voicemail with contact information.  RD will plan to see patient on Friday 1/29 while in clinic for pump removal.    Chellie Vanlue B. Zenia Resides, [place], [place] Registered Dietitian [phone] (pager)

## 2020-01-01 ENCOUNTER — Other Ambulatory Visit (HOSPITAL_COMMUNITY): Payer: Medicare HMO

## 2020-01-01 ENCOUNTER — Inpatient Hospital Stay (HOSPITAL_COMMUNITY): Payer: Medicare HMO

## 2020-01-02 ENCOUNTER — Inpatient Hospital Stay (HOSPITAL_COMMUNITY): Payer: Medicare HMO

## 2020-01-02 ENCOUNTER — Other Ambulatory Visit: Payer: Self-pay

## 2020-01-02 ENCOUNTER — Encounter (HOSPITAL_COMMUNITY): Payer: Self-pay

## 2020-01-02 VITALS — BP 98/50 | HR 65 | Temp 98.6°F | Resp 18

## 2020-01-02 DIAGNOSIS — C155 Malignant neoplasm of lower third of esophagus: Secondary | ICD-10-CM | POA: Diagnosis not present

## 2020-01-02 DIAGNOSIS — C160 Malignant neoplasm of cardia: Secondary | ICD-10-CM

## 2020-01-02 DIAGNOSIS — Z95828 Presence of other vascular implants and grafts: Secondary | ICD-10-CM

## 2020-01-02 MED ORDER — SODIUM CHLORIDE 0.9% FLUSH
10.0000 mL | INTRAVENOUS | Status: DC | PRN
  Administered 2020-01-02: 10 mL

## 2020-01-02 MED ORDER — HEPARIN SOD (PORK) LOCK FLUSH 100 UNIT/ML IV SOLN
500.0000 [IU] | Freq: Once | INTRAVENOUS | Status: AC | PRN
  Administered 2020-01-02: 500 [IU]

## 2020-01-02 NOTE — Progress Notes (Signed)
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  Patient receiving folfox chemotherapy.   Met with patient today following pump removal.  Patient reports that he has been tolerating 8 cartons of osmolite 1.5 via PEG tube (Giving 2 carton QID).  Flushing with 60ml of water before and after and sometimes gives more.  Has been tolerating without difficulty.  Reports normal bowel movement this am.  Recently has been having bowel movement about every other day.  No issues with nausea.    Drinking about 3 oral nutrition supplements daily.  Usually always has Boost VHC (530 calories, 22 g protein), ensure clear (180 calorie, 9 g protein)  and ensure enlive (350 calories, 20 g protein) daily.  Patient has also been drinking water and other fluids (broth) by mouth.    Reports blood glucose has been elevated.   Received steriods during infusion.    Medications: Vit C 2 times per week, folic acid, metformin, MVI, compazine   Labs: glucose 242 on 1/27, phosphorus 3.5 WNL and Mag 2.0 WNL  Anthropometrics:   Weight 205 lb 9.6 on 1/27    Estimated Energy Needs  Kcals: 2100-2375 Protein: 112-150 g Fluid: > 2 L  NUTRITION DIAGNOSIS: Inadequate oral intake continues   INTERVENTION:  Patient will monitor weight and oral intake and give 6 cartons of osmolite 1.5 via PEG tube.  Flush with 60ml water before and after. If oral intake drops will increase to 8 cartons of osmolite 1.5   Patient will continue to drink oral nutrition supplement shakes for additional ~1060 calories and 51 g protein. Encouraged patient to drink additional 240ml TID or place via feeding tube for better hydration.   Total calories with 6 cartons of osmolite and oral supplements 3190 calories, 140 g protein, 2885ml free water.   Patient has received adequate supply of enteral products from [employer] [facility] and has received support from home health nurse regarding tube care.   Patient has contact  information    MONITORING, EVALUATION, GOAL: Patient will utilize feeding tube and oral intake to receive adequate calories and protein to preserve lean muscle mass.    NEXT VISIT: Feb 12th at pump removal  Joli B. Allen, RD, LDN Registered Dietitian [phone] (pager)     

## 2020-01-02 NOTE — Patient Instructions (Signed)
[facility] Discharge Instructions  5FU pump discontinued with portacath flushed per protocol. Follow-up as scheduled. Call clinic for any questions or concerns   Thank you for choosing [facility] at [facility] to provide your oncology and hematology care.  To afford each patient quality time with our provider, please arrive at least 15 minutes before your scheduled appointment time.   If you have a lab appointment with the Cancer Center please come in thru the Main Entrance and check in at the main information desk.  You need to re-schedule your appointment should you arrive 10 or more minutes late.  We strive to give you quality time with our providers, and arriving late affects you and other patients whose appointments are after yours.  Also, if you no show three or more times for appointments you may be dismissed from the clinic at the providers discretion.     Again, thank you for choosing [facility].  Our hope is that these requests will decrease the amount of time that you wait before being seen by our physicians.       _____________________________________________________________  Should you have questions after your visit to [facility], please contact our office at [phone] between the hours of 8:00 a.m. and 4:30 p.m.  Voicemails left after 4:00 p.m. will not be returned until the following business day.  For prescription refill requests, have your pharmacy contact our office and allow 72 hours.    Due to Covid, you will need to wear a mask upon entering the hospital. If you do not have a mask, a mask will be given to you at the Main Entrance upon arrival. For doctor visits, patients may have 1 support person with them. For treatment visits, patients can not have anyone with them due to social distancing guidelines and our immunocompromised population.     

## 2020-01-02 NOTE — Progress Notes (Signed)
Timothy Oconnor tolerated 5FU pump well without complaints or incident. 5FU pump discontinued with portacath flushed easily per protocol then de-accessed. VSS Pt meet with dietician for F/U consult and then was discharged self ambulatory in satisfactory condition

## 2020-01-05 ENCOUNTER — Ambulatory Visit (HOSPITAL_COMMUNITY): Payer: Medicare HMO

## 2020-01-05 ENCOUNTER — Other Ambulatory Visit (HOSPITAL_COMMUNITY): Payer: Medicare HMO

## 2020-01-05 NOTE — Progress Notes (Signed)
24 hour call back. Patient states he experienced two episodes of nausea and took his medication and the nausea resolved. Appetite is unaffected.   Patient states after the pump removal on Friday he experienced one episode of diarrhea and took his Imodium and it resolved. Cold sensitivity is minimal per patients words.

## 2020-01-07 ENCOUNTER — Inpatient Hospital Stay (HOSPITAL_COMMUNITY): Payer: Medicare HMO | Admitting: Hematology

## 2020-01-07 ENCOUNTER — Encounter (HOSPITAL_COMMUNITY): Payer: Self-pay | Admitting: Hematology

## 2020-01-07 ENCOUNTER — Encounter (HOSPITAL_COMMUNITY): Payer: Medicare HMO

## 2020-01-07 ENCOUNTER — Other Ambulatory Visit: Payer: Self-pay

## 2020-01-07 ENCOUNTER — Inpatient Hospital Stay (HOSPITAL_COMMUNITY): Payer: Medicare HMO | Attending: Hematology

## 2020-01-07 DIAGNOSIS — C160 Malignant neoplasm of cardia: Secondary | ICD-10-CM

## 2020-01-07 DIAGNOSIS — Z7982 Long term (current) use of aspirin: Secondary | ICD-10-CM | POA: Insufficient documentation

## 2020-01-07 DIAGNOSIS — Z5111 Encounter for antineoplastic chemotherapy: Secondary | ICD-10-CM | POA: Insufficient documentation

## 2020-01-07 DIAGNOSIS — R634 Abnormal weight loss: Secondary | ICD-10-CM | POA: Insufficient documentation

## 2020-01-07 DIAGNOSIS — I252 Old myocardial infarction: Secondary | ICD-10-CM | POA: Diagnosis not present

## 2020-01-07 DIAGNOSIS — Z79899 Other long term (current) drug therapy: Secondary | ICD-10-CM | POA: Diagnosis not present

## 2020-01-07 DIAGNOSIS — Z87891 Personal history of nicotine dependence: Secondary | ICD-10-CM | POA: Diagnosis not present

## 2020-01-07 DIAGNOSIS — C7951 Secondary malignant neoplasm of bone: Secondary | ICD-10-CM | POA: Insufficient documentation

## 2020-01-07 DIAGNOSIS — F419 Anxiety disorder, unspecified: Secondary | ICD-10-CM | POA: Insufficient documentation

## 2020-01-07 DIAGNOSIS — R1310 Dysphagia, unspecified: Secondary | ICD-10-CM | POA: Insufficient documentation

## 2020-01-07 LAB — CBC WITH DIFFERENTIAL/PLATELET
Abs Immature Granulocytes: 0.01 10*3/uL (ref 0.00–0.07)
Basophils Absolute: 0 10*3/uL (ref 0.0–0.1)
Basophils Relative: 1 %
Eosinophils Absolute: 0.3 10*3/uL (ref 0.0–0.5)
Eosinophils Relative: 5 %
HCT: 40.3 % (ref 39.0–52.0)
Hemoglobin: 13.4 g/dL (ref 13.0–17.0)
Immature Granulocytes: 0 %
Lymphocytes Relative: 16 %
Lymphs Abs: 0.8 10*3/uL (ref 0.7–4.0)
MCH: 31.7 pg (ref 26.0–34.0)
MCHC: 33.3 g/dL (ref 30.0–36.0)
MCV: 95.3 fL (ref 80.0–100.0)
Monocytes Absolute: 0.4 10*3/uL (ref 0.1–1.0)
Monocytes Relative: 9 %
Neutro Abs: 3.3 10*3/uL (ref 1.7–7.7)
Neutrophils Relative %: 69 %
Platelets: 231 10*3/uL (ref 150–400)
RBC: 4.23 MIL/uL (ref 4.22–5.81)
RDW: 12.3 % (ref 11.5–15.5)
WBC: 4.8 10*3/uL (ref 4.0–10.5)
nRBC: 0 % (ref 0.0–0.2)

## 2020-01-07 LAB — COMPREHENSIVE METABOLIC PANEL
ALT: 28 U/L (ref 0–44)
AST: 17 U/L (ref 15–41)
Albumin: 3.6 g/dL (ref 3.5–5.0)
Alkaline Phosphatase: 47 U/L (ref 38–126)
Anion gap: 8 (ref 5–15)
BUN: 22 mg/dL (ref 8–23)
CO2: 30 mmol/L (ref 22–32)
Calcium: 9 mg/dL (ref 8.9–10.3)
Chloride: 96 mmol/L — ABNORMAL LOW (ref 98–111)
Creatinine, Ser: 0.74 mg/dL (ref 0.61–1.24)
GFR calc Af Amer: >60 mL/min (ref >60)
GFR calc non Af Amer: >60 mL/min (ref >60)
Glucose, Bld: 292 mg/dL — ABNORMAL HIGH (ref 70–99)
Potassium: 4.6 mmol/L (ref 3.5–5.1)
Sodium: 134 mmol/L — ABNORMAL LOW (ref 135–145)
Total Bilirubin: 0.6 mg/dL (ref 0.3–1.2)
Total Protein: 6.3 g/dL — ABNORMAL LOW (ref 6.5–8.1)

## 2020-01-07 LAB — MAGNESIUM: Magnesium: 2 mg/dL (ref 1.7–2.4)

## 2020-01-07 LAB — PHOSPHORUS: Phosphorus: 3 mg/dL (ref 2.5–4.6)

## 2020-01-07 LAB — TSH: TSH: 2.483 u[IU]/mL (ref 0.350–4.500)

## 2020-01-07 NOTE — Progress Notes (Signed)
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  CURRENT THERAPY: FOLFOX and Opdivo.  BRIEF ONCOLOGIC HISTORY:  Oncology History  Esophageal cancer [facility])   Initial Diagnosis   Esophageal cancer ([place])   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[place])  12/24/2019 Initial Diagnosis   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[place])   12/29/2019 Cancer Staging   Staging form: Esophagus - Adenocarcinoma, AJCC 8th Edition - Clinical stage from 12/29/2019: Stage IVB (cTX, cN3, cM1) - Signed by Derek Jack, MD on 12/29/2019   12/31/2019 -  Chemotherapy   The patient had palonosetron (ALOXI) injection 0.25 mg, 0.25 mg, Intravenous,  Once, 1 of 6 cycles Administration: 0.25 mg (12/31/2019) leucovorin 868 mg in dextrose 5 % 250 mL infusion, 400 mg/m2 = 868 mg, Intravenous,  Once, 1 of 6 cycles Administration: 868 mg (12/31/2019) oxaliplatin (ELOXATIN) 150 mg in dextrose 5 % 500 mL chemo infusion, 68 mg/m2 = 150 mg (80 % of original dose 85 mg/m2), Intravenous,  Once, 1 of 6 cycles Dose modification: 68 mg/m2 (80 % of original dose 85 mg/m2, Cycle 1, Reason: Other (see comments), Comment: diabetic neuropathy) Administration: 150 mg (12/31/2019) fluorouracil (ADRUCIL) chemo injection 850 mg, 400 mg/m2 = 850 mg, Intravenous,  Once, 1 of 6 cycles Administration: 850 mg (12/31/2019) fluorouracil (ADRUCIL) 5,000 mg in sodium chloride 0.9 % 150 mL chemo infusion, 2,310 mg/m2 = 5,200 mg, Intravenous, 1 Day/Dose, 1 of 6 cycles Administration: 5,000 mg (12/31/2019) nivolumab (OPDIVO) 240 mg in sodium chloride 0.9 % 100 mL chemo infusion, 240 mg (100 % of original dose 240 mg), Intravenous, Once, 1 of 6 cycles Dose modification: 240 mg (original dose 240 mg, Cycle 1, Reason: Provider Judgment)  Administration: 240 mg (12/31/2019)  for chemotherapy treatment.       CANCER STAGING: Cancer Staging Adenocarcinoma of gastroesophageal junction [facility]) Staging form: Esophagus - Adenocarcinoma, AJCC 8th Edition - Clinical stage from 12/29/2019: Stage IVB (cTX, cN3, cM1) - Signed by Derek Jack, MD on 12/29/2019    INTERVAL HISTORY:  Mr. Plitt 69 y.o. male seen for follow-up of GE junction adenocarcinoma.  He has received cycle 1 of chemo immunotherapy on 12/31/2019.  He had nausea and Compazine helped.  Denied any vomiting.  He felt slightly tired during the first few days.  He had loose stools last _0 93  . BIOPSY  12/01/2019   Procedure: BIOPSY;  Surgeon: Rogene Houston, MD;  Location: [facility] ENDO [facility];  Service: Endoscopy;;  esophagus  . ESOPHAGOGASTRODUODENOSCOPY (  EGD) WITH PROPOFOL N/A 12/01/2019   Procedure: ESOPHAGOGASTRODUODENOSCOPY (EGD) WITH PROPOFOL;  Surgeon: Rogene Houston, MD;  Location: [facility] ENDO SUITE;  Service: Endoscopy;  Laterality: N/A;  . ESOPHAGOGASTRODUODENOSCOPY (EGD) WITH PROPOFOL N/A 12/24/2019   Procedure: ESOPHAGOGASTRODUODENOSCOPY (EGD) WITH PROPOFOL;  Surgeon: Aviva Signs, MD;  Location: [facility] ORS;  Service: General;  Laterality: N/A;   . Heart stents     2002, 2004, 2006  . PEG PLACEMENT N/A 12/24/2019   Procedure: PERCUTANEOUS ENDOSCOPIC GASTROSTOMY (PEG) PLACEMENT;  Surgeon: Aviva Signs, MD;  Location: [facility] ORS;  Service: General;  Laterality: N/A;  . PORTACATH PLACEMENT Left 12/24/2019   Procedure: INSERTION PORT-A-CATH;  Surgeon: Aviva Signs, MD;  Location: [facility] ORS;  Service: General;  Laterality: Left;     SOCIAL HISTORY:  Social History   Socioeconomic History  . Marital status: Married    Spouse name: Not on file  . Number of children: 2  . Years of education: 21  . Highest education level: Not on file  Occupational History  . Occupation: [employer]    Comment: Delivers medication  Tobacco Use  . Smoking status: Former Smoker    Packs/day: 1.00    Years: 50.00    Pack years: 50.00    Quit date: 12/21/2017    Years since quitting: 2.0  . Smokeless tobacco: Never Used  . Tobacco comment: Quit 2017  Substance and Sexual Activity  . Alcohol use: Yes    Alcohol/week: 0.0 standard drinks    Comment: 3 drinks per week  . Drug use: No  . Sexual activity: Yes  Other Topics Concern  . Not on file  Social History Narrative   Lives at home with his wife.   Right-handed.   2 diet cokes per day.   Social Determinants of Health   Financial Resource Strain: Low Risk   . Difficulty of Paying Living Expenses: Not hard at all  Food Insecurity: No Food Insecurity  . Worried About Charity fundraiser in the Last Year: Never true  . Ran Out of Food in the Last Year: Never true  Transportation Needs: No Transportation Needs  . Lack of Transportation (Medical): No  . Lack of Transportation (Non-Medical): No  Physical Activity: Insufficiently Active  . Days of Exercise per Week: 2 days  . Minutes of Exercise per Session: 30 min  Stress: No Stress Concern Present  . Feeling of Stress : Only a little  Social Connections: Not Isolated  . Frequency of Communication with Friends and Family: More than three times a  week  . Frequency of Social Gatherings with Friends and Family: More than three times a week  . Attends Religious Services: 1 to 4 times per year  . Active Member of Clubs or Organizations: No  . Attends Archivist Meetings: 1 to 4 times per year  . Marital Status: Married  Human resources officer Violence: Not At Risk  . Fear of Current or Ex-Partner: No  . Emotionally Abused: No  . Physically Abused: No  . Sexually Abused: No    FAMILY HISTORY:  Family History  Problem Relation Age of Onset  . Alzheimer's disease Mother   . Diabetes Father   . Heart disease Father   . Stroke Father   . Heart disease Brother   . Colon cancer Brother   . Alzheimer's disease Sister     CURRENT MEDICATIONS:  Outpatient Encounter Medications as of 01/07/2020  Medication Sig Note  . ascorbic acid (VITAMIN C) 500 MG  tablet Take 500 mg by mouth 2 (two) times a week.    Marland Kitchen aspirin EC 81 MG tablet Take 81 mg by mouth daily.    Marland Kitchen atorvastatin (LIPITOR) 80 MG tablet Take 80 mg by mouth every evening.    . ezetimibe (ZETIA) 10 MG tablet Take 10 mg by mouth daily.    Marland Kitchen FLUOROURACIL IV Inject into the vein every 14 (fourteen) days.   . folic acid (FOLVITE) 459 MCG tablet Take 800 mcg by mouth daily.    . Glucosamine HCl (GLUCOSAMINE PO) Take 120 mg by mouth 2 (two) times daily. 12/19/2019: On hold  . isosorbide mononitrate (IMDUR) 30 MG 24 hr tablet Take 30 mg by mouth daily.    Marland Kitchen LEUCOVORIN CALCIUM IV Inject into the vein every 14 (fourteen) days.   Marland Kitchen losartan (COZAAR) 25 MG tablet Take 25 mg by mouth daily.    . metFORMIN (GLUCOPHAGE-XR) 500 MG 24 hr tablet Take 500 mg by mouth daily with breakfast.   . metoprolol tartrate (LOPRESSOR) 25 MG tablet Take 0.5 tablets (12.5 mg total) by mouth 2 (two) times daily. (Patient taking differently: Take 25 mg by mouth 2 (two) times daily. )   . Multiple Vitamin (MULTI-VITAMINS) TABS Take 1 tablet by mouth daily.    Marland Kitchen NIVOLUMAB IV Inject into the vein every 14  (fourteen) days.   . Nutritional Supplements (FEEDING SUPPLEMENT, OSMOLITE 1.5 CAL,) LIQD Give 1 1/2 cartons of osmolite 4 times per day via PEG.  Flush with 23m of water before and after. Drink or give via tube additional 240 ml water TID. Provides 2130 calories, 89 g protein and 22895mfree water per day; meets 100% estimated needs.  Send formula and bolus supplies.   . OXALIPLATIN IV Inject into the vein every 14 (fourteen) days.   . Saw Palmetto 450 MG CAPS Take 1 capsule by mouth daily.  12/19/2019: On hold  . tamsulosin (FLOMAX) 0.4 MG CAPS capsule Take 0.4 mg by mouth daily.    . Marland KitchenLPRAZolam (XANAX) 0.5 MG tablet Take 1 tablet (0.5 mg total) by mouth 2 (two) times daily as needed for anxiety. (Patient not taking: Reported on 01/07/2020)   . HYDROcodone-acetaminophen (HYCET) 7.5-325 mg/15 ml solution Take 15 mLs by mouth every 4 (four) hours as needed for moderate pain. (Patient not taking: Reported on 01/07/2020)   . lidocaine-prilocaine (EMLA) cream Apply a dime-sized amount to port a cath site and cover with plastic wrap 1 hour prior to chemotherapy appointments (Patient not taking: Reported on 01/07/2020)   . prochlorperazine (COMPAZINE) 10 MG tablet Take 1 tablet (10 mg total) by mouth every 6 (six) hours as needed (Nausea or vomiting). (Patient not taking: Reported on 01/07/2020)    No facility-administered encounter medications on file as of 01/07/2020.    ALLERGIES:  No Known Allergies   PHYSICAL EXAM:  ECOG Performance status: 1  Vitals:   01/07/20 0953  BP: (!) 73/59  Pulse: 68  Resp: 18  Temp: 98 F (36.7 C)  SpO2: 97%   Filed Weights   01/07/20 0953  Weight: 203 lb 14.4 oz (92.5 kg)    Physical Exam Vitals reviewed.  Constitutional:      Appearance: Normal appearance.  Cardiovascular:     Rate and Rhythm: Normal rate and regular rhythm.     Heart sounds: Normal heart sounds.  Pulmonary:     Effort: Pulmonary effort is normal.     Breath sounds: Normal breath sounds.   Abdominal:  General: There is no distension.     Palpations: Abdomen is soft.  Skin:    General: Skin is warm.  Neurological:     General: No focal deficit present.     Mental Status: He is alert and oriented to person, place, and time.  Psychiatric:        Mood and Affect: Mood normal.        Behavior: Behavior normal.      LABORATORY DATA:  I have reviewed the labs as listed.  CBC    Component Value Date/Time   WBC 4.8 01/07/2020 0936   RBC 4.23 01/07/2020 0936   HGB 13.4 01/07/2020 0936   HCT 40.3 01/07/2020 0936   PLT 231 01/07/2020 0936   MCV 95.3 01/07/2020 0936   MCH 31.7 01/07/2020 0936   MCHC 33.3 01/07/2020 0936   RDW 12.3 01/07/2020 0936   LYMPHSABS 0.8 01/07/2020 0936   MONOABS 0.4 01/07/2020 0936   EOSABS 0.3 01/07/2020 0936   BASOSABS 0.0 01/07/2020 0936   CMP Latest Ref Rng & Units 01/07/2020 12/31/2019 12/14/2019  Glucose 70 - 99 mg/dL 292(H) 242(H) 182(H)  BUN 8 - 23 mg/dL _0 Creatinine 0.61 - 1.24 mg/dL 0.74 0.74 0.76  Sodium 135 - 145 mmol/L 134(L) 137 138  Potassium 3.5 - 5.1 mmol/L 4.6 4.4 4.1  Chloride 98 - 111 mmol/L 96(L) 100 106  CO2 22 - 32 mmol/L _1 Calcium 8.9 - 10.3 mg/dL 9.0 9.4 8.8(L)  Total Protein 6.5 - 8.1 g/dL 6.3(L) 6.8 -  Total Bilirubin 0.3 - 1.2 mg/dL 0.6 0.7 -  Alkaline Phos 38 - 126 U/L 47 44 -  AST 15 - 41 U/L 17 24 -  ALT 0 - 44 U/L 28 36 -       DIAGNOSTIC IMAGING:  I have independently reviewed the scans and discussed with the patient.     ASSESSMENT & PLAN:   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HCC) 1.  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 -EGD on 12/01/2019 shows normal proximal and mid esophagus with food found in the midesophagus.  Malignant appearing intrinsic severe stenosis found at 35-40 cm from the incisors.  Stenosis measured 1 cm in length.  Normal stomach, duodenal bulb and  second part of duodenum. -Biopsy consistent with adenocarcinoma in the background of intestinal metaplasia consistent with Barrett's esophagus. -PET CT scan on 12/08/2019 shows known distal esophageal neoplasm with hypermetabolic nodal metastasis in the supraclavicular region, mediastinum and upper abdomen.  No hypermetabolic metastatic disease in the liver.  Hypermetabolic lesion in the right femoral neck with no CT correlate.  Anterior left iliac lesion with no CT lesion evident.  Hypermetabolism noted at L2 vertebral body probably in the right transverse process of T2. -We reviewed results of the MRI of the pelvis from 12/27/2019 which showed pelvic osseous meta stasis in the right intertrochanteric femur measuring 1.9 x 2.2 cm.  1.0 x 1.5 cm enhancing lesion in the S1 vertebral body.  1.1 x 0.8 cm enhancing lesion in the left anterior iliac bone. -HER-2 by IHC was negative.  PD-L1 CPS score is 5. -Foundation 1 shows MS-stable, with no other targetable mutations. -FISH for HER-2 was requested. -Cycle 1 of FOLFOX and nivolumab on 12/31/2019.  We have started oxaliplatin at 68 mg/m2. -He did not experience any cold sensitivity.  He had nausea and Compazine helped.  No  vomiting.  One episode of diarrhea controlled with Imodium. -We reviewed his blood work which are grossly within normal limits.  2.  Dysphagia/weight loss: -He had a PEG tube placed on 12/24/2019. -He has increased to 6 cans of Osmolite per day on 01/01/2020. -He is also drinking 2 cans of boost high-calorie per day. -His blood sugar is 292.  I have advised him to talk to our dietitian to see if diabetic alternatives can be used.  3.  Bone metastasis: -I have talked to him about initiating him on denosumab.  We discussed the side effects in detail. -He does not have any dental problems but it has been a while since he saw his dentist.  I have told him to see his dentist and we can start him on denosumab.  4.  Anxiety: -I have  prescribed him Xanax 0.5 mg as needed.  He has taken it 1 day and it helped.      Orders placed this encounter:  No orders of the defined types were placed in this encounter.    Derek Jack, MD [place] [phone]

## 2020-01-07 NOTE — Assessment & Plan Note (Signed)
1.  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 -EGD on 12/01/2019 shows normal proximal and mid esophagus with food found in the midesophagus.  Malignant appearing intrinsic severe stenosis found at 35-40 cm from the incisors.  Stenosis measured 1 cm in length.  Normal stomach, duodenal bulb and second part of duodenum. -Biopsy consistent with adenocarcinoma in the background of intestinal metaplasia consistent with Barrett's esophagus. -PET CT scan on 12/08/2019 shows known distal esophageal neoplasm with hypermetabolic nodal metastasis in the supraclavicular region, mediastinum and upper abdomen.  No hypermetabolic metastatic disease in the liver.  Hypermetabolic lesion in the right femoral neck with no CT correlate.  Anterior left iliac lesion with no CT lesion evident.  Hypermetabolism noted at L2 vertebral body probably in the right transverse process of T2. -We reviewed results of the MRI of the pelvis from 12/27/2019 which showed pelvic osseous meta stasis in the right intertrochanteric femur measuring 1.9 x 2.2 cm.  1.0 x 1.5 cm enhancing lesion in the S1 vertebral body.  1.1 x 0.8 cm enhancing lesion in the left anterior iliac bone. -HER-2 by IHC was negative.  PD-L1 CPS score is 5. -Foundation 1 shows MS-stable, with no other targetable mutations. -FISH for HER-2 was requested. -Cycle 1 of FOLFOX and nivolumab on 12/31/2019.  We have started oxaliplatin at 68 mg/m2. -He did not experience any cold sensitivity.  He had nausea and Compazine helped.  No vomiting.  One episode of diarrhea controlled with Imodium. -We reviewed his blood work which are grossly within normal limits.  2.  Dysphagia/weight loss: -He had a PEG tube placed on 12/24/2019. -He has increased to 6 cans of Osmolite per day on 01/01/2020. -He is also drinking 2 cans of boost high-calorie per day. -His blood sugar is 292.  I have advised  him to talk to our dietitian to see if diabetic alternatives can be used.  3.  Bone metastasis: -I have talked to him about initiating him on denosumab.  We discussed the side effects in detail. -He does not have any dental problems but it has been a while since he saw his dentist.  I have told him to see his dentist and we can start him on denosumab.  4.  Anxiety: -I have prescribed him Xanax 0.5 mg as needed.  He has taken it 1 day and it helped.

## 2020-01-07 NOTE — Patient Instructions (Addendum)
[facility] at [facility] Discharge Instructions  You were seen today by Dr. Katragadda. He went over your recent lab results. He will see you back in 1 week for labs, treatment and follow up.   Thank you for choosing [facility] at [facility] to provide your oncology and hematology care.  To afford each patient quality time with our provider, please arrive at least 15 minutes before your scheduled appointment time.   If you have a lab appointment with the Cancer Center please come in thru the  Main Entrance and check in at the main information desk  You need to re-schedule your appointment should you arrive 10 or more minutes late.  We strive to give you quality time with our providers, and arriving late affects you and other patients whose appointments are after yours.  Also, if you no show three or more times for appointments you may be dismissed from the clinic at the providers discretion.     Again, thank you for choosing [facility].  Our hope is that these requests will decrease the amount of time that you wait before being seen by our physicians.       _____________________________________________________________  Should you have questions after your visit to [facility], please contact our office at [phone] between the hours of 8:00 a.m. and 4:30 p.m.  Voicemails left after 4:00 p.m. will not be returned until the following business day.  For prescription refill requests, have your pharmacy contact our office and allow 72 hours.    Cancer Center Support Programs:   > Cancer Support Group  2nd Tuesday of the month 1pm-2pm, Journey Room    

## 2020-01-08 ENCOUNTER — Encounter (INDEPENDENT_AMBULATORY_CARE_PROVIDER_SITE_OTHER): Payer: Self-pay | Admitting: Internal Medicine

## 2020-01-08 ENCOUNTER — Ambulatory Visit (INDEPENDENT_AMBULATORY_CARE_PROVIDER_SITE_OTHER): Payer: Medicare HMO | Admitting: Internal Medicine

## 2020-01-08 VITALS — BP 96/61 | HR 73 | Temp 97.7°F | Ht 70.0 in | Wt 204.5 lb

## 2020-01-08 DIAGNOSIS — Z931 Gastrostomy status: Secondary | ICD-10-CM

## 2020-01-08 DIAGNOSIS — C160 Malignant neoplasm of cardia: Secondary | ICD-10-CM

## 2020-01-08 NOTE — Patient Instructions (Signed)
Patient to call office if he has any issues with gastrostomy tube.

## 2020-01-08 NOTE — Progress Notes (Signed)
Presenting complaint;  Follow-up regarding PEG and dysphagia. Patient with recent diagnosis of adenocarcinoma of the esophagus  Database and subjective.  Patient is 69 year old Caucasian male who was to be seen in the office for dysphagia but before this happened he ended up in the emergency room on 11/30/2019 with inability to swallow solids liquids or even his pills for more than 24 hours.  Patient was hospitalized.  He underwent esophagogastroduodenoscopy on 12/01/2019 revealing foreign body in the esophagus proximal to malignant stricture extending from 35-40 cm.  Biopsy confirmed adenocarcinoma the esophagus.  He was seen by Dr. Delton Coombes.  He had PET scan on 12/08/2019 which revealed hypermetabolic lymph nodes in supra clavicular region, mediastinum, upper abdomen consistent with metastatic disease.  He also had hypermetabolic lesion in the right femoral neck and anterior left iliac spine and vertebral body L2 and right transverse process of T2. He underwent placement of Port-A-Cath and PEG by Dr. Arnoldo Morale on 12/24/2019. He had MRI of pelvis on 12/27/2019 which revealed metastatic disease to right intertrochanteric femur lesion at S1 and left anterior iliac ramus. Patient received first cycle of chemo on 12/31/2019.  Patient states he is doing well.  He is due for second cycle of chemotherapy next week.  He had few loose stools controlled with single dose of Imodium.  He is able to swallow full liquids.  He generally tries to drink 2 cans of boost daily.  He is receiving 6 cans of Osmolite daily via PEG tube. He he reports no drainage from gastrostomy site.  He only has fleeting pain when he moves in certain way.  He denies abdominal pain. Patient says he lost his sister last week Friday.  She was 34 years old in a nursing home.  Patient states he did not come in close contact with anybody at funeral.    Current Medications: Outpatient Encounter Medications as of 01/08/2020  Medication Sig  .  ALPRAZolam (XANAX) 0.5 MG tablet Take 1 tablet (0.5 mg total) by mouth 2 (two) times daily as needed for anxiety.  Marland Kitchen ascorbic acid (VITAMIN C) 500 MG tablet Take 500 mg by mouth 2 (two) times a week.   Marland Kitchen aspirin EC 81 MG tablet Take 81 mg by mouth daily.   Marland Kitchen atorvastatin (LIPITOR) 80 MG tablet Take 80 mg by mouth every evening.   . ezetimibe (ZETIA) 10 MG tablet Take 10 mg by mouth daily.   Marland Kitchen FLUOROURACIL IV Inject into the vein every 14 (fourteen) days.  . folic acid (FOLVITE) [id number] MCG tablet Take 800 mcg by mouth daily.   . isosorbide mononitrate (IMDUR) 30 MG 24 hr tablet Take 30 mg by mouth daily.   Marland Kitchen LEUCOVORIN CALCIUM IV Inject into the vein every 14 (fourteen) days.  Marland Kitchen losartan (COZAAR) 25 MG tablet Take 25 mg by mouth daily.   . metFORMIN (GLUCOPHAGE-XR) 500 MG 24 hr tablet Take 500 mg by mouth daily with breakfast.  . metoprolol tartrate (LOPRESSOR) 25 MG tablet Take 0.5 tablets (12.5 mg total) by mouth 2 (two) times daily. (Patient taking differently: Take 25 mg by mouth 2 (two) times daily. )  . Multiple Vitamin (MULTI-VITAMINS) TABS Take 1 tablet by mouth daily.   Marland Kitchen NIVOLUMAB IV Inject into the vein every 14 (fourteen) days.  . Nutritional Supplements (FEEDING SUPPLEMENT, OSMOLITE 1.5 CAL,) LIQD Give 1 1/2 cartons of osmolite 4 times per day via PEG.  Flush with 47ml of water before and after. Drink or give via tube additional 240 ml water  TID. Provides 2130 calories, 89 g protein and 2277ml free water per day; meets 100% estimated needs.  Send formula and bolus supplies.  . OXALIPLATIN IV Inject into the vein every 14 (fourteen) days.  . prochlorperazine (COMPAZINE) 10 MG tablet Take 1 tablet (10 mg total) by mouth every 6 (six) hours as needed (Nausea or vomiting).  . tamsulosin (FLOMAX) 0.4 MG CAPS capsule Take 0.4 mg by mouth daily.   . Glucosamine HCl (GLUCOSAMINE PO) Take 120 mg by mouth 2 (two) times daily.  Marland Kitchen HYDROcodone-acetaminophen (HYCET) 7.5-325 mg/15 ml solution Take 15  mLs by mouth every 4 (four) hours as needed for moderate pain. (Patient not taking: Reported on 01/07/2020)  . lidocaine-prilocaine (EMLA) cream Apply a dime-sized amount to port a cath site and cover with plastic wrap 1 hour prior to chemotherapy appointments (Patient not taking: Reported on 01/07/2020)  . Saw Palmetto 450 MG CAPS Take 1 capsule by mouth daily.    No facility-administered encounter medications on file as of 01/08/2020.     Objective: Blood pressure 96/61, pulse 73, temperature 97.7 F (36.5 C), temperature source Temporal, height 5\' 10"  (1.778 m), weight 204 lb 8 oz (92.8 kg). Patient is alert and in no acute distress. He is wearing facial mask. Conjunctiva is pink. Sclera is nonicteric Oropharyngeal mucosa is normal. No neck masses or thyromegaly noted. He has a Port-A-Cath in left pectoral region.  Skin appears to be normal. Cardiac exam with regular rhythm normal S1 and S2. No murmur or gallop noted. Lungs are clear to auscultation. Abdomen is symmetrical.  He has G-tube in place.  Stoma appears healthy without any drainage.  Abdomen is soft and nontender with organomegaly or masses. No LE edema or clubbing noted.  Assessment:  #1.  Esophageal adenocarcinoma arising in the background of Barrett's esophagus.  Unfortunately he was found to have metastatic disease therefore not a candidate for surgery.  He has received first cycle of chemotherapy which she has tolerated well.  He is not have any symptoms pertaining to bone mets.  Expect improvement in dysphagia with chemotherapy.  He eventually may develop stricture and may require dilation down the road.  #2.  PEG is functioning well and site looks healthy.  Plan:  Patient advised to drink liquids slowly and not large volume given time since he has esophageal stricture. Patient will call if he has any issues with gastrostomy tube. Office visit on as-needed basis.

## 2020-01-14 ENCOUNTER — Inpatient Hospital Stay (HOSPITAL_COMMUNITY): Payer: Medicare HMO

## 2020-01-14 ENCOUNTER — Inpatient Hospital Stay (HOSPITAL_COMMUNITY): Payer: Medicare HMO | Admitting: Hematology

## 2020-01-14 ENCOUNTER — Encounter (HOSPITAL_COMMUNITY): Payer: Self-pay | Admitting: Hematology

## 2020-01-14 ENCOUNTER — Other Ambulatory Visit: Payer: Self-pay

## 2020-01-14 ENCOUNTER — Encounter (HOSPITAL_COMMUNITY): Payer: Self-pay

## 2020-01-14 VITALS — BP 117/60 | HR 67 | Temp 98.1°F | Resp 17

## 2020-01-14 DIAGNOSIS — C160 Malignant neoplasm of cardia: Secondary | ICD-10-CM

## 2020-01-14 DIAGNOSIS — Z95828 Presence of other vascular implants and grafts: Secondary | ICD-10-CM

## 2020-01-14 DIAGNOSIS — Z5111 Encounter for antineoplastic chemotherapy: Secondary | ICD-10-CM | POA: Diagnosis not present

## 2020-01-14 LAB — COMPREHENSIVE METABOLIC PANEL
ALT: 42 U/L (ref 0–44)
AST: 29 U/L (ref 15–41)
Albumin: 3.6 g/dL (ref 3.5–5.0)
Alkaline Phosphatase: 49 U/L (ref 38–126)
Anion gap: 7 (ref 5–15)
BUN: 18 mg/dL (ref 8–23)
CO2: 30 mmol/L (ref 22–32)
Calcium: 9.1 mg/dL (ref 8.9–10.3)
Chloride: 100 mmol/L (ref 98–111)
Creatinine, Ser: 0.71 mg/dL (ref 0.61–1.24)
GFR calc Af Amer: >60 mL/min (ref >60)
GFR calc non Af Amer: >60 mL/min (ref >60)
Glucose, Bld: 246 mg/dL — ABNORMAL HIGH (ref 70–99)
Potassium: 4.1 mmol/L (ref 3.5–5.1)
Sodium: 137 mmol/L (ref 135–145)
Total Bilirubin: 0.6 mg/dL (ref 0.3–1.2)
Total Protein: 6.6 g/dL (ref 6.5–8.1)

## 2020-01-14 LAB — CBC WITH DIFFERENTIAL/PLATELET
Abs Immature Granulocytes: 0.01 10*3/uL (ref 0.00–0.07)
Basophils Absolute: 0 10*3/uL (ref 0.0–0.1)
Basophils Relative: 1 %
Eosinophils Absolute: 0.2 10*3/uL (ref 0.0–0.5)
Eosinophils Relative: 4 %
HCT: 38.6 % — ABNORMAL LOW (ref 39.0–52.0)
Hemoglobin: 12.8 g/dL — ABNORMAL LOW (ref 13.0–17.0)
Immature Granulocytes: 0 %
Lymphocytes Relative: 19 %
Lymphs Abs: 0.9 10*3/uL (ref 0.7–4.0)
MCH: 31.8 pg (ref 26.0–34.0)
MCHC: 33.2 g/dL (ref 30.0–36.0)
MCV: 95.8 fL (ref 80.0–100.0)
Monocytes Absolute: 0.4 10*3/uL (ref 0.1–1.0)
Monocytes Relative: 9 %
Neutro Abs: 3.2 10*3/uL (ref 1.7–7.7)
Neutrophils Relative %: 67 %
Platelets: 227 10*3/uL (ref 150–400)
RBC: 4.03 MIL/uL — ABNORMAL LOW (ref 4.22–5.81)
RDW: 13.2 % (ref 11.5–15.5)
WBC: 4.8 10*3/uL (ref 4.0–10.5)
nRBC: 0 % (ref 0.0–0.2)

## 2020-01-14 LAB — TSH: TSH: 4.317 u[IU]/mL (ref 0.350–4.500)

## 2020-01-14 MED ORDER — SODIUM CHLORIDE 0.9 % IV SOLN
240.0000 mg | Freq: Once | INTRAVENOUS | Status: AC
  Administered 2020-01-14: 11:00:00 240 mg via INTRAVENOUS
  Filled 2020-01-14: qty 24

## 2020-01-14 MED ORDER — SODIUM CHLORIDE 0.9 % IV SOLN: Freq: Once | INTRAVENOUS | Status: AC

## 2020-01-14 MED ORDER — DEXTROSE 5 % IV SOLN: Freq: Once | INTRAVENOUS | Status: AC

## 2020-01-14 MED ORDER — FLUOROURACIL CHEMO INJECTION 2.5 GM/50ML
400.0000 mg/m2 | Freq: Once | INTRAVENOUS | Status: AC
  Administered 2020-01-14: 14:00:00 850 mg via INTRAVENOUS
  Filled 2020-01-14: qty 17

## 2020-01-14 MED ORDER — LEUCOVORIN CALCIUM INJECTION 350 MG
400.0000 mg/m2 | Freq: Once | INTRAVENOUS | Status: AC
  Administered 2020-01-14: 12:00:00 868 mg via INTRAVENOUS
  Filled 2020-01-14: qty 10

## 2020-01-14 MED ORDER — OXALIPLATIN CHEMO INJECTION 100 MG/20ML
68.0000 mg/m2 | Freq: Once | INTRAVENOUS | Status: AC
  Administered 2020-01-14: 12:00:00 150 mg via INTRAVENOUS
  Filled 2020-01-14: qty 10

## 2020-01-14 MED ORDER — PALONOSETRON HCL INJECTION 0.25 MG/5ML
INTRAVENOUS | Status: AC
  Filled 2020-01-14: qty 5

## 2020-01-14 MED ORDER — SODIUM CHLORIDE 0.9 % IV SOLN
10.0000 mg | Freq: Once | INTRAVENOUS | Status: AC
  Administered 2020-01-14: 10 mg via INTRAVENOUS
  Filled 2020-01-14: qty 10

## 2020-01-14 MED ORDER — SODIUM CHLORIDE 0.9 % IV SOLN
2310.0000 mg/m2 | INTRAVENOUS | Status: DC
  Administered 2020-01-14: 5000 mg via INTRAVENOUS
  Filled 2020-01-14: qty 100

## 2020-01-14 MED ORDER — PALONOSETRON HCL INJECTION 0.25 MG/5ML
0.2500 mg | Freq: Once | INTRAVENOUS | Status: AC
  Administered 2020-01-14: 10:00:00 0.25 mg via INTRAVENOUS

## 2020-01-14 NOTE — Progress Notes (Signed)
Patient presents today for treatment and follow visit with Dr. Delton Coombes. Vital signs within parameter for treatment. Labs pending. Patient denies any changes since the last visit. Patient experienced slight nausea after his first treatment that resolved with his home medications. Compazine 10mg .  Patient states he feels great per his words. Patient has an appointment tomorrow for a dental cleaning per Dr. Tomie China request. Patient has some redness around his feeding tube that he wants Dr.Katragadda to assess. Upon assessment, no abnormal redness or drainage noted. No streaking noted. Instructed patient to talk to Dr. Delton Coombes about his concerns. Understanding verbalized.

## 2020-01-14 NOTE — Patient Instructions (Signed)
[facility] Discharge Instructions for Patients Receiving Chemotherapy  Today you received the following chemotherapy agents   To help prevent nausea and vomiting after your treatment, we encourage you to take your nausea medication   If you develop nausea and vomiting that is not controlled by your nausea medication, call the clinic.   BELOW ARE SYMPTOMS THAT SHOULD BE REPORTED IMMEDIATELY:  *FEVER GREATER THAN 100.5 F  *CHILLS WITH OR WITHOUT FEVER  NAUSEA AND VOMITING THAT IS NOT CONTROLLED WITH YOUR NAUSEA MEDICATION  *UNUSUAL SHORTNESS OF BREATH  *UNUSUAL BRUISING OR BLEEDING  TENDERNESS IN MOUTH AND THROAT WITH OR WITHOUT PRESENCE OF ULCERS  *URINARY PROBLEMS  *BOWEL PROBLEMS  UNUSUAL RASH Items with * indicate a potential emergency and should be followed up as soon as possible.  Feel free to call the clinic should you have any questions or concerns. The clinic phone number is [phone].  Please show the CHEMO ALERT CARD at check-in to the Emergency Department and triage nurse.   

## 2020-01-14 NOTE — Patient Instructions (Signed)
[place] at [facility] Discharge Instructions  You were seen today by Dr. Delton Coombes. He went over your recent lab results. It is ok to proceed with your normal dental cleaning. He will see you back in 2 weeks for labs, treatment and follow up.   Thank you for choosing [place] at [facility] to provide your oncology and hematology care.  To afford each patient quality time with our provider, please arrive at least 15 minutes before your scheduled appointment time.   If you have a lab appointment with the [facility] please come in thru the  Main Entrance and check in at the main information desk  You need to re-schedule your appointment should you arrive 10 or more minutes late.  We strive to give you quality time with our providers, and arriving late affects you and other patients whose appointments are after yours.  Also, if you no show three or more times for appointments you may be dismissed from the clinic at the providers discretion.     Again, thank you for choosing [facility].  Our hope is that these requests will decrease the amount of time that you wait before being seen by our physicians.       _____________________________________________________________  Should you have questions after your visit to [facility], please contact our office at [phone] between the hours of 8:00 a.m. and 4:30 p.m.  Voicemails left after 4:00 p.m. will not be returned until the following business day.  For prescription refill requests, have your pharmacy contact our office and allow 72 hours.    Cancer Center Support Programs:   > Cancer Support Group  2nd Tuesday of the month 1pm-2pm, Journey Room

## 2020-01-14 NOTE — Progress Notes (Signed)
Patient presents today for treatment and follow up visit with Dr. Delton Coombes. Message received from [facility] LPN/ Dr. Delton Coombes. Proceed with treatment.   Treatment given today per MD orders. Tolerated infusion without adverse affects. Vital signs stable. No complaints at this time. Discharged from clinic ambulatory. F/U with [facility] as scheduled.

## 2020-01-14 NOTE — Progress Notes (Signed)
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  CURRENT THERAPY: FOLFOX and Opdivo.  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[place])   12/29/2019 Cancer Staging   Staging form: Esophagus - Adenocarcinoma, AJCC 8th Edition - Clinical stage from 12/29/2019: Stage IVB (cTX, cN3, cM1) - Signed by Timothy Jack, MD on 12/29/2019   12/31/2019 -  Chemotherapy   The patient had palonosetron (ALOXI) injection 0.25 mg, 0.25 mg, Intravenous,  Once, 2 of 6 cycles Administration: 0.25 mg (12/31/2019), 0.25 mg (01/14/2020) leucovorin 868 mg in dextrose 5 % 250 mL infusion, 400 mg/m2 = 868 mg, Intravenous,  Once, 2 of 6 cycles Administration: 868 mg (12/31/2019), 868 mg (01/14/2020) oxaliplatin (ELOXATIN) 150 mg in dextrose 5 % 500 mL chemo infusion, 68 mg/m2 = 150 mg (80 % of original dose 85 mg/m2), Intravenous,  Once, 2 of 6 cycles Dose modification: 68 mg/m2 (80 % of original dose 85 mg/m2, Cycle 1, Reason: Other (see comments), Comment: diabetic neuropathy) Administration: 150 mg (12/31/2019), 150 mg (01/14/2020) fluorouracil (ADRUCIL) chemo injection 850 mg, 400 mg/m2 = 850 mg, Intravenous,  Once, 2 of 6 cycles Administration: 850 mg (12/31/2019), 850 mg (01/14/2020) fluorouracil (ADRUCIL) 5,000 mg in sodium chloride 0.9 % 150 mL chemo infusion, 2,310 mg/m2 = 5,200 mg, Intravenous, 1 Day/Dose, 2 of 6 cycles Administration: 5,000 mg (12/31/2019), 5,000 mg (01/14/2020) nivolumab (OPDIVO) 240 mg in sodium chloride 0.9 % 100 mL chemo infusion, 240 mg (100 % of original dose 240 mg), Intravenous, Once,  2 of 6 cycles Dose modification: 240 mg (original dose 240 mg, Cycle 1, Reason: Provider Judgment) Administration: 240 mg (12/31/2019), 240 mg (01/14/2020)  for chemotherapy treatment.       CANCER STAGING: Cancer Staging Adenocarcinoma of gastroesophageal junction [facility]) Staging form: Esophagus - Adenocarcinoma, AJCC 8th Edition - Clinical stage from 12/29/2019: Stage IVB (cTX, cN3, cM1) - Signed by Timothy Jack, MD on 12/29/2019    INTERVAL HISTORY:  Timothy Oconnor 69 y.o. male seen for follow-up of GE junction adenocarcinoma.  Cycle 1 of FOLFOX and Opdivo was on 12/31/2019.  Denies any skin rashes.  Denies any diarrhea.  No cough or shortness of breath present.  Appetite is 100%.  Energy levels are 75%.  Had mild nausea which resolved.    REVIEW OF SYSTEMS:  Review of Systems  Gastrointestinal: Positive for nausea.  All other systems reviewed and are negative.    PAST MEDICAL/SURGICAL HISTORY:  Past Medical History:  Diagnosis Date  . 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[place])   . Anxiety   . Diabetes ([place])   . Heart attack ([place])   . Heart disease   . High cholesterol   . Hyperplasia of prostate   . Hypertension   . Low back pain   . Port-A-Cath in place 12/30/2019  . Tremor    Past Surgical History:  Procedure Laterality Date  . arterectomy     1993  . BIOPSY  12/01/2019   Procedure: BIOPSY;  Surgeon: Rogene Houston, MD;  Location: [facility] ENDO SU[facility];  Service: Endoscopy;;  esophagus  . ESOPHAGOGASTRODUODENOSCOPY (EGD) WITH PROPOFOL N/A 12/01/2019   Procedure:  ESOPHAGOGASTRODUODENOSCOPY (EGD) WITH PROPOFOL;  Surgeon: Rogene Houston, MD;  Location: [facility] ENDO SUITE;  Service: Endoscopy;  Laterality: N/A;  . ESOPHAGOGASTRODUODENOSCOPY (EGD) WITH PROPOFOL N/A 12/24/2019   Procedure: ESOPHAGOGASTRODUODENOSCOPY (EGD) WITH PROPOFOL;  Surgeon: Aviva Signs, MD;  Location: [facility] ORS;  Service: General;  Laterality: N/A;  . Heart stents     2002, 2004, 2006  . PEG  PLACEMENT N/A 12/24/2019   Procedure: PERCUTANEOUS ENDOSCOPIC GASTROSTOMY (PEG) PLACEMENT;  Surgeon: Aviva Signs, MD;  Location: [facility] ORS;  Service: General;  Laterality: N/A;  . PORTACATH PLACEMENT Left 12/24/2019   Procedure: INSERTION PORT-A-CATH;  Surgeon: Aviva Signs, MD;  Location: [facility] ORS;  Service: General;  Laterality: Left;     SOCIAL HISTORY:  Social History   Socioeconomic History  . Marital status: Married    Spouse name: Not on file  . Number of children: 2  . Years of education: 65  . Highest education level: Not on file  Occupational History  . Occupation: [employer]    Comment: Delivers medication  Tobacco Use  . Smoking status: Former Smoker    Packs/day: 1.00    Years: 50.00    Pack years: 50.00    Quit date: 12/21/2017    Years since quitting: 2.0  . Smokeless tobacco: Never Used  . Tobacco comment: Quit 2017  Substance and Sexual Activity  . Alcohol use: Yes    Alcohol/week: 0.0 standard drinks    Comment: 3 drinks per week  . Drug use: No  . Sexual activity: Yes  Other Topics Concern  . Not on file  Social History Narrative   Lives at home with his wife.   Right-handed.   2 diet cokes per day.   Social Determinants of Health   Financial Resource Strain: Low Risk   . Difficulty of Paying Living Expenses: Not hard at all  Food Insecurity: No Food Insecurity  . Worried About Charity fundraiser in the Last Year: Never true  . Ran Out of Food in the Last Year: Never true  Transportation Needs: No Transportation Needs  . Lack of Transportation (Medical): No  . Lack of Transportation (Non-Medical): No  Physical Activity: Insufficiently Active  . Days of Exercise per Week: 2 days  . Minutes of Exercise per Session: 30 min  Stress: No Stress Concern Present  . Feeling of Stress : Only a little  Social Connections: Not Isolated  . Frequency of Communication with Friends and Family: More than three times a week  . Frequency of Social Gatherings with  Friends and Family: More than three times a week  . Attends Religious Services: 1 to 4 times per year  . Active Member of Clubs or Organizations: No  . Attends Archivist Meetings: 1 to 4 times per year  . Marital Status: Married  Human resources officer Violence: Not At Risk  . Fear of Current or Ex-Partner: No  . Emotionally Abused: No  . Physically Abused: No  . Sexually Abused: No    FAMILY HISTORY:  Family History  Problem Relation Age of Onset  . Alzheimer's disease Mother   . Diabetes Father   . Heart disease Father   . Stroke Father   . Heart disease Brother   . Colon cancer Brother   . Alzheimer's disease Sister     CURRENT MEDICATIONS:  Outpatient Encounter Medications as of 01/14/2020  Medication Sig Note  . ALPRAZolam (XANAX) 0.5 MG tablet Take 1 tablet (0.5 mg total) by mouth 2 (  two) times daily as needed for anxiety. 01/08/2020: As needed and only used once.  Marland Kitchen ascorbic acid (VITAMIN C) 500 MG tablet Take 500 mg by mouth 2 (two) times a week.    Marland Kitchen aspirin EC 81 MG tablet Take 81 mg by mouth daily.    Marland Kitchen atorvastatin (LIPITOR) 80 MG tablet Take 80 mg by mouth every evening.    . ezetimibe (ZETIA) 10 MG tablet Take 10 mg by mouth daily.    Marland Kitchen FLUOROURACIL IV Inject into the vein every 14 (fourteen) days.   . folic acid (FOLVITE) 027 MCG tablet Take 800 mcg by mouth daily.    . Glucosamine HCl (GLUCOSAMINE PO) Take 120 mg by mouth 2 (two) times daily. 12/19/2019: On hold  . HYDROcodone-acetaminophen (HYCET) 7.5-325 mg/15 ml solution Take 15 mLs by mouth every 4 (four) hours as needed for moderate pain. (Patient not taking: Reported on 01/07/2020)   . isosorbide mononitrate (IMDUR) 30 MG 24 hr tablet Take 30 mg by mouth daily.    Marland Kitchen LEUCOVORIN CALCIUM IV Inject into the vein every 14 (fourteen) days.   Marland Kitchen lidocaine-prilocaine (EMLA) cream Apply a dime-sized amount to port a cath site and cover with plastic wrap 1 hour prior to chemotherapy appointments (Patient not taking:  Reported on 01/07/2020)   . losartan (COZAAR) 25 MG tablet Take 25 mg by mouth daily.    Marland Kitchen losartan (COZAAR) 50 MG tablet Take 25 mg by mouth daily.   . metFORMIN (GLUCOPHAGE-XR) 500 MG 24 hr tablet Take 500 mg by mouth daily with breakfast.   . metoprolol tartrate (LOPRESSOR) 25 MG tablet Take 0.5 tablets (12.5 mg total) by mouth 2 (two) times daily. (Patient taking differently: Take 25 mg by mouth 2 (two) times daily. )   . Multiple Vitamin (MULTI-VITAMINS) TABS Take 1 tablet by mouth daily.    Marland Kitchen NIVOLUMAB IV Inject into the vein every 14 (fourteen) days.   . Nutritional Supplements (FEEDING SUPPLEMENT, OSMOLITE 1.5 CAL,) LIQD Give 1 1/2 cartons of osmolite 4 times per day via PEG.  Flush with 64m of water before and after. Drink or give via tube additional 240 ml water TID. Provides 2130 calories, 89 g protein and 22873mfree water per day; meets 100% estimated needs.  Send formula and bolus supplies. 01/08/2020: Patient states that he is using 6 times daily.  . OXALIPLATIN IV Inject into the vein every 14 (fourteen) days.   . prochlorperazine (COMPAZINE) 10 MG tablet Take 1 tablet (10 mg total) by mouth every 6 (six) hours as needed (Nausea or vomiting). 01/08/2020: Patient is taking as needed.  . tamsulosin (FLOMAX) 0.4 MG CAPS capsule Take 0.4 mg by mouth daily.     No facility-administered encounter medications on file as of 01/14/2020.    ALLERGIES:  No Known Allergies   PHYSICAL EXAM:  ECOG Performance status: 1  Vitals:   01/14/20 0802  BP: 119/63  Pulse: 65  Resp: 18  Temp: 98.6 F (37 C)  SpO2: 98%   Filed Weights   01/14/20 0802  Weight: 207 lb 9.6 oz (94.2 kg)    Physical Exam Vitals reviewed.  Constitutional:      Appearance: Normal appearance.  Cardiovascular:     Rate and Rhythm: Normal rate and regular rhythm.     Heart sounds: Normal heart sounds.  Pulmonary:     Effort: Pulmonary effort is normal.     Breath sounds: Normal breath sounds.  Abdominal:      General: There is  no distension.     Palpations: Abdomen is soft.  Skin:    General: Skin is warm.  Neurological:     General: No focal deficit present.     Mental Status: He is alert and oriented to person, place, and time.  Psychiatric:        Mood and Affect: Mood normal.        Behavior: Behavior normal.      LABORATORY DATA:  I have reviewed the labs as listed.  CBC    Component Value Date/Time   WBC 4.8 01/14/2020 0801   RBC 4.03 (L) 01/14/2020 0801   HGB 12.8 (L) 01/14/2020 0801   HCT 38.6 (L) 01/14/2020 0801   PLT 227 01/14/2020 0801   MCV 95.8 01/14/2020 0801   MCH 31.8 01/14/2020 0801   MCHC 33.2 01/14/2020 0801   RDW 13.2 01/14/2020 0801   LYMPHSABS 0.9 01/14/2020 0801   MONOABS 0.4 01/14/2020 0801   EOSABS 0.2 01/14/2020 0801   BASOSABS 0.0 01/14/2020 0801   CMP Latest Ref Rng & Units 01/14/2020 01/07/2020 12/31/2019  Glucose 70 - 99 mg/dL 246(H) 292(H) 242(H)  BUN 8 - 23 mg/dL _0 Creatinine 0.61 - 1.24 mg/dL 0.71 0.74 0.74  Sodium 135 - 145 mmol/L 137 134(L) 137  Potassium 3.5 - 5.1 mmol/L 4.1 4.6 4.4  Chloride 98 - 111 mmol/L 100 96(L) 100  CO2 22 - 32 mmol/L _1 Calcium 8.9 - 10.3 mg/dL 9.1 9.0 9.4  Total Protein 6.5 - 8.1 g/dL 6.6 6.3(L) 6.8  Total Bilirubin 0.3 - 1.2 mg/dL 0.6 0.6 0.7  Alkaline Phos 38 - 126 U/L 49 47 44  AST 15 - 41 U/L _2 ALT 0 - 44 U/L 42 28 36       DIAGNOSTIC IMAGING:  I have independently reviewed the scans and discussed with the patient.     ASSESSMENT & PLAN:   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HCC) 1.  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  PD-L1 CPS score is 5.  HER-2 FISH is pending. -Cycle 1 of FOLFOX and nivolumab on 12/31/2019. -I have reviewed his labs.  White count is 4.8 with normal ANC.  Platelets are 227.  LFTs are normal. -He will proceed with cycle 3 today.  Oxaliplatin is dose reduced at 68 mg per metered squared.  We will plan to  increase full dose next time.  2.  Nutrition: -PEG tube was placed on 12/24/2019.  He is taking in 7 cans of Osmolite per day. -He is also drinking high-calorie boost.  Weight improved by 3 pounds.  3.  Bone metastasis: -We plan to give denosumab to decrease skeletal related events.  He is seeing dentist for cleaning tomorrow.  4.  Anxiety: -I have given prescription for Xanax 0.5 mg as needed.  He has taken once or twice and it helps.      Orders placed this encounter:  No orders of the defined types were placed in this encounter.    Timothy Jack, MD [place] [phone]

## 2020-01-14 NOTE — Progress Notes (Signed)
Patient has been assessed, vital signs and labs have been reviewed by Dr. Katragadda. ANC, Creatinine, LFTs, and Platelets are within treatment parameters per Dr. Katragadda. The patient is good to proceed with treatment at this time.  

## 2020-01-15 ENCOUNTER — Telehealth (HOSPITAL_COMMUNITY): Payer: Self-pay | Admitting: *Deleted

## 2020-01-15 NOTE — Telephone Encounter (Signed)
Called patient to introduce cardiac program. Patient stated he is not interested at this time. He is undergoing chemotherapy.

## 2020-01-16 ENCOUNTER — Other Ambulatory Visit: Payer: Self-pay

## 2020-01-16 ENCOUNTER — Inpatient Hospital Stay (HOSPITAL_COMMUNITY): Payer: Medicare HMO

## 2020-01-16 ENCOUNTER — Encounter (HOSPITAL_COMMUNITY): Payer: Self-pay

## 2020-01-16 VITALS — BP 102/54 | HR 64 | Temp 97.7°F | Resp 18

## 2020-01-16 DIAGNOSIS — Z5111 Encounter for antineoplastic chemotherapy: Secondary | ICD-10-CM | POA: Diagnosis not present

## 2020-01-16 DIAGNOSIS — Z95828 Presence of other vascular implants and grafts: Secondary | ICD-10-CM

## 2020-01-16 DIAGNOSIS — C160 Malignant neoplasm of cardia: Secondary | ICD-10-CM

## 2020-01-16 MED ORDER — SODIUM CHLORIDE 0.9% FLUSH
10.0000 mL | INTRAVENOUS | Status: DC | PRN
  Administered 2020-01-16: 10 mL

## 2020-01-16 MED ORDER — HEPARIN SOD (PORK) LOCK FLUSH 100 UNIT/ML IV SOLN
500.0000 [IU] | Freq: Once | INTRAVENOUS | Status: AC | PRN
  Administered 2020-01-16: 500 [IU]

## 2020-01-16 NOTE — Patient Instructions (Signed)
[facility] at [facility] Discharge Instructions  5FU pump discontinued with portacath flushed per protocol. Follow-up as scheduled. Call clinic for any questions or concerns   Thank you for choosing [facility] at [facility] to provide your oncology and hematology care.  To afford each patient quality time with our provider, please arrive at least 15 minutes before your scheduled appointment time.   If you have a lab appointment with the Cancer Center please come in thru the Main Entrance and check in at the main information desk.  You need to re-schedule your appointment should you arrive 10 or more minutes late.  We strive to give you quality time with our providers, and arriving late affects you and other patients whose appointments are after yours.  Also, if you no show three or more times for appointments you may be dismissed from the clinic at the providers discretion.     Again, thank you for choosing [facility].  Our hope is that these requests will decrease the amount of time that you wait before being seen by our physicians.       _____________________________________________________________  Should you have questions after your visit to [facility], please contact our office at [phone] between the hours of 8:00 a.m. and 4:30 p.m.  Voicemails left after 4:00 p.m. will not be returned until the following business day.  For prescription refill requests, have your pharmacy contact our office and allow 72 hours.    Due to Covid, you will need to wear a mask upon entering the hospital. If you do not have a mask, a mask will be given to you at the Main Entrance upon arrival. For doctor visits, patients may have 1 support person with them. For treatment visits, patients can not have anyone with them due to social distancing guidelines and our immunocompromised population.     

## 2020-01-16 NOTE — Progress Notes (Signed)
Nutrition Follow-up:  Patient with stage IV esophageal adenocarcinoma with mets to the bone.  Patient receiving folfox chemotherapy.   Met with patient today prior to pump removal.  Patient reports that he has been taking 6 cartons of osmolite 1.5 via feeding tube (1 carton, 2 cartons, 2 cartons and 1 carton). He has been drinking or putting through tube Boost VHC shake as well (getting tired of taste of oral shakes) typically does this in the am with am osmolite feeding.  During the day drinks broth, jello, yogurt.  Had frosty recently, sometimes milkshake.  Drinks water (about 4, 16 oz bottles drinking and flushing), some milk and gingerale.    Reports some issues with nausea after the first treatment, took compazine and improved.  Also had few issues with diarrhea and imodium tablet helped.    Medications: reviewed  Labs: reviewed  Anthropometrics:   Weight 207 lb 9.6 oz on 2/10 increased from 205 lb on 1/27   Re-Estimated Energy Needs  Kcals: 2820-3200 Protein: 141-160 Fluid: > 2.8 L  NUTRITION DIAGNOSIS: Inadequate oral intake continues but relying on feeding tube for nutrition   INTERVENTION:  Patient will continue with 6 cartons of osmolite 1.5 via feeding tube.   Patient will continue drinking or via feeding tube Boost VHC daily and ensure enlive daily.   Tube feeding including boost VHC and ensure enlive provides 3010 calories, 131 g protein, 3080 ml free water (formula and oral intake.  Patient has contact information    MONITORING, EVALUATION, GOAL: Patient will utilize feeding tube to and oral intake to receive adequate calories and protein to preserve muscle mass and maintain weight   NEXT VISIT: Feb 26 at pump removal  Thorn Demas B. Zenia Resides, [place], [place] Registered Dietitian [phone] (pager)

## 2020-01-16 NOTE — Progress Notes (Signed)
Timothy Oconnor tolerated 5FU pump well without complaints or incident. 5FU pump discontinued with portacath flushed easily per protocol then de-accessed. VSS Pt discharged self ambulatory in satisfactory condition

## 2020-01-17 NOTE — Assessment & Plan Note (Signed)
1.  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  PD-L1 CPS score is 5.  HER-2 FISH is pending. -Cycle 1 of FOLFOX and nivolumab on 12/31/2019. -I have reviewed his labs.  White count is 4.8 with normal ANC.  Platelets are 227.  LFTs are normal. -Timothy Oconnor will proceed with cycle 3 today.  Oxaliplatin is dose reduced at 68 mg per metered squared.  We will plan to increase full dose next time.  2.  Nutrition: -PEG tube was placed on 12/24/2019.  Timothy Oconnor is taking in 7 cans of Osmolite per day. -Timothy Oconnor is also drinking high-calorie boost.  Weight improved by 3 pounds.  3.  Bone metastasis: -We plan to give denosumab to decrease skeletal related events.  Timothy Oconnor is seeing dentist for cleaning tomorrow.  4.  Anxiety: -I have given prescription for Xanax 0.5 mg as needed.  Timothy Oconnor has taken once or twice and it helps.

## 2020-01-28 ENCOUNTER — Inpatient Hospital Stay (HOSPITAL_COMMUNITY): Payer: Medicare HMO

## 2020-01-28 ENCOUNTER — Encounter (HOSPITAL_COMMUNITY): Payer: Self-pay | Admitting: Hematology

## 2020-01-28 ENCOUNTER — Inpatient Hospital Stay (HOSPITAL_COMMUNITY): Payer: Medicare HMO | Admitting: Hematology

## 2020-01-28 ENCOUNTER — Other Ambulatory Visit: Payer: Self-pay

## 2020-01-28 VITALS — BP 117/65 | HR 63 | Temp 97.6°F | Resp 18

## 2020-01-28 DIAGNOSIS — Z95828 Presence of other vascular implants and grafts: Secondary | ICD-10-CM

## 2020-01-28 DIAGNOSIS — C160 Malignant neoplasm of cardia: Secondary | ICD-10-CM

## 2020-01-28 DIAGNOSIS — Z5111 Encounter for antineoplastic chemotherapy: Secondary | ICD-10-CM | POA: Diagnosis not present

## 2020-01-28 LAB — CBC WITH DIFFERENTIAL/PLATELET
Abs Immature Granulocytes: 0.01 10*3/uL (ref 0.00–0.07)
Basophils Absolute: 0 10*3/uL (ref 0.0–0.1)
Basophils Relative: 1 %
Eosinophils Absolute: 0.2 10*3/uL (ref 0.0–0.5)
Eosinophils Relative: 3 %
HCT: 37.5 % — ABNORMAL LOW (ref 39.0–52.0)
Hemoglobin: 12.3 g/dL — ABNORMAL LOW (ref 13.0–17.0)
Immature Granulocytes: 0 %
Lymphocytes Relative: 20 %
Lymphs Abs: 0.9 10*3/uL (ref 0.7–4.0)
MCH: 31.8 pg (ref 26.0–34.0)
MCHC: 32.8 g/dL (ref 30.0–36.0)
MCV: 96.9 fL (ref 80.0–100.0)
Monocytes Absolute: 0.5 10*3/uL (ref 0.1–1.0)
Monocytes Relative: 11 %
Neutro Abs: 3 10*3/uL (ref 1.7–7.7)
Neutrophils Relative %: 65 %
Platelets: 182 10*3/uL (ref 150–400)
RBC: 3.87 MIL/uL — ABNORMAL LOW (ref 4.22–5.81)
RDW: 14.1 % (ref 11.5–15.5)
WBC: 4.6 10*3/uL (ref 4.0–10.5)
nRBC: 0 % (ref 0.0–0.2)

## 2020-01-28 LAB — COMPREHENSIVE METABOLIC PANEL
ALT: 31 U/L (ref 0–44)
AST: 23 U/L (ref 15–41)
Albumin: 3.5 g/dL (ref 3.5–5.0)
Alkaline Phosphatase: 51 U/L (ref 38–126)
Anion gap: 8 (ref 5–15)
BUN: 20 mg/dL (ref 8–23)
CO2: 28 mmol/L (ref 22–32)
Calcium: 9 mg/dL (ref 8.9–10.3)
Chloride: 102 mmol/L (ref 98–111)
Creatinine, Ser: 0.71 mg/dL (ref 0.61–1.24)
GFR calc Af Amer: >60 mL/min (ref >60)
GFR calc non Af Amer: >60 mL/min (ref >60)
Glucose, Bld: 289 mg/dL — ABNORMAL HIGH (ref 70–99)
Potassium: 4.2 mmol/L (ref 3.5–5.1)
Sodium: 138 mmol/L (ref 135–145)
Total Bilirubin: 0.4 mg/dL (ref 0.3–1.2)
Total Protein: 6.3 g/dL — ABNORMAL LOW (ref 6.5–8.1)

## 2020-01-28 LAB — TSH: TSH: 2.563 u[IU]/mL (ref 0.350–4.500)

## 2020-01-28 MED ORDER — DEXTROSE 5 % IV SOLN: Freq: Once | INTRAVENOUS | Status: AC

## 2020-01-28 MED ORDER — SODIUM CHLORIDE 0.9 % IV SOLN
2310.0000 mg/m2 | INTRAVENOUS | Status: DC
  Administered 2020-01-28: 5000 mg via INTRAVENOUS
  Filled 2020-01-28: qty 100

## 2020-01-28 MED ORDER — OXALIPLATIN CHEMO INJECTION 100 MG/20ML
68.0000 mg/m2 | Freq: Once | INTRAVENOUS | Status: AC
  Administered 2020-01-28: 11:00:00 150 mg via INTRAVENOUS
  Filled 2020-01-28: qty 10

## 2020-01-28 MED ORDER — METFORMIN HCL ER (OSM) 1000 MG PO TB24: 1000.0000 mg | ORAL_TABLET | Freq: Every day | ORAL | 3 refills | Status: DC

## 2020-01-28 MED ORDER — FLUOROURACIL CHEMO INJECTION 2.5 GM/50ML
400.0000 mg/m2 | Freq: Once | INTRAVENOUS | Status: AC
  Administered 2020-01-28: 13:00:00 850 mg via INTRAVENOUS
  Filled 2020-01-28: qty 17

## 2020-01-28 MED ORDER — SODIUM CHLORIDE 0.9 % IV SOLN
10.0000 mg | Freq: Once | INTRAVENOUS | Status: AC
  Administered 2020-01-28: 10 mg via INTRAVENOUS
  Filled 2020-01-28: qty 10

## 2020-01-28 MED ORDER — SODIUM CHLORIDE 0.9% FLUSH
10.0000 mL | INTRAVENOUS | Status: DC | PRN
  Administered 2020-01-28: 08:00:00 10 mL

## 2020-01-28 MED ORDER — PALONOSETRON HCL INJECTION 0.25 MG/5ML
0.2500 mg | Freq: Once | INTRAVENOUS | Status: AC
  Administered 2020-01-28: 0.25 mg via INTRAVENOUS
  Filled 2020-01-28: qty 5

## 2020-01-28 MED ORDER — SODIUM CHLORIDE 0.9 % IV SOLN
240.0000 mg | Freq: Once | INTRAVENOUS | Status: AC
  Administered 2020-01-28: 240 mg via INTRAVENOUS
  Filled 2020-01-28: qty 24

## 2020-01-28 MED ORDER — LEUCOVORIN CALCIUM INJECTION 350 MG
400.0000 mg/m2 | Freq: Once | INTRAVENOUS | Status: AC
  Administered 2020-01-28: 868 mg via INTRAVENOUS
  Filled 2020-01-28: qty 43.4

## 2020-01-28 NOTE — Progress Notes (Signed)
0858 Labs reviewed with and pt seen by Dr. Delton Coombes and pt approved for Opdivo and FOLFOX infusions today per MD            Lynnae Prude tolerated chemo tx well without complaints or incident. Pt discharged with 5FU pump infusing without issues. VSS upon discharge. Pt discharged self ambulatory in satisfactory condition

## 2020-01-28 NOTE — Progress Notes (Signed)
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  CURRENT THERAPY: FOLFOX and Opdivo.  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[place])   12/29/2019 Cancer Staging   Staging form: Esophagus - Adenocarcinoma, AJCC 8th Edition - Clinical stage from 12/29/2019: Stage IVB (cTX, cN3, cM1) - Signed by Derek Jack, MD on 12/29/2019   12/31/2019 -  Chemotherapy   The patient had palonosetron (ALOXI) injection 0.25 mg, 0.25 mg, Intravenous,  Once, 3 of 6 cycles Administration: 0.25 mg (12/31/2019), 0.25 mg (01/14/2020), 0.25 mg (01/28/2020) leucovorin 868 mg in dextrose 5 % 250 mL infusion, 400 mg/m2 = 868 mg, Intravenous,  Once, 3 of 6 cycles Administration: 868 mg (12/31/2019), 868 mg (01/14/2020), 868 mg (01/28/2020) oxaliplatin (ELOXATIN) 150 mg in dextrose 5 % 500 mL chemo infusion, 68 mg/m2 = 150 mg (80 % of original dose 85 mg/m2), Intravenous,  Once, 3 of 6 cycles Dose modification: 68 mg/m2 (80 % of original dose 85 mg/m2, Cycle 1, Reason: Other (see comments), Comment: diabetic neuropathy) Administration: 150 mg (12/31/2019), 150 mg (01/14/2020), 150 mg (01/28/2020) fluorouracil (ADRUCIL) chemo injection 850 mg, 400 mg/m2 = 850 mg, Intravenous,  Once, 3 of 6 cycles Administration: 850 mg (12/31/2019), 850 mg (01/14/2020), 850 mg (01/28/2020) fluorouracil (ADRUCIL) 5,000 mg in sodium chloride 0.9 % 150 mL chemo infusion, 2,310 mg/m2 = 5,200 mg, Intravenous, 1 Day/Dose, 3 of 6 cycles Administration: 5,000 mg (12/31/2019), 5,000 mg (01/14/2020), 5,000 mg (01/28/2020) nivolumab (OPDIVO) 240 mg in  sodium chloride 0.9 % 100 mL chemo infusion, 240 mg (100 % of original dose 240 mg), Intravenous, Once, 3 of 6 cycles Dose modification: 240 mg (original dose 240 mg, Cycle 1, Reason: Provider Judgment) Administration: 240 mg (12/31/2019), 240 mg (01/14/2020), 240 mg (01/28/2020)  for chemotherapy treatment.       CANCER STAGING: Cancer Staging Adenocarcinoma of gastroesophageal junction [facility]) Staging form: Esophagus - Adenocarcinoma, AJCC 8th Edition - Clinical stage from 12/29/2019: Stage IVB (cTX, cN3, cM1) - Signed by Derek Jack, MD on 12/29/2019    INTERVAL HISTORY:  Timothy Oconnor 69 y.o. male seen for follow-up of GE junction adenocarcinoma.  Cycle 2 was on 01/14/2020.  Appetite is 75%.  Energy levels are 50 to 75%.  He started eating soft foods and is able to swallow them.  He is eating at least once a day.  He is also drinking 2 cans of high energy boost per day.  Occasional nausea is controlled with Compazine.  He takes Xanax as needed averaging twice per week.   REVIEW OF SYSTEMS:  Review of Systems  Gastrointestinal: Positive for nausea.  All other systems reviewed and are negative.    PAST MEDICAL/SURGICAL HISTORY:  Past Medical History:  Diagnosis Date  . 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 Anxiety   . Diabetes ([place])   . Heart attack ([place])   . Heart disease   . High cholesterol   . Hyperplasia of prostate   . Hypertension   . Low back pain   . Port-A-Cath in place 12/30/2019  . Tremor    Past Surgical History:  Procedure Laterality Date  .  arterectomy     1993  . BIOPSY  12/01/2019   Procedure: BIOPSY;  Surgeon: Rogene Houston, MD;  Location: [facility] ENDO SUITE;  Service: Endoscopy;;  esophagus  . ESOPHAGOGASTRODUODENOSCOPY (EGD) WITH PROPOFOL N/A 12/01/2019   Procedure: ESOPHAGOGASTRODUODENOSCOPY (EGD) WITH PROPOFOL;  Surgeon: Rogene Houston, MD;  Location: [facility] ENDO SUITE;  Service: Endoscopy;  Laterality: N/A;  .  ESOPHAGOGASTRODUODENOSCOPY (EGD) WITH PROPOFOL N/A 12/24/2019   Procedure: ESOPHAGOGASTRODUODENOSCOPY (EGD) WITH PROPOFOL;  Surgeon: Aviva Signs, MD;  Location: [facility] ORS;  Service: General;  Laterality: N/A;  . Heart stents     2002, 2004, 2006  . PEG PLACEMENT N/A 12/24/2019   Procedure: PERCUTANEOUS ENDOSCOPIC GASTROSTOMY (PEG) PLACEMENT;  Surgeon: Aviva Signs, MD;  Location: [facility] ORS;  Service: General;  Laterality: N/A;  . PORTACATH PLACEMENT Left 12/24/2019   Procedure: INSERTION PORT-A-CATH;  Surgeon: Aviva Signs, MD;  Location: [facility] ORS;  Service: General;  Laterality: Left;     SOCIAL HISTORY:  Social History   Socioeconomic History  . Marital status: Married    Spouse name: Not on file  . Number of children: 2  . Years of education: 49  . Highest education level: Not on file  Occupational History  . Occupation: [employer]    Comment: Delivers medication  Tobacco Use  . Smoking status: Former Smoker    Packs/day: 1.00    Years: 50.00    Pack years: 50.00    Quit date: 12/21/2017    Years since quitting: 2.1  . Smokeless tobacco: Never Used  . Tobacco comment: Quit 2017  Substance and Sexual Activity  . Alcohol use: Yes    Alcohol/week: 0.0 standard drinks    Comment: 3 drinks per week  . Drug use: No  . Sexual activity: Yes  Other Topics Concern  . Not on file  Social History Narrative   Lives at home with his wife.   Right-handed.   2 diet cokes per day.   Social Determinants of Health   Financial Resource Strain: Low Risk   . Difficulty of Paying Living Expenses: Not hard at all  Food Insecurity: No Food Insecurity  . Worried About Charity fundraiser in the Last Year: Never true  . Ran Out of Food in the Last Year: Never true  Transportation Needs: No Transportation Needs  . Lack of Transportation (Medical): No  . Lack of Transportation (Non-Medical): No  Physical Activity: Insufficiently Active  . Days of Exercise per Week: 2 days  . Minutes of Exercise  per Session: 30 min  Stress: No Stress Concern Present  . Feeling of Stress : Only a little  Social Connections: Not Isolated  . Frequency of Communication with Friends and Family: More than three times a week  . Frequency of Social Gatherings with Friends and Family: More than three times a week  . Attends Religious Services: 1 to 4 times per year  . Active Member of Clubs or Organizations: No  . Attends Archivist Meetings: 1 to 4 times per year  . Marital Status: Married  Human resources officer Violence: Not At Risk  . Fear of Current or Ex-Partner: No  . Emotionally Abused: No  . Physically Abused: No  . Sexually Abused: No    FAMILY HISTORY:  Family History  Problem Relation Age of Onset  . Alzheimer's disease Mother   . Diabetes Father   . Heart disease Father   . Stroke Father   . Heart disease Brother   .  Colon cancer Brother   . Alzheimer's disease Sister     CURRENT MEDICATIONS:  Outpatient Encounter Medications as of 01/28/2020  Medication Sig Note  . ALPRAZolam (XANAX) 0.5 MG tablet Take 1 tablet (0.5 mg total) by mouth 2 (two) times daily as needed for anxiety. 01/08/2020: As needed and only used once.  Marland Kitchen ascorbic acid (VITAMIN C) 500 MG tablet Take 500 mg by mouth 2 (two) times a week.    Marland Kitchen aspirin EC 81 MG tablet Take 81 mg by mouth daily.    Marland Kitchen atorvastatin (LIPITOR) 80 MG tablet Take 80 mg by mouth every evening.    . ezetimibe (ZETIA) 10 MG tablet Take 10 mg by mouth daily.    Marland Kitchen FLUOROURACIL IV Inject into the vein every 14 (fourteen) days.   . folic acid (FOLVITE) 295 MCG tablet Take 800 mcg by mouth daily.    . Glucosamine HCl (GLUCOSAMINE PO) Take 120 mg by mouth 2 (two) times daily. 12/19/2019: On hold  . HYDROcodone-acetaminophen (HYCET) 7.5-325 mg/15 ml solution Take 15 mLs by mouth every 4 (four) hours as needed for moderate pain. (Patient not taking: Reported on 01/07/2020)   . isosorbide mononitrate (IMDUR) 30 MG 24 hr tablet Take 30 mg by mouth daily.     Marland Kitchen LEUCOVORIN CALCIUM IV Inject into the vein every 14 (fourteen) days.   Marland Kitchen lidocaine-prilocaine (EMLA) cream Apply a dime-sized amount to port a cath site and cover with plastic wrap 1 hour prior to chemotherapy appointments (Patient not taking: Reported on 01/07/2020)   . losartan (COZAAR) 25 MG tablet Take 25 mg by mouth daily.    Marland Kitchen losartan (COZAAR) 50 MG tablet Take 25 mg by mouth daily.   . metFORMIN (FORTAMET) 1000 MG (OSM) 24 hr tablet Take 1 tablet (1,000 mg total) by mouth daily with breakfast.   . metoprolol tartrate (LOPRESSOR) 25 MG tablet Take 0.5 tablets (12.5 mg total) by mouth 2 (two) times daily. (Patient taking differently: Take 25 mg by mouth 2 (two) times daily. )   . Multiple Vitamin (MULTI-VITAMINS) TABS Take 1 tablet by mouth daily.    Marland Kitchen NIVOLUMAB IV Inject into the vein every 14 (fourteen) days.   . Nutritional Supplements (FEEDING SUPPLEMENT, OSMOLITE 1.5 CAL,) LIQD Give 1 1/2 cartons of osmolite 4 times per day via PEG.  Flush with 39m of water before and after. Drink or give via tube additional 240 ml water TID. Provides 2130 calories, 89 g protein and 22838mfree water per day; meets 100% estimated needs.  Send formula and bolus supplies. 01/08/2020: Patient states that he is using 6 times daily.  . OXALIPLATIN IV Inject into the vein every 14 (fourteen) days.   . prochlorperazine (COMPAZINE) 10 MG tablet Take 1 tablet (10 mg total) by mouth every 6 (six) hours as needed (Nausea or vomiting). 01/08/2020: Patient is taking as needed.  . tamsulosin (FLOMAX) 0.4 MG CAPS capsule Take 0.4 mg by mouth daily.    . [DISCONTINUED] metFORMIN (GLUCOPHAGE-XR) 500 MG 24 hr tablet Take 500 mg by mouth daily with breakfast.    No facility-administered encounter medications on file as of 01/28/2020.    ALLERGIES:  No Known Allergies   PHYSICAL EXAM:  ECOG Performance status: 1  Vitals:   01/28/20 0819  BP: 98/68  Pulse: 71  Resp: 16  Temp: 97.7 F (36.5 C)  SpO2: 97%    Filed Weights   01/28/20 0819  Weight: 212 lb 12.8 oz (96.5 kg)    Physical  Exam Vitals reviewed.  Constitutional:      Appearance: Normal appearance.  Cardiovascular:     Rate and Rhythm: Normal rate and regular rhythm.     Heart sounds: Normal heart sounds.  Pulmonary:     Effort: Pulmonary effort is normal.     Breath sounds: Normal breath sounds.  Abdominal:     General: There is no distension.     Palpations: Abdomen is soft.  Skin:    General: Skin is warm.  Neurological:     General: No focal deficit present.     Mental Status: He is alert and oriented to person, place, and time.  Psychiatric:        Mood and Affect: Mood normal.        Behavior: Behavior normal.      LABORATORY DATA:  I have reviewed the labs as listed.  CBC    Component Value Date/Time   WBC 4.6 01/28/2020 0820   RBC 3.87 (L) 01/28/2020 0820   HGB 12.3 (L) 01/28/2020 0820   HCT 37.5 (L) 01/28/2020 0820   PLT 182 01/28/2020 0820   MCV 96.9 01/28/2020 0820   MCH 31.8 01/28/2020 0820   MCHC 32.8 01/28/2020 0820   RDW 14.1 01/28/2020 0820   LYMPHSABS 0.9 01/28/2020 0820   MONOABS 0.5 01/28/2020 0820   EOSABS 0.2 01/28/2020 0820   BASOSABS 0.0 01/28/2020 0820   CMP Latest Ref Rng & Units 01/28/2020 01/14/2020 01/07/2020  Glucose 70 - 99 mg/dL 289(H) 246(H) 292(H)  BUN 8 - 23 mg/dL _0 Creatinine 0.61 - 1.24 mg/dL 0.71 0.71 0.74  Sodium 135 - 145 mmol/L 138 137 134(L)  Potassium 3.5 - 5.1 mmol/L 4.2 4.1 4.6  Chloride 98 - 111 mmol/L 102 100 96(L)  CO2 22 - 32 mmol/L _1 Calcium 8.9 - 10.3 mg/dL 9.0 9.1 9.0  Total Protein 6.5 - 8.1 g/dL 6.3(L) 6.6 6.3(L)  Total Bilirubin 0.3 - 1.2 mg/dL 0.4 0.6 0.6  Alkaline Phos 38 - 126 U/L 51 49 47  AST 15 - 41 U/L _2 ALT 0 - 44 U/L 31 42 28       DIAGNOSTIC IMAGING:  I have independently reviewed scans and discussed with the patient.     ASSESSMENT & PLAN:   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HCC) 1.   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  PD-L1 CPS score is 5.  HER-2 by FISH is pending. -2 cycles of FOLFOX and nivolumab on 12/31/2019 and 01/14/2020. -He did not experience any immunotherapy related side effects. -We reviewed his labs.  White count and platelet count is normal.  LFTs are normal. -She will proceed with his cycle 3 today.  2.  Nutrition: -PEG tube was placed on 12/24/2019.  He is taking in 6 cans of Osmolite.  He is also drinking 2 to 3 cans of high-calorie boost. -He gained 5 pounds. -He also started eating some soft foods once a day.  3.  Diabetes: -His blood sugar is elevated at 323.  He is on Metformin XR 500 mg daily. -I will increase Metformin to 1000 mg daily.  4.  Bone metastasis: -We talked about starting him on denosumab.  He had dental work-up done and a tooth filling done on 01/22/2020. -We will wait until next cycle prior to start of denosumab.  5.  Anxiety: -He is using Xanax 0.5 mg as needed.  He is taking it  twice weekly.      Orders placed this encounter:  No orders of the defined types were placed in this encounter.    Derek Jack, MD [place] [phone]

## 2020-01-28 NOTE — Patient Instructions (Signed)
[facility] Discharge Instructions for Patients Receiving Chemotherapy   Beginning January 23rd 2017 lab work for the [facility] will be done in the  Main lab at [facility] on 1st floor. If you have a lab appointment with the [facility] please come in thru the  Main Entrance and check in at the main information desk   Today you received the following chemotherapy agents Opdivo,Oxaliplatin,Leucovorin and 5FU. Follow-up as scheduled. Call clinic for any questions or concerns  To help prevent nausea and vomiting after your treatment, we encourage you to take your nausea medication   If you develop nausea and vomiting, or diarrhea that is not controlled by your medication, call the clinic.  The clinic phone number is [phone]. Office hours are Monday-Friday 8:30am-5:00pm.  BELOW ARE SYMPTOMS THAT SHOULD BE REPORTED IMMEDIATELY:  *FEVER GREATER THAN 101.0 F  *CHILLS WITH OR WITHOUT FEVER  NAUSEA AND VOMITING THAT IS NOT CONTROLLED WITH YOUR NAUSEA MEDICATION  *UNUSUAL SHORTNESS OF BREATH  *UNUSUAL BRUISING OR BLEEDING  TENDERNESS IN MOUTH AND THROAT WITH OR WITHOUT PRESENCE OF ULCERS  *URINARY PROBLEMS  *BOWEL PROBLEMS  UNUSUAL RASH Items with * indicate a potential emergency and should be followed up as soon as possible. If you have an emergency after office hours please contact your primary care physician or go to the nearest emergency department.  Please call the clinic during office hours if you have any questions or concerns.   You may also contact the Patient Navigator at [phone] should you have any questions or need assistance in obtaining follow up care.      Resources For Cancer Patients and their Caregivers ? American Cancer Society: Can assist with transportation, wigs, general needs, runs Look Good Feel Better.        3196466168 ? Cancer Care: Provides financial assistance, online support groups, medication/co-pay  assistance.  1-800-813-HOPE 2490125198) ? [place] Assists [place] cancer patients and their families through emotional , educational and financial support.  (954)140-8947 ? [place] DSS Where to apply for food stamps, Medicaid and utility assistance. 770-015-6743 ? [facility]: Transportation to medical appointments. 647-769-2271 ? Social Security Administration: May apply for disability if have a Stage IV cancer. 757-810-7863 312-386-7697 ? [employer], Disability and Transit Services: Assists with nutrition, care and transit needs. (516)269-4526

## 2020-01-28 NOTE — Patient Instructions (Signed)
[facility] at [facility] Discharge Instructions  Labs drawn from portacath today   Thank you for choosing [facility] at [facility] to provide your oncology and hematology care.  To afford each patient quality time with our provider, please arrive at least 15 minutes before your scheduled appointment time.   If you have a lab appointment with the Cancer Center please come in thru the Main Entrance and check in at the main information desk.  You need to re-schedule your appointment should you arrive 10 or more minutes late.  We strive to give you quality time with our providers, and arriving late affects you and other patients whose appointments are after yours.  Also, if you no show three or more times for appointments you may be dismissed from the clinic at the providers discretion.     Again, thank you for choosing [facility].  Our hope is that these requests will decrease the amount of time that you wait before being seen by our physicians.       _____________________________________________________________  Should you have questions after your visit to [facility], please contact our office at [phone] between the hours of 8:00 a.m. and 4:30 p.m.  Voicemails left after 4:00 p.m. will not be returned until the following business day.  For prescription refill requests, have your pharmacy contact our office and allow 72 hours.    Due to Covid, you will need to wear a mask upon entering the hospital. If you do not have a mask, a mask will be given to you at the Main Entrance upon arrival. For doctor visits, patients may have 1 support person with them. For treatment visits, patients can not have anyone with them due to social distancing guidelines and our immunocompromised population.     

## 2020-01-28 NOTE — Progress Notes (Signed)
Patient has been assessed, vital signs and labs have been reviewed by Dr. Katragadda. ANC, Creatinine, LFTs, and Platelets are within treatment parameters per Dr. Katragadda. The patient is good to proceed with treatment at this time.  

## 2020-01-28 NOTE — Patient Instructions (Addendum)
[place] at [facility] Discharge Instructions  You were seen today by Dr. Delton Coombes. He went over your recent lab results. He has sent a new prescription for Metformin 1000mg  daily for increased blood sugars. He will see you back in 2 weeks for labs, treatment and follow up.   Thank you for choosing [place] at [facility] to provide your oncology and hematology care.  To afford each patient quality time with our provider, please arrive at least 15 minutes before your scheduled appointment time.   If you have a lab appointment with the [facility] please come in thru the  Main Entrance and check in at the main information desk  You need to re-schedule your appointment should you arrive 10 or more minutes late.  We strive to give you quality time with our providers, and arriving late affects you and other patients whose appointments are after yours.  Also, if you no show three or more times for appointments you may be dismissed from the clinic at the providers discretion.     Again, thank you for choosing [facility].  Our hope is that these requests will decrease the amount of time that you wait before being seen by our physicians.       _____________________________________________________________  Should you have questions after your visit to [facility], please contact our office at [phone] between the hours of 8:00 a.m. and 4:30 p.m.  Voicemails left after 4:00 p.m. will not be returned until the following business day.  For prescription refill requests, have your pharmacy contact our office and allow 72 hours.    Cancer Center Support Programs:   > Cancer Support Group  2nd Tuesday of the month 1pm-2pm, Journey Room

## 2020-01-28 NOTE — Assessment & Plan Note (Signed)
1.  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  PD-L1 CPS score is 5.  HER-2 by FISH is pending. -2 cycles of FOLFOX and nivolumab on 12/31/2019 and 01/14/2020. -He did not experience any immunotherapy related side effects. -We reviewed his labs.  White count and platelet count is normal.  LFTs are normal. -She will proceed with his cycle 3 today.  2.  Nutrition: -PEG tube was placed on 12/24/2019.  He is taking in 6 cans of Osmolite.  He is also drinking 2 to 3 cans of high-calorie boost. -He gained 5 pounds. -He also started eating some soft foods once a day.  3.  Diabetes: -His blood sugar is elevated at 323.  He is on Metformin XR 500 mg daily. -I will increase Metformin to 1000 mg daily.  4.  Bone metastasis: -We talked about starting him on denosumab.  He had dental work-up done and a tooth filling done on 01/22/2020. -We will wait until next cycle prior to start of denosumab.  5.  Anxiety: -He is using Xanax 0.5 mg as needed.  He is taking it twice weekly.

## 2020-01-30 ENCOUNTER — Inpatient Hospital Stay (HOSPITAL_COMMUNITY): Payer: Medicare HMO

## 2020-01-30 ENCOUNTER — Other Ambulatory Visit: Payer: Self-pay

## 2020-01-30 VITALS — BP 92/54 | HR 55 | Temp 97.3°F | Resp 18

## 2020-01-30 DIAGNOSIS — C160 Malignant neoplasm of cardia: Secondary | ICD-10-CM

## 2020-01-30 DIAGNOSIS — Z95828 Presence of other vascular implants and grafts: Secondary | ICD-10-CM

## 2020-01-30 DIAGNOSIS — Z5111 Encounter for antineoplastic chemotherapy: Secondary | ICD-10-CM | POA: Diagnosis not present

## 2020-01-30 MED ORDER — SODIUM CHLORIDE 0.9% FLUSH
10.0000 mL | INTRAVENOUS | Status: DC | PRN
  Administered 2020-01-30: 10 mL

## 2020-01-30 MED ORDER — HEPARIN SOD (PORK) LOCK FLUSH 100 UNIT/ML IV SOLN
500.0000 [IU] | Freq: Once | INTRAVENOUS | Status: AC | PRN
  Administered 2020-01-30: 500 [IU]

## 2020-01-30 NOTE — Patient Instructions (Signed)
[facility] Discharge Instructions for Patients Receiving Chemotherapy  Today you received the following chemotherapy agents   To help prevent nausea and vomiting after your treatment, we encourage you to take your nausea medication   If you develop nausea and vomiting that is not controlled by your nausea medication, call the clinic.   BELOW ARE SYMPTOMS THAT SHOULD BE REPORTED IMMEDIATELY:  *FEVER GREATER THAN 100.5 F  *CHILLS WITH OR WITHOUT FEVER  NAUSEA AND VOMITING THAT IS NOT CONTROLLED WITH YOUR NAUSEA MEDICATION  *UNUSUAL SHORTNESS OF BREATH  *UNUSUAL BRUISING OR BLEEDING  TENDERNESS IN MOUTH AND THROAT WITH OR WITHOUT PRESENCE OF ULCERS  *URINARY PROBLEMS  *BOWEL PROBLEMS  UNUSUAL RASH Items with * indicate a potential emergency and should be followed up as soon as possible.  Feel free to call the clinic should you have any questions or concerns. The clinic phone number is [phone].  Please show the CHEMO ALERT CARD at check-in to the Emergency Department and triage nurse.   

## 2020-01-30 NOTE — Progress Notes (Signed)
Nutrition Follow-up:  Patient with stage IV esophageal adenocarcinoma with mets to bone.  Patient receiving folfox and opdivo.  Met with patient following pump removal. Patient reports that he is eating soft foods.  Was able to eat chicken, hamburger patty, eggs, salmon, soft cooked veggies, mashed potatoes.  Reports that he is chopping foods well, taking small bites and chewing well. Has not been drinking as much oral nutrition supplements as he is getting tired of them.  Reports bowels are moving after taking miralax yesterday.  Drinking fluids orally, water, gingerale, sundrop.    Reports that he is taking on average 6 cartons of osmolite 1.5 daily via feeding tube.  Flushing with 60ml before and 120ml after.    Medications: reviewed  Labs: reviewed  Anthropometrics:   Weight 212 lb 12.8 oz increased from 207 lb on 2/10.     Estimated Energy Needs  Kcals: 2820-3200 calories Protein: 141-160 g Fluid: > 2.8 L  NUTRITION DIAGNOSIS: Inadequate oral intake improving, relying on feeding tube for majority of nutrition   INTERVENTION:  Discussed option of reducing tube feeding with improved oral intake.  Patient to monitor oral intake, weight and adjust tube feeding as needed.   Patient to continue to eat soft, moist foods orally as tolerated.   Patient has contact information    MONITORING, EVALUATION, GOAL: Patient will consume adequate calories and protein to meet nutritional needs and maintain weight   NEXT VISIT: March 12 after pump removal  Joli B. Allen, RD, LDN Registered Dietitian [phone] (pager)     

## 2020-01-30 NOTE — Progress Notes (Signed)
Timothy Oconnor presented for Pump D/C  And port flush. Portacath located in the right chest wall. Clean, Dry and Intact Good blood return present. Portacath flushed with 26ml NS and 500U/58ml Heparin per protocol and needle removed intact. Procedure without incident. Patient tolerated procedure well.

## 2020-02-11 ENCOUNTER — Inpatient Hospital Stay (HOSPITAL_COMMUNITY): Payer: Medicare HMO | Admitting: Hematology

## 2020-02-11 ENCOUNTER — Inpatient Hospital Stay (HOSPITAL_COMMUNITY): Payer: Medicare HMO

## 2020-02-11 ENCOUNTER — Inpatient Hospital Stay (HOSPITAL_COMMUNITY): Payer: Medicare HMO | Attending: Hematology

## 2020-02-11 ENCOUNTER — Other Ambulatory Visit: Payer: Self-pay

## 2020-02-11 VITALS — BP 114/61 | HR 60 | Resp 18

## 2020-02-11 DIAGNOSIS — Z79899 Other long term (current) drug therapy: Secondary | ICD-10-CM | POA: Diagnosis not present

## 2020-02-11 DIAGNOSIS — Z5111 Encounter for antineoplastic chemotherapy: Secondary | ICD-10-CM | POA: Diagnosis not present

## 2020-02-11 DIAGNOSIS — C160 Malignant neoplasm of cardia: Secondary | ICD-10-CM | POA: Diagnosis not present

## 2020-02-11 DIAGNOSIS — C7951 Secondary malignant neoplasm of bone: Secondary | ICD-10-CM | POA: Insufficient documentation

## 2020-02-11 DIAGNOSIS — Z95828 Presence of other vascular implants and grafts: Secondary | ICD-10-CM

## 2020-02-11 LAB — COMPREHENSIVE METABOLIC PANEL
ALT: 30 U/L (ref 0–44)
AST: 22 U/L (ref 15–41)
Albumin: 3.5 g/dL (ref 3.5–5.0)
Alkaline Phosphatase: 46 U/L (ref 38–126)
Anion gap: 7 (ref 5–15)
BUN: 15 mg/dL (ref 8–23)
CO2: 27 mmol/L (ref 22–32)
Calcium: 8.9 mg/dL (ref 8.9–10.3)
Chloride: 103 mmol/L (ref 98–111)
Creatinine, Ser: 0.75 mg/dL (ref 0.61–1.24)
GFR calc Af Amer: >60 mL/min (ref >60)
GFR calc non Af Amer: >60 mL/min (ref >60)
Glucose, Bld: 307 mg/dL — ABNORMAL HIGH (ref 70–99)
Potassium: 4.1 mmol/L (ref 3.5–5.1)
Sodium: 137 mmol/L (ref 135–145)
Total Bilirubin: 0.7 mg/dL (ref 0.3–1.2)
Total Protein: 6.1 g/dL — ABNORMAL LOW (ref 6.5–8.1)

## 2020-02-11 LAB — CBC WITH DIFFERENTIAL/PLATELET
Abs Immature Granulocytes: 0.02 10*3/uL (ref 0.00–0.07)
Basophils Absolute: 0 10*3/uL (ref 0.0–0.1)
Basophils Relative: 1 %
Eosinophils Absolute: 0.1 10*3/uL (ref 0.0–0.5)
Eosinophils Relative: 3 %
HCT: 36.3 % — ABNORMAL LOW (ref 39.0–52.0)
Hemoglobin: 12 g/dL — ABNORMAL LOW (ref 13.0–17.0)
Immature Granulocytes: 1 %
Lymphocytes Relative: 21 %
Lymphs Abs: 0.9 10*3/uL (ref 0.7–4.0)
MCH: 32.2 pg (ref 26.0–34.0)
MCHC: 33.1 g/dL (ref 30.0–36.0)
MCV: 97.3 fL (ref 80.0–100.0)
Monocytes Absolute: 0.4 10*3/uL (ref 0.1–1.0)
Monocytes Relative: 11 %
Neutro Abs: 2.6 10*3/uL (ref 1.7–7.7)
Neutrophils Relative %: 63 %
Platelets: 177 10*3/uL (ref 150–400)
RBC: 3.73 MIL/uL — ABNORMAL LOW (ref 4.22–5.81)
RDW: 15.2 % (ref 11.5–15.5)
WBC: 4.1 10*3/uL (ref 4.0–10.5)
nRBC: 0 % (ref 0.0–0.2)

## 2020-02-11 LAB — TSH: TSH: 2.411 u[IU]/mL (ref 0.350–4.500)

## 2020-02-11 MED ORDER — SODIUM CHLORIDE 0.9 % IV SOLN: Freq: Once | INTRAVENOUS | Status: AC

## 2020-02-11 MED ORDER — SODIUM CHLORIDE 0.9 % IV SOLN
10.0000 mg | Freq: Once | INTRAVENOUS | Status: AC
  Administered 2020-02-11: 10 mg via INTRAVENOUS
  Filled 2020-02-11: qty 10

## 2020-02-11 MED ORDER — OXALIPLATIN CHEMO INJECTION 100 MG/20ML
68.0000 mg/m2 | Freq: Once | INTRAVENOUS | Status: AC
  Administered 2020-02-11: 11:00:00 150 mg via INTRAVENOUS
  Filled 2020-02-11: qty 20

## 2020-02-11 MED ORDER — DEXTROSE 5 % IV SOLN: Freq: Once | INTRAVENOUS | Status: AC

## 2020-02-11 MED ORDER — SODIUM CHLORIDE 0.9 % IV SOLN
240.0000 mg | Freq: Once | INTRAVENOUS | Status: AC
  Administered 2020-02-11: 240 mg via INTRAVENOUS
  Filled 2020-02-11: qty 24

## 2020-02-11 MED ORDER — LEUCOVORIN CALCIUM INJECTION 350 MG
400.0000 mg/m2 | Freq: Once | INTRAVENOUS | Status: AC
  Administered 2020-02-11: 868 mg via INTRAVENOUS
  Filled 2020-02-11: qty 35

## 2020-02-11 MED ORDER — FLUOROURACIL CHEMO INJECTION 2.5 GM/50ML
400.0000 mg/m2 | Freq: Once | INTRAVENOUS | Status: AC
  Administered 2020-02-11: 850 mg via INTRAVENOUS
  Filled 2020-02-11: qty 17

## 2020-02-11 MED ORDER — SODIUM CHLORIDE 0.9 % IV SOLN
2310.0000 mg/m2 | INTRAVENOUS | Status: DC
  Administered 2020-02-11: 5000 mg via INTRAVENOUS
  Filled 2020-02-11: qty 100

## 2020-02-11 MED ORDER — METFORMIN HCL ER (OSM) 1000 MG PO TB24: 2000.0000 mg | ORAL_TABLET | Freq: Every day | ORAL | 3 refills | Status: DC

## 2020-02-11 MED ORDER — PALONOSETRON HCL INJECTION 0.25 MG/5ML
0.2500 mg | Freq: Once | INTRAVENOUS | Status: AC
  Administered 2020-02-11: 0.25 mg via INTRAVENOUS
  Filled 2020-02-11: qty 5

## 2020-02-11 NOTE — Patient Instructions (Signed)
[facility] Discharge Instructions for Patients Receiving Chemotherapy  Today you received the following chemotherapy agents   To help prevent nausea and vomiting after your treatment, we encourage you to take your nausea medication   If you develop nausea and vomiting that is not controlled by your nausea medication, call the clinic.   BELOW ARE SYMPTOMS THAT SHOULD BE REPORTED IMMEDIATELY:  *FEVER GREATER THAN 100.5 F  *CHILLS WITH OR WITHOUT FEVER  NAUSEA AND VOMITING THAT IS NOT CONTROLLED WITH YOUR NAUSEA MEDICATION  *UNUSUAL SHORTNESS OF BREATH  *UNUSUAL BRUISING OR BLEEDING  TENDERNESS IN MOUTH AND THROAT WITH OR WITHOUT PRESENCE OF ULCERS  *URINARY PROBLEMS  *BOWEL PROBLEMS  UNUSUAL RASH Items with * indicate a potential emergency and should be followed up as soon as possible.  Feel free to call the clinic should you have any questions or concerns. The clinic phone number is [phone].  Please show the CHEMO ALERT CARD at check-in to the Emergency Department and triage nurse.   

## 2020-02-11 NOTE — Progress Notes (Signed)
Patient has been assessed, vital signs and labs have been reviewed by Dr. Katragadda. ANC, Creatinine, LFTs, and Platelets are within treatment parameters per Dr. Katragadda. The patient is good to proceed with treatment at this time.  

## 2020-02-11 NOTE — Progress Notes (Signed)
Patient presents today for treatment and follow up visit with Dr. Delton Coombes. Labs drawn and pending. Patient has complaints of patches of dry skin on his left arm, right shoulder and right back. Patient denies any itching. Patient states he applied Neosporin on the 3 places and they are getting better per patient's words. Patient instructed to show Dr. Delton Coombes at the office visit. Patient has no complaints of pain today. MAR reviewed. Vital signs within parameters for treatment.    Message [facility]/ Dr. Delton Coombes proceed with treatment. Labs reviewed by MD. BS today 307. Labs within parameters for treatment.   Treatment given today per MD orders. Tolerated infusion without adverse affects. Vital signs stable. No complaints at this time. Discharged from clinic ambulatory. F/U with [facility] as scheduled.

## 2020-02-11 NOTE — Assessment & Plan Note (Addendum)
1.  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  PD-L1 CPS score-5.  HER-2 by FISH pending. -3 cycles of FOLFOX and nivolumab from 12/31/2019 through 01/28/2020 (ox 68 mg/m2). -No immunotherapy related side effects. -He had cold sensitivity lasted first week.  Denies any tingling or numbness in extremities. -He started eating small bites of food.  This is a good sign. -CBC shows white count 4.1 with 63% neutrophils.  Platelets are normal.  LFTs are normal. -She will proceed with cycle 4 today.  We will reevaluate him in 2 weeks.  2.  Nutrition: -PEG tube was placed on 12/24/2019.  He is taking in 2-2-2 cans of Osmolite. -He is also eating various foods including hamburger, chicken and fish.  He is avoiding breads.  He is eating in small bites.  When he eats food, he cuts back on 2 4 cans of Osmolite.  3.  Diabetes: -His blood sugar today 307. -We have increased his Metformin to 1000 mg daily at last visit.  I will increase it to 2000 mg daily.  4.  Bone metastasis: -He had tooth pulled on 01/22/2020.  I plan to start denosumab at next visit.  We discussed side effects.  5.  Anxiety: -Uses Xanax 0.5 mg as needed.  6.  Hypertension: -His blood pressure today is 91/54.  He reports lightheadedness in the mornings when he stands up. -He is taking Lopressor 25 mg twice daily.  He is also on losartan.  I have told him to hold losartan at this time.

## 2020-02-11 NOTE — Patient Instructions (Addendum)
[place] at [facility] Discharge Instructions  You were seen today by Dr. Delton Coombes. He went over your recent lab results. Start taking 2,000 mg of Metformin a day. He will see you back in 2 weeks for labs, treatment and follow up. Stop taking the Losartan due to your dizziness and lower blood pressure.  Thank you for choosing [place] at [facility] to provide your oncology and hematology care.  To afford each patient quality time with our provider, please arrive at least 15 minutes before your scheduled appointment time.   If you have a lab appointment with the [facility] please come in thru the  Main Entrance and check in at the main information desk  You need to re-schedule your appointment should you arrive 10 or more minutes late.  We strive to give you quality time with our providers, and arriving late affects you and other patients whose appointments are after yours.  Also, if you no show three or more times for appointments you may be dismissed from the clinic at the providers discretion.     Again, thank you for choosing [facility].  Our hope is that these requests will decrease the amount of time that you wait before being seen by our physicians.       _____________________________________________________________  Should you have questions after your visit to [facility], please contact our office at [phone] between the hours of 8:00 a.m. and 4:30 p.m.  Voicemails left after 4:00 p.m. will not be returned until the following business day.  For prescription refill requests, have your pharmacy contact our office and allow 72 hours.    Cancer Center Support Programs:   > Cancer Support Group  2nd Tuesday of the month 1pm-2pm, Journey Room

## 2020-02-11 NOTE — Progress Notes (Signed)
Timothy Oconnor, [place] [address]   CLINIC:  Medical Oncology/Hematology  PCP:  Timothy Percy, MD [place] [address] [phone]   REASON FOR VISIT:  Follow-up for stage IV GE junction adenocarcinoma.  CURRENT THERAPY: FOLFOX and Opdivo.  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[place])  12/24/2019 Initial Diagnosis   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[place])   12/29/2019 Cancer Staging   Staging form: Esophagus - Adenocarcinoma, AJCC 8th Edition - Clinical stage from 12/29/2019: Stage IVB (cTX, cN3, cM1) - Signed by Derek Jack, MD on 12/29/2019   12/31/2019 -  Chemotherapy   The patient had palonosetron (ALOXI) injection 0.25 mg, 0.25 mg, Intravenous,  Once, 4 of 6 cycles Administration: 0.25 mg (12/31/2019), 0.25 mg (01/14/2020), 0.25 mg (01/28/2020), 0.25 mg (02/11/2020) leucovorin 868 mg in dextrose 5 % 250 mL infusion, 400 mg/m2 = 868 mg, Intravenous,  Once, 4 of 6 cycles Administration: 868 mg (12/31/2019), 868 mg (01/14/2020), 868 mg (01/28/2020), 868 mg (02/11/2020) oxaliplatin (ELOXATIN) 150 mg in dextrose 5 % 500 mL chemo infusion, 68 mg/m2 = 150 mg (80 % of original dose 85 mg/m2), Intravenous,  Once, 4 of 6 cycles Dose modification: 68 mg/m2 (80 % of original dose 85 mg/m2, Cycle 1, Reason: Other (see comments), Comment: diabetic neuropathy) Administration: 150 mg (12/31/2019), 150 mg (01/14/2020), 150 mg (01/28/2020), 150 mg (02/11/2020) fluorouracil (ADRUCIL) chemo injection 850 mg, 400 mg/m2 = 850 mg, Intravenous,  Once, 4 of 6 cycles Administration: 850 mg (12/31/2019), 850 mg (01/14/2020), 850 mg (01/28/2020), 850 mg (02/11/2020) fluorouracil (ADRUCIL) 5,000 mg in sodium chloride 0.9 % 150 mL chemo infusion, 2,310 mg/m2 = 5,200 mg, Intravenous, 1 Day/Dose, 4 of 6 cycles Administration: 5,000 mg  (12/31/2019), 5,000 mg (01/14/2020), 5,000 mg (01/28/2020), 5,000 mg (02/11/2020) nivolumab (OPDIVO) 240 mg in sodium chloride 0.9 % 100 mL chemo infusion, 240 mg (100 % of original dose 240 mg), Intravenous, Once, 4 of 6 cycles Dose modification: 240 mg (original dose 240 mg, Cycle 1, Reason: Provider Judgment) Administration: 240 mg (12/31/2019), 240 mg (01/14/2020), 240 mg (01/28/2020), 240 mg (02/11/2020)  for chemotherapy treatment.       CANCER STAGING: Cancer Staging Adenocarcinoma of gastroesophageal junction [facility]) Staging form: Esophagus - Adenocarcinoma, AJCC 8th Edition - Clinical stage from 12/29/2019: Stage IVB (cTX, cN3, cM1) - Signed by Derek Jack, MD on 12/29/2019    INTERVAL HISTORY:  Timothy Oconnor 69 y.o. male seen for follow-up of GE junction adenocarcinoma.  He received cycle 3 on 01/28/2020.  He had cold sensitivity lasting for 1 week.  Denied any tingling or numbness in extremities.  He reports lightheadedness in the mornings when he stands up.  Denies any diarrhea.  Denies any cough or shortness of breath.  He started eating foods including hamburgers, chicken and fish and small bites and avoiding breads.  He is also using 6 cans of Osmolite.   REVIEW OF SYSTEMS:  Review of Systems  Neurological: Positive for dizziness and numbness.  All other systems reviewed and are negative.    PAST MEDICAL/SURGICAL HISTORY:  Past Medical History:  Diagnosis Date  . 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[place])   . Anxiety   . Diabetes ([place])   . Heart attack ([place] [place])   . Heart disease   . High cholesterol   . Hyperplasia of prostate   . Hypertension   .  Low back pain   . Port-A-Cath in place 12/30/2019  . Tremor    Past Surgical History:  Procedure Laterality Date  . arterectomy     1993  . BIOPSY  12/01/2019   Procedure: BIOPSY;  Surgeon: Rogene Houston, MD;  Location: [facility] ENDO SUITE;  Service: Endoscopy;;  esophagus  . ESOPHAGOGASTRODUODENOSCOPY (EGD) WITH  PROPOFOL N/A 12/01/2019   Procedure: ESOPHAGOGASTRODUODENOSCOPY (EGD) WITH PROPOFOL;  Surgeon: Rogene Houston, MD;  Location: [facility] ENDO SUITE;  Service: Endoscopy;  Laterality: N/A;  . ESOPHAGOGASTRODUODENOSCOPY (EGD) WITH PROPOFOL N/A 12/24/2019   Procedure: ESOPHAGOGASTRODUODENOSCOPY (EGD) WITH PROPOFOL;  Surgeon: Aviva Signs, MD;  Location: [facility] ORS;  Service: General;  Laterality: N/A;  . Heart stents     2002, 2004, 2006  . PEG PLACEMENT N/A 12/24/2019   Procedure: PERCUTANEOUS ENDOSCOPIC GASTROSTOMY (PEG) PLACEMENT;  Surgeon: Aviva Signs, MD;  Location: [facility] ORS;  Service: General;  Laterality: N/A;  . PORTACATH PLACEMENT Left 12/24/2019   Procedure: INSERTION PORT-A-CATH;  Surgeon: Aviva Signs, MD;  Location: [facility] ORS;  Service: General;  Laterality: Left;     SOCIAL HISTORY:  Social History   Socioeconomic History  . Marital status: Married    Spouse name: Not on file  . Number of children: 2  . Years of education: 72  . Highest education level: Not on file  Occupational History  . Occupation: [employer]    Comment: Delivers medication  Tobacco Use  . Smoking status: Former Smoker    Packs/day: 1.00    Years: 50.00    Pack years: 50.00    Quit date: 12/21/2017    Years since quitting: 2.1  . Smokeless tobacco: Never Used  . Tobacco comment: Quit 2017  Substance and Sexual Activity  . Alcohol use: Yes    Alcohol/week: 0.0 standard drinks    Comment: 3 drinks per week  . Drug use: No  . Sexual activity: Yes  Other Topics Concern  . Not on file  Social History Narrative   Lives at home with his wife.   Right-handed.   2 diet cokes per day.   Social Determinants of Health   Financial Resource Strain: Low Risk   . Difficulty of Paying Living Expenses: Not hard at all  Food Insecurity: No Food Insecurity  . Worried About Charity fundraiser in the Last Year: Never true  . Ran Out of Food in the Last Year: Never true  Transportation Needs: No Transportation Needs  .  Lack of Transportation (Medical): No  . Lack of Transportation (Non-Medical): No  Physical Activity: Insufficiently Active  . Days of Exercise per Week: 2 days  . Minutes of Exercise per Session: 30 min  Stress: No Stress Concern Present  . Feeling of Stress : Only a little  Social Connections: Not Isolated  . Frequency of Communication with Friends and Family: More than three times a week  . Frequency of Social Gatherings with Friends and Family: More than three times a week  . Attends Religious Services: 1 to 4 times per year  . Active Member of Clubs or Organizations: No  . Attends Archivist Meetings: 1 to 4 times per year  . Marital Status: Married  Human resources officer Violence: Not At Risk  . Fear of Current or Ex-Partner: No  . Emotionally Abused: No  . Physically Abused: No  . Sexually Abused: No    FAMILY HISTORY:  Family History  Problem Relation Age of Onset  . Alzheimer's disease Mother   .  Diabetes Father   . Heart disease Father   . Stroke Father   . Heart disease Brother   . Colon cancer Brother   . Alzheimer's disease Sister     CURRENT MEDICATIONS:  Outpatient Encounter Medications as of 02/11/2020  Medication Sig Note  . ascorbic acid (VITAMIN C) 500 MG tablet Take 500 mg by mouth 2 (two) times a week.    Marland Kitchen aspirin EC 81 MG tablet Take 81 mg by mouth daily.    Marland Kitchen atorvastatin (LIPITOR) 80 MG tablet Take 80 mg by mouth every evening.    . ezetimibe (ZETIA) 10 MG tablet Take 10 mg by mouth daily.    Marland Kitchen FLUOROURACIL IV Inject into the vein every 14 (fourteen) days.   . folic acid (FOLVITE) 010 MCG tablet Take 800 mcg by mouth daily.    . isosorbide mononitrate (IMDUR) 30 MG 24 hr tablet Take 30 mg by mouth daily.    Marland Kitchen LEUCOVORIN CALCIUM IV Inject into the vein every 14 (fourteen) days.   Marland Kitchen losartan (COZAAR) 50 MG tablet Take 25 mg by mouth daily.   . metoprolol tartrate (LOPRESSOR) 25 MG tablet Take 0.5 tablets (12.5 mg total) by mouth 2 (two) times  daily. (Patient taking differently: Take 25 mg by mouth 2 (two) times daily. )   . Multiple Vitamin (MULTI-VITAMINS) TABS Take 1 tablet by mouth daily.    Marland Kitchen NIVOLUMAB IV Inject into the vein every 14 (fourteen) days.   . Nutritional Supplements (FEEDING SUPPLEMENT, OSMOLITE 1.5 CAL,) LIQD Give 1 1/2 cartons of osmolite 4 times per day via PEG.  Flush with 46m of water before and after. Drink or give via tube additional 240 ml water TID. Provides 2130 calories, 89 g protein and 22862mfree water per day; meets 100% estimated needs.  Send formula and bolus supplies. 01/08/2020: Patient states that he is using 6 times daily.  . OXALIPLATIN IV Inject into the vein every 14 (fourteen) days.   . tamsulosin (FLOMAX) 0.4 MG CAPS capsule Take 0.4 mg by mouth daily.    . Marland KitchenLPRAZolam (XANAX) 0.5 MG tablet Take 1 tablet (0.5 mg total) by mouth 2 (two) times daily as needed for anxiety. (Patient not taking: Reported on 02/11/2020) 01/08/2020: As needed and only used once.  . Glucosamine HCl (GLUCOSAMINE PO) Take 120 mg by mouth 2 (two) times daily. 12/19/2019: On hold  . HYDROcodone-acetaminophen (HYCET) 7.5-325 mg/15 ml solution Take 15 mLs by mouth every 4 (four) hours as needed for moderate pain. (Patient not taking: Reported on 01/07/2020)   . lidocaine-prilocaine (EMLA) cream Apply a dime-sized amount to port a cath site and cover with plastic wrap 1 hour prior to chemotherapy appointments (Patient not taking: Reported on 01/07/2020)   . metformin (FORTAMET) 1000 MG (OSM) 24 hr tablet Take 2 tablets (2,000 mg total) by mouth daily with breakfast.   . prochlorperazine (COMPAZINE) 10 MG tablet Take 1 tablet (10 mg total) by mouth every 6 (six) hours as needed (Nausea or vomiting). (Patient not taking: Reported on 02/11/2020) 01/08/2020: Patient is taking as needed.  . [DISCONTINUED] losartan (COZAAR) 25 MG tablet Take 25 mg by mouth daily.    . [DISCONTINUED] metFORMIN (FORTAMET) 1000 MG (OSM) 24 hr tablet Take 1 tablet  (1,000 mg total) by mouth daily with breakfast. (Patient taking differently: Take 1,000 mg by mouth 2 (two) times daily. )   . [DISCONTINUED] metFORMIN (GLUCOPHAGE-XR) 500 MG 24 hr tablet Take 1,000 mg by mouth every morning.  No facility-administered encounter medications on file as of 02/11/2020.    ALLERGIES:  No Known Allergies   PHYSICAL EXAM:  ECOG Performance status: 1  Vitals:   02/11/20 0818  BP: (!) 91/54  Pulse: (!) 56  Resp: 18  Temp: 97.8 F (36.6 C)  SpO2: 97%   Filed Weights   02/11/20 0818  Weight: 211 lb 11.2 oz (96 kg)    Physical Exam Vitals reviewed.  Constitutional:      Appearance: Normal appearance.  Cardiovascular:     Rate and Rhythm: Normal rate and regular rhythm.     Heart sounds: Normal heart sounds.  Pulmonary:     Effort: Pulmonary effort is normal.     Breath sounds: Normal breath sounds.  Abdominal:     General: There is no distension.     Palpations: Abdomen is soft.  Skin:    General: Skin is warm.  Neurological:     General: No focal deficit present.     Mental Status: He is alert and oriented to person, place, and time.  Psychiatric:        Mood and Affect: Mood normal.        Behavior: Behavior normal.      LABORATORY DATA:  I have reviewed the labs as listed.  CBC    Component Value Date/Time   WBC 4.1 02/11/2020 0800   RBC 3.73 (L) 02/11/2020 0800   HGB 12.0 (L) 02/11/2020 0800   HCT 36.3 (L) 02/11/2020 0800   PLT 177 02/11/2020 0800   MCV 97.3 02/11/2020 0800   MCH 32.2 02/11/2020 0800   MCHC 33.1 02/11/2020 0800   RDW 15.2 02/11/2020 0800   LYMPHSABS 0.9 02/11/2020 0800   MONOABS 0.4 02/11/2020 0800   EOSABS 0.1 02/11/2020 0800   BASOSABS 0.0 02/11/2020 0800   CMP Latest Ref Rng & Units 02/11/2020 01/28/2020 01/14/2020  Glucose 70 - 99 mg/dL 307(H) 289(H) 246(H)  BUN 8 - 23 mg/dL _0 Creatinine 0.61 - 1.24 mg/dL 0.75 0.71 0.71  Sodium 135 - 145 mmol/L 137 138 137  Potassium 3.5 - 5.1 mmol/L 4.1  4.2 4.1  Chloride 98 - 111 mmol/L 103 102 100  CO2 22 - 32 mmol/L _1 Calcium 8.9 - 10.3 mg/dL 8.9 9.0 9.1  Total Protein 6.5 - 8.1 g/dL 6.1(L) 6.3(L) 6.6  Total Bilirubin 0.3 - 1.2 mg/dL 0.7 0.4 0.6  Alkaline Phos 38 - 126 U/L 46 51 49  AST 15 - 41 U/L _2 ALT 0 - 44 U/L 30 31 42       DIAGNOSTIC IMAGING:  I have independently reviewed the scans.     ASSESSMENT & PLAN:   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HCC) 1.  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  PD-L1 CPS score-5.  HER-2 by FISH pending. -3 cycles of FOLFOX and nivolumab from 12/31/2019 through 01/28/2020 (ox 68 mg/m2). -No immunotherapy related side effects. -He had cold sensitivity lasted first week.  Denies any tingling or numbness in extremities. -He started eating small bites of food.  This is a good sign. -CBC shows white count 4.1 with 63% neutrophils.  Platelets are normal.  LFTs are normal. -She will proceed with cycle 4 today.  We will reevaluate him in 2 weeks.  2.  Nutrition: -PEG tube was placed on 12/24/2019.  He is taking in 2-2-2 cans of Osmolite. -He is also eating various foods including hamburger,  chicken and fish.  He is avoiding breads.  He is eating in small bites.  When he eats food, he cuts back on 2 4 cans of Osmolite.  3.  Diabetes: -His blood sugar today 307. -We have increased his Metformin to 1000 mg daily at last visit.  I will increase it to 2000 mg daily.  4.  Bone metastasis: -He had tooth pulled on 01/22/2020.  I plan to start denosumab at next visit.  We discussed side effects.  5.  Anxiety: -Uses Xanax 0.5 mg as needed.  6.  Hypertension: -His blood pressure today is 91/54.  He reports lightheadedness in the mornings when he stands up. -He is taking Lopressor 25 mg twice daily.  He is also on losartan.  I have told him to hold losartan at this time.      Orders placed this encounter:  No orders of the defined  types were placed in this encounter.    Derek Jack, MD [place] [phone]

## 2020-02-13 ENCOUNTER — Encounter (HOSPITAL_COMMUNITY): Payer: Medicare HMO

## 2020-02-13 ENCOUNTER — Other Ambulatory Visit: Payer: Self-pay

## 2020-02-13 ENCOUNTER — Inpatient Hospital Stay (HOSPITAL_COMMUNITY): Payer: Medicare HMO

## 2020-02-13 ENCOUNTER — Ambulatory Visit (HOSPITAL_COMMUNITY): Payer: Medicare HMO

## 2020-02-13 VITALS — BP 102/52 | HR 58 | Temp 97.7°F | Resp 18

## 2020-02-13 DIAGNOSIS — Z5111 Encounter for antineoplastic chemotherapy: Secondary | ICD-10-CM | POA: Diagnosis not present

## 2020-02-13 DIAGNOSIS — C160 Malignant neoplasm of cardia: Secondary | ICD-10-CM

## 2020-02-13 DIAGNOSIS — Z95828 Presence of other vascular implants and grafts: Secondary | ICD-10-CM

## 2020-02-13 MED ORDER — HEPARIN SOD (PORK) LOCK FLUSH 100 UNIT/ML IV SOLN
500.0000 [IU] | Freq: Once | INTRAVENOUS | Status: AC | PRN
  Administered 2020-02-13: 500 [IU]

## 2020-02-13 MED ORDER — SODIUM CHLORIDE 0.9% FLUSH
10.0000 mL | INTRAVENOUS | Status: DC | PRN
  Administered 2020-02-13: 10 mL

## 2020-02-13 NOTE — Progress Notes (Signed)
Timothy Oconnor returns today for port de access and flush after 46 hr continous infusion of 40fu. Tolerated infusion without problems. Portacath located left chest wall was  deaccessed and flushed with 3ml NS and 500U/31ml Heparin and needle removed intact.  Procedure without incident. Patient tolerated procedure well.

## 2020-02-13 NOTE — Progress Notes (Addendum)
Nutrition Follow-up:  Patient with stage IV esophageal adenocarcinoma with mets to bone.  Patient receiving folfox and opdivo.    Spoke with patient via phone for nutrition follow-up.  Patient reports that he is able to eat more solid foods over the last 2 weeks and eating about 1 meal per day (ie tuna salad on thin bread with melted cheese, hamburger patty, chicken marsala, swedish meatballs).  Reports that when he eats well he cuts back on tube feeding to 4 cartons per day.     Medications: metformin increased  Labs: glucose 307  Anthropometrics:   Weight 211 lb 11.2 oz on 3/10 stable from 212 lb on 2/24   Estimated Energy Needs  Kcals: 2820-3200 Protein: 141-160 g Fluid: > 2.8 L  NUTRITION DIAGNOSIS: Inadequate oral intake improving    INTERVENTION:  Patient to continue to monitor po intake, weight and adjust tube feeding as needed. Discussed with patient option of changing formula to diabetic formula.  Patient wants to hold off at this time.  Hoping may not need as much formula in the future.  Continue good hydration orally.  Continue to eat soft, moist foods orally as tolerated Patient has contact information    MONITORING, EVALUATION, GOAL: Patient will consume adequate calories and protein to meet nutritional needs utilizing feeding tube and oral intake   NEXT VISIT: March 26th at pump removal  Burdette Gergely B. Zenia Resides, [place], [place] Registered Dietitian [phone] (pager)

## 2020-02-23 NOTE — Progress Notes (Signed)
..  Pharmacist Chemotherapy Monitoring - Follow Up Assessment    I verify that I have reviewed each item in the below checklist:  . Regimen for the patient is scheduled for the appropriate day and plan matches scheduled date. Marland Kitchen Appropriate non-routine labs are ordered dependent on drug ordered. . If applicable, additional medications reviewed and ordered per protocol based on lifetime cumulative doses and/or treatment regimen.   Plan for follow-up and/or issues identified: No . I-vent associated with next due treatment: No . MD and/or nursing notified: No  Wynona Neat 02/23/2020 1:45 PM

## 2020-02-25 ENCOUNTER — Encounter (HOSPITAL_COMMUNITY): Payer: Self-pay

## 2020-02-25 ENCOUNTER — Encounter (HOSPITAL_COMMUNITY): Payer: Self-pay | Admitting: Hematology

## 2020-02-25 ENCOUNTER — Inpatient Hospital Stay (HOSPITAL_COMMUNITY): Payer: Medicare HMO

## 2020-02-25 ENCOUNTER — Inpatient Hospital Stay (HOSPITAL_COMMUNITY): Payer: Medicare HMO | Admitting: Hematology

## 2020-02-25 ENCOUNTER — Other Ambulatory Visit: Payer: Self-pay

## 2020-02-25 VITALS — BP 109/56 | HR 52 | Temp 97.9°F | Resp 18

## 2020-02-25 DIAGNOSIS — C160 Malignant neoplasm of cardia: Secondary | ICD-10-CM

## 2020-02-25 DIAGNOSIS — Z95828 Presence of other vascular implants and grafts: Secondary | ICD-10-CM

## 2020-02-25 DIAGNOSIS — Z5111 Encounter for antineoplastic chemotherapy: Secondary | ICD-10-CM | POA: Diagnosis not present

## 2020-02-25 LAB — COMPREHENSIVE METABOLIC PANEL
ALT: 22 U/L (ref 0–44)
AST: 20 U/L (ref 15–41)
Albumin: 3.4 g/dL — ABNORMAL LOW (ref 3.5–5.0)
Alkaline Phosphatase: 43 U/L (ref 38–126)
Anion gap: 8 (ref 5–15)
BUN: 14 mg/dL (ref 8–23)
CO2: 25 mmol/L (ref 22–32)
Calcium: 8.6 mg/dL — ABNORMAL LOW (ref 8.9–10.3)
Chloride: 103 mmol/L (ref 98–111)
Creatinine, Ser: 0.74 mg/dL (ref 0.61–1.24)
GFR calc Af Amer: >60 mL/min (ref >60)
GFR calc non Af Amer: >60 mL/min (ref >60)
Glucose, Bld: 275 mg/dL — ABNORMAL HIGH (ref 70–99)
Potassium: 3.8 mmol/L (ref 3.5–5.1)
Sodium: 136 mmol/L (ref 135–145)
Total Bilirubin: 0.6 mg/dL (ref 0.3–1.2)
Total Protein: 6 g/dL — ABNORMAL LOW (ref 6.5–8.1)

## 2020-02-25 LAB — CBC WITH DIFFERENTIAL/PLATELET
Abs Immature Granulocytes: 0.01 10*3/uL (ref 0.00–0.07)
Basophils Absolute: 0 10*3/uL (ref 0.0–0.1)
Basophils Relative: 1 %
Eosinophils Absolute: 0.1 10*3/uL (ref 0.0–0.5)
Eosinophils Relative: 2 %
HCT: 35.2 % — ABNORMAL LOW (ref 39.0–52.0)
Hemoglobin: 11.5 g/dL — ABNORMAL LOW (ref 13.0–17.0)
Immature Granulocytes: 0 %
Lymphocytes Relative: 18 %
Lymphs Abs: 0.7 10*3/uL (ref 0.7–4.0)
MCH: 31.9 pg (ref 26.0–34.0)
MCHC: 32.7 g/dL (ref 30.0–36.0)
MCV: 97.8 fL (ref 80.0–100.0)
Monocytes Absolute: 0.6 10*3/uL (ref 0.1–1.0)
Monocytes Relative: 14 %
Neutro Abs: 2.7 10*3/uL (ref 1.7–7.7)
Neutrophils Relative %: 65 %
Platelets: 132 10*3/uL — ABNORMAL LOW (ref 150–400)
RBC: 3.6 MIL/uL — ABNORMAL LOW (ref 4.22–5.81)
RDW: 15.8 % — ABNORMAL HIGH (ref 11.5–15.5)
WBC: 4.1 10*3/uL (ref 4.0–10.5)
nRBC: 0 % (ref 0.0–0.2)

## 2020-02-25 MED ORDER — SODIUM CHLORIDE 0.9 % IV SOLN
240.0000 mg | Freq: Once | INTRAVENOUS | Status: AC
  Administered 2020-02-25: 240 mg via INTRAVENOUS
  Filled 2020-02-25: qty 24

## 2020-02-25 MED ORDER — FLUOROURACIL CHEMO INJECTION 2.5 GM/50ML
400.0000 mg/m2 | Freq: Once | INTRAVENOUS | Status: AC
  Administered 2020-02-25: 850 mg via INTRAVENOUS
  Filled 2020-02-25: qty 17

## 2020-02-25 MED ORDER — SODIUM CHLORIDE 0.9% FLUSH: 10.0000 mL | INTRAVENOUS | Status: DC | PRN

## 2020-02-25 MED ORDER — DEXTROSE 5 % IV SOLN: Freq: Once | INTRAVENOUS | Status: AC

## 2020-02-25 MED ORDER — OXALIPLATIN CHEMO INJECTION 100 MG/20ML
68.0000 mg/m2 | Freq: Once | INTRAVENOUS | Status: AC
  Administered 2020-02-25: 150 mg via INTRAVENOUS
  Filled 2020-02-25: qty 20

## 2020-02-25 MED ORDER — SODIUM CHLORIDE 0.9 % IV SOLN
10.0000 mg | Freq: Once | INTRAVENOUS | Status: AC
  Administered 2020-02-25: 10 mg via INTRAVENOUS
  Filled 2020-02-25: qty 10

## 2020-02-25 MED ORDER — SODIUM CHLORIDE 0.9 % IV SOLN: Freq: Once | INTRAVENOUS | Status: AC

## 2020-02-25 MED ORDER — PALONOSETRON HCL INJECTION 0.25 MG/5ML
0.2500 mg | Freq: Once | INTRAVENOUS | Status: AC
  Administered 2020-02-25: 0.25 mg via INTRAVENOUS
  Filled 2020-02-25: qty 5

## 2020-02-25 MED ORDER — LEUCOVORIN CALCIUM INJECTION 350 MG
400.0000 mg/m2 | Freq: Once | INTRAVENOUS | Status: AC
  Administered 2020-02-25: 868 mg via INTRAVENOUS
  Filled 2020-02-25: qty 43.4

## 2020-02-25 MED ORDER — HEPARIN SOD (PORK) LOCK FLUSH 100 UNIT/ML IV SOLN: 500.0000 [IU] | Freq: Once | INTRAVENOUS | Status: DC | PRN

## 2020-02-25 MED ORDER — SODIUM CHLORIDE 0.9 % IV SOLN
2310.0000 mg/m2 | INTRAVENOUS | Status: DC
  Administered 2020-02-25: 5000 mg via INTRAVENOUS
  Filled 2020-02-25: qty 100

## 2020-02-25 NOTE — Progress Notes (Signed)
Patient has been assessed, vital signs and labs have been reviewed by Dr. Katragadda. ANC, Creatinine, LFTs, and Platelets are within treatment parameters per Dr. Katragadda. The patient is good to proceed with treatment at this time.  

## 2020-02-25 NOTE — Progress Notes (Signed)
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  CURRENT THERAPY: FOLFOX and Opdivo.  BRIEF ONCOLOGIC HISTORY:  Oncology History  Esophageal cancer [facility])   Initial Diagnosis   Esophageal cancer (Timothy Oconnor)   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Timothy Oconnor)  12/24/2019 Initial Diagnosis   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Timothy Oconnor)   12/29/2019 Cancer Staging   Staging form: Esophagus - Adenocarcinoma, AJCC 8th Edition - Clinical stage from 12/29/2019: Stage IVB (cTX, cN3, cM1) - Signed by Timothy Jack, MD on 12/29/2019   12/31/2019 -  Chemotherapy   The patient had palonosetron (ALOXI) injection 0.25 mg, 0.25 mg, Intravenous,  Once, 5 of 6 cycles Administration: 0.25 mg (12/31/2019), 0.25 mg (01/14/2020), 0.25 mg (01/28/2020), 0.25 mg (02/11/2020), 0.25 mg (02/25/2020) leucovorin 868 mg in dextrose 5 % 250 mL infusion, 400 mg/m2 = 868 mg, Intravenous,  Once, 5 of 6 cycles Administration: 868 mg (12/31/2019), 868 mg (01/14/2020), 868 mg (01/28/2020), 868 mg (02/11/2020), 868 mg (02/25/2020) oxaliplatin (ELOXATIN) 150 mg in dextrose 5 % 500 mL chemo infusion, 68 mg/m2 = 150 mg (80 % of original dose 85 mg/m2), Intravenous,  Once, 5 of 6 cycles Dose modification: 68 mg/m2 (80 % of original dose 85 mg/m2, Cycle 1, Reason: Other (see comments), Comment: diabetic neuropathy) Administration: 150 mg (12/31/2019), 150 mg (01/14/2020), 150 mg (01/28/2020), 150 mg (02/11/2020), 150 mg (02/25/2020) fluorouracil (ADRUCIL) chemo injection 850 mg, 400 mg/m2 = 850 mg, Intravenous,  Once, 5 of 6 cycles Administration: 850 mg (12/31/2019), 850 mg (01/14/2020), 850 mg (01/28/2020), 850 mg (02/11/2020), 850 mg (02/25/2020) fluorouracil (ADRUCIL) 5,000 mg in sodium chloride 0.9 % 150 mL chemo infusion, 2,310 mg/m2 =  5,200 mg, Intravenous, 1 Day/Dose, 5 of 6 cycles Administration: 5,000 mg (12/31/2019), 5,000 mg (01/14/2020), 5,000 mg (01/28/2020), 5,000 mg (02/11/2020), 5,000 mg (02/25/2020) nivolumab (OPDIVO) 240 mg in sodium chloride 0.9 % 100 mL chemo infusion, 240 mg (100 % of original dose 240 mg), Intravenous, Once, 5 of 6 cycles Dose modification: 240 mg (original dose 240 mg, Cycle 1, Reason: Provider Judgment) Administration: 240 mg (12/31/2019), 240 mg (01/14/2020), 240 mg (01/28/2020), 240 mg (02/11/2020), 240 mg (02/25/2020)  for chemotherapy treatment.       CANCER STAGING: Cancer Staging Adenocarcinoma of gastroesophageal junction [facility]) Staging form: Esophagus - Adenocarcinoma, AJCC 8th Edition - Clinical stage from 12/29/2019: Stage IVB (cTX, cN3, cM1) - Signed by Timothy Jack, MD on 12/29/2019    INTERVAL HISTORY:  Timothy Oconnor 69 y.o. male seen for follow-up and toxicity assessment for GE junction adenocarcinoma.  Cycle 4 of chemotherapy was on 02/11/2020.  Denies any diarrhea.  Denies any skin rashes.  Reports dry skin and uses moisturizing cream.  Denies any shortness of breath.  Reports eating all sorts of foods.  He is cutting back on tube feeds.  Reports cold sensitivity in the fingertips.  No constant tingling or numbness.  Reports taking Xanax for sleep problems.   REVIEW OF SYSTEMS:  Review of Systems  HENT:   Positive for trouble swallowing.   Neurological: Positive for numbness.  All other systems reviewed and are negative.    PAST MEDICAL/SURGICAL HISTORY:  Past Medical History:  Diagnosis Date  . 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[place])   . Anxiety   . Diabetes ([place])   . Heart attack ([place])   .  Heart disease   . High cholesterol   . Hyperplasia of prostate   . Hypertension   . Low back pain   . Port-A-Cath in place 12/30/2019  . Tremor    Past Surgical History:  Procedure Laterality Date  . arterectomy     1993  . BIOPSY  12/01/2019   Procedure:  BIOPSY;  Surgeon: Timothy Houston, MD;  Location: [facility];  Service: Endoscopy;;  esophagus  . ESOPHAGOGASTRODUODENOSCOPY (EGD) WITH PROPOFOL N/A 12/01/2019   Procedure: ESOPHAGOGASTRODUODENOSCOPY (EGD) WITH PROPOFOL;  Surgeon: Timothy Houston, MD;  Location: [facility] END[facility];  Service: Endoscopy;  Laterality: N/A;  . ESOPHAGOGASTRODUODENOSCOPY (EGD) WITH PROPOFOL N/A 12/24/2019   Procedure: ESOPHAGOGASTRODUODENOSCOPY (EGD) WITH PROPOFOL;  Surgeon: Timothy Signs, MD;  Location: [facility] ORS;  Service: General;  Laterality: N/A;  . Heart stents     2002, 2004, 2006  . PEG PLACEMENT N/A 12/24/2019   Procedure: PERCUTANEOUS ENDOSCOPIC GASTROSTOMY (PEG) PLACEMENT;  Surgeon: Timothy Signs, MD;  Location: [facility] ORS;  Service: General;  Laterality: N/A;  . PORTACATH PLACEMENT Left 12/24/2019   Procedure: INSERTION PORT-A-CATH;  Surgeon: Timothy Signs, MD;  Location: [facility] ORS;  Service: General;  Laterality: Left;     SOCIAL HISTORY:  Social History   Socioeconomic History  . Marital status: Married    Spouse name: Not on file  . Number of children: 2  . Years of education: 53  . Highest education level: Not on file  Occupational History  . Occupation: Timothy Oconnor    Comment: Delivers medication  Tobacco Use  . Smoking status: Former Smoker    Packs/day: 1.00    Years: 50.00    Pack years: 50.00    Quit date: 12/21/2017    Years since quitting: 2.1  . Smokeless tobacco: Never Used  . Tobacco comment: Quit 2017  Substance and Sexual Activity  . Alcohol use: Yes    Alcohol/week: 0.0 standard drinks    Comment: 3 drinks per week  . Oconnor use: No  . Sexual activity: Yes  Other Topics Concern  . Not on file  Social History Narrative   Lives at home with his wife.   Right-handed.   2 diet cokes per day.   Social Determinants of Health   Financial Resource Strain: Low Risk   . Difficulty of Paying Living Expenses: Not hard at all  Food Insecurity: No Food Insecurity  . Worried About Paediatric nurse in the Last Year: Never true  . Ran Out of Food in the Last Year: Never true  Transportation Needs: No Transportation Needs  . Lack of Transportation (Medical): No  . Lack of Transportation (Non-Medical): No  Physical Activity: Insufficiently Active  . Days of Exercise per Week: 2 days  . Minutes of Exercise per Session: 30 min  Stress: No Stress Concern Present  . Feeling of Stress : Only a little  Social Connections: Not Isolated  . Frequency of Communication with Friends and Family: More than three times a week  . Frequency of Social Gatherings with Friends and Family: More than three times a week  . Attends Religious Services: 1 to 4 times per year  . Active Member of Clubs or Organizations: No  . Attends Archivist Meetings: 1 to 4 times per year  . Marital Status: Married  Human resources officer Violence: Not At Risk  . Fear of Current or Ex-Partner: No  . Emotionally Abused: No  . Physically Abused: No  . Sexually Abused:  No    FAMILY HISTORY:  Family History  Problem Relation Age of Onset  . Alzheimer's disease Mother   . Diabetes Father   . Heart disease Father   . Stroke Father   . Heart disease Brother   . Colon cancer Brother   . Alzheimer's disease Sister     CURRENT MEDICATIONS:  Outpatient Encounter Medications as of 02/25/2020  Medication Sig Note  . ascorbic acid (VITAMIN C) 500 MG tablet Take 500 mg by mouth 2 (two) times a week.    Marland Kitchen aspirin EC 81 MG tablet Take 81 mg by mouth daily.    Marland Kitchen atorvastatin (LIPITOR) 80 MG tablet Take 80 mg by mouth every evening.    . ezetimibe (ZETIA) 10 MG tablet Take 10 mg by mouth daily.    Marland Kitchen FLUOROURACIL IV Inject into the vein every 14 (fourteen) days.   . folic acid (FOLVITE) 725 MCG tablet Take 800 mcg by mouth daily.    . isosorbide mononitrate (IMDUR) 30 MG 24 hr tablet Take 30 mg by mouth daily.    . isosorbide mononitrate (IMDUR) 60 MG 24 hr tablet Take 60 mg by mouth daily.   Marland Kitchen LEUCOVORIN  CALCIUM IV Inject into the vein every 14 (fourteen) days.   . metformin (FORTAMET) 1000 MG (OSM) 24 hr tablet Take 2 tablets (2,000 mg total) by mouth daily with breakfast.   . metFORMIN (GLUCOPHAGE-XR) 500 MG 24 hr tablet Take 2,000 mg by mouth every morning.   . metoprolol tartrate (LOPRESSOR) 25 MG tablet Take 0.5 tablets (12.5 mg total) by mouth 2 (two) times daily. (Patient taking differently: Take 25 mg by mouth 2 (two) times daily. )   . Multiple Vitamin (MULTI-VITAMINS) TABS Take 1 tablet by mouth daily.    Marland Kitchen NIVOLUMAB IV Inject into the vein every 14 (fourteen) days.   . Nutritional Supplements (FEEDING SUPPLEMENT, OSMOLITE 1.5 CAL,) LIQD Give 1 1/2 cartons of osmolite 4 times per day via PEG.  Flush with 70m of water before and after. Drink or give via tube additional 240 ml water TID. Provides 2130 calories, 89 g protein and 22840mfree water per day; meets 100% estimated needs.  Send formula and bolus supplies. 01/08/2020: Patient states that he is using 6 times daily.  . OXALIPLATIN IV Inject into the vein every 14 (fourteen) days.   . tamsulosin (FLOMAX) 0.4 MG CAPS capsule Take 0.4 mg by mouth daily.    . Marland KitchenLPRAZolam (XANAX) 0.5 MG tablet Take 1 tablet (0.5 mg total) by mouth 2 (two) times daily as needed for anxiety. (Patient not taking: Reported on 02/11/2020) 01/08/2020: As needed and only used once.  . Glucosamine HCl (GLUCOSAMINE PO) Take 120 mg by mouth 2 (two) times daily. 12/19/2019: On hold  . HYDROcodone-acetaminophen (HYCET) 7.5-325 mg/15 ml solution Take 15 mLs by mouth every 4 (four) hours as needed for moderate pain. (Patient not taking: Reported on 01/07/2020)   . lidocaine-prilocaine (EMLA) cream Apply a dime-sized amount to port a cath site and cover with plastic wrap 1 hour prior to chemotherapy appointments (Patient not taking: Reported on 01/07/2020)   . losartan (COZAAR) 50 MG tablet Take 25 mg by mouth daily. 02/25/2020: On hold  . prochlorperazine (COMPAZINE) 10 MG tablet  Take 1 tablet (10 mg total) by mouth every 6 (six) hours as needed (Nausea or vomiting). (Patient not taking: Reported on 02/11/2020) 01/08/2020: Patient is taking as needed.   No facility-administered encounter medications on file as of 02/25/2020.  ALLERGIES:  No Known Allergies   PHYSICAL EXAM:  ECOG Performance status: 1  There were no vitals filed for this visit. There were no vitals filed for this visit.  Physical Exam Vitals reviewed.  Constitutional:      Appearance: Normal appearance.  Cardiovascular:     Rate and Rhythm: Normal rate and regular rhythm.     Heart sounds: Normal heart sounds.  Pulmonary:     Effort: Pulmonary effort is normal.     Breath sounds: Normal breath sounds.  Abdominal:     General: There is no distension.     Palpations: Abdomen is soft.  Skin:    General: Skin is warm.  Neurological:     General: No focal deficit present.     Mental Status: He is alert and oriented to person, place, and time.  Psychiatric:        Mood and Affect: Mood normal.        Behavior: Behavior normal.      LABORATORY DATA:  I have reviewed the labs as listed.  CBC    Component Value Date/Time   WBC 4.1 02/25/2020 0829   RBC 3.60 (L) 02/25/2020 0829   HGB 11.5 (L) 02/25/2020 0829   HCT 35.2 (L) 02/25/2020 0829   PLT 132 (L) 02/25/2020 0829   MCV 97.8 02/25/2020 0829   MCH 31.9 02/25/2020 0829   MCHC 32.7 02/25/2020 0829   RDW 15.8 (H) 02/25/2020 0829   LYMPHSABS 0.7 02/25/2020 0829   MONOABS 0.6 02/25/2020 0829   EOSABS 0.1 02/25/2020 0829   BASOSABS 0.0 02/25/2020 0829   CMP Latest Ref Rng & Units 02/25/2020 02/11/2020 01/28/2020  Glucose 70 - 99 mg/dL 275(H) 307(H) 289(H)  BUN 8 - 23 mg/dL _0 Creatinine 0.61 - 1.24 mg/dL 0.74 0.75 0.71  Sodium 135 - 145 mmol/L 136 137 138  Potassium 3.5 - 5.1 mmol/L 3.8 4.1 4.2  Chloride 98 - 111 mmol/L 103 103 102  CO2 22 - 32 mmol/L _1 Calcium 8.9 - 10.3 mg/dL 8.6(L) 8.9 9.0  Total Protein 6.5  - 8.1 g/dL 6.0(L) 6.1(L) 6.3(L)  Total Bilirubin 0.3 - 1.2 mg/dL 0.6 0.7 0.4  Alkaline Phos 38 - 126 U/L 43 46 51  AST 15 - 41 U/L _2 ALT 0 - 44 U/L _3 DIAGNOSTIC IMAGING:  I have independently reviewed scans.     ASSESSMENT & PLAN:   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HCC) 1.  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  PD-L1 CPS score-5.  HER-2 by FISH pending. -4 cycles of FOLFOX and Opdivo from 12/31/2019 through 02/11/2020. -No immunotherapy related side effects.  Reports cold sensitivity in the fingertips.  No tingling or numbness. -I reviewed his CBC.  Timothy count is 4.1 with normal absolute neutrophil count.  Platelet is 132.  LFTs are normal. -He will proceed with the cycle 5 of chemotherapy.  I plan to repeat scans after cycle 6.  2.  Nutrition: -He is eating more and more solid foods. -He has cut back on tube feeds to 2-4 cans/day.  On days he eats well, he is only taking into cans. -He has not been using diabetes preparation.  3.  Diabetes: -He is taking Metformin 1000 mg twice daily. -His good blood glucose is 275.  He reports fasting glucose ranging between 110 and 140.  4.  Bone metastasis: -he  had tooth pulled on 01/22/2020. -I plan to start denosumab next treatment.  5.  Anxiety: -Uses Xanax 0.5 mg as needed. -He also uses Xanax for sleep as needed.  6.  Hypertension: -Blood pressure today is 102/58. -He will continue Lopressor 25 mg twice daily.  Losartan will be on hold and will be used as needed.      Orders placed this encounter:  No orders of the defined types were placed in this encounter.    Timothy Jack, MD [place] [phone]

## 2020-02-25 NOTE — Patient Instructions (Addendum)
[facility] at [facility] Discharge Instructions  You were seen today by Dr. Katragadda. He went over your recent lab results. He will see you back in 2 weeks for labs, treatment and follow up.   Thank you for choosing [facility] at [facility] to provide your oncology and hematology care.  To afford each patient quality time with our provider, please arrive at least 15 minutes before your scheduled appointment time.   If you have a lab appointment with the Cancer Center please come in thru the  Main Entrance and check in at the main information desk  You need to re-schedule your appointment should you arrive 10 or more minutes late.  We strive to give you quality time with our providers, and arriving late affects you and other patients whose appointments are after yours.  Also, if you no show three or more times for appointments you may be dismissed from the clinic at the providers discretion.     Again, thank you for choosing [facility].  Our hope is that these requests will decrease the amount of time that you wait before being seen by our physicians.       _____________________________________________________________  Should you have questions after your visit to  Cancer Center, please contact our office at [phone] between the hours of 8:00 a.m. and 4:30 p.m.  Voicemails left after 4:00 p.m. will not be returned until the following business day.  For prescription refill requests, have your pharmacy contact our office and allow 72 hours.    Cancer Center Support Programs:   > Cancer Support Group  2nd Tuesday of the month 1pm-2pm, Journey Room    

## 2020-02-25 NOTE — Assessment & Plan Note (Addendum)
1.  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  PD-L1 CPS score-5.  HER-2 by FISH pending. -4 cycles of FOLFOX and Opdivo from 12/31/2019 through 02/11/2020. -No immunotherapy related side effects.  Reports cold sensitivity in the fingertips.  No tingling or numbness. -I reviewed his CBC.  White count is 4.1 with normal absolute neutrophil count.  Platelet is 132.  LFTs are normal. -He will proceed with the cycle 5 of chemotherapy.  I plan to repeat scans after cycle 6.  2.  Nutrition: -He is eating more and more solid foods. -He has cut back on tube feeds to 2-4 cans/day.  On days he eats well, he is only taking into cans. -He has not been using diabetes preparation.  3.  Diabetes: -He is taking Metformin 1000 mg twice daily. -His good blood glucose is 275.  He reports fasting glucose ranging between 110 and 140.  4.  Bone metastasis: -he had tooth pulled on 01/22/2020. -I plan to start denosumab next treatment.  5.  Anxiety: -Uses Xanax 0.5 mg as needed. -He also uses Xanax for sleep as needed.  6.  Hypertension: -Blood pressure today is 102/58. -He will continue Lopressor 25 mg twice daily.  Losartan will be on hold and will be used as needed.

## 2020-02-25 NOTE — Patient Instructions (Signed)
[facility] Discharge Instructions for Patients Receiving Chemotherapy  Today you received the following chemotherapy agents   To help prevent nausea and vomiting after your treatment, we encourage you to take your nausea medication   If you develop nausea and vomiting that is not controlled by your nausea medication, call the clinic.   BELOW ARE SYMPTOMS THAT SHOULD BE REPORTED IMMEDIATELY:  *FEVER GREATER THAN 100.5 F  *CHILLS WITH OR WITHOUT FEVER  NAUSEA AND VOMITING THAT IS NOT CONTROLLED WITH YOUR NAUSEA MEDICATION  *UNUSUAL SHORTNESS OF BREATH  *UNUSUAL BRUISING OR BLEEDING  TENDERNESS IN MOUTH AND THROAT WITH OR WITHOUT PRESENCE OF ULCERS  *URINARY PROBLEMS  *BOWEL PROBLEMS  UNUSUAL RASH Items with * indicate a potential emergency and should be followed up as soon as possible.  Feel free to call the clinic should you have any questions or concerns. The clinic phone number is [phone].  Please show the CHEMO ALERT CARD at check-in to the Emergency Department and triage nurse.   

## 2020-02-25 NOTE — Progress Notes (Signed)
Patient presents today for follow up visit with Dr. Delton Coombes and treatment. Vital signs within parameters for treatment. Labs pending. Patient has no complaints of any changes since his last treatment. Patient states cold sensitivity remains the same on his hands and throat. Patient states he is drinking room temp fluids.   Treatment given today per MD orders. Tolerated infusion without adverse affects. RUN noted on screen of 5 FU pump. Vital signs stable. No complaints at this time. Discharged from clinic ambulatory. F/U with [facility] as scheduled.

## 2020-02-27 ENCOUNTER — Inpatient Hospital Stay (HOSPITAL_COMMUNITY): Payer: Medicare HMO

## 2020-02-27 ENCOUNTER — Other Ambulatory Visit: Payer: Self-pay

## 2020-02-27 VITALS — BP 97/57 | HR 51 | Temp 97.3°F | Resp 18

## 2020-02-27 DIAGNOSIS — C160 Malignant neoplasm of cardia: Secondary | ICD-10-CM

## 2020-02-27 DIAGNOSIS — Z95828 Presence of other vascular implants and grafts: Secondary | ICD-10-CM

## 2020-02-27 DIAGNOSIS — Z5111 Encounter for antineoplastic chemotherapy: Secondary | ICD-10-CM | POA: Diagnosis not present

## 2020-02-27 MED ORDER — SODIUM CHLORIDE 0.9% FLUSH
10.0000 mL | INTRAVENOUS | Status: DC | PRN
  Administered 2020-02-27: 10 mL

## 2020-02-27 MED ORDER — HEPARIN SOD (PORK) LOCK FLUSH 100 UNIT/ML IV SOLN
500.0000 [IU] | Freq: Once | INTRAVENOUS | Status: AC | PRN
  Administered 2020-02-27: 500 [IU]

## 2020-02-27 NOTE — Progress Notes (Signed)
Timothy Oconnor presented for pump d/c and flush.  Portacath located in the left chest wall.  Clean, Dry and Intact Good blood return present. Portacath flushed with 39ml NS and 500U/17ml Heparin per protocol and needle removed intact. Procedure without incident. Patient tolerated procedure well. Tolerated infusion without adverse affects. Vital signs stable. No complaints at this time. Discharged from clinic ambulatory. F/U with [facility] [place] as scheduled.

## 2020-02-27 NOTE — Progress Notes (Signed)
Nutrition Follow-up:  Patient with stage IV esophageal adenocarcinoma with mets to bone.  Patient receiving folfox and opdivo.  Met with patient at pump removal.  Patient reports that he is eating more oral foods (all consistencies except for breads).  Reports that he is taking his time when eating.  Has tried a new recipe for baked spaghetti recently and swedish meatballs.  Reports made pea salad recently.  Reports that he has reduced tube feeding down to 1-2 cartons per day.  Took 2 cartons this am.  Typically does not feel week for few days after getting pump off.  Overall doing well.     Medications: reviewed  Labs: glucose 275  Anthropometrics:   Weight 217 lb on 3/24 increased from 211 lb.     NUTRITION DIAGNOSIS: Inadequate oral intake improving   INTERVENTION:  Patient to stop tube feeding and monitor weight and oral intake over the next few weeks.  Instructed patient to flush feeding tube with 1-2 syringes of water at least 1 time daily to keep tube patent.   Patient wanting to have tube removed.  Would recommend for patient not to use tube for at least 4 weeks and be able to maintain weight and hydration before considering removal of tube.  Discussed with patient it will also depend on future scan results and future treatment plans.  Patient verbalized understanding.      MONITORING, EVALUATION, GOAL: Patient will consume adequate calories and protein to prevent weight loss during treatment   NEXT VISIT: April 23rd phone f/u  Analeah Brame B. Zenia Resides, [place], [place] Registered Dietitian [phone] (pager)

## 2020-02-27 NOTE — Patient Instructions (Signed)

## 2020-03-02 ENCOUNTER — Other Ambulatory Visit (HOSPITAL_COMMUNITY): Payer: Self-pay | Admitting: *Deleted

## 2020-03-02 DIAGNOSIS — C160 Malignant neoplasm of cardia: Secondary | ICD-10-CM

## 2020-03-08 NOTE — Progress Notes (Signed)

## 2020-03-10 ENCOUNTER — Inpatient Hospital Stay (HOSPITAL_COMMUNITY): Payer: Medicare HMO | Attending: Hematology

## 2020-03-10 ENCOUNTER — Inpatient Hospital Stay (HOSPITAL_COMMUNITY): Payer: Medicare HMO | Admitting: Hematology

## 2020-03-10 ENCOUNTER — Encounter (HOSPITAL_COMMUNITY): Payer: Self-pay

## 2020-03-10 ENCOUNTER — Other Ambulatory Visit: Payer: Self-pay

## 2020-03-10 ENCOUNTER — Inpatient Hospital Stay (HOSPITAL_COMMUNITY): Payer: Medicare HMO

## 2020-03-10 VITALS — BP 128/70 | HR 55 | Temp 97.9°F | Resp 18

## 2020-03-10 VITALS — BP 113/72 | HR 52 | Temp 97.5°F | Resp 18 | Wt 215.0 lb

## 2020-03-10 DIAGNOSIS — C160 Malignant neoplasm of cardia: Secondary | ICD-10-CM | POA: Insufficient documentation

## 2020-03-10 DIAGNOSIS — C7951 Secondary malignant neoplasm of bone: Secondary | ICD-10-CM | POA: Diagnosis present

## 2020-03-10 DIAGNOSIS — Z5111 Encounter for antineoplastic chemotherapy: Secondary | ICD-10-CM | POA: Insufficient documentation

## 2020-03-10 DIAGNOSIS — Z95828 Presence of other vascular implants and grafts: Secondary | ICD-10-CM

## 2020-03-10 LAB — COMPREHENSIVE METABOLIC PANEL
ALT: 24 U/L (ref 0–44)
AST: 20 U/L (ref 15–41)
Albumin: 3.7 g/dL (ref 3.5–5.0)
Alkaline Phosphatase: 39 U/L (ref 38–126)
Anion gap: 7 (ref 5–15)
BUN: 11 mg/dL (ref 8–23)
CO2: 25 mmol/L (ref 22–32)
Calcium: 8.7 mg/dL — ABNORMAL LOW (ref 8.9–10.3)
Chloride: 106 mmol/L (ref 98–111)
Creatinine, Ser: 0.75 mg/dL (ref 0.61–1.24)
GFR calc Af Amer: >60 mL/min (ref >60)
GFR calc non Af Amer: >60 mL/min (ref >60)
Glucose, Bld: 158 mg/dL — ABNORMAL HIGH (ref 70–99)
Potassium: 3.5 mmol/L (ref 3.5–5.1)
Sodium: 138 mmol/L (ref 135–145)
Total Bilirubin: 0.7 mg/dL (ref 0.3–1.2)
Total Protein: 6.4 g/dL — ABNORMAL LOW (ref 6.5–8.1)

## 2020-03-10 LAB — CBC WITH DIFFERENTIAL/PLATELET
Abs Immature Granulocytes: 0.01 10*3/uL (ref 0.00–0.07)
Basophils Absolute: 0 10*3/uL (ref 0.0–0.1)
Basophils Relative: 0 %
Eosinophils Absolute: 0.1 10*3/uL (ref 0.0–0.5)
Eosinophils Relative: 2 %
HCT: 36.1 % — ABNORMAL LOW (ref 39.0–52.0)
Hemoglobin: 12.1 g/dL — ABNORMAL LOW (ref 13.0–17.0)
Immature Granulocytes: 0 %
Lymphocytes Relative: 22 %
Lymphs Abs: 1.2 10*3/uL (ref 0.7–4.0)
MCH: 33.2 pg (ref 26.0–34.0)
MCHC: 33.5 g/dL (ref 30.0–36.0)
MCV: 98.9 fL (ref 80.0–100.0)
Monocytes Absolute: 0.7 10*3/uL (ref 0.1–1.0)
Monocytes Relative: 13 %
Neutro Abs: 3.5 10*3/uL (ref 1.7–7.7)
Neutrophils Relative %: 63 %
Platelets: 151 10*3/uL (ref 150–400)
RBC: 3.65 MIL/uL — ABNORMAL LOW (ref 4.22–5.81)
RDW: 16.6 % — ABNORMAL HIGH (ref 11.5–15.5)
WBC: 5.5 10*3/uL (ref 4.0–10.5)
nRBC: 0 % (ref 0.0–0.2)

## 2020-03-10 MED ORDER — LEUCOVORIN CALCIUM INJECTION 350 MG
400.0000 mg/m2 | Freq: Once | INTRAVENOUS | Status: AC
  Administered 2020-03-10: 868 mg via INTRAVENOUS
  Filled 2020-03-10: qty 10

## 2020-03-10 MED ORDER — FLUOROURACIL CHEMO INJECTION 2.5 GM/50ML
400.0000 mg/m2 | Freq: Once | INTRAVENOUS | Status: AC
  Administered 2020-03-10: 14:00:00 850 mg via INTRAVENOUS
  Filled 2020-03-10: qty 17

## 2020-03-10 MED ORDER — SODIUM CHLORIDE 0.9 % IV SOLN: Freq: Once | INTRAVENOUS | Status: AC

## 2020-03-10 MED ORDER — OXALIPLATIN CHEMO INJECTION 100 MG/20ML
68.0000 mg/m2 | Freq: Once | INTRAVENOUS | Status: AC
  Administered 2020-03-10: 150 mg via INTRAVENOUS
  Filled 2020-03-10: qty 10

## 2020-03-10 MED ORDER — SODIUM CHLORIDE 0.9 % IV SOLN
240.0000 mg | Freq: Once | INTRAVENOUS | Status: AC
  Administered 2020-03-10: 11:00:00 240 mg via INTRAVENOUS
  Filled 2020-03-10: qty 24

## 2020-03-10 MED ORDER — PALONOSETRON HCL INJECTION 0.25 MG/5ML
0.2500 mg | Freq: Once | INTRAVENOUS | Status: AC
  Administered 2020-03-10: 0.25 mg via INTRAVENOUS
  Filled 2020-03-10: qty 5

## 2020-03-10 MED ORDER — DEXTROSE 5 % IV SOLN: Freq: Once | INTRAVENOUS | Status: AC

## 2020-03-10 MED ORDER — SODIUM CHLORIDE 0.9 % IV SOLN
10.0000 mg | Freq: Once | INTRAVENOUS | Status: AC
  Administered 2020-03-10: 10 mg via INTRAVENOUS
  Filled 2020-03-10: qty 10

## 2020-03-10 MED ORDER — SODIUM CHLORIDE 0.9 % IV SOLN
2310.0000 mg/m2 | INTRAVENOUS | Status: DC
  Administered 2020-03-10: 5000 mg via INTRAVENOUS
  Filled 2020-03-10: qty 100

## 2020-03-10 NOTE — Progress Notes (Signed)
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  CURRENT THERAPY: FOLFOX and Opdivo.  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[place])   12/29/2019 Cancer Staging   Staging form: Esophagus - Adenocarcinoma, AJCC 8th Edition - Clinical stage from 12/29/2019: Stage IVB (cTX, cN3, cM1) - Signed by Derek Jack, MD on 12/29/2019   12/31/2019 -  Chemotherapy   The patient had palonosetron (ALOXI) injection 0.25 mg, 0.25 mg, Intravenous,  Once, 6 of 7 cycles Administration: 0.25 mg (12/31/2019), 0.25 mg (01/14/2020), 0.25 mg (01/28/2020), 0.25 mg (02/11/2020), 0.25 mg (02/25/2020), 0.25 mg (03/10/2020) leucovorin 868 mg in dextrose 5 % 250 mL infusion, 400 mg/m2 = 868 mg, Intravenous,  Once, 6 of 7 cycles Administration: 868 mg (12/31/2019), 868 mg (01/14/2020), 868 mg (01/28/2020), 868 mg (02/11/2020), 868 mg (02/25/2020), 868 mg (03/10/2020) oxaliplatin (ELOXATIN) 150 mg in dextrose 5 % 500 mL chemo infusion, 68 mg/m2 = 150 mg (80 % of original dose 85 mg/m2), Intravenous,  Once, 6 of 7 cycles Dose modification: 68 mg/m2 (80 % of original dose 85 mg/m2, Cycle 1, Reason: Other (see comments), Comment: diabetic neuropathy) Administration: 150 mg (12/31/2019), 150 mg (01/14/2020), 150 mg (01/28/2020), 150 mg (02/11/2020), 150 mg (02/25/2020), 150 mg (03/10/2020) fluorouracil (ADRUCIL) chemo injection 850 mg, 400 mg/m2 = 850 mg, Intravenous,  Once, 6 of 7 cycles Administration: 850 mg (12/31/2019), 850 mg (01/14/2020), 850 mg (01/28/2020), 850 mg (02/11/2020), 850 mg (02/25/2020), 850 mg (03/10/2020) fluorouracil  (ADRUCIL) 5,000 mg in sodium chloride 0.9 % 150 mL chemo infusion, 2,310 mg/m2 = 5,200 mg, Intravenous, 1 Day/Dose, 6 of 7 cycles Administration: 5,000 mg (12/31/2019), 5,000 mg (01/14/2020), 5,000 mg (01/28/2020), 5,000 mg (02/11/2020), 5,000 mg (02/25/2020), 5,000 mg (03/10/2020) nivolumab (OPDIVO) 240 mg in sodium chloride 0.9 % 100 mL chemo infusion, 240 mg (100 % of original dose 240 mg), Intravenous, Once, 6 of 7 cycles Dose modification: 240 mg (original dose 240 mg, Cycle 1, Reason: Provider Judgment) Administration: 240 mg (12/31/2019), 240 mg (01/14/2020), 240 mg (01/28/2020), 240 mg (02/11/2020), 240 mg (02/25/2020), 240 mg (03/10/2020)  for chemotherapy treatment.       CANCER STAGING: Cancer Staging Adenocarcinoma of gastroesophageal junction [facility]) Staging form: Esophagus - Adenocarcinoma, AJCC 8th Edition - Clinical stage from 12/29/2019: Stage IVB (cTX, cN3, cM1) - Signed by Derek Jack, MD on 12/29/2019    INTERVAL HISTORY:  Mr. Copeman 69 y.o. male seen for follow-up and toxicity assessment prior to cycle 6 of chemotherapy.  Cycle 5 was on 02/25/2020.  In the interim he had Covid injection second shot.  He also had dental extractions done without any major problems.  Denies any tingling or numbness in the extremities.  Denies any diarrhea, skin rashes.  Denies any dry cough or shortness of breath.  He completely stopped tube feeds 2 weeks ago.  He is avoiding foods like breads which get hung up.  Reports appetite 100%.  Energy levels are 75%.   REVIEW OF SYSTEMS:  Review of Systems  HENT:   Positive for trouble swallowing.   All other systems reviewed and are negative.    PAST MEDICAL/SURGICAL HISTORY:  Past Medical History:  Diagnosis Date  . 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[place])   . Anxiety   . Diabetes ([place])   . Heart attack ([place])   . Heart disease   . High cholesterol   . Hyperplasia of prostate   . Hypertension   . Low back pain   . Port-A-Cath in  place 12/30/2019  . Tremor    Past Surgical History:  Procedure Laterality Date  . arterectomy     1993  . BIOPSY  12/01/2019   Procedure: BIOPSY;  Surgeon: Rogene Houston, MD;  Location: [facility] ENDO SUITE;  Service: Endoscopy;;  esophagus  . ESOPHAGOGASTRODUODENOSCOPY (EGD) WITH PROPOFOL N/A 12/01/2019   Procedure: ESOPHAGOGASTRODUODENOSCOPY (EGD) WITH PROPOFOL;  Surgeon: Rogene Houston, MD;  Location: [facility] ENDO SUITE;  Service: Endoscopy;  Laterality: N/A;  . ESOPHAGOGASTRODUODENOSCOPY (EGD) WITH PROPOFOL N/A 12/24/2019   Procedure: ESOPHAGOGASTRODUODENOSCOPY (EGD) WITH PROPOFOL;  Surgeon: Aviva Signs, MD;  Location: [facility] ORS;  Service: General;  Laterality: N/A;  . Heart stents     2002, 2004, 2006  . PEG PLACEMENT N/A 12/24/2019   Procedure: PERCUTANEOUS ENDOSCOPIC GASTROSTOMY (PEG) PLACEMENT;  Surgeon: Aviva Signs, MD;  Location: [facility] ORS;  Service: General;  Laterality: N/A;  . PORTACATH PLACEMENT Left 12/24/2019   Procedure: INSERTION PORT-A-CATH;  Surgeon: Aviva Signs, MD;  Location: [facility] ORS;  Service: General;  Laterality: Left;     SOCIAL HISTORY:  Social History   Socioeconomic History  . Marital status: Married    Spouse name: Not on file  . Number of children: 2  . Years of education: 66  . Highest education level: Not on file  Occupational History  . Occupation: [employer]    Comment: Delivers medication  Tobacco Use  . Smoking status: Former Smoker    Packs/day: 1.00    Years: 50.00    Pack years: 50.00    Quit date: 12/21/2017    Years since quitting: 2.2  . Smokeless tobacco: Never Used  . Tobacco comment: Quit 2017  Substance and Sexual Activity  . Alcohol use: Yes    Alcohol/week: 0.0 standard drinks    Comment: 3 drinks per week  . Drug use: No  . Sexual activity: Yes  Other Topics Concern  . Not on file  Social History Narrative   Lives at home with his wife.   Right-handed.   2 diet cokes per day.   Social Determinants of Health   Financial  Resource Strain: Low Risk   . Difficulty of Paying Living Expenses: Not hard at all  Food Insecurity: No Food Insecurity  . Worried About Charity fundraiser in the Last Year: Never true  . Ran Out of Food in the Last Year: Never true  Transportation Needs: No Transportation Needs  . Lack of Transportation (Medical): No  . Lack of Transportation (Non-Medical): No  Physical Activity: Insufficiently Active  . Days of Exercise per Week: 2 days  . Minutes of Exercise per Session: 30 min  Stress: No Stress Concern Present  . Feeling of Stress : Only a little  Social Connections: Not Isolated  . Frequency of Communication with Friends and Family: More than three times a week  . Frequency of Social Gatherings with Friends and Family: More than three times a week  . Attends Religious Services: 1 to 4 times per year  . Active Member of Clubs or Organizations: No  . Attends Archivist Meetings: 1 to 4 times per year  . Marital  Status: Married  Human resources officer Violence: Not At Risk  . Fear of Current or Ex-Partner: No  . Emotionally Abused: No  . Physically Abused: No  . Sexually Abused: No    FAMILY HISTORY:  Family History  Problem Relation Age of Onset  . Alzheimer's disease Mother   . Diabetes Father   . Heart disease Father   . Stroke Father   . Heart disease Brother   . Colon cancer Brother   . Alzheimer's disease Sister     CURRENT MEDICATIONS:  Outpatient Encounter Medications as of 03/10/2020  Medication Sig Note  . ALPRAZolam (XANAX) 0.5 MG tablet Take 1 tablet (0.5 mg total) by mouth 2 (two) times daily as needed for anxiety. 01/08/2020: As needed and only used once.  Marland Kitchen ascorbic acid (VITAMIN C) 500 MG tablet Take 500 mg by mouth 2 (two) times a week.    Marland Kitchen aspirin EC 81 MG tablet Take 81 mg by mouth daily.    Marland Kitchen atorvastatin (LIPITOR) 80 MG tablet Take 80 mg by mouth every evening.    . ezetimibe (ZETIA) 10 MG tablet Take 10 mg by mouth daily.    Marland Kitchen FLUOROURACIL IV  Inject into the vein every 14 (fourteen) days.   . folic acid (FOLVITE) 347 MCG tablet Take 800 mcg by mouth daily.    . Glucosamine HCl (GLUCOSAMINE PO) Take 120 mg by mouth 2 (two) times daily. 12/19/2019: On hold  . HYDROcodone-acetaminophen (HYCET) 7.5-325 mg/15 ml solution Take 15 mLs by mouth every 4 (four) hours as needed for moderate pain.   . isosorbide mononitrate (IMDUR) 30 MG 24 hr tablet Take 30 mg by mouth daily.    . isosorbide mononitrate (IMDUR) 60 MG 24 hr tablet Take 60 mg by mouth daily.   Marland Kitchen LEUCOVORIN CALCIUM IV Inject into the vein every 14 (fourteen) days.   Marland Kitchen lidocaine-prilocaine (EMLA) cream Apply a dime-sized amount to port a cath site and cover with plastic wrap 1 hour prior to chemotherapy appointments   . losartan (COZAAR) 50 MG tablet Take 25 mg by mouth daily. 02/25/2020: On hold  . metformin (FORTAMET) 1000 MG (OSM) 24 hr tablet Take 2 tablets (2,000 mg total) by mouth daily with breakfast.   . metFORMIN (GLUCOPHAGE-XR) 500 MG 24 hr tablet Take 2,000 mg by mouth every morning.   . metoprolol tartrate (LOPRESSOR) 25 MG tablet Take 0.5 tablets (12.5 mg total) by mouth 2 (two) times daily. (Patient taking differently: Take 25 mg by mouth 2 (two) times daily. )   . Multiple Vitamin (MULTI-VITAMINS) TABS Take 1 tablet by mouth daily.    Marland Kitchen NIVOLUMAB IV Inject into the vein every 14 (fourteen) days.   . Nutritional Supplements (FEEDING SUPPLEMENT, OSMOLITE 1.5 CAL,) LIQD Give 1 1/2 cartons of osmolite 4 times per day via PEG.  Flush with 31m of water before and after. Drink or give via tube additional 240 ml water TID. Provides 2130 calories, 89 g protein and 22824mfree water per day; meets 100% estimated needs.  Send formula and bolus supplies. 01/08/2020: Patient states that he is using 6 times daily.  . OXALIPLATIN IV Inject into the vein every 14 (fourteen) days.   . prochlorperazine (COMPAZINE) 10 MG tablet Take 1 tablet (10 mg total) by mouth every 6 (six) hours as needed  (Nausea or vomiting). 01/08/2020: Patient is taking as needed.  . tamsulosin (FLOMAX) 0.4 MG CAPS capsule Take 0.4 mg by mouth daily.     No facility-administered encounter medications  on file as of 03/10/2020.    ALLERGIES:  No Known Allergies   PHYSICAL EXAM:  ECOG Performance status: 1  Vitals:   03/10/20 0754  BP: 113/72  Pulse: (!) 52  Resp: 18  Temp: (!) 97.5 F (36.4 C)  SpO2: 99%   Filed Weights   03/10/20 0754  Weight: 215 lb (97.5 kg)    Physical Exam Vitals reviewed.  Constitutional:      Appearance: Normal appearance.  Cardiovascular:     Rate and Rhythm: Normal rate and regular rhythm.     Heart sounds: Normal heart sounds.  Pulmonary:     Effort: Pulmonary effort is normal.     Breath sounds: Normal breath sounds.  Abdominal:     General: There is no distension.     Palpations: Abdomen is soft.  Skin:    General: Skin is warm.  Neurological:     General: No focal deficit present.     Mental Status: He is alert and oriented to person, place, and time.  Psychiatric:        Mood and Affect: Mood normal.        Behavior: Behavior normal.      LABORATORY DATA:  I have reviewed the labs as listed.  CBC    Component Value Date/Time   WBC 5.5 03/10/2020 0748   RBC 3.65 (L) 03/10/2020 0748   HGB 12.1 (L) 03/10/2020 0748   HCT 36.1 (L) 03/10/2020 0748   PLT 151 03/10/2020 0748   MCV 98.9 03/10/2020 0748   MCH 33.2 03/10/2020 0748   MCHC 33.5 03/10/2020 0748   RDW 16.6 (H) 03/10/2020 0748   LYMPHSABS 1.2 03/10/2020 0748   MONOABS 0.7 03/10/2020 0748   EOSABS 0.1 03/10/2020 0748   BASOSABS 0.0 03/10/2020 0748   CMP Latest Ref Rng & Units 03/10/2020 02/25/2020 02/11/2020  Glucose 70 - 99 mg/dL 158(H) 275(H) 307(H)  BUN 8 - 23 mg/dL _0 Creatinine 0.61 - 1.24 mg/dL 0.75 0.74 0.75  Sodium 135 - 145 mmol/L 138 136 137  Potassium 3.5 - 5.1 mmol/L 3.5 3.8 4.1  Chloride 98 - 111 mmol/L 106 103 103  CO2 22 - 32 mmol/L _1 Calcium 8.9 - 10.3  mg/dL 8.7(L) 8.6(L) 8.9  Total Protein 6.5 - 8.1 g/dL 6.4(L) 6.0(L) 6.1(L)  Total Bilirubin 0.3 - 1.2 mg/dL 0.7 0.6 0.7  Alkaline Phos 38 - 126 U/L 39 43 46  AST 15 - 41 U/L _2 ALT 0 - 44 U/L _3 DIAGNOSTIC IMAGING:  I have reviewed scans.     ASSESSMENT & PLAN:   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HCC) 1.  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  PD-L1 CPS score-5.  HER-2 by FISH to be followed. -5 cycles of FOLFOX and Opdivo from 12/31/2019 through 02/25/2020. -He has tolerated last cycle of reasonably well.  I have reviewed his labs.  White count is 5.5.  Platelet count is 151.  LFTs are normal. -He will proceed with cycle 6 today.  I will arrange for PET scan in 2 weeks.  2.  Nutrition: -He has completely stopped tube feeds 2 weeks ago. -He is able to eat all sorts of foods except breads.  He is maintaining his weight.  3.  Diabetes: -He is taking Metformin 1000 mg twice daily.  His blood sugar improved to 158.  4.  Bone metastasis: -  He has completed his dental work-up. -I plan to start him on denosumab at next visit.  5.  Hypertension: -Blood pressure today is 113/72.  He will continue Lopressor 25 mg twice daily.  Losartan is on hold.  6.  Anxiety: -He will use Xanax 0.5 mg as needed.      Orders placed this encounter:  Orders Placed This Encounter  Procedures  . NM PET Image Restag (PS) Skull Base To Thigh     Derek Jack, MD [place] [phone]

## 2020-03-10 NOTE — Progress Notes (Signed)
Patient presents today for treatment and follow visit with Dr. Delton Coombes. Labs pending. Vital signs within parameters for treatment. Patient denies any changes since his last visit.   Message received from [facility] LPN/ Dr. Delton Coombes. Proceed with treatment.   Treatment given today per MD orders. Tolerated infusion without adverse affects. Vital signs stable. No complaints at this time. 5FU pump infusing per protocol. RUN noted on screen. Verified with patient. Discharged from clinic ambulatory. F/U with [facility] as scheduled.

## 2020-03-10 NOTE — Progress Notes (Signed)
Patient has been assessed, vital signs and labs have been reviewed by Dr. Katragadda. ANC, Creatinine, LFTs, and Platelets are within treatment parameters per Dr. Katragadda. The patient is good to proceed with treatment at this time.  

## 2020-03-10 NOTE — Patient Instructions (Addendum)
[facility] at [facility] Discharge Instructions  You were seen today by Dr. Katragadda. He went over your recent lab results.  We will schedule you for a PET scan.  He will see you back in 2 weeks for labs and follow up.   Thank you for choosing [facility] at [facility] to provide your oncology and hematology care.  To afford each patient quality time with our provider, please arrive at least 15 minutes before your scheduled appointment time.   If you have a lab appointment with the Cancer Center please come in thru the  Main Entrance and check in at the main information desk  You need to re-schedule your appointment should you arrive 10 or more minutes late.  We strive to give you quality time with our providers, and arriving late affects you and other patients whose appointments are after yours.  Also, if you no show three or more times for appointments you may be dismissed from the clinic at the providers discretion.     Again, thank you for choosing [facility].  Our hope is that these requests will decrease the amount of time that you wait before being seen by our physicians.       _____________________________________________________________  Should you have questions after your visit to [facility], please contact our office at [phone] between the hours of 8:00 a.m. and 4:30 p.m.  Voicemails left after 4:00 p.m. will not be returned until the following business day.  For prescription refill requests, have your pharmacy contact our office and allow 72 hours.    Cancer Center Support Programs:   > Cancer Support Group  2nd Tuesday of the month 1pm-2pm, Journey Room   

## 2020-03-10 NOTE — Patient Instructions (Signed)
[facility] at [facility]  Discharge Instructions:   _______________________________________________________________  Thank you for choosing [facility] at [facility] to provide your oncology and hematology care.  To afford each patient quality time with our providers, please arrive at least 15 minutes before your scheduled appointment.  You need to re-schedule your appointment if you arrive 10 or more minutes late.  We strive to give you quality time with our providers, and arriving late affects you and other patients whose appointments are after yours.  Also, if you no show three or more times for appointments you may be dismissed from the clinic.  Again, thank you for choosing [facility] at [facility]. Our hope is that these requests will allow you access to exceptional care and in a timely manner. _______________________________________________________________  If you have questions after your visit, please contact our office at [phone] between the hours of 8:30 a.m. and 5:00 p.m. Voicemails left after 4:30 p.m. will not be returned until the following business day. _______________________________________________________________  For prescription refill requests, have your pharmacy contact our office. _______________________________________________________________  Recommendations made by the consultant and any test results will be sent to your referring physician. _______________________________________________________________ 

## 2020-03-12 ENCOUNTER — Inpatient Hospital Stay (HOSPITAL_COMMUNITY): Payer: Medicare HMO

## 2020-03-12 ENCOUNTER — Other Ambulatory Visit: Payer: Self-pay

## 2020-03-12 ENCOUNTER — Encounter (HOSPITAL_COMMUNITY): Payer: Self-pay

## 2020-03-12 VITALS — BP 112/57 | HR 55 | Temp 98.0°F | Resp 18

## 2020-03-12 DIAGNOSIS — C160 Malignant neoplasm of cardia: Secondary | ICD-10-CM

## 2020-03-12 DIAGNOSIS — Z95828 Presence of other vascular implants and grafts: Secondary | ICD-10-CM

## 2020-03-12 DIAGNOSIS — Z5111 Encounter for antineoplastic chemotherapy: Secondary | ICD-10-CM | POA: Diagnosis not present

## 2020-03-12 MED ORDER — HEPARIN SOD (PORK) LOCK FLUSH 100 UNIT/ML IV SOLN
500.0000 [IU] | Freq: Once | INTRAVENOUS | Status: AC | PRN
  Administered 2020-03-12: 500 [IU]

## 2020-03-12 MED ORDER — SODIUM CHLORIDE 0.9% FLUSH
10.0000 mL | INTRAVENOUS | Status: DC | PRN
  Administered 2020-03-12: 10 mL

## 2020-03-12 NOTE — Patient Instructions (Signed)
[facility] Discharge Instructions for Patients Receiving Chemotherapy  Today you received the following chemotherapy agents   To help prevent nausea and vomiting after your treatment, we encourage you to take your nausea medication   If you develop nausea and vomiting that is not controlled by your nausea medication, call the clinic.   BELOW ARE SYMPTOMS THAT SHOULD BE REPORTED IMMEDIATELY:  *FEVER GREATER THAN 100.5 F  *CHILLS WITH OR WITHOUT FEVER  NAUSEA AND VOMITING THAT IS NOT CONTROLLED WITH YOUR NAUSEA MEDICATION  *UNUSUAL SHORTNESS OF BREATH  *UNUSUAL BRUISING OR BLEEDING  TENDERNESS IN MOUTH AND THROAT WITH OR WITHOUT PRESENCE OF ULCERS  *URINARY PROBLEMS  *BOWEL PROBLEMS  UNUSUAL RASH Items with * indicate a potential emergency and should be followed up as soon as possible.  Feel free to call the clinic should you have any questions or concerns. The clinic phone number is [phone].  Please show the CHEMO ALERT CARD at check-in to the Emergency Department and triage nurse.   

## 2020-03-12 NOTE — Progress Notes (Signed)
Timothy Oconnor presented for Pump d/c and port flush.  Portacath located in the left chest wall. Clean, Dry and Intact Good blood return present. Portacath flushed with 15ml NS and 500U/31ml Heparin per protocol and needle removed intact. Procedure without incident. Patient tolerated procedure well.   No complaints at this time. Discharged from clinic ambulatory. F/U with [facility] as scheduled.

## 2020-03-13 NOTE — Assessment & Plan Note (Signed)
1.  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  PD-L1 CPS score-5.  HER-2 by FISH to be followed. -5 cycles of FOLFOX and Opdivo from 12/31/2019 through 02/25/2020. -He has tolerated last cycle of reasonably well.  I have reviewed his labs.  White count is 5.5.  Platelet count is 151.  LFTs are normal. -He will proceed with cycle 6 today.  I will arrange for PET scan in 2 weeks.  2.  Nutrition: -He has completely stopped tube feeds 2 weeks ago. -He is able to eat all sorts of foods except breads.  He is maintaining his weight.  3.  Diabetes: -He is taking Metformin 1000 mg twice daily.  His blood sugar improved to 158.  4.  Bone metastasis: -He has completed his dental work-up. -I plan to start him on denosumab at next visit.  5.  Hypertension: -Blood pressure today is 113/72.  He will continue Lopressor 25 mg twice daily.  Losartan is on hold.  6.  Anxiety: -He will use Xanax 0.5 mg as needed.

## 2020-03-18 NOTE — Progress Notes (Signed)

## 2020-03-22 ENCOUNTER — Ambulatory Visit (HOSPITAL_COMMUNITY)
Admission: RE | Admit: 2020-03-22 | Discharge: 2020-03-22 | Disposition: A | Discharge status: Home / Self Care | Payer: Medicare HMO | Source: Ambulatory Visit | Attending: Hematology | Admitting: Hematology

## 2020-03-22 ENCOUNTER — Other Ambulatory Visit: Payer: Self-pay

## 2020-03-22 DIAGNOSIS — R918 Other nonspecific abnormal finding of lung field: Secondary | ICD-10-CM | POA: Insufficient documentation

## 2020-03-22 DIAGNOSIS — C160 Malignant neoplasm of cardia: Secondary | ICD-10-CM | POA: Insufficient documentation

## 2020-03-22 DIAGNOSIS — R590 Localized enlarged lymph nodes: Secondary | ICD-10-CM | POA: Insufficient documentation

## 2020-03-22 DIAGNOSIS — Z79899 Other long term (current) drug therapy: Secondary | ICD-10-CM | POA: Insufficient documentation

## 2020-03-22 LAB — GLUCOSE, CAPILLARY: Glucose-Capillary: 147 mg/dL — ABNORMAL HIGH (ref 70–99)

## 2020-03-22 MED ORDER — FLUDEOXYGLUCOSE F - 18 (FDG) INJECTION
10.7100 | Freq: Once | INTRAVENOUS | Status: AC | PRN
  Administered 2020-03-22: 08:00:00 10.71 via INTRAVENOUS

## 2020-03-24 ENCOUNTER — Inpatient Hospital Stay (HOSPITAL_COMMUNITY): Payer: Medicare HMO

## 2020-03-24 ENCOUNTER — Inpatient Hospital Stay (HOSPITAL_COMMUNITY): Payer: Medicare HMO | Admitting: Hematology

## 2020-03-24 ENCOUNTER — Other Ambulatory Visit: Payer: Self-pay

## 2020-03-24 VITALS — BP 145/75 | HR 56 | Temp 96.9°F | Resp 18

## 2020-03-24 VITALS — BP 140/72 | HR 54 | Temp 97.8°F | Resp 18 | Wt 218.4 lb

## 2020-03-24 DIAGNOSIS — C160 Malignant neoplasm of cardia: Secondary | ICD-10-CM

## 2020-03-24 DIAGNOSIS — C7951 Secondary malignant neoplasm of bone: Secondary | ICD-10-CM | POA: Insufficient documentation

## 2020-03-24 DIAGNOSIS — Z5111 Encounter for antineoplastic chemotherapy: Secondary | ICD-10-CM | POA: Diagnosis not present

## 2020-03-24 DIAGNOSIS — Z95828 Presence of other vascular implants and grafts: Secondary | ICD-10-CM

## 2020-03-24 LAB — CBC WITH DIFFERENTIAL/PLATELET
Abs Immature Granulocytes: 0.01 10*3/uL (ref 0.00–0.07)
Basophils Absolute: 0 10*3/uL (ref 0.0–0.1)
Basophils Relative: 1 %
Eosinophils Absolute: 0.1 10*3/uL (ref 0.0–0.5)
Eosinophils Relative: 2 %
HCT: 36.2 % — ABNORMAL LOW (ref 39.0–52.0)
Hemoglobin: 12 g/dL — ABNORMAL LOW (ref 13.0–17.0)
Immature Granulocytes: 0 %
Lymphocytes Relative: 22 %
Lymphs Abs: 1 10*3/uL (ref 0.7–4.0)
MCH: 33 pg (ref 26.0–34.0)
MCHC: 33.1 g/dL (ref 30.0–36.0)
MCV: 99.5 fL (ref 80.0–100.0)
Monocytes Absolute: 0.7 10*3/uL (ref 0.1–1.0)
Monocytes Relative: 15 %
Neutro Abs: 2.8 10*3/uL (ref 1.7–7.7)
Neutrophils Relative %: 60 %
Platelets: 161 10*3/uL (ref 150–400)
RBC: 3.64 MIL/uL — ABNORMAL LOW (ref 4.22–5.81)
RDW: 16.9 % — ABNORMAL HIGH (ref 11.5–15.5)
WBC: 4.7 10*3/uL (ref 4.0–10.5)
nRBC: 0 % (ref 0.0–0.2)

## 2020-03-24 LAB — COMPREHENSIVE METABOLIC PANEL
ALT: 22 U/L (ref 0–44)
AST: 17 U/L (ref 15–41)
Albumin: 3.5 g/dL (ref 3.5–5.0)
Alkaline Phosphatase: 42 U/L (ref 38–126)
Anion gap: 7 (ref 5–15)
BUN: 14 mg/dL (ref 8–23)
CO2: 26 mmol/L (ref 22–32)
Calcium: 8.8 mg/dL — ABNORMAL LOW (ref 8.9–10.3)
Chloride: 107 mmol/L (ref 98–111)
Creatinine, Ser: 0.69 mg/dL (ref 0.61–1.24)
GFR calc Af Amer: >60 mL/min (ref >60)
GFR calc non Af Amer: >60 mL/min (ref >60)
Glucose, Bld: 147 mg/dL — ABNORMAL HIGH (ref 70–99)
Potassium: 4 mmol/L (ref 3.5–5.1)
Sodium: 140 mmol/L (ref 135–145)
Total Bilirubin: 0.9 mg/dL (ref 0.3–1.2)
Total Protein: 6.4 g/dL — ABNORMAL LOW (ref 6.5–8.1)

## 2020-03-24 LAB — MAGNESIUM: Magnesium: 2.1 mg/dL (ref 1.7–2.4)

## 2020-03-24 LAB — LACTATE DEHYDROGENASE: LDH: 145 U/L (ref 98–192)

## 2020-03-24 MED ORDER — SODIUM CHLORIDE 0.9% FLUSH
10.0000 mL | INTRAVENOUS | Status: DC | PRN
  Administered 2020-03-24: 10 mL

## 2020-03-24 MED ORDER — DEXTROSE 5 % IV SOLN: Freq: Once | INTRAVENOUS | Status: AC

## 2020-03-24 MED ORDER — OXALIPLATIN CHEMO INJECTION 100 MG/20ML
68.0000 mg/m2 | Freq: Once | INTRAVENOUS | Status: AC
  Administered 2020-03-24: 11:00:00 150 mg via INTRAVENOUS
  Filled 2020-03-24: qty 10

## 2020-03-24 MED ORDER — PALONOSETRON HCL INJECTION 0.25 MG/5ML
0.2500 mg | Freq: Once | INTRAVENOUS | Status: AC
  Administered 2020-03-24: 0.25 mg via INTRAVENOUS
  Filled 2020-03-24: qty 5

## 2020-03-24 MED ORDER — SODIUM CHLORIDE 0.9 % IV SOLN
10.0000 mg | Freq: Once | INTRAVENOUS | Status: AC
  Administered 2020-03-24: 10 mg via INTRAVENOUS
  Filled 2020-03-24: qty 10

## 2020-03-24 MED ORDER — FLUOROURACIL CHEMO INJECTION 2.5 GM/50ML
400.0000 mg/m2 | Freq: Once | INTRAVENOUS | Status: AC
  Administered 2020-03-24: 13:00:00 850 mg via INTRAVENOUS
  Filled 2020-03-24: qty 17

## 2020-03-24 MED ORDER — LEUCOVORIN CALCIUM INJECTION 350 MG
400.0000 mg/m2 | Freq: Once | INTRAVENOUS | Status: AC
  Administered 2020-03-24: 868 mg via INTRAVENOUS
  Filled 2020-03-24: qty 43.4

## 2020-03-24 MED ORDER — SODIUM CHLORIDE 0.9 % IV SOLN
240.0000 mg | Freq: Once | INTRAVENOUS | Status: AC
  Administered 2020-03-24: 240 mg via INTRAVENOUS
  Filled 2020-03-24: qty 24

## 2020-03-24 MED ORDER — SODIUM CHLORIDE 0.9 % IV SOLN
2310.0000 mg/m2 | INTRAVENOUS | Status: DC
  Administered 2020-03-24: 13:00:00 5000 mg via INTRAVENOUS
  Filled 2020-03-24: qty 100

## 2020-03-24 MED ORDER — SODIUM CHLORIDE 0.9 % IV SOLN: Freq: Once | INTRAVENOUS | Status: AC

## 2020-03-24 NOTE — Progress Notes (Signed)
Patient has been assessed, vital signs and labs have been reviewed by Dr. Delton Coombes. ANC, Creatinine, LFTs, and Platelets are within treatment parameters per Dr. Delton Coombes. The patient is good to proceed with treatment at this time. He will also start Xgeva today per Dr. Delton Coombes.

## 2020-03-24 NOTE — Patient Instructions (Addendum)
[place] at [facility] Discharge Instructions  You were seen today by Dr. Delton Coombes. He went over your recent lab results. He would like you to start taking calcium 1-2 tablets daily. He will see you back in 2 weeks for labs,treatment and follow up.   Thank you for choosing [place] at [facility] to provide your oncology and hematology care.  To afford each patient quality time with our provider, please arrive at least 15 minutes before your scheduled appointment time.   If you have a lab appointment with the [facility] please come in thru the  Main Entrance and check in at the main information desk  You need to re-schedule your appointment should you arrive 10 or more minutes late.  We strive to give you quality time with our providers, and arriving late affects you and other patients whose appointments are after yours.  Also, if you no show three or more times for appointments you may be dismissed from the clinic at the providers discretion.     Again, thank you for choosing [facility].  Our hope is that these requests will decrease the amount of time that you wait before being seen by our physicians.       _____________________________________________________________  Should you have questions after your visit to [facility], please contact our office at [phone] between the hours of 8:00 a.m. and 4:30 p.m.  Voicemails left after 4:00 p.m. will not be returned until the following business day.  For prescription refill requests, have your pharmacy contact our office and allow 72 hours.    Cancer Center Support Programs:   > Cancer Support Group  2nd Tuesday of the month 1pm-2pm, Journey Room

## 2020-03-24 NOTE — Progress Notes (Signed)
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  CURRENT THERAPY: FOLFOX and Opdivo.  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[place])   12/29/2019 Cancer Staging   Staging form: Esophagus - Adenocarcinoma, AJCC 8th Edition - Clinical stage from 12/29/2019: Stage IVB (cTX, cN3, cM1) - Signed by Derek Jack, MD on 12/29/2019   12/31/2019 -  Chemotherapy   The patient had palonosetron (ALOXI) injection 0.25 mg, 0.25 mg, Intravenous,  Once, 7 of 12 cycles Administration: 0.25 mg (12/31/2019), 0.25 mg (01/14/2020), 0.25 mg (01/28/2020), 0.25 mg (02/11/2020), 0.25 mg (02/25/2020), 0.25 mg (03/10/2020) leucovorin 868 mg in dextrose 5 % 250 mL infusion, 400 mg/m2 = 868 mg, Intravenous,  Once, 7 of 12 cycles Administration: 868 mg (12/31/2019), 868 mg (01/14/2020), 868 mg (01/28/2020), 868 mg (02/11/2020), 868 mg (02/25/2020), 868 mg (03/10/2020) oxaliplatin (ELOXATIN) 150 mg in dextrose 5 % 500 mL chemo infusion, 68 mg/m2 = 150 mg (80 % of original dose 85 mg/m2), Intravenous,  Once, 7 of 12 cycles Dose modification: 68 mg/m2 (80 % of original dose 85 mg/m2, Cycle 1, Reason: Other (see comments), Comment: diabetic neuropathy) Administration: 150 mg (12/31/2019), 150 mg (01/14/2020), 150 mg (01/28/2020), 150 mg (02/11/2020), 150 mg (02/25/2020), 150 mg (03/10/2020) fluorouracil (ADRUCIL) chemo injection 850 mg, 400 mg/m2 = 850 mg, Intravenous,  Once, 7 of 12 cycles Administration: 850 mg (12/31/2019), 850 mg (01/14/2020), 850 mg (01/28/2020), 850 mg (02/11/2020), 850 mg (02/25/2020), 850 mg (03/10/2020) fluorouracil  (ADRUCIL) 5,000 mg in sodium chloride 0.9 % 150 mL chemo infusion, 2,310 mg/m2 = 5,200 mg, Intravenous, 1 Day/Dose, 7 of 12 cycles Administration: 5,000 mg (12/31/2019), 5,000 mg (01/14/2020), 5,000 mg (01/28/2020), 5,000 mg (02/11/2020), 5,000 mg (02/25/2020), 5,000 mg (03/10/2020) nivolumab (OPDIVO) 240 mg in sodium chloride 0.9 % 100 mL chemo infusion, 240 mg (100 % of original dose 240 mg), Intravenous, Once, 7 of 12 cycles Dose modification: 240 mg (original dose 240 mg, Cycle 1, Reason: Provider Judgment) Administration: 240 mg (12/31/2019), 240 mg (01/14/2020), 240 mg (01/28/2020), 240 mg (02/11/2020), 240 mg (02/25/2020), 240 mg (03/10/2020)  for chemotherapy treatment.       CANCER STAGING: Cancer Staging Adenocarcinoma of gastroesophageal junction [facility]) Staging form: Esophagus - Adenocarcinoma, AJCC 8th Edition - Clinical stage from 12/29/2019: Stage IVB (cTX, cN3, cM1) - Signed by Derek Jack, MD on 12/29/2019    INTERVAL HISTORY:  Timothy Oconnor 69 y.o. 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  He is receiving chemotherapy with immunotherapy.  Appetite and energy levels are high percent.  He has not been using his feeding tube for the last 4 weeks.  He is able to eat most types of foods without any difficulty.  He has to chew the meats and bread a lot for them to easily go down.  Denies any diarrhea.  No skin rashes.  REVIEW OF SYSTEMS:  Review of Systems  All other systems reviewed and are negative.    PAST MEDICAL/SURGICAL HISTORY:  Past Medical History:  Diagnosis Date  . 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[place])   . Anxiety   . Diabetes ([place])   .  Heart attack ([place])   . Heart disease   . High cholesterol   . Hyperplasia of prostate   . Hypertension   . Low back pain   . Port-A-Cath in place 12/30/2019  . Tremor    Past Surgical History:  Procedure Laterality Date  . arterectomy     1993  . BIOPSY  12/01/2019   Procedure:  BIOPSY;  Surgeon: Rogene [place], MD;  Location: [facility] ENDO SUITE;  Service: Endoscopy;;  esophagus  . ESOPHAGOGASTRODUODENOSCOPY (EGD) WITH PROPOFOL N/A 12/01/2019   Procedure: ESOPHAGOGASTRODUODENOSCOPY (EGD) WITH PROPOFOL;  Surgeon: Rogene [place], MD;  Location: [facility] ENDO SUITE;  Service: Endoscopy;  Laterality: N/A;  . ESOPHAGOGASTRODUODENOSCOPY (EGD) WITH PROPOFOL N/A 12/24/2019   Procedure: ESOPHAGOGASTRODUODENOSCOPY (EGD) WITH PROPOFOL;  Surgeon: Aviva Signs, MD;  Location: [facility] ORS;  Service: General;  Laterality: N/A;  . Heart stents     2002, 2004, 2006  . PEG PLACEMENT N/A 12/24/2019   Procedure: PERCUTANEOUS ENDOSCOPIC GASTROSTOMY (PEG) PLACEMENT;  Surgeon: Aviva Signs, MD;  Location: [facility] ORS;  Service: General;  Laterality: N/A;  . PORTACATH PLACEMENT Left 12/24/2019   Procedure: INSERTION PORT-A-CATH;  Surgeon: Aviva Signs, MD;  Location: [facility] ORS;  Service: General;  Laterality: Left;     SOCIAL HISTORY:  Social History   Socioeconomic History  . Marital status: Married    Spouse name: Not on file  . Number of children: 2  . Years of education: 72  . Highest education level: Not on file  Occupational History  . Occupation: [employer]    Comment: Delivers medication  Tobacco Use  . Smoking status: Former Smoker    Packs/day: 1.00    Years: 50.00    Pack years: 50.00    Quit date: 12/21/2017    Years since quitting: 2.2  . Smokeless tobacco: Never Used  . Tobacco comment: Quit 2017  Substance and Sexual Activity  . Alcohol use: Yes    Alcohol/week: 0.0 standard drinks    Comment: 3 drinks per week  . Drug use: No  . Sexual activity: Yes  Other Topics Concern  . Not on file  Social History Narrative   Lives at home with his wife.   Right-handed.   2 diet cokes per day.   Social Determinants of Health   Financial Resource Strain: Low Risk   . Difficulty of Paying Living Expenses: Not hard at all  Food Insecurity: No Food Insecurity  . Worried About Paediatric nurse in the Last Year: Never true  . Ran Out of Food in the Last Year: Never true  Transportation Needs: No Transportation Needs  . Lack of Transportation (Medical): No  . Lack of Transportation (Non-Medical): No  Physical Activity: Insufficiently Active  . Days of Exercise per Week: 2 days  . Minutes of Exercise per Session: 30 min  Stress: No Stress Concern Present  . Feeling of Stress : Only a little  Social Connections: Not Isolated  . Frequency of Communication with Friends and Family: More than three times a week  . Frequency of Social Gatherings with Friends and Family: More than three times a week  . Attends Religious Services: 1 to 4 times per year  . Active Member of Clubs or Organizations: No  . Attends Archivist Meetings: 1 to 4 times per year  . Marital Status: Married  Human resources officer Violence: Not At Risk  . Fear of Current or Ex-Partner: No  . Emotionally Abused: No  . Physically  Abused: No  . Sexually Abused: No    FAMILY HISTORY:  Family History  Problem Relation Age of Onset  . Alzheimer's disease Mother   . Diabetes Father   . Heart disease Father   . Stroke Father   . Heart disease Brother   . Colon cancer Brother   . Alzheimer's disease Sister     CURRENT MEDICATIONS:  Outpatient Encounter Medications as of 03/24/2020  Medication Sig Note  . co-enzyme Q-10 30 MG capsule Take 30 mg by mouth daily.   Marland Kitchen ALPRAZolam (XANAX) 0.5 MG tablet Take 1 tablet (0.5 mg total) by mouth 2 (two) times daily as needed for anxiety. 01/08/2020: As needed and only used once.  Marland Kitchen ascorbic acid (VITAMIN C) 500 MG tablet Take 500 mg by mouth 2 (two) times a week.    Marland Kitchen aspirin EC 81 MG tablet Take 81 mg by mouth daily.    Marland Kitchen atorvastatin (LIPITOR) 80 MG tablet Take 80 mg by mouth every evening.    . ezetimibe (ZETIA) 10 MG tablet Take 10 mg by mouth daily.    Marland Kitchen FLUOROURACIL IV Inject into the vein every 14 (fourteen) days.   . folic acid (FOLVITE) 498 MCG  tablet Take 800 mcg by mouth daily.    . Glucosamine HCl (GLUCOSAMINE PO) Take 120 mg by mouth 2 (two) times daily. 12/19/2019: On hold  . HYDROcodone-acetaminophen (HYCET) 7.5-325 mg/15 ml solution Take 15 mLs by mouth every 4 (four) hours as needed for moderate pain.   . isosorbide mononitrate (IMDUR) 30 MG 24 hr tablet Take 30 mg by mouth daily.    . isosorbide mononitrate (IMDUR) 60 MG 24 hr tablet Take 60 mg by mouth daily.   Marland Kitchen LEUCOVORIN CALCIUM IV Inject into the vein every 14 (fourteen) days.   Marland Kitchen lidocaine-prilocaine (EMLA) cream Apply a dime-sized amount to port a cath site and cover with plastic wrap 1 hour prior to chemotherapy appointments   . losartan (COZAAR) 50 MG tablet Take 25 mg by mouth daily. 02/25/2020: On hold  . metformin (FORTAMET) 1000 MG (OSM) 24 hr tablet Take 2 tablets (2,000 mg total) by mouth daily with breakfast.   . metoprolol tartrate (LOPRESSOR) 25 MG tablet Take 0.5 tablets (12.5 mg total) by mouth 2 (two) times daily. (Patient taking differently: Take 25 mg by mouth 2 (two) times daily. )   . Multiple Vitamin (MULTI-VITAMINS) TABS Take 1 tablet by mouth daily.    Marland Kitchen NIVOLUMAB IV Inject into the vein every 14 (fourteen) days.   . Nutritional Supplements (FEEDING SUPPLEMENT, OSMOLITE 1.5 CAL,) LIQD Give 1 1/2 cartons of osmolite 4 times per day via PEG.  Flush with 66m of water before and after. Drink or give via tube additional 240 ml water TID. Provides 2130 calories, 89 g protein and 22874mfree water per day; meets 100% estimated needs.  Send formula and bolus supplies. (Patient not taking: Reported on 03/24/2020) 01/08/2020: Patient states that he is using 6 times daily.  . OXALIPLATIN IV Inject into the vein every 14 (fourteen) days.   . prochlorperazine (COMPAZINE) 10 MG tablet Take 1 tablet (10 mg total) by mouth every 6 (six) hours as needed (Nausea or vomiting). 01/08/2020: Patient is taking as needed.  . tamsulosin (FLOMAX) 0.4 MG CAPS capsule Take 0.4 mg by mouth  daily.    . [DISCONTINUED] metFORMIN (GLUCOPHAGE-XR) 500 MG 24 hr tablet Take 2,000 mg by mouth every morning.    No facility-administered encounter medications on file as of  03/24/2020.    ALLERGIES:  No Known Allergies   PHYSICAL EXAM:  ECOG Performance status: 1  Vitals:   03/24/20 0817  BP: 140/72  Pulse: (!) 54  Resp: 18  Temp: 97.8 F (36.6 C)  SpO2: 99%   Filed Weights   03/24/20 0817  Weight: 218 lb 6.4 oz (99.1 kg)    Physical Exam Vitals reviewed.  Constitutional:      Appearance: Normal appearance.  Cardiovascular:     Rate and Rhythm: Normal rate and regular rhythm.     Heart sounds: Normal heart sounds.  Pulmonary:     Effort: Pulmonary effort is normal.     Breath sounds: Normal breath sounds.  Abdominal:     General: There is no distension.     Palpations: Abdomen is soft.  Skin:    General: Skin is warm.  Neurological:     General: No focal deficit present.     Mental Status: He is alert and oriented to person, place, and time.  Psychiatric:        Mood and Affect: Mood normal.        Behavior: Behavior normal.      LABORATORY DATA:  I have reviewed the labs as listed.  CBC    Component Value Date/Time   WBC 4.7 03/24/2020 0818   RBC 3.64 (L) 03/24/2020 0818   HGB 12.0 (L) 03/24/2020 0818   HCT 36.2 (L) 03/24/2020 0818   PLT 161 03/24/2020 0818   MCV 99.5 03/24/2020 0818   MCH 33.0 03/24/2020 0818   MCHC 33.1 03/24/2020 0818   RDW 16.9 (H) 03/24/2020 0818   LYMPHSABS 1.0 03/24/2020 0818   MONOABS 0.7 03/24/2020 0818   EOSABS 0.1 03/24/2020 0818   BASOSABS 0.0 03/24/2020 0818   CMP Latest Ref Rng & Units 03/24/2020 03/10/2020 02/25/2020  Glucose 70 - 99 mg/dL 147(H) 158(H) 275(H)  BUN 8 - 23 mg/dL _0 Creatinine 0.61 - 1.24 mg/dL 0.69 0.75 0.74  Sodium 135 - 145 mmol/L 140 138 136  Potassium 3.5 - 5.1 mmol/L 4.0 3.5 3.8  Chloride 98 - 111 mmol/L 107 106 103  CO2 22 - 32 mmol/L _1 Calcium 8.9 - 10.3 mg/dL 8.8(L)  8.7(L) 8.6(L)  Total Protein 6.5 - 8.1 g/dL 6.4(L) 6.4(L) 6.0(L)  Total Bilirubin 0.3 - 1.2 mg/dL 0.9 0.7 0.6  Alkaline Phos 38 - 126 U/L 42 39 43  AST 15 - 41 U/L _2 ALT 0 - 44 U/L _3 DIAGNOSTIC IMAGING:  I have reviewed the scans with the patient.     ASSESSMENT & PLAN:   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HCC) 1.  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  HER-2 FISH to be followed up.  PD-L1 CPS score-5. -6 cycles of FOLFOX and Opdivo from 12/31/2019 through 03/10/2020. -We reviewed results of the PET scan from 03/22/2020.  Marked reduction in size and metabolic activity of the distal esophageal lesion.  Marked reduction in size and metabolic activity of mediastinal adenopathy.  Near complete resolution with only 2 small mildly metabolic mediastinal lymph nodes remaining.  Resolution of lymph nodes in the upper abdomen.  Right hip bone lesion still present but better. -I have recommended continuation of chemotherapy and immunotherapy at this time.  We will plan to rescan him in 2 to 3 months. -I have reviewed his labs.  They are adequate to  proceed with his next cycle today.  I will see him back in 2 weeks for follow-up.  2.  Nutrition: -He has not used his feeding tube for the last 1 month.  He is able to eat most type of foods without any problem.  3.  Bone meta stasis: -He has completed dental work-up.  We will plan to start him on denosumab.  His insurance only authorized Zometa.  We will proceed with Zometa today.  4.  Diabetes: -He will continue Metformin 1000 mg twice daily.  Blood sugars improved since he started eating solid foods.  5.  Anxiety: -She will use Xanax 0.5 mg as needed.  6.  Hypertension: -Blood pressure today is 140/72.  He will continue Lopressor 25 mg twice daily.  Losartan is on hold.      Orders placed this encounter:  Orders Placed This Encounter  Procedures  . CBC with  Differential/Platelet  . Comprehensive metabolic panel  . Magnesium  . CEA     Derek Jack, MD [place] [phone]

## 2020-03-24 NOTE — Patient Instructions (Signed)
[facility] Discharge Instructions for Patients Receiving Chemotherapy   Beginning January 23rd 2017 lab work for the [facility] will be done in the  Main lab at [facility] on 1st floor. If you have a lab appointment with the [place] please come in thru the  Main Entrance and check in at the main information desk   Today you received the following chemotherapy agents Oxaliplatin,Leucovorin,5FU and Opdivo. Follow-up as scheduled. Call clinic for any questions or concerns  To help prevent nausea and vomiting after your treatment, we encourage you to take your nausea medication   If you develop nausea and vomiting, or diarrhea that is not controlled by your medication, call the clinic.  The clinic phone number is [phone]. Office hours are Monday-Friday 8:30am-5:00pm.  BELOW ARE SYMPTOMS THAT SHOULD BE REPORTED IMMEDIATELY:  *FEVER GREATER THAN 101.0 F  *CHILLS WITH OR WITHOUT FEVER  NAUSEA AND VOMITING THAT IS NOT CONTROLLED WITH YOUR NAUSEA MEDICATION  *UNUSUAL SHORTNESS OF BREATH  *UNUSUAL BRUISING OR BLEEDING  TENDERNESS IN MOUTH AND THROAT WITH OR WITHOUT PRESENCE OF ULCERS  *URINARY PROBLEMS  *BOWEL PROBLEMS  UNUSUAL RASH Items with * indicate a potential emergency and should be followed up as soon as possible. If you have an emergency after office hours please contact your primary care physician or go to the nearest emergency department.  Please call the clinic during office hours if you have any questions or concerns.   You may also contact the Patient Navigator at [phone] should you have any questions or need assistance in obtaining follow up care.      Resources For Cancer Patients and their Caregivers ? American Cancer Society: Can assist with transportation, wigs, general needs, runs Look Good Feel Better.        740-249-1409 ? Cancer Care: Provides financial assistance, online support groups, medication/co-pay  assistance.  1-800-813-HOPE 331-302-1735) ? [place] Assists [place] cancer patients and their families through emotional , educational and financial support.  205-769-7928 ? [place] DSS Where to apply for food stamps, Medicaid and utility assistance. 512 067 2153 ? [facility]: Transportation to medical appointments. 9498095825 ? Social Security Administration: May apply for disability if have a Stage IV cancer. (709) 836-9060 (660) [phone] ? [employer], Disability and Transit Services: Assists with nutrition, care and transit needs. [phone]

## 2020-03-24 NOTE — Patient Instructions (Signed)
[facility] at [facility] Discharge Instructions  Labs drawn from portacath today   Thank you for choosing [facility] at [facility] to provide your oncology and hematology care.  To afford each patient quality time with our provider, please arrive at least 15 minutes before your scheduled appointment time.   If you have a lab appointment with the Cancer Center please come in thru the Main Entrance and check in at the main information desk.  You need to re-schedule your appointment should you arrive 10 or more minutes late.  We strive to give you quality time with our providers, and arriving late affects you and other patients whose appointments are after yours.  Also, if you no show three or more times for appointments you may be dismissed from the clinic at the providers discretion.     Again, thank you for choosing [facility].  Our hope is that these requests will decrease the amount of time that you wait before being seen by our physicians.       _____________________________________________________________  Should you have questions after your visit to [facility], please contact our office at [phone] between the hours of 8:00 a.m. and 4:30 p.m.  Voicemails left after 4:00 p.m. will not be returned until the following business day.  For prescription refill requests, have your pharmacy contact our office and allow 72 hours.    Due to Covid, you will need to wear a mask upon entering the hospital. If you do not have a mask, a mask will be given to you at the Main Entrance upon arrival. For doctor visits, patients may have 1 support person with them. For treatment visits, patients can not have anyone with them due to social distancing guidelines and our immunocompromised population.     

## 2020-03-24 NOTE — Assessment & Plan Note (Signed)
1.  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  HER-2 FISH to be followed up.  PD-L1 CPS score-5. -6 cycles of FOLFOX and Opdivo from 12/31/2019 through 03/10/2020. -We reviewed results of the PET scan from 03/22/2020.  Marked reduction in size and metabolic activity of the distal esophageal lesion.  Marked reduction in size and metabolic activity of mediastinal adenopathy.  Near complete resolution with only 2 small mildly metabolic mediastinal lymph nodes remaining.  Resolution of lymph nodes in the upper abdomen.  Right hip bone lesion still present but better. -I have recommended continuation of chemotherapy and immunotherapy at this time.  We will plan to rescan him in 2 to 3 months. -I have reviewed his labs.  They are adequate to proceed with his next cycle today.  I will see him back in 2 weeks for follow-up.  2.  Nutrition: -He has not used his feeding tube for the last 1 month.  He is able to eat most type of foods without any problem.  3.  Bone meta stasis: -He has completed dental work-up.  We will plan to start him on denosumab.  His insurance only authorized Zometa.  We will proceed with Zometa today.  4.  Diabetes: -He will continue Metformin 1000 mg twice daily.  Blood sugars improved since he started eating solid foods.  5.  Anxiety: -She will use Xanax 0.5 mg as needed.  6.  Hypertension: -Blood pressure today is 140/72.  He will continue Lopressor 25 mg twice daily.  Losartan is on hold.

## 2020-03-24 NOTE — Progress Notes (Signed)
0910 Labs reviewed with and pt seen by Dr. Delton Coombes and pt approved for FOLFOX and Opdivo infusions as well as Zometa infusion today per MD                                                                     0930 Zometa infusion needs to be authorized so it will be given on Friday 03/26/20 when 5FU pump is discontinued.Pt given written information on Zometa and reviewed with permit signed.Pt verbalized understanding.                                               Lynnae Prude tolerated Opdivo and FOLFOX infusions well without complaints or incident. Pt discharged with 5FU pump infusing without issues. VSS upon discharge. Pt instructed to start taking Calcium 1200 mg PO since he will be receiving Zometa. Pt verbalized understanding. Pt discharged self ambulatory in satisfactory condition

## 2020-03-26 ENCOUNTER — Inpatient Hospital Stay (HOSPITAL_COMMUNITY): Payer: Medicare HMO

## 2020-03-26 ENCOUNTER — Encounter (HOSPITAL_COMMUNITY): Payer: Medicare HMO

## 2020-03-26 ENCOUNTER — Encounter (HOSPITAL_COMMUNITY): Payer: Self-pay

## 2020-03-26 ENCOUNTER — Other Ambulatory Visit: Payer: Self-pay

## 2020-03-26 VITALS — BP 109/64 | HR 51 | Temp 98.2°F | Resp 18

## 2020-03-26 DIAGNOSIS — C160 Malignant neoplasm of cardia: Secondary | ICD-10-CM

## 2020-03-26 DIAGNOSIS — Z95828 Presence of other vascular implants and grafts: Secondary | ICD-10-CM

## 2020-03-26 DIAGNOSIS — C7951 Secondary malignant neoplasm of bone: Secondary | ICD-10-CM

## 2020-03-26 DIAGNOSIS — Z5111 Encounter for antineoplastic chemotherapy: Secondary | ICD-10-CM | POA: Diagnosis not present

## 2020-03-26 MED ORDER — HEPARIN SOD (PORK) LOCK FLUSH 100 UNIT/ML IV SOLN
500.0000 [IU] | Freq: Once | INTRAVENOUS | Status: AC | PRN
  Administered 2020-03-26: 500 [IU]

## 2020-03-26 MED ORDER — SODIUM CHLORIDE 0.9 % IV SOLN: Freq: Once | INTRAVENOUS | Status: AC

## 2020-03-26 MED ORDER — ZOLEDRONIC ACID 4 MG/100ML IV SOLN
4.0000 mg | Freq: Once | INTRAVENOUS | Status: AC
  Administered 2020-03-26: 4 mg via INTRAVENOUS
  Filled 2020-03-26: qty 100

## 2020-03-26 NOTE — Progress Notes (Signed)
Nutrition Follow-up:  Patient with stage IV esophageal adenocarcinoma with mets to bone.  Patient receiving folfox and opdivo.    Met with patient following pump removal and zometa.  Patient reports that he has not used the feeding tube in a month.  Continues to flush with at least 28m of water daily.    Reports that he is eating all foods except breads.  Reports that he takes his time and eats slowly, chews well.  Blood glucose is improved.      Medications: reviewed  Labs: reviewed  Anthropometrics:   Weight noted 218 lb 6.4 oz on 4/21 increased from 217 lb on 3/24   NUTRITION DIAGNOSIS: Inadequate oral intake improving   INTERVENTION:  Patient has been able to maintain weight for at least 4 weeks without using feeding tube.  Continue to flush feeding tube with at least 60 ml of water until determination made regarding appropriateness of tube removal by future treatment/plan of care.  Patient to continue eating orally focusing on well balanced diet.  Patient has contact information    MONITORING, EVALUATION, GOAL: Patient will consume adequate calories and protein to prevent weight loss during treatment   NEXT VISIT: May 14th phone call  Jaslynne Dahan B. AZenia Resides REast [place] LCementonRegistered Dietitian [phone](pager)

## 2020-04-12 ENCOUNTER — Other Ambulatory Visit: Payer: Self-pay

## 2020-04-12 ENCOUNTER — Inpatient Hospital Stay (HOSPITAL_COMMUNITY): Payer: Medicare HMO | Admitting: Hematology

## 2020-04-12 ENCOUNTER — Inpatient Hospital Stay (HOSPITAL_COMMUNITY): Payer: Medicare HMO | Attending: Hematology

## 2020-04-12 ENCOUNTER — Inpatient Hospital Stay (HOSPITAL_COMMUNITY): Payer: Medicare HMO

## 2020-04-12 VITALS — BP 132/74 | HR 63 | Temp 97.6°F | Resp 18

## 2020-04-12 DIAGNOSIS — C7951 Secondary malignant neoplasm of bone: Secondary | ICD-10-CM | POA: Diagnosis not present

## 2020-04-12 DIAGNOSIS — Z79899 Other long term (current) drug therapy: Secondary | ICD-10-CM | POA: Insufficient documentation

## 2020-04-12 DIAGNOSIS — Z833 Family history of diabetes mellitus: Secondary | ICD-10-CM | POA: Insufficient documentation

## 2020-04-12 DIAGNOSIS — Z7984 Long term (current) use of oral hypoglycemic drugs: Secondary | ICD-10-CM | POA: Diagnosis not present

## 2020-04-12 DIAGNOSIS — Z5112 Encounter for antineoplastic immunotherapy: Secondary | ICD-10-CM | POA: Insufficient documentation

## 2020-04-12 DIAGNOSIS — I10 Essential (primary) hypertension: Secondary | ICD-10-CM | POA: Diagnosis not present

## 2020-04-12 DIAGNOSIS — C160 Malignant neoplasm of cardia: Secondary | ICD-10-CM

## 2020-04-12 DIAGNOSIS — Z95828 Presence of other vascular implants and grafts: Secondary | ICD-10-CM

## 2020-04-12 DIAGNOSIS — F419 Anxiety disorder, unspecified: Secondary | ICD-10-CM | POA: Diagnosis not present

## 2020-04-12 DIAGNOSIS — Z87891 Personal history of nicotine dependence: Secondary | ICD-10-CM | POA: Diagnosis not present

## 2020-04-12 DIAGNOSIS — Z5111 Encounter for antineoplastic chemotherapy: Secondary | ICD-10-CM | POA: Diagnosis present

## 2020-04-12 LAB — COMPREHENSIVE METABOLIC PANEL
ALT: 20 U/L (ref 0–44)
AST: 17 U/L (ref 15–41)
Albumin: 3.7 g/dL (ref 3.5–5.0)
Alkaline Phosphatase: 43 U/L (ref 38–126)
Anion gap: 7 (ref 5–15)
BUN: 8 mg/dL (ref 8–23)
CO2: 27 mmol/L (ref 22–32)
Calcium: 9 mg/dL (ref 8.9–10.3)
Chloride: 107 mmol/L (ref 98–111)
Creatinine, Ser: 0.66 mg/dL (ref 0.61–1.24)
GFR calc Af Amer: >60 mL/min (ref >60)
GFR calc non Af Amer: >60 mL/min (ref >60)
Glucose, Bld: 145 mg/dL — ABNORMAL HIGH (ref 70–99)
Potassium: 4 mmol/L (ref 3.5–5.1)
Sodium: 141 mmol/L (ref 135–145)
Total Bilirubin: 0.8 mg/dL (ref 0.3–1.2)
Total Protein: 6.5 g/dL (ref 6.5–8.1)

## 2020-04-12 LAB — CBC WITH DIFFERENTIAL/PLATELET
Abs Immature Granulocytes: 0.02 10*3/uL (ref 0.00–0.07)
Basophils Absolute: 0.1 10*3/uL (ref 0.0–0.1)
Basophils Relative: 1 %
Eosinophils Absolute: 0.1 10*3/uL (ref 0.0–0.5)
Eosinophils Relative: 2 %
HCT: 37.2 % — ABNORMAL LOW (ref 39.0–52.0)
Hemoglobin: 12.1 g/dL — ABNORMAL LOW (ref 13.0–17.0)
Immature Granulocytes: 1 %
Lymphocytes Relative: 27 %
Lymphs Abs: 1 10*3/uL (ref 0.7–4.0)
MCH: 32.8 pg (ref 26.0–34.0)
MCHC: 32.5 g/dL (ref 30.0–36.0)
MCV: 100.8 fL — ABNORMAL HIGH (ref 80.0–100.0)
Monocytes Absolute: 0.7 10*3/uL (ref 0.1–1.0)
Monocytes Relative: 18 %
Neutro Abs: 2 10*3/uL (ref 1.7–7.7)
Neutrophils Relative %: 51 %
Platelets: 272 10*3/uL (ref 150–400)
RBC: 3.69 MIL/uL — ABNORMAL LOW (ref 4.22–5.81)
RDW: 16.8 % — ABNORMAL HIGH (ref 11.5–15.5)
WBC: 3.8 10*3/uL — ABNORMAL LOW (ref 4.0–10.5)
nRBC: 0 % (ref 0.0–0.2)

## 2020-04-12 LAB — MAGNESIUM: Magnesium: 2.1 mg/dL (ref 1.7–2.4)

## 2020-04-12 MED ORDER — SODIUM CHLORIDE 0.9 % IV SOLN
2310.0000 mg/m2 | INTRAVENOUS | Status: DC
  Administered 2020-04-12: 5000 mg via INTRAVENOUS
  Filled 2020-04-12: qty 100

## 2020-04-12 MED ORDER — OXALIPLATIN CHEMO INJECTION 100 MG/20ML
68.0000 mg/m2 | Freq: Once | INTRAVENOUS | Status: AC
  Administered 2020-04-12: 150 mg via INTRAVENOUS
  Filled 2020-04-12: qty 30

## 2020-04-12 MED ORDER — LEUCOVORIN CALCIUM INJECTION 350 MG
400.0000 mg/m2 | Freq: Once | INTRAVENOUS | Status: AC
  Administered 2020-04-12: 868 mg via INTRAVENOUS
  Filled 2020-04-12: qty 10

## 2020-04-12 MED ORDER — FLUOROURACIL CHEMO INJECTION 2.5 GM/50ML
400.0000 mg/m2 | Freq: Once | INTRAVENOUS | Status: AC
  Administered 2020-04-12: 850 mg via INTRAVENOUS
  Filled 2020-04-12: qty 17

## 2020-04-12 MED ORDER — DEXTROSE 5 % IV SOLN: Freq: Once | INTRAVENOUS | Status: AC

## 2020-04-12 MED ORDER — SODIUM CHLORIDE 0.9 % IV SOLN
10.0000 mg | Freq: Once | INTRAVENOUS | Status: AC
  Administered 2020-04-12: 10 mg via INTRAVENOUS
  Filled 2020-04-12: qty 10

## 2020-04-12 MED ORDER — SODIUM CHLORIDE 0.9 % IV SOLN
240.0000 mg | Freq: Once | INTRAVENOUS | Status: AC
  Administered 2020-04-12: 240 mg via INTRAVENOUS
  Filled 2020-04-12: qty 24

## 2020-04-12 MED ORDER — PALONOSETRON HCL INJECTION 0.25 MG/5ML
0.2500 mg | Freq: Once | INTRAVENOUS | Status: AC
  Administered 2020-04-12: 0.25 mg via INTRAVENOUS
  Filled 2020-04-12: qty 5

## 2020-04-12 MED ORDER — SODIUM CHLORIDE 0.9 % IV SOLN: Freq: Once | INTRAVENOUS | Status: AC

## 2020-04-12 NOTE — Assessment & Plan Note (Signed)
1.  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  HER-2 FISH to be followed up.  PD-L1 CPS score 5%. -7 cycles of FOLFOX and Opdivo from 12/31/2019 through 03/24/2020. -PET scan on 03/22/2020 showed marked reduction in size and metabolic activity in the distal esophageal lesion.  Marked reduction in size of mediastinal adenopathy.  Near complete resolution with only 2 small mildly metabolic mediastinal lymph nodes remaining.  Resolution of lymph nodes in the upper abdomen.  Right hip bone lesion is still present but better. -I have reviewed his labs today.  LFTs are normal.  White count is slightly low at 3.8.  Platelet count is normal. -He will proceed with his next cycle today.  We will reevaluate him in 2 weeks.  2.  Nutrition: -He has not used the feeding tube in the last 2 months.  We discussed about discontinuing feeding tube.  He would like to think about it. -He tried to eat calamari over the weekend and had to regurgitate it.  Certain types of foods or difficult to swallow.  3.  Bone mets: -Zoledronic acid was started on 03/26/2020.  Calcium today is normal.  4.  Diabetes: -Continue Metformin 1000 mg twice daily.  5.  Anxiety: -Continue Xanax 0.5 mg as needed.  6.  Hypertension: -Blood pressure today is 117/69.  Continue Lopressor 25 mg twice daily.  Losartan on hold.

## 2020-04-12 NOTE — Patient Instructions (Signed)
[facility] at [facility] Discharge Instructions  You were seen today by Dr. Katragadda. He went over your recent lab results. He will see you back in 2 weeks for labs, treatment and follow up.   Thank you for choosing [facility] at [facility] to provide your oncology and hematology care.  To afford each patient quality time with our provider, please arrive at least 15 minutes before your scheduled appointment time.   If you have a lab appointment with the Cancer Center please come in thru the  Main Entrance and check in at the main information desk  You need to re-schedule your appointment should you arrive 10 or more minutes late.  We strive to give you quality time with our providers, and arriving late affects you and other patients whose appointments are after yours.  Also, if you no show three or more times for appointments you may be dismissed from the clinic at the providers discretion.     Again, thank you for choosing [facility].  Our hope is that these requests will decrease the amount of time that you wait before being seen by our physicians.       _____________________________________________________________  Should you have questions after your visit to  Cancer Center, please contact our office at [phone] between the hours of 8:00 a.m. and 4:30 p.m.  Voicemails left after 4:00 p.m. will not be returned until the following business day.  For prescription refill requests, have your pharmacy contact our office and allow 72 hours.    Cancer Center Support Programs:   > Cancer Support Group  2nd Tuesday of the month 1pm-2pm, Journey Room    

## 2020-04-12 NOTE — Progress Notes (Signed)
Patient presents today for treatment and follow up visit with Dr. Delton Coombes. Labs within parameters for treatment. Vital signs stable and within parameters for treatment. Patient has no complaints of any changes since his last visit.   Message received from Dr. Delton Coombes to proceed with treatment.   Treatment given today per MD orders. Tolerated infusion without adverse affects. Vital signs stable. No complaints at this time. 5FU pump infusing per protocol. RUN noted on screen and verified with patient.  Discharged from clinic ambulatory. F/U with [facility] as scheduled.

## 2020-04-12 NOTE — Progress Notes (Signed)
Patient has been assessed, vital signs and labs have been reviewed by Dr. Katragadda. ANC, Creatinine, LFTs, and Platelets are within treatment parameters per Dr. Katragadda. The patient is good to proceed with treatment at this time.  

## 2020-04-12 NOTE — Progress Notes (Signed)
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  CURRENT THERAPY: FOLFOX and Opdivo.  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[place])   12/29/2019 Cancer Staging   Staging form: Esophagus - Adenocarcinoma, AJCC 8th Edition - Clinical stage from 12/29/2019: Stage IVB (cTX, cN3, cM1) - Signed by Derek Jack, MD on 12/29/2019   12/31/2019 -  Chemotherapy   The patient had palonosetron (ALOXI) injection 0.25 mg, 0.25 mg, Intravenous,  Once, 8 of 12 cycles Administration: 0.25 mg (12/31/2019), 0.25 mg (01/14/2020), 0.25 mg (01/28/2020), 0.25 mg (02/11/2020), 0.25 mg (02/25/2020), 0.25 mg (03/10/2020), 0.25 mg (03/24/2020), 0.25 mg (04/12/2020) leucovorin 868 mg in dextrose 5 % 250 mL infusion, 400 mg/m2 = 868 mg, Intravenous,  Once, 8 of 12 cycles Administration: 868 mg (12/31/2019), 868 mg (01/14/2020), 868 mg (01/28/2020), 868 mg (02/11/2020), 868 mg (02/25/2020), 868 mg (03/10/2020), 868 mg (03/24/2020), 868 mg (04/12/2020) oxaliplatin (ELOXATIN) 150 mg in dextrose 5 % 500 mL chemo infusion, 68 mg/m2 = 150 mg (80 % of original dose 85 mg/m2), Intravenous,  Once, 8 of 12 cycles Dose modification: 68 mg/m2 (80 % of original dose 85 mg/m2, Cycle 1, Reason: Other (see comments), Comment: diabetic neuropathy) Administration: 150 mg (12/31/2019), 150 mg (01/14/2020), 150 mg (01/28/2020), 150 mg (02/11/2020), 150 mg (02/25/2020), 150 mg (03/10/2020), 150 mg (03/24/2020), 150 mg (04/12/2020) fluorouracil (ADRUCIL) chemo injection 850 mg, 400 mg/m2 = 850 mg, Intravenous,  Once, 8 of 12 cycles Administration: 850 mg  (12/31/2019), 850 mg (01/14/2020), 850 mg (01/28/2020), 850 mg (02/11/2020), 850 mg (02/25/2020), 850 mg (03/10/2020), 850 mg (03/24/2020), 850 mg (04/12/2020) fluorouracil (ADRUCIL) 5,000 mg in sodium chloride 0.9 % 150 mL chemo infusion, 2,310 mg/m2 = 5,200 mg, Intravenous, 1 Day/Dose, 8 of 12 cycles Administration: 5,000 mg (12/31/2019), 5,000 mg (01/14/2020), 5,000 mg (01/28/2020), 5,000 mg (02/11/2020), 5,000 mg (02/25/2020), 5,000 mg (03/10/2020), 5,000 mg (03/24/2020), 5,000 mg (04/12/2020) nivolumab (OPDIVO) 240 mg in sodium chloride 0.9 % 100 mL chemo infusion, 240 mg (100 % of original dose 240 mg), Intravenous, Once, 8 of 12 cycles Dose modification: 240 mg (original dose 240 mg, Cycle 1, Reason: Provider Judgment) Administration: 240 mg (12/31/2019), 240 mg (01/14/2020), 240 mg (01/28/2020), 240 mg (02/11/2020), 240 mg (02/25/2020), 240 mg (03/10/2020), 240 mg (03/24/2020), 240 mg (04/12/2020)  for chemotherapy treatment.       CANCER STAGING: Cancer Staging Adenocarcinoma of gastroesophageal junction [facility]) Staging form: Esophagus - Adenocarcinoma, AJCC 8th Edition - Clinical stage from 12/29/2019: Stage IVB (cTX, cN3, cM1) - Signed by Derek Jack, MD on 12/29/2019    INTERVAL HISTORY:  Timothy Oconnor 69 y.o. male seen for follow-up of GE junction adenocarcinoma.  Appetite is 100%.  Energy levels are 75%.  Had trouble eating calamari over the weekend and had to regurgitate it.  Denies any tingling or numbness in the extremities.  No fevers or chills reported.  REVIEW OF SYSTEMS:  Review of Systems  HENT:   Positive for trouble swallowing.   All other systems reviewed and are negative.    PAST MEDICAL/SURGICAL HISTORY:  Past Medical History:  Diagnosis Date  . 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  Anxiety   . Diabetes ([place])   . Heart attack ([place])   . Heart disease   . High cholesterol   . Hyperplasia of prostate   . Hypertension   . Low back pain   . Port-A-Cath in place  12/30/2019  . Tremor    Past Surgical History:  Procedure Laterality Date  . arterectomy     1993  . BIOPSY  12/01/2019   Procedure: BIOPSY;  Surgeon: Rogene Houston, MD;  Location: [facility];  Service: Endoscopy;;  esophagus  . ESOPHAGOGASTRODUODENOSCOPY (EGD) WITH PROPOFOL N/A 12/01/2019   Procedure: ESOPHAGOGASTRODUODENOSCOPY (EGD) WITH PROPOFOL;  Surgeon: Rogene Houston, MD;  Location: [facility] END[facility];  Service: Endoscopy;  Laterality: N/A;  . ESOPHAGOGASTRODUODENOSCOPY (EGD) WITH PROPOFOL N/A 12/24/2019   Procedure: ESOPHAGOGASTRODUODENOSCOPY (EGD) WITH PROPOFOL;  Surgeon: Aviva Signs, MD;  Location: [facility] ORS;  Service: General;  Laterality: N/A;  . Heart stents     2002, 2004, 2006  . PEG PLACEMENT N/A 12/24/2019   Procedure: PERCUTANEOUS ENDOSCOPIC GASTROSTOMY (PEG) PLACEMENT;  Surgeon: Aviva Signs, MD;  Location: [facility] ORS;  Service: General;  Laterality: N/A;  . PORTACATH PLACEMENT Left 12/24/2019   Procedure: INSERTION PORT-A-CATH;  Surgeon: Aviva Signs, MD;  Location: [facility] ORS;  Service: General;  Laterality: Left;     SOCIAL HISTORY:  Social History   Socioeconomic History  . Marital status: Married    Spouse name: Not on file  . Number of children: 2  . Years of education: 41  . Highest education level: Not on file  Occupational History  . Occupation: [employer]    Comment: Delivers medication  Tobacco Use  . Smoking status: Former Smoker    Packs/day: 1.00    Years: 50.00    Pack years: 50.00    Quit date: 12/21/2017    Years since quitting: 2.3  . Smokeless tobacco: Never Used  . Tobacco comment: Quit 2017  Substance and Sexual Activity  . Alcohol use: Yes    Alcohol/week: 0.0 standard drinks    Comment: 3 drinks per week  . Drug use: No  . Sexual activity: Yes  Other Topics Concern  . Not on file  Social History Narrative   Lives at home with his wife.   Right-handed.   2 diet cokes per day.   Social Determinants of Health   Financial Resource  Strain: Low Risk   . Difficulty of Paying Living Expenses: Not hard at all  Food Insecurity: No Food Insecurity  . Worried About Charity fundraiser in the Last Year: Never true  . Ran Out of Food in the Last Year: Never true  Transportation Needs: No Transportation Needs  . Lack of Transportation (Medical): No  . Lack of Transportation (Non-Medical): No  Physical Activity: Insufficiently Active  . Days of Exercise per Week: 2 days  . Minutes of Exercise per Session: 30 min  Stress: No Stress Concern Present  . Feeling of Stress : Only a little  Social Connections: Not Isolated  . Frequency of Communication with Friends and Family: More than three times a week  . Frequency of Social Gatherings with Friends and Family: More than three times a week  . Attends Religious Services: 1 to 4 times per year  . Active Member of Clubs or Organizations: No  . Attends Archivist Meetings: 1 to 4 times per year  . Marital Status: Married  Human resources officer Violence: Not At Risk  . Fear of Current or Ex-Partner:  No  . Emotionally Abused: No  . Physically Abused: No  . Sexually Abused: No    FAMILY HISTORY:  Family History  Problem Relation Age of Onset  . Alzheimer's disease Mother   . Diabetes Father   . Heart disease Father   . Stroke Father   . Heart disease Brother   . Colon cancer Brother   . Alzheimer's disease Sister     CURRENT MEDICATIONS:  Outpatient Encounter Medications as of 04/12/2020  Medication Sig Note  . ascorbic acid (VITAMIN C) 500 MG tablet Take 500 mg by mouth 2 (two) times a week.    Marland Kitchen aspirin EC 81 MG tablet Take 81 mg by mouth daily.    Marland Kitchen atorvastatin (LIPITOR) 80 MG tablet Take 80 mg by mouth every evening.    Marland Kitchen co-enzyme Q-10 30 MG capsule Take 30 mg by mouth daily.   Marland Kitchen ezetimibe (ZETIA) 10 MG tablet Take 10 mg by mouth daily.    Marland Kitchen FLUOROURACIL IV Inject into the vein every 14 (fourteen) days.   . folic acid (FOLVITE) 295 MCG tablet Take 800 mcg by  mouth daily.    . isosorbide mononitrate (IMDUR) 30 MG 24 hr tablet Take 30 mg by mouth daily.    . isosorbide mononitrate (IMDUR) 60 MG 24 hr tablet Take 60 mg by mouth daily.   Marland Kitchen LEUCOVORIN CALCIUM IV Inject into the vein every 14 (fourteen) days.   Marland Kitchen losartan (COZAAR) 50 MG tablet Take 25 mg by mouth daily. 02/25/2020: On hold  . metformin (FORTAMET) 1000 MG (OSM) 24 hr tablet Take 2 tablets (2,000 mg total) by mouth daily with breakfast.   . metoprolol tartrate (LOPRESSOR) 25 MG tablet Take 0.5 tablets (12.5 mg total) by mouth 2 (two) times daily. (Patient taking differently: Take 25 mg by mouth 2 (two) times daily. )   . Multiple Vitamin (MULTI-VITAMINS) TABS Take 1 tablet by mouth daily.    Marland Kitchen NIVOLUMAB IV Inject into the vein every 14 (fourteen) days.   . OXALIPLATIN IV Inject into the vein every 14 (fourteen) days.   . tamsulosin (FLOMAX) 0.4 MG CAPS capsule Take 0.4 mg by mouth daily.    . [DISCONTINUED] Glucosamine HCl (GLUCOSAMINE PO) Take 120 mg by mouth 2 (two) times daily. 12/19/2019: On hold  . ALPRAZolam (XANAX) 0.5 MG tablet Take 1 tablet (0.5 mg total) by mouth 2 (two) times daily as needed for anxiety. (Patient not taking: Reported on 04/12/2020) 01/08/2020: As needed and only used once.  Marland Kitchen HYDROcodone-acetaminophen (HYCET) 7.5-325 mg/15 ml solution Take 15 mLs by mouth every 4 (four) hours as needed for moderate pain. (Patient not taking: Reported on 04/12/2020)   . lidocaine-prilocaine (EMLA) cream Apply a dime-sized amount to port a cath site and cover with plastic wrap 1 hour prior to chemotherapy appointments (Patient not taking: Reported on 04/12/2020)   . prochlorperazine (COMPAZINE) 10 MG tablet Take 1 tablet (10 mg total) by mouth every 6 (six) hours as needed (Nausea or vomiting). (Patient not taking: Reported on 04/12/2020) 01/08/2020: Patient is taking as needed.  . [DISCONTINUED] Nutritional Supplements (FEEDING SUPPLEMENT, OSMOLITE 1.5 CAL,) LIQD Give 1 1/2 cartons of osmolite 4  times per day via PEG.  Flush with 42m of water before and after. Drink or give via tube additional 240 ml water TID. Provides 2130 calories, 89 g protein and 22845mfree water per day; meets 100% estimated needs.  Send formula and bolus supplies. (Patient not taking: Reported on 03/24/2020) 01/08/2020: Patient states  that he is using 6 times daily.   No facility-administered encounter medications on file as of 04/12/2020.    ALLERGIES:  No Known Allergies   PHYSICAL EXAM:  ECOG Performance status: 1  Vitals:   04/12/20 0817  BP: 117/69  Pulse: (!) 50  Resp: 18  Temp: 97.7 F (36.5 C)  SpO2: 99%   Filed Weights   04/12/20 0817  Weight: 213 lb 14.4 oz (97 kg)    Physical Exam Vitals reviewed.  Constitutional:      Appearance: Normal appearance.  Cardiovascular:     Rate and Rhythm: Normal rate and regular rhythm.     Heart sounds: Normal heart sounds.  Pulmonary:     Effort: Pulmonary effort is normal.     Breath sounds: Normal breath sounds.  Abdominal:     General: There is no distension.     Palpations: Abdomen is soft.  Skin:    General: Skin is warm.  Neurological:     General: No focal deficit present.     Mental Status: He is alert and oriented to person, place, and time.  Psychiatric:        Mood and Affect: Mood normal.        Behavior: Behavior normal.      LABORATORY DATA:  I have reviewed the labs as listed.  CBC    Component Value Date/Time   WBC 3.8 (L) 04/12/2020 0859   RBC 3.69 (L) 04/12/2020 0859   HGB 12.1 (L) 04/12/2020 0859   HCT 37.2 (L) 04/12/2020 0859   PLT 272 04/12/2020 0859   MCV 100.8 (H) 04/12/2020 0859   MCH 32.8 04/12/2020 0859   MCHC 32.5 04/12/2020 0859   RDW 16.8 (H) 04/12/2020 0859   LYMPHSABS 1.0 04/12/2020 0859   MONOABS 0.7 04/12/2020 0859   EOSABS 0.1 04/12/2020 0859   BASOSABS 0.1 04/12/2020 0859   CMP Latest Ref Rng & Units 04/12/2020 03/24/2020 03/10/2020  Glucose 70 - 99 mg/dL 145(H) 147(H) 158(H)  BUN 8 - 23  mg/dL _0 Creatinine 0.61 - 1.24 mg/dL 0.66 0.69 0.75  Sodium 135 - 145 mmol/L 141 140 138  Potassium 3.5 - 5.1 mmol/L 4.0 4.0 3.5  Chloride 98 - 111 mmol/L 107 107 106  CO2 22 - 32 mmol/L _1 Calcium 8.9 - 10.3 mg/dL 9.0 8.8(L) 8.7(L)  Total Protein 6.5 - 8.1 g/dL 6.5 6.4(L) 6.4(L)  Total Bilirubin 0.3 - 1.2 mg/dL 0.8 0.9 0.7  Alkaline Phos 38 - 126 U/L 43 42 39  AST 15 - 41 U/L _2 ALT 0 - 44 U/L _3 DIAGNOSTIC IMAGING:  I have reviewed scans.     ASSESSMENT & PLAN:   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HCC) 1.  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  HER-2 FISH to be followed up.  PD-L1 CPS score 5%. -7 cycles of FOLFOX and Opdivo from 12/31/2019 through 03/24/2020. -PET scan on 03/22/2020 showed marked reduction in size and metabolic activity in the distal esophageal lesion.  Marked reduction in size of mediastinal adenopathy.  Near complete resolution with only 2 small mildly metabolic mediastinal lymph nodes remaining.  Resolution of lymph nodes in the upper abdomen.  Right hip bone lesion is still present but better. -I have reviewed his labs today.  LFTs are normal.  White count is slightly low at 3.8.  Platelet count is normal. -He  will proceed with his next cycle today.  We will reevaluate him in 2 weeks.  2.  Nutrition: -He has not used the feeding tube in the last 2 months.  We discussed about discontinuing feeding tube.  He would like to think about it. -He tried to eat calamari over the weekend and had to regurgitate it.  Certain types of foods or difficult to swallow.  3.  Bone mets: -Zoledronic acid was started on 03/26/2020.  Calcium today is normal.  4.  Diabetes: -Continue Metformin 1000 mg twice daily.  5.  Anxiety: -Continue Xanax 0.5 mg as needed.  6.  Hypertension: -Blood pressure today is 117/69.  Continue Lopressor 25 mg twice daily.  Losartan on hold.      Orders  placed this encounter:  No orders of the defined types were placed in this encounter.    Derek Jack, MD [place] [phone]

## 2020-04-12 NOTE — Patient Instructions (Signed)
[facility] Discharge Instructions for Patients Receiving Chemotherapy  Today you received the following chemotherapy agents   To help prevent nausea and vomiting after your treatment, we encourage you to take your nausea medication   If you develop nausea and vomiting that is not controlled by your nausea medication, call the clinic.   BELOW ARE SYMPTOMS THAT SHOULD BE REPORTED IMMEDIATELY:  *FEVER GREATER THAN 100.5 F  *CHILLS WITH OR WITHOUT FEVER  NAUSEA AND VOMITING THAT IS NOT CONTROLLED WITH YOUR NAUSEA MEDICATION  *UNUSUAL SHORTNESS OF BREATH  *UNUSUAL BRUISING OR BLEEDING  TENDERNESS IN MOUTH AND THROAT WITH OR WITHOUT PRESENCE OF ULCERS  *URINARY PROBLEMS  *BOWEL PROBLEMS  UNUSUAL RASH Items with * indicate a potential emergency and should be followed up as soon as possible.  Feel free to call the clinic should you have any questions or concerns. The clinic phone number is [phone].  Please show the CHEMO ALERT CARD at check-in to the Emergency Department and triage nurse.   

## 2020-04-12 NOTE — Progress Notes (Signed)

## 2020-04-13 LAB — CEA: CEA: 2.7 ng/mL (ref 0.0–4.7)

## 2020-04-14 ENCOUNTER — Other Ambulatory Visit: Payer: Self-pay

## 2020-04-14 ENCOUNTER — Inpatient Hospital Stay (HOSPITAL_COMMUNITY): Payer: Medicare HMO

## 2020-04-14 VITALS — BP 122/68 | HR 68 | Temp 97.7°F | Resp 18

## 2020-04-14 DIAGNOSIS — Z95828 Presence of other vascular implants and grafts: Secondary | ICD-10-CM

## 2020-04-14 DIAGNOSIS — C160 Malignant neoplasm of cardia: Secondary | ICD-10-CM

## 2020-04-14 DIAGNOSIS — Z5112 Encounter for antineoplastic immunotherapy: Secondary | ICD-10-CM | POA: Diagnosis not present

## 2020-04-14 MED ORDER — HEPARIN SOD (PORK) LOCK FLUSH 100 UNIT/ML IV SOLN
500.0000 [IU] | Freq: Once | INTRAVENOUS | Status: AC | PRN
  Administered 2020-04-14: 500 [IU]

## 2020-04-14 MED ORDER — SODIUM CHLORIDE 0.9% FLUSH
10.0000 mL | INTRAVENOUS | Status: DC | PRN
  Administered 2020-04-14: 10 mL

## 2020-04-14 NOTE — Patient Instructions (Signed)
[facility] at [facility]  Discharge Instructions:   _______________________________________________________________  Thank you for choosing [facility] at [facility] to provide your oncology and hematology care.  To afford each patient quality time with our providers, please arrive at least 15 minutes before your scheduled appointment.  You need to re-schedule your appointment if you arrive 10 or more minutes late.  We strive to give you quality time with our providers, and arriving late affects you and other patients whose appointments are after yours.  Also, if you no show three or more times for appointments you may be dismissed from the clinic.  Again, thank you for choosing [facility] at [facility]. Our hope is that these requests will allow you access to exceptional care and in a timely manner. _______________________________________________________________  If you have questions after your visit, please contact our office at [phone] between the hours of 8:30 a.m. and 5:00 p.m. Voicemails left after 4:30 p.m. will not be returned until the following business day. _______________________________________________________________  For prescription refill requests, have your pharmacy contact our office. _______________________________________________________________  Recommendations made by the consultant and any test results will be sent to your referring physician. _______________________________________________________________ 

## 2020-04-14 NOTE — Progress Notes (Signed)
Timothy Oconnor returns today for port de access and flush after 46 hr continous infusion of 51fu. Tolerated infusion without problems. Portacath located left chest wall was  deaccessed and flushed with 37ml NS and 500U/36ml Heparin and needle removed intact.  Procedure without incident. Patient tolerated procedure well.  Vitals stable and discharged home from clinic ambulatory. Follow up as scheduled.

## 2020-04-16 ENCOUNTER — Telehealth (HOSPITAL_COMMUNITY): Payer: Self-pay

## 2020-04-16 ENCOUNTER — Inpatient Hospital Stay (HOSPITAL_COMMUNITY): Payer: Medicare HMO

## 2020-04-16 NOTE — Progress Notes (Signed)
Nutrition Follow-up:  Patient with stage IV esophageal adenocarcinoma with mets to bone.  Patient receiving chemotherapy.   Patient returned RD's call later this afternoon (3:15 pm) from previous voicemail left.  Patient reports appetite is good and eating well.  Had trouble eating calamari this past weekend and had to throw it back up.  "I have to take my time and eat slowly."    Continues to not use feeding tube.  Planning to leave tube in for now.      Medications: reviewed  Labs: reviewed  Anthropometrics:   Weight 213 lb 14.4 oz on 5/10 decreased from 218 lb 6.4 oz on 4/21   NUTRITION DIAGNOSIS: Inadequate oral intake improving   INTERVENTION:  Continue to eat well-balanced diet including good sources of protein. Flush tube with at least 19ml of water daily while not in use.  Patient has contact information    MONITORING, EVALUATION, GOAL: weight trends, intake   NEXT VISIT: June 11 phone f/u  Jaiyon Wander B. Zenia Resides, [place], [place] Registered Dietitian [phone] (pager)

## 2020-04-16 NOTE — Telephone Encounter (Addendum)
error 

## 2020-04-23 NOTE — Progress Notes (Signed)

## 2020-04-26 ENCOUNTER — Encounter (HOSPITAL_COMMUNITY): Payer: Self-pay | Admitting: Hematology

## 2020-04-26 ENCOUNTER — Inpatient Hospital Stay (HOSPITAL_COMMUNITY): Payer: Medicare HMO

## 2020-04-26 ENCOUNTER — Inpatient Hospital Stay (HOSPITAL_COMMUNITY): Payer: Medicare HMO | Admitting: Hematology

## 2020-04-26 ENCOUNTER — Other Ambulatory Visit: Payer: Self-pay

## 2020-04-26 DIAGNOSIS — C7951 Secondary malignant neoplasm of bone: Secondary | ICD-10-CM

## 2020-04-26 DIAGNOSIS — C160 Malignant neoplasm of cardia: Secondary | ICD-10-CM | POA: Diagnosis not present

## 2020-04-26 DIAGNOSIS — Z5112 Encounter for antineoplastic immunotherapy: Secondary | ICD-10-CM | POA: Diagnosis not present

## 2020-04-26 DIAGNOSIS — Z95828 Presence of other vascular implants and grafts: Secondary | ICD-10-CM

## 2020-04-26 LAB — COMPREHENSIVE METABOLIC PANEL
ALT: 22 U/L (ref 0–44)
AST: 22 U/L (ref 15–41)
Albumin: 3.6 g/dL (ref 3.5–5.0)
Alkaline Phosphatase: 41 U/L (ref 38–126)
Anion gap: 8 (ref 5–15)
BUN: 10 mg/dL (ref 8–23)
CO2: 25 mmol/L (ref 22–32)
Calcium: 8.5 mg/dL — ABNORMAL LOW (ref 8.9–10.3)
Chloride: 104 mmol/L (ref 98–111)
Creatinine, Ser: 0.88 mg/dL (ref 0.61–1.24)
GFR calc Af Amer: >60 mL/min (ref >60)
GFR calc non Af Amer: >60 mL/min (ref >60)
Glucose, Bld: 252 mg/dL — ABNORMAL HIGH (ref 70–99)
Potassium: 3.6 mmol/L (ref 3.5–5.1)
Sodium: 137 mmol/L (ref 135–145)
Total Bilirubin: 0.7 mg/dL (ref 0.3–1.2)
Total Protein: 6.3 g/dL — ABNORMAL LOW (ref 6.5–8.1)

## 2020-04-26 LAB — CBC WITH DIFFERENTIAL/PLATELET
Abs Immature Granulocytes: 0.01 10*3/uL (ref 0.00–0.07)
Basophils Absolute: 0 10*3/uL (ref 0.0–0.1)
Basophils Relative: 1 %
Eosinophils Absolute: 0.1 10*3/uL (ref 0.0–0.5)
Eosinophils Relative: 3 %
HCT: 34.7 % — ABNORMAL LOW (ref 39.0–52.0)
Hemoglobin: 11.4 g/dL — ABNORMAL LOW (ref 13.0–17.0)
Immature Granulocytes: 0 %
Lymphocytes Relative: 25 %
Lymphs Abs: 0.9 10*3/uL (ref 0.7–4.0)
MCH: 33.5 pg (ref 26.0–34.0)
MCHC: 32.9 g/dL (ref 30.0–36.0)
MCV: 102.1 fL — ABNORMAL HIGH (ref 80.0–100.0)
Monocytes Absolute: 0.4 10*3/uL (ref 0.1–1.0)
Monocytes Relative: 11 %
Neutro Abs: 2.1 10*3/uL (ref 1.7–7.7)
Neutrophils Relative %: 60 %
Platelets: 147 10*3/uL — ABNORMAL LOW (ref 150–400)
RBC: 3.4 MIL/uL — ABNORMAL LOW (ref 4.22–5.81)
RDW: 15.6 % — ABNORMAL HIGH (ref 11.5–15.5)
WBC: 3.5 10*3/uL — ABNORMAL LOW (ref 4.0–10.5)
nRBC: 0 % (ref 0.0–0.2)

## 2020-04-26 LAB — MAGNESIUM: Magnesium: 1.9 mg/dL (ref 1.7–2.4)

## 2020-04-26 MED ORDER — SODIUM CHLORIDE 0.9% FLUSH
10.0000 mL | INTRAVENOUS | Status: DC | PRN
  Administered 2020-04-26: 10 mL

## 2020-04-26 MED ORDER — OXALIPLATIN CHEMO INJECTION 100 MG/20ML
68.0000 mg/m2 | Freq: Once | INTRAVENOUS | Status: AC
  Administered 2020-04-26: 150 mg via INTRAVENOUS
  Filled 2020-04-26: qty 30

## 2020-04-26 MED ORDER — DEXTROSE 5 % IV SOLN: Freq: Once | INTRAVENOUS | Status: AC

## 2020-04-26 MED ORDER — SODIUM CHLORIDE 0.9 % IV SOLN
240.0000 mg | Freq: Once | INTRAVENOUS | Status: AC
  Administered 2020-04-26: 240 mg via INTRAVENOUS
  Filled 2020-04-26: qty 24

## 2020-04-26 MED ORDER — LEUCOVORIN CALCIUM INJECTION 350 MG
400.0000 mg/m2 | Freq: Once | INTRAVENOUS | Status: AC
  Administered 2020-04-26: 868 mg via INTRAVENOUS
  Filled 2020-04-26: qty 43.4

## 2020-04-26 MED ORDER — SODIUM CHLORIDE 0.9 % IV SOLN
10.0000 mg | Freq: Once | INTRAVENOUS | Status: AC
  Administered 2020-04-26: 10 mg via INTRAVENOUS
  Filled 2020-04-26: qty 10

## 2020-04-26 MED ORDER — PALONOSETRON HCL INJECTION 0.25 MG/5ML
0.2500 mg | Freq: Once | INTRAVENOUS | Status: AC
  Administered 2020-04-26: 0.25 mg via INTRAVENOUS
  Filled 2020-04-26: qty 5

## 2020-04-26 MED ORDER — FLUOROURACIL CHEMO INJECTION 2.5 GM/50ML
400.0000 mg/m2 | Freq: Once | INTRAVENOUS | Status: AC
  Administered 2020-04-26: 850 mg via INTRAVENOUS
  Filled 2020-04-26: qty 17

## 2020-04-26 MED ORDER — ZOLEDRONIC ACID 4 MG/100ML IV SOLN
4.0000 mg | Freq: Once | INTRAVENOUS | Status: AC
  Administered 2020-04-26: 4 mg via INTRAVENOUS
  Filled 2020-04-26: qty 100

## 2020-04-26 MED ORDER — SODIUM CHLORIDE 0.9 % IV SOLN: Freq: Once | INTRAVENOUS | Status: AC

## 2020-04-26 MED ORDER — SODIUM CHLORIDE 0.9 % IV SOLN
2310.0000 mg/m2 | INTRAVENOUS | Status: DC
  Administered 2020-04-26: 5000 mg via INTRAVENOUS
  Filled 2020-04-26: qty 100

## 2020-04-26 NOTE — Progress Notes (Signed)
Timothy Oconnor presents today for D1C9 FOLFOX, Opdivo, and Zometa. Patient denies any new symptoms or complaints since last visit. He states he did have some aching after his first dose of Zometa, in which I recommended Claritin to him to alleviate the symptoms. Vital signs and lab results stable at this time. He reports taking Ca and Vit D as instructed. Ca level 8.5 today. Pt denies tooth/jaw pain and denies recent or future invasive dental work. Lab results and vitals reviewed by Dr. Delton Coombes and patient assessed. Per MD, ok to proceed with treatment today.  Treatment tolerated without incident or complaint. 5FU infusing via home infusion pump through port without difficulties. Discharged in satisfactory condition with follow up instructions.

## 2020-04-26 NOTE — Assessment & Plan Note (Addendum)
Assessment: 1.  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  HER-2 FISH to be followed up.  PD-L1 CPS-5%. -FOLFOX and Opdivo started on 12/31/2019. -PET scan on 03/22/2020 showed marked reduction in size and metabolic activity in the distal esophageal lesion, marked reduction in size of mediastinal adenopathy.  Near complete resolution with only 2 small mildly metabolic mediastinal lymph nodes remaining.  Resolution of lymph nodes in the upper abdomen.  Right hip bone lesion is still present but better.  Plan: 1.  Stage IV GE junction adenocarcinoma: -CEA on 04/12/2020 was 2.7. -White count and platelets are adequate.  LFTs are normal.  Timothy Oconnor will proceed with Timothy Oconnor next cycle today. -Timothy Oconnor has some cold sensitivity for the first few days.  2.  Nutrition: -Timothy Oconnor has not used feeding tube in the last 2 to 3 months.  Timothy Oconnor has difficulty eating certain foods and is avoiding them.  Timothy Oconnor is actively gaining weight.  We will consider discontinuing feeding tube after next scans.  3.  Bone mets: -Zoledronic acid started on 03/26/2020 and Timothy Oconnor is tolerating well.  4.  Diabetes: -Continue Metformin 1000 mg twice daily.  5.  Anxiety: -Continue Xanax 0.5 mg as needed.  6.  Hypertension: -Blood pressure today is 117/65.  Continue Lopressor 25 mg twice daily.  Losartan on hold.

## 2020-04-26 NOTE — Patient Instructions (Addendum)
[place] at [facility] Discharge Instructions  You were seen today by Dr. Delton Coombes. He went over your recent lab results. He discussed your feeding tube, and suggested waiting until after the next scan to decide to remove your tube. He will see you back in 2 weeks for labs, treatment and follow up.   Thank you for choosing [place] at [facility] to provide your oncology and hematology care.  To afford each patient quality time with our provider, please arrive at least 15 minutes before your scheduled appointment time.   If you have a lab appointment with the [facility] please come in thru the  Main Entrance and check in at the main information desk  You need to re-schedule your appointment should you arrive 10 or more minutes late.  We strive to give you quality time with our providers, and arriving late affects you and other patients whose appointments are after yours.  Also, if you no show three or more times for appointments you may be dismissed from the clinic at the providers discretion.     Again, thank you for choosing [facility].  Our hope is that these requests will decrease the amount of time that you wait before being seen by our physicians.       _____________________________________________________________  Should you have questions after your visit to [facility], please contact our office at [phone] between the hours of 8:00 a.m. and 4:30 p.m.  Voicemails left after 4:00 p.m. will not be returned until the following business day.  For prescription refill requests, have your pharmacy contact our office and allow 72 hours.    Cancer Center Support Programs:   > Cancer Support Group  2nd Tuesday of the month 1pm-2pm, Journey Room

## 2020-04-26 NOTE — Progress Notes (Signed)
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  CURRENT THERAPY: FOLFOX and Opdivo.  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[place])   12/29/2019 Cancer Staging   Staging form: Esophagus - Adenocarcinoma, AJCC 8th Edition - Clinical stage from 12/29/2019: Stage IVB (cTX, cN3, cM1) - Signed by Derek Jack, MD on 12/29/2019   12/31/2019 -  Chemotherapy   The patient had palonosetron (ALOXI) injection 0.25 mg, 0.25 mg, Intravenous,  Once, 9 of 12 cycles Administration: 0.25 mg (12/31/2019), 0.25 mg (01/14/2020), 0.25 mg (01/28/2020), 0.25 mg (02/11/2020), 0.25 mg (02/25/2020), 0.25 mg (03/10/2020), 0.25 mg (03/24/2020), 0.25 mg (04/12/2020), 0.25 mg (04/26/2020) leucovorin 868 mg in dextrose 5 % 250 mL infusion, 400 mg/m2 = 868 mg, Intravenous,  Once, 9 of 12 cycles Administration: 868 mg (12/31/2019), 868 mg (01/14/2020), 868 mg (01/28/2020), 868 mg (02/11/2020), 868 mg (02/25/2020), 868 mg (03/10/2020), 868 mg (03/24/2020), 868 mg (04/12/2020), 868 mg (04/26/2020) oxaliplatin (ELOXATIN) 150 mg in dextrose 5 % 500 mL chemo infusion, 68 mg/m2 = 150 mg (80 % of original dose 85 mg/m2), Intravenous,  Once, 9 of 12 cycles Dose modification: 68 mg/m2 (80 % of original dose 85 mg/m2, Cycle 1, Reason: Other (see comments), Comment: diabetic neuropathy) Administration: 150 mg (12/31/2019), 150 mg (01/14/2020), 150 mg (01/28/2020), 150 mg (02/11/2020), 150 mg (02/25/2020), 150 mg (03/10/2020), 150 mg (03/24/2020), 150 mg (04/12/2020), 150 mg (04/26/2020) fluorouracil (ADRUCIL) chemo injection 850 mg, 400 mg/m2 = 850 mg,  Intravenous,  Once, 9 of 12 cycles Administration: 850 mg (12/31/2019), 850 mg (01/14/2020), 850 mg (01/28/2020), 850 mg (02/11/2020), 850 mg (02/25/2020), 850 mg (03/10/2020), 850 mg (03/24/2020), 850 mg (04/12/2020) fluorouracil (ADRUCIL) 5,000 mg in sodium chloride 0.9 % 150 mL chemo infusion, 2,310 mg/m2 = 5,200 mg, Intravenous, 1 Day/Dose, 9 of 12 cycles Administration: 5,000 mg (12/31/2019), 5,000 mg (01/14/2020), 5,000 mg (01/28/2020), 5,000 mg (02/11/2020), 5,000 mg (02/25/2020), 5,000 mg (03/10/2020), 5,000 mg (03/24/2020), 5,000 mg (04/12/2020) nivolumab (OPDIVO) 240 mg in sodium chloride 0.9 % 100 mL chemo infusion, 240 mg (100 % of original dose 240 mg), Intravenous, Once, 9 of 12 cycles Dose modification: 240 mg (original dose 240 mg, Cycle 1, Reason: Provider Judgment) Administration: 240 mg (12/31/2019), 240 mg (01/14/2020), 240 mg (01/28/2020), 240 mg (02/11/2020), 240 mg (02/25/2020), 240 mg (03/10/2020), 240 mg (03/24/2020), 240 mg (04/12/2020), 240 mg (04/26/2020)  for chemotherapy treatment.       CANCER STAGING: Cancer Staging Adenocarcinoma of gastroesophageal junction [facility]) Staging form: Esophagus - Adenocarcinoma, AJCC 8th Edition - Clinical stage from 12/29/2019: Stage IVB (cTX, cN3, cM1) - Signed by Derek Jack, MD on 12/29/2019    INTERVAL HISTORY:  Timothy Oconnor 69 y.o. male seen for follow-up of GE junction adenocarcinoma.  Appetite and energy levels are 100%.  Reports cold sensitivity lasting for couple of days after each treatment of the hands.  Has trouble swallowing to certain type of foods and avoiding them.  REVIEW OF SYSTEMS:  Review of Systems  HENT:   Positive for trouble swallowing.   All other systems reviewed and are negative.    PAST MEDICAL/SURGICAL HISTORY:  Past Medical History:  Diagnosis Date  . 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[place])   . Anxiety   . Diabetes ([place])   . Heart attack ([place])   . Heart disease   . High cholesterol   . Hyperplasia of  prostate   . Hypertension   . Low back pain   . Port-A-Cath in place 12/30/2019  . Tremor    Past Surgical History:  Procedure Laterality Date  . arterectomy     1993  . BIOPSY  12/01/2019   Procedure: BIOPSY;  Surgeon: Rogene Houston, MD;  Location: [facility] ENDO SU[facility];  Service: Endoscopy;;  esophagus  . ESOPHAGOGASTRODUODENOSCOPY (EGD) WITH PROPOFOL N/A 12/01/2019   Procedure: ESOPHAGOGASTRODUODENOSCOPY (EGD) WITH PROPOFOL;  Surgeon: Rogene Houston, MD;  Location: [facility] ENDO SUITE;  Service: Endoscopy;  Laterality: N/A;  . ESOPHAGOGASTRODUODENOSCOPY (EGD) WITH PROPOFOL N/A 12/24/2019   Procedure: ESOPHAGOGASTRODUODENOSCOPY (EGD) WITH PROPOFOL;  Surgeon: Aviva Signs, MD;  Location: [facility] ORS;  Service: General;  Laterality: N/A;  . Heart stents     2002, 2004, 2006  . PEG PLACEMENT N/A 12/24/2019   Procedure: PERCUTANEOUS ENDOSCOPIC GASTROSTOMY (PEG) PLACEMENT;  Surgeon: Aviva Signs, MD;  Location: [facility] ORS;  Service: General;  Laterality: N/A;  . PORTACATH PLACEMENT Left 12/24/2019   Procedure: INSERTION PORT-A-CATH;  Surgeon: Aviva Signs, MD;  Location: [facility] ORS;  Service: General;  Laterality: Left;     SOCIAL HISTORY:  Social History   Socioeconomic History  . Marital status: Married    Spouse name: Not on file  . Number of children: 2  . Years of education: 62  . Highest education level: Not on file  Occupational History  . Occupation: [employer]    Comment: Delivers medication  Tobacco Use  . Smoking status: Former Smoker    Packs/day: 1.00    Years: 50.00    Pack years: 50.00    Quit date: 12/21/2017    Years since quitting: 2.3  . Smokeless tobacco: Never Used  . Tobacco comment: Quit 2017  Substance and Sexual Activity  . Alcohol use: Yes    Alcohol/week: 0.0 standard drinks    Comment: 3 drinks per week  . Drug use: No  . Sexual activity: Yes  Other Topics Concern  . Not on file  Social History Narrative   Lives at home with his wife.   Right-handed.   2 diet  cokes per day.   Social Determinants of Health   Financial Resource Strain: Low Risk   . Difficulty of Paying Living Expenses: Not hard at all  Food Insecurity: No Food Insecurity  . Worried About Charity fundraiser in the Last Year: Never true  . Ran Out of Food in the Last Year: Never true  Transportation Needs: No Transportation Needs  . Lack of Transportation (Medical): No  . Lack of Transportation (Non-Medical): No  Physical Activity: Insufficiently Active  . Days of Exercise per Week: 2 days  . Minutes of Exercise per Session: 30 min  Stress: No Stress Concern Present  . Feeling of Stress : Only a little  Social Connections: Not Isolated  . Frequency of Communication with Friends and Family: More than three times a week  . Frequency of Social Gatherings with Friends and Family: More than three times a week  . Attends Religious Services: 1 to 4 times per year  . Active Member of Clubs or Organizations: No  . Attends Archivist Meetings: 1 to 4 times per year  . Marital Status: Married  Intimate  Partner Violence: Not At Risk  . Fear of Current or Ex-Partner: No  . Emotionally Abused: No  . Physically Abused: No  . Sexually Abused: No    FAMILY HISTORY:  Family History  Problem Relation Age of Onset  . Alzheimer's disease Mother   . Diabetes Father   . Heart disease Father   . Stroke Father   . Heart disease Brother   . Colon cancer Brother   . Alzheimer's disease Sister     CURRENT MEDICATIONS:  Outpatient Encounter Medications as of 04/26/2020  Medication Sig Note  . ascorbic acid (VITAMIN C) 500 MG tablet Take 500 mg by mouth 2 (two) times a week.    Marland Kitchen aspirin EC 81 MG tablet Take 81 mg by mouth daily.    Marland Kitchen atorvastatin (LIPITOR) 80 MG tablet Take 80 mg by mouth every evening.    Marland Kitchen co-enzyme Q-10 30 MG capsule Take 30 mg by mouth daily.   Marland Kitchen ezetimibe (ZETIA) 10 MG tablet Take 10 mg by mouth daily.    Marland Kitchen FLUOROURACIL IV Inject into the vein every 14  (fourteen) days.   . folic acid (FOLVITE) 166 MCG tablet Take 800 mcg by mouth daily.    . isosorbide mononitrate (IMDUR) 60 MG 24 hr tablet Take 60 mg by mouth daily.   Marland Kitchen LEUCOVORIN CALCIUM IV Inject into the vein every 14 (fourteen) days.   Marland Kitchen losartan (COZAAR) 50 MG tablet Take 25 mg by mouth daily.   . metFORMIN (GLUMETZA) 1000 MG (MOD) 24 hr tablet Take 1,000 mg by mouth daily with breakfast.   . metoprolol tartrate (LOPRESSOR) 25 MG tablet Take 0.5 tablets (12.5 mg total) by mouth 2 (two) times daily. (Patient taking differently: Take 25 mg by mouth 2 (two) times daily. )   . Multiple Vitamin (MULTI-VITAMINS) TABS Take 1 tablet by mouth daily.    Marland Kitchen NIVOLUMAB IV Inject into the vein every 14 (fourteen) days.   . OXALIPLATIN IV Inject into the vein every 14 (fourteen) days.   . tamsulosin (FLOMAX) 0.4 MG CAPS capsule Take 0.4 mg by mouth daily.    . [DISCONTINUED] metformin (FORTAMET) 1000 MG (OSM) 24 hr tablet Take 2 tablets (2,000 mg total) by mouth daily with breakfast.   . [DISCONTINUED] metFORMIN (GLUCOPHAGE-XR) 500 MG 24 hr tablet Take 2,000 mg by mouth every morning.   Marland Kitchen ALPRAZolam (XANAX) 0.5 MG tablet Take 1 tablet (0.5 mg total) by mouth 2 (two) times daily as needed for anxiety. (Patient not taking: Reported on 04/12/2020) 01/08/2020: As needed and only used once.  Marland Kitchen HYDROcodone-acetaminophen (HYCET) 7.5-325 mg/15 ml solution Take 15 mLs by mouth every 4 (four) hours as needed for moderate pain. (Patient not taking: Reported on 04/26/2020)   . lidocaine-prilocaine (EMLA) cream Apply a dime-sized amount to port a cath site and cover with plastic wrap 1 hour prior to chemotherapy appointments (Patient not taking: Reported on 04/26/2020)   . prochlorperazine (COMPAZINE) 10 MG tablet Take 1 tablet (10 mg total) by mouth every 6 (six) hours as needed (Nausea or vomiting). (Patient not taking: Reported on 04/26/2020) 01/08/2020: Patient is taking as needed.  . [DISCONTINUED] isosorbide mononitrate  (IMDUR) 30 MG 24 hr tablet Take 30 mg by mouth daily.     No facility-administered encounter medications on file as of 04/26/2020.    ALLERGIES:  No Known Allergies   PHYSICAL EXAM:  ECOG Performance status: 1  Vitals:   04/26/20 0828  BP: 117/65  Pulse: 67  Resp: 18  Temp: (!) 96.9 F (36.1 C)  SpO2: 98%   Filed Weights   04/26/20 0828  Weight: 212 lb 8 oz (96.4 kg)    Physical Exam Vitals reviewed.  Constitutional:      Appearance: Normal appearance.  Cardiovascular:     Rate and Rhythm: Normal rate and regular rhythm.     Heart sounds: Normal heart sounds.  Pulmonary:     Effort: Pulmonary effort is normal.     Breath sounds: Normal breath sounds.  Abdominal:     General: There is no distension.     Palpations: Abdomen is soft.  Skin:    General: Skin is warm.  Neurological:     General: No focal deficit present.     Mental Status: He is alert and oriented to person, place, and time.  Psychiatric:        Mood and Affect: Mood normal.        Behavior: Behavior normal.      LABORATORY DATA:  I have reviewed the labs as listed.  CBC    Component Value Date/Time   WBC 3.5 (L) 04/26/2020 0801   RBC 3.40 (L) 04/26/2020 0801   HGB 11.4 (L) 04/26/2020 0801   HCT 34.7 (L) 04/26/2020 0801   PLT 147 (L) 04/26/2020 0801   MCV 102.1 (H) 04/26/2020 0801   MCH 33.5 04/26/2020 0801   MCHC 32.9 04/26/2020 0801   RDW 15.6 (H) 04/26/2020 0801   LYMPHSABS 0.9 04/26/2020 0801   MONOABS 0.4 04/26/2020 0801   EOSABS 0.1 04/26/2020 0801   BASOSABS 0.0 04/26/2020 0801   CMP Latest Ref Rng & Units 04/26/2020 04/12/2020 03/24/2020  Glucose 70 - 99 mg/dL 252(H) 145(H) 147(H)  BUN 8 - 23 mg/dL _0 Creatinine 0.61 - 1.24 mg/dL 0.88 0.66 0.69  Sodium 135 - 145 mmol/L 137 141 140  Potassium 3.5 - 5.1 mmol/L 3.6 4.0 4.0  Chloride 98 - 111 mmol/L 104 107 107  CO2 22 - 32 mmol/L _1 Calcium 8.9 - 10.3 mg/dL 8.5(L) 9.0 8.8(L)  Total Protein 6.5 - 8.1 g/dL 6.3(L)  6.5 6.4(L)  Total Bilirubin 0.3 - 1.2 mg/dL 0.7 0.8 0.9  Alkaline Phos 38 - 126 U/L 41 43 42  AST 15 - 41 U/L _2 ALT 0 - 44 U/L _3 DIAGNOSTIC IMAGING:  I have reviewed scans.     ASSESSMENT & PLAN:   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HCC) Assessment: 1.  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  HER-2 FISH to be followed up.  PD-L1 CPS-5%. -FOLFOX and Opdivo started on 12/31/2019. -PET scan on 03/22/2020 showed marked reduction in size and metabolic activity in the distal esophageal lesion, marked reduction in size of mediastinal adenopathy.  Near complete resolution with only 2 small mildly metabolic mediastinal lymph nodes remaining.  Resolution of lymph nodes in the upper abdomen.  Right hip bone lesion is still present but better.  Plan: 1.  Stage IV GE junction adenocarcinoma: -CEA on 04/12/2020 was 2.7. -White count and platelets are adequate.  LFTs are normal.  He will proceed with his next cycle today. -He has some cold sensitivity for the first few days.  2.  Nutrition: -He has not used feeding tube in the last 2 to 3 months.  He has difficulty eating certain foods and is avoiding them.  He is actively gaining weight.  We will consider  discontinuing feeding tube after next scans.  3.  Bone mets: -Zoledronic acid started on 03/26/2020 and he is tolerating well.  4.  Diabetes: -Continue Metformin 1000 mg twice daily.  5.  Anxiety: -Continue Xanax 0.5 mg as needed.  6.  Hypertension: -Blood pressure today is 117/65.  Continue Lopressor 25 mg twice daily.  Losartan on hold.      Orders placed this encounter:  No orders of the defined types were placed in this encounter.    Derek Jack, MD [place] [phone]

## 2020-04-26 NOTE — Patient Instructions (Signed)
[facility] at [facility] Discharge Instructions  Labs drawn from portacath today   Thank you for choosing [facility] at [facility] to provide your oncology and hematology care.  To afford each patient quality time with our provider, please arrive at least 15 minutes before your scheduled appointment time.   If you have a lab appointment with the Cancer Center please come in thru the Main Entrance and check in at the main information desk.  You need to re-schedule your appointment should you arrive 10 or more minutes late.  We strive to give you quality time with our providers, and arriving late affects you and other patients whose appointments are after yours.  Also, if you no show three or more times for appointments you may be dismissed from the clinic at the providers discretion.     Again, thank you for choosing [facility].  Our hope is that these requests will decrease the amount of time that you wait before being seen by our physicians.       _____________________________________________________________  Should you have questions after your visit to [facility], please contact our office at [phone] between the hours of 8:00 a.m. and 4:30 p.m.  Voicemails left after 4:00 p.m. will not be returned until the following business day.  For prescription refill requests, have your pharmacy contact our office and allow 72 hours.    Due to Covid, you will need to wear a mask upon entering the hospital. If you do not have a mask, a mask will be given to you at the Main Entrance upon arrival. For doctor visits, patients may have 1 support person with them. For treatment visits, patients can not have anyone with them due to social distancing guidelines and our immunocompromised population.     

## 2020-04-26 NOTE — Patient Instructions (Signed)
[facility] Discharge Instructions for Patients Receiving Chemotherapy   Beginning January 23rd 2017 lab work for the [facility] will be done in the  Main lab at [facility] on 1st floor. If you have a lab appointment with the [facility] please come in thru the  Main Entrance and check in at the main information desk   Today you received the following chemotherapy agents Opdivo, Oxaliplatin, Leucovorin, and 5FU  To help prevent nausea and vomiting after your treatment, we encourage you to take your nausea medication   If you develop nausea and vomiting, or diarrhea that is not controlled by your medication, call the clinic.  The clinic phone number is [phone]. Office hours are Monday-Friday 8:30am-5:00pm.  BELOW ARE SYMPTOMS THAT SHOULD BE REPORTED IMMEDIATELY:  *FEVER GREATER THAN 101.0 F  *CHILLS WITH OR WITHOUT FEVER  NAUSEA AND VOMITING THAT IS NOT CONTROLLED WITH YOUR NAUSEA MEDICATION  *UNUSUAL SHORTNESS OF BREATH  *UNUSUAL BRUISING OR BLEEDING  TENDERNESS IN MOUTH AND THROAT WITH OR WITHOUT PRESENCE OF ULCERS  *URINARY PROBLEMS  *BOWEL PROBLEMS  UNUSUAL RASH Items with * indicate a potential emergency and should be followed up as soon as possible. If you have an emergency after office hours please contact your primary care physician or go to the nearest emergency department.  Please call the clinic during office hours if you have any questions or concerns.   You may also contact the Patient Navigator at [phone] should you have any questions or need assistance in obtaining follow up care.      Resources For Cancer Patients and their Caregivers ? American Cancer Society: Can assist with transportation, wigs, general needs, runs Look Good Feel Better.        225-196-9925 ? Cancer Care: Provides financial assistance, online support groups, medication/co-pay assistance.  1-800-813-HOPE 416-835-7707) ? [place] Assists [place] cancer patients and their families through emotional , educational and financial support.  615-319-5802 ? [place] DSS Where to apply for food stamps, Medicaid and utility assistance. 725 850 1917 ? [facility]: Transportation to medical appointments. (920)233-2429 ? Social Security Administration: May apply for disability if have a Stage IV cancer. 2157864308 684-462-7800 ? [employer], Disability and Transit Services: Assists with nutrition, care and transit needs. 4072738873

## 2020-04-28 ENCOUNTER — Encounter (HOSPITAL_COMMUNITY): Payer: Self-pay

## 2020-04-28 ENCOUNTER — Other Ambulatory Visit: Payer: Self-pay

## 2020-04-28 ENCOUNTER — Inpatient Hospital Stay (HOSPITAL_COMMUNITY): Payer: Medicare HMO

## 2020-04-28 VITALS — BP 96/54 | HR 54 | Temp 98.1°F | Resp 18

## 2020-04-28 DIAGNOSIS — Z95828 Presence of other vascular implants and grafts: Secondary | ICD-10-CM

## 2020-04-28 DIAGNOSIS — Z5112 Encounter for antineoplastic immunotherapy: Secondary | ICD-10-CM | POA: Diagnosis not present

## 2020-04-28 DIAGNOSIS — C160 Malignant neoplasm of cardia: Secondary | ICD-10-CM

## 2020-04-28 MED ORDER — HEPARIN SOD (PORK) LOCK FLUSH 100 UNIT/ML IV SOLN
500.0000 [IU] | Freq: Once | INTRAVENOUS | Status: AC | PRN
  Administered 2020-04-28: 500 [IU]

## 2020-04-28 MED ORDER — SODIUM CHLORIDE 0.9 % IV SOLN: INTRAVENOUS | Status: DC

## 2020-04-28 MED ORDER — SODIUM CHLORIDE 0.9% FLUSH
10.0000 mL | INTRAVENOUS | Status: DC | PRN
  Administered 2020-04-28: 10 mL

## 2020-04-28 NOTE — Progress Notes (Signed)
1125 5FU pump discontinued with portacath flushed easily per protocol. B/P lower than normal but pt feels fine and he did take his blood pressure medication this morning. Discussed with RLockamy NP and pt to receive 500 ml NS over 30 minutes at this time.         Lynnae Prude tolerated IV hydration well without complaints or incident. VSS B/P 96/52 now. Pt discharged self ambulatory in satisfactory condition

## 2020-04-28 NOTE — Patient Instructions (Addendum)
[place] at [facility] Discharge Instructions  5FU pump discontinued with portacath flushed per protocol and pt received IV hydration as well. Follow-up as scheduled   Thank you for choosing [place] at [facility] to provide your oncology and hematology care.  To afford each patient quality time with our provider, please arrive at least 15 minutes before your scheduled appointment time.   If you have a lab appointment with the [facility] please come in thru the Main Entrance and check in at the main information desk.  You need to re-schedule your appointment should you arrive 10 or more minutes late.  We strive to give you quality time with our providers, and arriving late affects you and other patients whose appointments are after yours.  Also, if you no show three or more times for appointments you may be dismissed from the clinic at the providers discretion.     Again, thank you for choosing [facility].  Our hope is that these requests will decrease the amount of time that you wait before being seen by our physicians.       _____________________________________________________________  Should you have questions after your visit to [facility], please contact our office at [phone] between the hours of 8:00 a.m. and 4:30 p.m.  Voicemails left after 4:00 p.m. will not be returned until the following business day.  For prescription refill requests, have your pharmacy contact our office and allow 72 hours.    Due to Covid, you will need to wear a mask upon entering the hospital. If you do not have a mask, a mask will be given to you at the Main Entrance upon arrival. For doctor visits, patients may have 1 support person with them. For treatment visits, patients can not have anyone with them due to social distancing guidelines and our immunocompromised population.

## 2020-04-30 ENCOUNTER — Encounter (HOSPITAL_COMMUNITY): Payer: Medicare HMO

## 2020-05-07 NOTE — Progress Notes (Signed)

## 2020-05-10 ENCOUNTER — Inpatient Hospital Stay (HOSPITAL_COMMUNITY): Payer: Medicare HMO

## 2020-05-10 ENCOUNTER — Inpatient Hospital Stay (HOSPITAL_COMMUNITY): Payer: Medicare HMO | Admitting: Hematology

## 2020-05-10 ENCOUNTER — Inpatient Hospital Stay (HOSPITAL_COMMUNITY): Payer: Medicare HMO | Attending: Hematology

## 2020-05-10 ENCOUNTER — Encounter (HOSPITAL_COMMUNITY): Payer: Self-pay | Admitting: *Deleted

## 2020-05-10 VITALS — BP 95/58 | HR 53 | Temp 97.3°F | Resp 19 | Wt 212.6 lb

## 2020-05-10 VITALS — BP 139/84 | HR 54 | Temp 97.7°F | Resp 18

## 2020-05-10 DIAGNOSIS — Z95828 Presence of other vascular implants and grafts: Secondary | ICD-10-CM

## 2020-05-10 DIAGNOSIS — Z79899 Other long term (current) drug therapy: Secondary | ICD-10-CM | POA: Diagnosis not present

## 2020-05-10 DIAGNOSIS — Z5111 Encounter for antineoplastic chemotherapy: Secondary | ICD-10-CM | POA: Insufficient documentation

## 2020-05-10 DIAGNOSIS — C7951 Secondary malignant neoplasm of bone: Secondary | ICD-10-CM | POA: Insufficient documentation

## 2020-05-10 DIAGNOSIS — C160 Malignant neoplasm of cardia: Secondary | ICD-10-CM | POA: Diagnosis not present

## 2020-05-10 LAB — COMPREHENSIVE METABOLIC PANEL
ALT: 22 U/L (ref 0–44)
AST: 18 U/L (ref 15–41)
Albumin: 3.5 g/dL (ref 3.5–5.0)
Alkaline Phosphatase: 40 U/L (ref 38–126)
Anion gap: 9 (ref 5–15)
BUN: 11 mg/dL (ref 8–23)
CO2: 25 mmol/L (ref 22–32)
Calcium: 8.8 mg/dL — ABNORMAL LOW (ref 8.9–10.3)
Chloride: 104 mmol/L (ref 98–111)
Creatinine, Ser: 0.69 mg/dL (ref 0.61–1.24)
GFR calc Af Amer: >60 mL/min (ref >60)
GFR calc non Af Amer: >60 mL/min (ref >60)
Glucose, Bld: 226 mg/dL — ABNORMAL HIGH (ref 70–99)
Potassium: 3.6 mmol/L (ref 3.5–5.1)
Sodium: 138 mmol/L (ref 135–145)
Total Bilirubin: 0.5 mg/dL (ref 0.3–1.2)
Total Protein: 6.3 g/dL — ABNORMAL LOW (ref 6.5–8.1)

## 2020-05-10 LAB — CBC WITH DIFFERENTIAL/PLATELET
Abs Immature Granulocytes: 0.01 10*3/uL (ref 0.00–0.07)
Basophils Absolute: 0 10*3/uL (ref 0.0–0.1)
Basophils Relative: 1 %
Eosinophils Absolute: 0.1 10*3/uL (ref 0.0–0.5)
Eosinophils Relative: 2 %
HCT: 34.5 % — ABNORMAL LOW (ref 39.0–52.0)
Hemoglobin: 11.2 g/dL — ABNORMAL LOW (ref 13.0–17.0)
Immature Granulocytes: 0 %
Lymphocytes Relative: 27 %
Lymphs Abs: 1 10*3/uL (ref 0.7–4.0)
MCH: 33 pg (ref 26.0–34.0)
MCHC: 32.5 g/dL (ref 30.0–36.0)
MCV: 101.8 fL — ABNORMAL HIGH (ref 80.0–100.0)
Monocytes Absolute: 0.5 10*3/uL (ref 0.1–1.0)
Monocytes Relative: 12 %
Neutro Abs: 2.1 10*3/uL (ref 1.7–7.7)
Neutrophils Relative %: 58 %
Platelets: 140 10*3/uL — ABNORMAL LOW (ref 150–400)
RBC: 3.39 MIL/uL — ABNORMAL LOW (ref 4.22–5.81)
RDW: 15.1 % (ref 11.5–15.5)
WBC: 3.7 10*3/uL — ABNORMAL LOW (ref 4.0–10.5)
nRBC: 0 % (ref 0.0–0.2)

## 2020-05-10 LAB — MAGNESIUM: Magnesium: 1.8 mg/dL (ref 1.7–2.4)

## 2020-05-10 LAB — TSH: TSH: 2.131 u[IU]/mL (ref 0.350–4.500)

## 2020-05-10 MED ORDER — SODIUM CHLORIDE 0.9 % IV SOLN
2310.0000 mg/m2 | INTRAVENOUS | Status: DC
  Administered 2020-05-10: 5000 mg via INTRAVENOUS
  Filled 2020-05-10: qty 100

## 2020-05-10 MED ORDER — SODIUM CHLORIDE 0.9 % IV SOLN
10.0000 mg | Freq: Once | INTRAVENOUS | Status: AC
  Administered 2020-05-10: 10 mg via INTRAVENOUS
  Filled 2020-05-10: qty 10

## 2020-05-10 MED ORDER — SODIUM CHLORIDE 0.9 % IV SOLN
240.0000 mg | Freq: Once | INTRAVENOUS | Status: AC
  Administered 2020-05-10: 240 mg via INTRAVENOUS
  Filled 2020-05-10: qty 24

## 2020-05-10 MED ORDER — DEXTROSE 5 % IV SOLN: Freq: Once | INTRAVENOUS | Status: AC

## 2020-05-10 MED ORDER — FLUOROURACIL CHEMO INJECTION 2.5 GM/50ML
400.0000 mg/m2 | Freq: Once | INTRAVENOUS | Status: AC
  Administered 2020-05-10: 850 mg via INTRAVENOUS
  Filled 2020-05-10: qty 17

## 2020-05-10 MED ORDER — SODIUM CHLORIDE 0.9 % IV SOLN: Freq: Once | INTRAVENOUS | Status: AC

## 2020-05-10 MED ORDER — PALONOSETRON HCL INJECTION 0.25 MG/5ML
0.2500 mg | Freq: Once | INTRAVENOUS | Status: AC
  Administered 2020-05-10: 0.25 mg via INTRAVENOUS
  Filled 2020-05-10: qty 5

## 2020-05-10 MED ORDER — SODIUM CHLORIDE 0.9% FLUSH
10.0000 mL | INTRAVENOUS | Status: DC | PRN
  Administered 2020-05-10: 10 mL

## 2020-05-10 MED ORDER — LEUCOVORIN CALCIUM INJECTION 350 MG
400.0000 mg/m2 | Freq: Once | INTRAVENOUS | Status: AC
  Administered 2020-05-10: 868 mg via INTRAVENOUS
  Filled 2020-05-10: qty 43.4

## 2020-05-10 MED ORDER — OXALIPLATIN CHEMO INJECTION 100 MG/20ML
68.0000 mg/m2 | Freq: Once | INTRAVENOUS | Status: AC
  Administered 2020-05-10: 150 mg via INTRAVENOUS
  Filled 2020-05-10: qty 10

## 2020-05-10 NOTE — Progress Notes (Signed)
Timothy Oconnor presents today for D1C10 Opdivo and FOLFOX. Pt denies any new symptoms or changes since last treatment. Him and his wife just returned from a trip to the beach. Vital signs and lab results stable. Assessed by Dr. Delton Coombes who has approved proceeding with treatment today.  Treatment tolerated without incident or complaint. VSS upon completion of treatment. 5FU infusing via home infusion pump through port without difficulties. Discharged in satisfactory condition with follow up instructions.

## 2020-05-10 NOTE — Patient Instructions (Signed)
[facility] Discharge Instructions for Patients Receiving Chemotherapy   Beginning January 23rd 2017 lab work for the [facility] will be done in the  Main lab at [facility] on 1st floor. If you have a lab appointment with the [place] [facility] please come in thru the  Main Entrance and check in at the main information desk   Today you received the following chemotherapy agents Opdivo and FOLFOX  To help prevent nausea and vomiting after your treatment, we encourage you to take your nausea medication   If you develop nausea and vomiting, or diarrhea that is not controlled by your medication, call the clinic.  The clinic phone number is [phone]. Office hours are Monday-Friday 8:30am-5:00pm.  BELOW ARE SYMPTOMS THAT SHOULD BE REPORTED IMMEDIATELY:  *FEVER GREATER THAN 101.0 F  *CHILLS WITH OR WITHOUT FEVER  NAUSEA AND VOMITING THAT IS NOT CONTROLLED WITH YOUR NAUSEA MEDICATION  *UNUSUAL SHORTNESS OF BREATH  *UNUSUAL BRUISING OR BLEEDING  TENDERNESS IN MOUTH AND THROAT WITH OR WITHOUT PRESENCE OF ULCERS  *URINARY PROBLEMS  *BOWEL PROBLEMS  UNUSUAL RASH Items with * indicate a potential emergency and should be followed up as soon as possible. If you have an emergency after office hours please contact your primary care physician or go to the nearest emergency department.  Please call the clinic during office hours if you have any questions or concerns.   You may also contact the Patient Navigator at [phone] should you have any questions or need assistance in obtaining follow up care.      Resources For Cancer Patients and their Caregivers ? American Cancer Society: Can assist with transportation, wigs, general needs, runs Look Good Feel Better.        (430)798-9290 ? Cancer Care: Provides financial assistance, online support groups, medication/co-pay assistance.  1-800-813-HOPE (531) 493-9337) ? [place] Assists [place] cancer patients and their families through emotional , educational and financial support.  520-090-3302 ? [place] DSS Where to apply for food stamps, Medicaid and utility assistance. 313-220-8710 ? [facility]: Transportation to medical appointments. 707 633 0880 ? Social Security Administration: May apply for disability if have a Stage IV cancer. 320-589-7542 (702)219-6295 ? [employer], Disability and Transit Services: Assists with nutrition, care and transit needs. 405-007-2898

## 2020-05-10 NOTE — Patient Instructions (Signed)
[place] at [facility] Discharge Instructions  You were seen today by Dr. Delton Coombes. He went over your recent results. Please take 1/4 of a pill of your losartan every day. You will be scheduled for a PET scan in 4 weeks. Dr. Delton Coombes will see you back in 4 weeks for labs and follow up.   Thank you for choosing [place] at [facility] to provide your oncology and hematology care.  To afford each patient quality time with our provider, please arrive at least 15 minutes before your scheduled appointment time.   If you have a lab appointment with the [facility] please come in thru the  Main Entrance and check in at the main information desk  You need to re-schedule your appointment should you arrive 10 or more minutes late.  We strive to give you quality time with our providers, and arriving late affects you and other patients whose appointments are after yours.  Also, if you no show three or more times for appointments you may be dismissed from the clinic at the providers discretion.     Again, thank you for choosing [facility].  Our hope is that these requests will decrease the amount of time that you wait before being seen by our physicians.       _____________________________________________________________  Should you have questions after your visit to [facility], please contact our office at [phone] between the hours of 8:00 a.m. and 4:30 p.m.  Voicemails left after 4:00 p.m. will not be returned until the following business day.  For prescription refill requests, have your pharmacy contact our office and allow 72 hours.    Cancer Center Support Programs:   > Cancer Support Group  2nd Tuesday of the month 1pm-2pm, Journey Room

## 2020-05-10 NOTE — Progress Notes (Signed)
Patient has been assessed, vital signs and labs have been reviewed by Dr. Katragadda. ANC, Creatinine, LFTs, and Platelets are within treatment parameters per Dr. Katragadda. The patient is good to proceed with treatment at this time.  

## 2020-05-10 NOTE — Progress Notes (Signed)
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25 mg (04/12/2020), 0.25 mg (04/26/2020) leucovorin 868 mg in dextrose 5 % 250 mL infusion, 400 mg/m2 = 868 mg, Intravenous,  Once, 9 of 12 cycles Administration: 868 mg (12/31/2019), 868 mg (01/14/2020), 868 mg (01/28/2020), 868 mg (02/11/2020), 868 mg (02/25/2020), 868 mg (03/10/2020), 868 mg (03/24/2020), 868 mg (04/12/2020), 868 mg (04/26/2020) oxaliplatin (ELOXATIN) 150 mg in dextrose 5 % 500 mL chemo infusion, 68 mg/m2 = 150 mg (80 % of original dose 85 mg/m2), Intravenous,  Once, 9 of 12 cycles Dose modification: 68 mg/m2 (80 % of original dose 85 mg/m2, Cycle 1, Reason: Other (see comments), Comment: diabetic neuropathy) Administration: 150 mg (12/31/2019), 150 mg (01/14/2020), 150 mg (01/28/2020), 150 mg (02/11/2020), 150 mg (02/25/2020), 150 mg (03/10/2020), 150 mg (03/24/2020), 150 mg (04/12/2020), 150 mg (04/26/2020) fluorouracil (ADRUCIL) chemo injection 850 mg, 400 mg/m2 = 850 mg,  Intravenous,  Once, 9 of 12 cycles Administration: 850 mg (12/31/2019), 850 mg (01/14/2020), 850 mg (01/28/2020), 850 mg (02/11/2020), 850 mg (02/25/2020), 850 mg (03/10/2020), 850 mg (03/24/2020), 850 mg (04/12/2020), 850 mg (04/26/2020) fluorouracil (ADRUCIL) 5,000 mg in sodium chloride 0.9 % 150 mL chemo infusion, 2,310 mg/m2 = 5,200 mg, Intravenous, 1 Day/Dose, 9 of 12 cycles Administration: 5,000 mg (12/31/2019), 5,000 mg (01/14/2020), 5,000 mg (01/28/2020), 5,000 mg (02/11/2020), 5,000 mg (02/25/2020), 5,000 mg (03/10/2020), 5,000 mg (03/24/2020), 5,000 mg (04/12/2020), 5,000 mg (04/26/2020) nivolumab (OPDIVO) 240 mg in sodium chloride 0.9 % 100 mL chemo infusion, 240 mg (100 % of original dose 240 mg), Intravenous, Once, 9 of 12 cycles Dose modification: 240 mg (original dose 240 mg, Cycle 1, Reason: Provider Judgment) Administration: 240 mg (12/31/2019), 240 mg (01/14/2020), 240 mg (01/28/2020), 240 mg (02/11/2020), 240 mg (02/25/2020), 240 mg (03/10/2020), 240 mg (03/24/2020), 240 mg (04/12/2020), 240 mg (04/26/2020)  for chemotherapy treatment.      CANCER STAGING: Cancer Staging Adenocarcinoma of gastroesophageal junction [facility]) Staging form: Esophagus - Adenocarcinoma, AJCC 8th Edition - Clinical stage from 12/29/2019: Stage IVB (cTX, cN3, cM1) - Signed by Derek Jack, MD on 12/29/2019   INTERVAL HISTORY:  [name], a 69 y.o. male, returns for routine follow-up and consideration for next cycle of chemotherapy. Amandeep was last seen on 04/26/2020.  Due for cycle #10 of FOLFOX & Opdivo today.   Overall, he tells me he has been feeling pretty well. He started taking losartan 1/4 pill for his hypertension of 150s/110s after taking off his pump. He continues taking metoprolol 1/2 pill BID. He denies feeling light-headed. He still has numbness  and burning in his bilat hands.  Overall, he feels ready for next cycle of chemo today.    REVIEW OF SYSTEMS:  Review of Systems  Constitutional: Positive  for fatigue (mild). Negative for appetite change.  HENT:   Positive for trouble swallowing.   Skin: Negative for rash.  Neurological: Positive for numbness (+ burning in hands). Negative for light-headedness.  All other systems reviewed and are negative.   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Timothy Oconnor)    Anxiety    Diabetes ([place])    Heart attack ([place])    Heart disease    High cholesterol    Hyperplasia of prostate    Hypertension    Low back pain    Port-A-Cath in place 12/30/2019   Tremor    Past Surgical History:  Procedure Laterality Date   arterectomy     1993   BIOPSY  12/01/2019   Procedure: BIOPSY;  Surgeon: Rogene Houston, MD;  Location: [facility] ENDO SU[facility];  Service: Endoscopy;;  esophagus   ESOPHAGOGASTRODUODENOSCOPY (EGD) WITH PROPOFOL N/A 12/01/2019   Procedure: ESOPHAGOGASTRODUODENOSCOPY (EGD) WITH PROPOFOL;  Surgeon: Rogene Houston, MD;  Location: [facility] ENDO SUITE;  Service: Endoscopy;  Laterality: N/A;   ESOPHAGOGASTRODUODENOSCOPY (EGD) WITH PROPOFOL N/A 12/24/2019   Procedure: ESOPHAGOGASTRODUODENOSCOPY (EGD) WITH PROPOFOL;  Surgeon: Aviva Signs, MD;  Location: [facility] ORS;  Service: General;  Laterality: N/A;   Heart stents     2002, 2004, 2006   PEG PLACEMENT N/A 12/24/2019   Procedure: PERCUTANEOUS ENDOSCOPIC GASTROSTOMY (PEG) PLACEMENT;  Surgeon: Aviva Signs, MD;  Location: [facility] ORS;  Service: General;  Laterality: N/A;   PORTACATH PLACEMENT Left 12/24/2019   Procedure: INSERTION PORT-A-CATH;  Surgeon: Aviva Signs, MD;  Location: [facility] ORS;  Service: General;  Laterality: Left;    SOCIAL HISTORY:  Social History   Socioeconomic History   Marital status: Married    Spouse name: Not on file   Number of children: 2   Years of education: 16   Highest education level: Not on file  Occupational History   Occupation: [employer]    Comment: Delivers medication  Tobacco  Use   Smoking status: Former Smoker    Packs/day: 1.00    Years: 50.00    Pack years: 50.00    Quit date: 12/21/2017    Years since quitting: 2.3   Smokeless tobacco: Never Used   Tobacco comment: Quit 2017  Substance and Sexual Activity   Alcohol use: Yes    Alcohol/week: 0.0 standard drinks    Comment: 3 drinks per week   Drug use: No   Sexual activity: Yes  Other Topics Concern   Not on file  Social History Narrative   Lives at home with his wife.   Right-handed.   2 diet cokes per day.   Social Determinants of Health   Financial Resource Strain: Low Risk    Difficulty of Paying Living Expenses: Not hard at all  Food Insecurity: No Food Insecurity   Worried About Charity fundraiser in the Last Year: Never true   [place] in the Last Year: Never true  Transportation Needs: No Transportation Needs   Lack of Transportation (Medical): No   Lack of Transportation (Non-Medical): No  Physical Activity: Insufficiently Active   Days of Exercise per Week: 2 days   Minutes of Exercise per Session: 30 min  Stress: No Stress Concern Present   Feeling of Stress :  Only a little  Social Connections: Not Isolated   Frequency of Communication with Friends and Family: More than three times a week   Frequency of Social Gatherings with Friends and Family: More than three times a week   Attends Religious Services: 1 to 4 times per year   Active Member of [employer] or Organizations: No   Attends Music therapist: 1 to 4 times per year   Marital Status: Married  Human resources officer Violence: Not At Risk   Fear of Current or Ex-Partner: No   Emotionally Abused: No   Physically Abused: No   Sexually Abused: No    FAMILY HISTORY:  Family History  Problem Relation Age of Onset   Alzheimer's disease Mother    Diabetes Father    Heart disease Father    Stroke Father    Heart disease Brother    Colon cancer Brother    Alzheimer's disease  Sister     CURRENT MEDICATIONS:  Current Outpatient Medications  Medication Sig Dispense Refill   ascorbic acid (VITAMIN C) 500 MG tablet Take 500 mg by mouth 2 (two) times a week.      aspirin EC 81 MG tablet Take 81 mg by mouth daily.      atorvastatin (LIPITOR) 80 MG tablet Take 80 mg by mouth every evening.      calcium-vitamin D (OSCAL WITH D) 500-200 MG-UNIT tablet Take 2 tablets by mouth 2 (two) times daily.     co-enzyme Q-10 30 MG capsule Take 30 mg by mouth daily.     ezetimibe (ZETIA) 10 MG tablet Take 10 mg by mouth daily.      FLUOROURACIL IV Inject into the vein every 14 (fourteen) days.     folic acid (FOLVITE) 938 MCG tablet Take 800 mcg by mouth daily.      isosorbide mononitrate (IMDUR) 60 MG 24 hr tablet Take 60 mg by mouth daily.     LEUCOVORIN CALCIUM IV Inject into the vein every 14 (fourteen) days.     losartan (COZAAR) 50 MG tablet Take 25 mg by mouth daily.     metFORMIN (GLUCOPHAGE-XR) 500 MG 24 hr tablet Take 2,000 mg by mouth every morning.     metFORMIN (GLUMETZA) 1000 MG (MOD) 24 hr tablet Take 1,000 mg by mouth daily with breakfast.     metoprolol tartrate (LOPRESSOR) 25 MG tablet Take 0.5 tablets (12.5 mg total) by mouth 2 (two) times daily. (Patient taking differently: Take 25 mg by mouth 2 (two) times daily. ) 60 tablet 1   Multiple Vitamin (MULTI-VITAMINS) TABS Take 1 tablet by mouth daily.      NIVOLUMAB IV Inject into the vein every 14 (fourteen) days.     OXALIPLATIN IV Inject into the vein every 14 (fourteen) days.     tamsulosin (FLOMAX) 0.4 MG CAPS capsule Take 0.4 mg by mouth daily.      ALPRAZolam (XANAX) 0.5 MG tablet Take 1 tablet (0.5 mg total) by mouth 2 (two) times daily as needed for anxiety. (Patient not taking: Reported on 05/10/2020) 30 tablet 0   HYDROcodone-acetaminophen (HYCET) 7.5-325 mg/15 ml solution Take 15 mLs by mouth every 4 (four) hours as needed for moderate pain. (Patient not taking: Reported on 05/10/2020) 120  mL 0   lidocaine-prilocaine (EMLA) cream Apply a dime-sized amount to port a cath site and cover with plastic wrap 1 hour prior to chemotherapy appointments (Patient not taking: Reported on 05/10/2020) 30 g 3   prochlorperazine (COMPAZINE) 10 MG  tablet Take 1 tablet (10 mg total) by mouth every 6 (six) hours as needed (Nausea or vomiting). (Patient not taking: Reported on 05/10/2020) 30 tablet 1   No current facility-administered medications for this visit.    ALLERGIES:  No Known Allergies  PHYSICAL EXAM:  Performance status (ECOG): 1 - Symptomatic but completely ambulatory  There were no vitals filed for this visit. Wt Readings from Last 3 Encounters:  04/26/20 212 lb 8 oz (96.4 kg)  04/12/20 213 lb 14.4 oz (97 kg)  03/24/20 218 lb 6.4 oz (99.1 kg)   Physical Exam Vitals reviewed.  Constitutional:      Appearance: Normal appearance. He is obese.  Cardiovascular:     Rate and Rhythm: Normal rate and regular rhythm.     Pulses: Normal pulses.     Heart sounds: Normal heart sounds.  Pulmonary:     Effort: Pulmonary effort is normal.     Breath sounds: Normal breath sounds.  Abdominal:     Palpations: Abdomen is soft. There is no mass.     Tenderness: There is no abdominal tenderness.  Musculoskeletal:     Right lower leg: No edema.     Left lower leg: No edema.  Skin:    Findings: No rash.  Neurological:     General: No focal deficit present.     Mental Status: He is alert and oriented to person, place, and time.  Psychiatric:        Mood and Affect: Mood normal.        Behavior: Behavior normal.     LABORATORY DATA:  I have reviewed the labs as listed.  CBC Latest Ref Rng & Units 05/10/2020 04/26/2020 04/12/2020  WBC 4.0 - 10.5 K/uL 3.7(L) 3.5(L) 3.8(L)  Hemoglobin 13.0 - 17.0 g/dL 11.2(L) 11.4(L) 12.1(L)  Hematocrit 39.0 - 52.0 % 34.5(L) 34.7(L) 37.2(L)  Platelets 150 - 400 K/uL 140(L) 147(L) 272   CMP Latest Ref Rng & Units 04/26/2020 04/12/2020 03/24/2020  Glucose 70  - 99 mg/dL 252(H) 145(H) 147(H)  BUN 8 - 23 mg/dL _0 Creatinine 0.61 - 1.24 mg/dL 0.88 0.66 0.69  Sodium 135 - 145 mmol/L 137 141 140  Potassium 3.5 - 5.1 mmol/L 3.6 4.0 4.0  Chloride 98 - 111 mmol/L 104 107 107  CO2 22 - 32 mmol/L _1 Calcium 8.9 - 10.3 mg/dL 8.5(L) 9.0 8.8(L)  Total Protein 6.5 - 8.1 g/dL 6.3(L) 6.5 6.4(L)  Total Bilirubin 0.3 - 1.2 mg/dL 0.7 0.8 0.9  Alkaline Phos 38 - 126 U/L 41 43 42  AST 15 - 41 U/L _2 ALT 0 - 44 U/L _3 DIAGNOSTIC IMAGING:  I have independently reviewed the scans and discussed with the patient.   ASSESSMENT:  1.  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  HER-2 FISH to be followed up.  PD-L1 CPS-5%. -FOLFOX and Opdivo started on 12/31/2019. -PET scan on 03/22/2020 showed marked reduction in size and metabolic activity in the distal esophageal lesion, marked reduction in size of mediastinal adenopathy.  Near complete resolution with only 2 small mildly metabolic mediastinal lymph nodes remaining.  Resolution of lymph nodes in the upper abdomen.  Right hip bone lesion is still present but better.   PLAN:  1.  Stage IV GE junction adenocarcinoma: -CEA has normalized on 04/12/2020. -I reviewed his labs which are grossly within normal limits. -He will proceed with next cycle.  He will come back in 2 weeks for his next treatment.  I will see him back in 4 weeks for follow-up with PET scan.  2.  Nutrition: -He has not been using feeding tube for the last 3 months. -He is able to eat all sorts of foods and maintaining his weight. -We will talk about discontinuation based on the results of the PET scan.  3.  Bone mets: -Zoledronic acid started on 03/26/2020 and he is tolerating well.  4.  Diabetes: -Continue Metformin 1000 mg twice daily.  5.  Anxiety: -Continue Xanax 0.5 mg as needed.  6.  Hypertension: -Continue Lopressor 25 mg twice daily. -He was told to take 1/4 tablet of  losartan daily as his systolic blood pressure is 95/58.   Orders placed this encounter:  No orders of the defined types were placed in this encounter.    Derek Jack, MD [place] [phone]   I, Milinda Antis, am acting as a scribe for Dr. Sanda Linger.  I, Derek Jack MD, have reviewed the above documentation for accuracy and completeness, and I agree with the above.

## 2020-05-11 LAB — CEA: CEA: 2.7 ng/mL (ref 0.0–4.7)

## 2020-05-12 ENCOUNTER — Inpatient Hospital Stay (HOSPITAL_COMMUNITY): Payer: Medicare HMO

## 2020-05-12 ENCOUNTER — Encounter (HOSPITAL_COMMUNITY): Payer: Self-pay

## 2020-05-12 VITALS — BP 107/68 | HR 62 | Temp 98.8°F | Resp 18

## 2020-05-12 DIAGNOSIS — Z95828 Presence of other vascular implants and grafts: Secondary | ICD-10-CM

## 2020-05-12 DIAGNOSIS — C160 Malignant neoplasm of cardia: Secondary | ICD-10-CM

## 2020-05-12 DIAGNOSIS — Z5111 Encounter for antineoplastic chemotherapy: Secondary | ICD-10-CM | POA: Diagnosis not present

## 2020-05-12 MED ORDER — HEPARIN SOD (PORK) LOCK FLUSH 100 UNIT/ML IV SOLN
500.0000 [IU] | Freq: Once | INTRAVENOUS | Status: AC | PRN
  Administered 2020-05-12: 500 [IU]

## 2020-05-12 MED ORDER — SODIUM CHLORIDE 0.9% FLUSH
10.0000 mL | INTRAVENOUS | Status: DC | PRN
  Administered 2020-05-12: 10 mL

## 2020-05-12 NOTE — Patient Instructions (Signed)
[place] at [facility] Discharge Instructions  5FU pump discontinued with portacath flushed per protocol today. Follow-up as scheduled   Thank you for choosing [place] at [facility] to provide your oncology and hematology care.  To afford each patient quality time with our provider, please arrive at least 15 minutes before your scheduled appointment time.   If you have a lab appointment with the [place] please come in thru the Main Entrance and check in at the main information desk.  You need to re-schedule your appointment should you arrive 10 or more minutes late.  We strive to give you quality time with our providers, and arriving late affects you and other patients whose appointments are after yours.  Also, if you no show three or more times for appointments you may be dismissed from the clinic at the providers discretion.     Again, thank you for choosing [facility].  Our hope is that these requests will decrease the amount of time that you wait before being seen by our physicians.       _____________________________________________________________  Should you have questions after your visit to [facility], please contact our office at [phone] between the hours of 8:00 a.m. and 4:30 p.m.  Voicemails left after 4:00 p.m. will not be returned until the following business day.  For prescription refill requests, have your pharmacy contact our office and allow 72 hours.    Due to Covid, you will need to wear a mask upon entering the hospital. If you do not have a mask, a mask will be given to you at the Main Entrance upon arrival. For doctor visits, patients may have 1 support person with them. For treatment visits, patients can not have anyone with them due to social distancing guidelines and our immunocompromised population.

## 2020-05-12 NOTE — Progress Notes (Signed)
Lynnae Prude tolerated 5FU pump well without complaints or incident. 5FU pump discontinued with portacath flushed easily per protocol then de-accessed. VSS Pt did experience some N+V and a severe H/A over his left eye and upper cheek bone at home after his chemo tx on Monday. He took medication for both which was effective but had never had this happen before. He denies any N+V or pain today. I will let Dr. Delton Coombes know this information and pt instructed to call us if this severe pain happens again or go to the ER. Pt verbalized understanding. Pt discharged self ambulatory in satisfactory condition

## 2020-05-12 NOTE — Progress Notes (Signed)
Late entry: 1300 05/10/20  Patient called clinic reporting that he was having severe facial pain since getting his chemo. He reports that he hasn't gotten sick with any of his treatments but today after he got home he vomited and hasn't felt well.  He states that he just doesn't feel well.  I have advised him that this could be a reaction to his treatment and he should go to the ER.  He states that he is going to try and ride it out at home, "I will take some tylenol, benadryl and just sleep this off".  I still encouraged him to seek treatment if anything changes.  He verbalizes understanding.

## 2020-05-14 ENCOUNTER — Ambulatory Visit (HOSPITAL_COMMUNITY): Payer: Medicare HMO

## 2020-05-14 NOTE — Progress Notes (Signed)
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  Patient receiving chemotherapy.   Spoke with patient via phone.  Noted patient with nausea after most recent treatment, severe facial pain.  Patient reports this has resolved and appetite is good.  Tolerating all consistencies of foods. Takes his time to eat.  Recently went to [facility] with his wife and enjoyed some good food.   Patient not using PEG tube but flushing daily.   Medications: reviewed  Labs: reviewed  Anthropometrics:   Weight 212 lb on 6/7  4/21 218 lb 3/24 217 lb 2/24 212 lb   NUTRITION DIAGNOSIS:  Inadequate oral intake improved   INTERVENTION:  Patient to continue to eat solid foods as able including good sources of protein.  Flush tube with at least 93ml of water daily to keep tube patent. Patient has contact information    MONITORING, EVALUATION, GOAL: weight trends, intake   NEXT VISIT: July 2, phone f/u  Brindy Higginbotham B. Zenia Resides, [place], [place] Registered Dietitian [phone] (pager)

## 2020-05-24 ENCOUNTER — Inpatient Hospital Stay (HOSPITAL_COMMUNITY): Payer: Medicare HMO

## 2020-05-24 ENCOUNTER — Encounter (HOSPITAL_COMMUNITY): Payer: Self-pay

## 2020-05-24 ENCOUNTER — Other Ambulatory Visit: Payer: Self-pay

## 2020-05-24 VITALS — BP 127/70 | HR 54 | Temp 97.5°F | Resp 18 | Wt 209.2 lb

## 2020-05-24 DIAGNOSIS — Z5111 Encounter for antineoplastic chemotherapy: Secondary | ICD-10-CM | POA: Diagnosis not present

## 2020-05-24 DIAGNOSIS — C160 Malignant neoplasm of cardia: Secondary | ICD-10-CM

## 2020-05-24 DIAGNOSIS — C7951 Secondary malignant neoplasm of bone: Secondary | ICD-10-CM

## 2020-05-24 DIAGNOSIS — Z95828 Presence of other vascular implants and grafts: Secondary | ICD-10-CM

## 2020-05-24 LAB — CBC WITH DIFFERENTIAL/PLATELET
Abs Immature Granulocytes: 0.01 10*3/uL (ref 0.00–0.07)
Basophils Absolute: 0 10*3/uL (ref 0.0–0.1)
Basophils Relative: 1 %
Eosinophils Absolute: 0.1 10*3/uL (ref 0.0–0.5)
Eosinophils Relative: 2 %
HCT: 33.4 % — ABNORMAL LOW (ref 39.0–52.0)
Hemoglobin: 11.1 g/dL — ABNORMAL LOW (ref 13.0–17.0)
Immature Granulocytes: 0 %
Lymphocytes Relative: 26 %
Lymphs Abs: 1 10*3/uL (ref 0.7–4.0)
MCH: 33.9 pg (ref 26.0–34.0)
MCHC: 33.2 g/dL (ref 30.0–36.0)
MCV: 102.1 fL — ABNORMAL HIGH (ref 80.0–100.0)
Monocytes Absolute: 0.5 10*3/uL (ref 0.1–1.0)
Monocytes Relative: 11 %
Neutro Abs: 2.4 10*3/uL (ref 1.7–7.7)
Neutrophils Relative %: 60 %
Platelets: 133 10*3/uL — ABNORMAL LOW (ref 150–400)
RBC: 3.27 MIL/uL — ABNORMAL LOW (ref 4.22–5.81)
RDW: 14.7 % (ref 11.5–15.5)
WBC: 4 10*3/uL (ref 4.0–10.5)
nRBC: 0 % (ref 0.0–0.2)

## 2020-05-24 LAB — COMPREHENSIVE METABOLIC PANEL
ALT: 25 U/L (ref 0–44)
AST: 21 U/L (ref 15–41)
Albumin: 3.4 g/dL — ABNORMAL LOW (ref 3.5–5.0)
Alkaline Phosphatase: 44 U/L (ref 38–126)
Anion gap: 9 (ref 5–15)
BUN: 12 mg/dL (ref 8–23)
CO2: 24 mmol/L (ref 22–32)
Calcium: 8.4 mg/dL — ABNORMAL LOW (ref 8.9–10.3)
Chloride: 103 mmol/L (ref 98–111)
Creatinine, Ser: 0.81 mg/dL (ref 0.61–1.24)
GFR calc Af Amer: >60 mL/min (ref >60)
GFR calc non Af Amer: >60 mL/min (ref >60)
Glucose, Bld: 266 mg/dL — ABNORMAL HIGH (ref 70–99)
Potassium: 3.5 mmol/L (ref 3.5–5.1)
Sodium: 136 mmol/L (ref 135–145)
Total Bilirubin: 0.4 mg/dL (ref 0.3–1.2)
Total Protein: 6.1 g/dL — ABNORMAL LOW (ref 6.5–8.1)

## 2020-05-24 LAB — MAGNESIUM: Magnesium: 1.9 mg/dL (ref 1.7–2.4)

## 2020-05-24 MED ORDER — SODIUM CHLORIDE 0.9 % IV SOLN: Freq: Once | INTRAVENOUS | Status: AC

## 2020-05-24 MED ORDER — PALONOSETRON HCL INJECTION 0.25 MG/5ML
0.2500 mg | Freq: Once | INTRAVENOUS | Status: AC
  Administered 2020-05-24: 0.25 mg via INTRAVENOUS
  Filled 2020-05-24: qty 5

## 2020-05-24 MED ORDER — ACETAMINOPHEN 325 MG PO TABS
650.0000 mg | ORAL_TABLET | Freq: Once | ORAL | Status: AC
  Administered 2020-05-24: 650 mg via ORAL
  Filled 2020-05-24: qty 2

## 2020-05-24 MED ORDER — SODIUM CHLORIDE 0.9 % IV SOLN
2310.0000 mg/m2 | INTRAVENOUS | Status: DC
  Administered 2020-05-24: 5000 mg via INTRAVENOUS
  Filled 2020-05-24: qty 100

## 2020-05-24 MED ORDER — SODIUM CHLORIDE 0.9% FLUSH
10.0000 mL | INTRAVENOUS | Status: DC | PRN
  Administered 2020-05-24: 10 mL

## 2020-05-24 MED ORDER — OXALIPLATIN CHEMO INJECTION 100 MG/20ML
68.0000 mg/m2 | Freq: Once | INTRAVENOUS | Status: AC
  Administered 2020-05-24: 150 mg via INTRAVENOUS
  Filled 2020-05-24: qty 30

## 2020-05-24 MED ORDER — SODIUM CHLORIDE 0.9 % IV SOLN
10.0000 mg | Freq: Once | INTRAVENOUS | Status: AC
  Administered 2020-05-24: 10 mg via INTRAVENOUS
  Filled 2020-05-24: qty 10

## 2020-05-24 MED ORDER — SODIUM CHLORIDE 0.9 % IV SOLN
240.0000 mg | Freq: Once | INTRAVENOUS | Status: AC
  Administered 2020-05-24: 240 mg via INTRAVENOUS
  Filled 2020-05-24: qty 24

## 2020-05-24 MED ORDER — DEXTROSE 5 % IV SOLN: Freq: Once | INTRAVENOUS | Status: AC

## 2020-05-24 MED ORDER — HYDROCODONE-ACETAMINOPHEN 5-325 MG PO TABS
1.0000 | ORAL_TABLET | Freq: Once | ORAL | Status: AC
  Administered 2020-05-24: 1 via ORAL
  Filled 2020-05-24: qty 1

## 2020-05-24 MED ORDER — ZOLEDRONIC ACID 4 MG/100ML IV SOLN
4.0000 mg | Freq: Once | INTRAVENOUS | Status: AC
  Administered 2020-05-24: 4 mg via INTRAVENOUS
  Filled 2020-05-24: qty 100

## 2020-05-24 MED ORDER — SODIUM CHLORIDE 0.9 % IV SOLN: Freq: Once | INTRAVENOUS | Status: DC

## 2020-05-24 MED ORDER — FLUOROURACIL CHEMO INJECTION 2.5 GM/50ML
400.0000 mg/m2 | Freq: Once | INTRAVENOUS | Status: AC
  Administered 2020-05-24: 850 mg via INTRAVENOUS
  Filled 2020-05-24: qty 17

## 2020-05-24 MED ORDER — LEUCOVORIN CALCIUM INJECTION 350 MG
400.0000 mg/m2 | Freq: Once | INTRAVENOUS | Status: AC
  Administered 2020-05-24: 868 mg via INTRAVENOUS
  Filled 2020-05-24: qty 43.4

## 2020-05-24 NOTE — Patient Instructions (Signed)
[facility] Discharge Instructions for Patients Receiving Chemotherapy   Beginning January 23rd 2017 lab work for the [facility] will be done in the  Main lab at [facility] on 1st floor. If you have a lab appointment with the [place] please come in thru the  Main Entrance and check in at the main information desk   Today you received the following chemotherapy agents Opdivo,FOLFOX,Zometa. Follow-up as scheduled  To help prevent nausea and vomiting after your treatment, we encourage you to take your nausea medication   If you develop nausea and vomiting, or diarrhea that is not controlled by your medication, call the clinic.  The clinic phone number is [phone]. Office hours are Monday-Friday 8:30am-5:00pm.  BELOW ARE SYMPTOMS THAT SHOULD BE REPORTED IMMEDIATELY:  *FEVER GREATER THAN 101.0 F  *CHILLS WITH OR WITHOUT FEVER  NAUSEA AND VOMITING THAT IS NOT CONTROLLED WITH YOUR NAUSEA MEDICATION  *UNUSUAL SHORTNESS OF BREATH  *UNUSUAL BRUISING OR BLEEDING  TENDERNESS IN MOUTH AND THROAT WITH OR WITHOUT PRESENCE OF ULCERS  *URINARY PROBLEMS  *BOWEL PROBLEMS  UNUSUAL RASH Items with * indicate a potential emergency and should be followed up as soon as possible. If you have an emergency after office hours please contact your primary care physician or go to the nearest emergency department.  Please call the clinic during office hours if you have any questions or concerns.   You may also contact the Patient Navigator at [phone] should you have any questions or need assistance in obtaining follow up care.      Resources For Cancer Patients and their Caregivers ? American Cancer Society: Can assist with transportation, wigs, general needs, runs Look Good Feel Better.        819-636-9486 ? Cancer Care: Provides financial assistance, online support groups, medication/co-pay assistance.  1-800-813-HOPE 781 424 3384) ? [place] Assists [place] cancer patients and their families through emotional , educational and financial support.  434-581-1413 ? [place] DSS Where to apply for food stamps, Medicaid and utility assistance. 404-362-1830 ? [facility]: Transportation to medical appointments. 731-397-2441 ? Social Security Administration: May apply for disability if have a Stage IV cancer. 720-576-1267 937-044-9730 ? [employer], Disability and Transit Services: Assists with nutrition, care and transit needs. [phone]

## 2020-05-24 NOTE — Progress Notes (Signed)
0900 Labs reviewed with RLockamy NP and pt approved for FOLFOX, Opdivo and Zometa infusions today per NP with Tylenol 650 mg PO added today as a pre-med per NP order         Timothy Oconnor tolerated chemo tx and Zometa infusion well without complaints or incident. Pt discharged with 5FU pump infusing without issues. Calcium 8.4 today and pt denied any tooth or jaw pain and no recent or future dental appts prior to administering Zometa. VSS upon discharge. Pt discharged self ambulatory in satisfactory condition

## 2020-05-26 ENCOUNTER — Inpatient Hospital Stay (HOSPITAL_COMMUNITY): Payer: Medicare HMO

## 2020-05-26 ENCOUNTER — Other Ambulatory Visit: Payer: Self-pay

## 2020-05-26 VITALS — BP 110/58 | HR 62 | Temp 98.8°F | Resp 18

## 2020-05-26 DIAGNOSIS — Z95828 Presence of other vascular implants and grafts: Secondary | ICD-10-CM

## 2020-05-26 DIAGNOSIS — C160 Malignant neoplasm of cardia: Secondary | ICD-10-CM

## 2020-05-26 DIAGNOSIS — Z5111 Encounter for antineoplastic chemotherapy: Secondary | ICD-10-CM | POA: Diagnosis not present

## 2020-05-26 MED ORDER — HEPARIN SOD (PORK) LOCK FLUSH 100 UNIT/ML IV SOLN
500.0000 [IU] | Freq: Once | INTRAVENOUS | Status: AC | PRN
  Administered 2020-05-26: 500 [IU]

## 2020-05-26 MED ORDER — SODIUM CHLORIDE 0.9% FLUSH
10.0000 mL | INTRAVENOUS | Status: DC | PRN
  Administered 2020-05-26: 10 mL

## 2020-06-04 ENCOUNTER — Ambulatory Visit (HOSPITAL_COMMUNITY): Payer: Medicare HMO

## 2020-06-04 ENCOUNTER — Other Ambulatory Visit (HOSPITAL_COMMUNITY): Payer: Self-pay | Admitting: Nurse Practitioner

## 2020-06-04 NOTE — Progress Notes (Signed)
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  Patient receiving folfox, opdivo and zometa.    Spoke with patient via phone for nutrition follow-up.  Patient reports that his appetite is good and that he is eating what he wants.  Reports that he is eating 3 meals per day (oatmeal, cereal, eggs, vegetables, meats, avoiding breads).    PEG remains in place.  Patient is flushing daily.   Denies any nutritional impact symptoms at this time.   Medications: reviewed  Labs: reviewed  Anthropometrics:   Weight 209 lb decreased from 212 lb on 6/7 4/21 218 lb  3/24 217 lb 2/24 212 lb   NUTRITION DIAGNOSIS: Inadequate oral intake improved   INTERVENTION:  Patient to continue eating well balanced diet including good sources of protein. Continue to flush tube daily with 95ml of water to keep patent.  Patient has RD contact information    MONITORING, EVALUATION, GOAL: weight trends, intake   NEXT VISIT: Aug 13, phone f/u  Lelan Cush B. Zenia Resides, [place], [place] Registered Dietitian [phone] (pager)

## 2020-06-07 ENCOUNTER — Encounter (HOSPITAL_COMMUNITY): Payer: Self-pay

## 2020-06-07 ENCOUNTER — Encounter (HOSPITAL_COMMUNITY): Payer: Medicare HMO

## 2020-06-08 ENCOUNTER — Inpatient Hospital Stay (HOSPITAL_COMMUNITY): Payer: Medicare HMO | Admitting: Hematology

## 2020-06-08 ENCOUNTER — Other Ambulatory Visit: Payer: Self-pay

## 2020-06-08 ENCOUNTER — Other Ambulatory Visit (HOSPITAL_COMMUNITY): Payer: Self-pay | Admitting: Nurse Practitioner

## 2020-06-08 ENCOUNTER — Other Ambulatory Visit: Payer: Self-pay | Admitting: Emergency Medicine

## 2020-06-08 ENCOUNTER — Inpatient Hospital Stay (HOSPITAL_COMMUNITY): Payer: Medicare HMO | Attending: Hematology

## 2020-06-08 ENCOUNTER — Inpatient Hospital Stay (HOSPITAL_COMMUNITY): Payer: Medicare HMO

## 2020-06-08 VITALS — BP 120/68 | HR 52 | Temp 97.8°F | Resp 18 | Wt 210.2 lb

## 2020-06-08 DIAGNOSIS — C160 Malignant neoplasm of cardia: Secondary | ICD-10-CM | POA: Insufficient documentation

## 2020-06-08 DIAGNOSIS — Z800 Family history of malignant neoplasm of digestive organs: Secondary | ICD-10-CM | POA: Insufficient documentation

## 2020-06-08 DIAGNOSIS — Z5111 Encounter for antineoplastic chemotherapy: Secondary | ICD-10-CM | POA: Diagnosis present

## 2020-06-08 DIAGNOSIS — E119 Type 2 diabetes mellitus without complications: Secondary | ICD-10-CM | POA: Diagnosis not present

## 2020-06-08 DIAGNOSIS — Z87891 Personal history of nicotine dependence: Secondary | ICD-10-CM | POA: Diagnosis not present

## 2020-06-08 DIAGNOSIS — I252 Old myocardial infarction: Secondary | ICD-10-CM | POA: Insufficient documentation

## 2020-06-08 DIAGNOSIS — Z931 Gastrostomy status: Secondary | ICD-10-CM | POA: Insufficient documentation

## 2020-06-08 DIAGNOSIS — I10 Essential (primary) hypertension: Secondary | ICD-10-CM | POA: Insufficient documentation

## 2020-06-08 DIAGNOSIS — R196 Halitosis: Secondary | ICD-10-CM | POA: Diagnosis not present

## 2020-06-08 DIAGNOSIS — C7951 Secondary malignant neoplasm of bone: Secondary | ICD-10-CM | POA: Diagnosis not present

## 2020-06-08 DIAGNOSIS — Z79899 Other long term (current) drug therapy: Secondary | ICD-10-CM | POA: Diagnosis not present

## 2020-06-08 DIAGNOSIS — R05 Cough: Secondary | ICD-10-CM | POA: Insufficient documentation

## 2020-06-08 DIAGNOSIS — F419 Anxiety disorder, unspecified: Secondary | ICD-10-CM | POA: Diagnosis not present

## 2020-06-08 DIAGNOSIS — Z5112 Encounter for antineoplastic immunotherapy: Secondary | ICD-10-CM | POA: Insufficient documentation

## 2020-06-08 DIAGNOSIS — R200 Anesthesia of skin: Secondary | ICD-10-CM | POA: Diagnosis not present

## 2020-06-08 DIAGNOSIS — R5383 Other fatigue: Secondary | ICD-10-CM | POA: Diagnosis not present

## 2020-06-08 DIAGNOSIS — Z7984 Long term (current) use of oral hypoglycemic drugs: Secondary | ICD-10-CM | POA: Diagnosis not present

## 2020-06-08 DIAGNOSIS — R490 Dysphonia: Secondary | ICD-10-CM | POA: Diagnosis not present

## 2020-06-08 DIAGNOSIS — Z95828 Presence of other vascular implants and grafts: Secondary | ICD-10-CM

## 2020-06-08 LAB — CMP (CANCER CENTER ONLY)
ALT: 23 U/L (ref 0–44)
AST: 23 U/L (ref 15–41)
Albumin: 3.5 g/dL (ref 3.5–5.0)
Alkaline Phosphatase: 45 U/L (ref 38–126)
Anion gap: 8 (ref 5–15)
BUN: 8 mg/dL (ref 8–23)
CO2: 26 mmol/L (ref 22–32)
Calcium: 9 mg/dL (ref 8.9–10.3)
Chloride: 106 mmol/L (ref 98–111)
Creatinine: 0.68 mg/dL (ref 0.61–1.24)
GFR, Est AFR Am: >60 mL/min (ref >60)
GFR, Estimated: >60 mL/min (ref >60)
Glucose, Bld: 161 mg/dL — ABNORMAL HIGH (ref 70–99)
Potassium: 3.7 mmol/L (ref 3.5–5.1)
Sodium: 140 mmol/L (ref 135–145)
Total Bilirubin: 0.5 mg/dL (ref 0.3–1.2)
Total Protein: 6.4 g/dL — ABNORMAL LOW (ref 6.5–8.1)

## 2020-06-08 LAB — CBC WITH DIFFERENTIAL/PLATELET
Abs Immature Granulocytes: 0.01 10*3/uL (ref 0.00–0.07)
Basophils Absolute: 0 10*3/uL (ref 0.0–0.1)
Basophils Relative: 0 %
Eosinophils Absolute: 0.1 10*3/uL (ref 0.0–0.5)
Eosinophils Relative: 2 %
HCT: 33.1 % — ABNORMAL LOW (ref 39.0–52.0)
Hemoglobin: 11.1 g/dL — ABNORMAL LOW (ref 13.0–17.0)
Immature Granulocytes: 0 %
Lymphocytes Relative: 42 %
Lymphs Abs: 1.2 10*3/uL (ref 0.7–4.0)
MCH: 33.9 pg (ref 26.0–34.0)
MCHC: 33.5 g/dL (ref 30.0–36.0)
MCV: 101.2 fL — ABNORMAL HIGH (ref 80.0–100.0)
Monocytes Absolute: 0.6 10*3/uL (ref 0.1–1.0)
Monocytes Relative: 19 %
Neutro Abs: 1.1 10*3/uL — ABNORMAL LOW (ref 1.7–7.7)
Neutrophils Relative %: 37 %
Platelets: 137 10*3/uL — ABNORMAL LOW (ref 150–400)
RBC: 3.27 MIL/uL — ABNORMAL LOW (ref 4.22–5.81)
RDW: 14.8 % (ref 11.5–15.5)
WBC: 3 10*3/uL — ABNORMAL LOW (ref 4.0–10.5)
nRBC: 0 % (ref 0.0–0.2)

## 2020-06-08 LAB — MAGNESIUM: Magnesium: 2 mg/dL (ref 1.7–2.4)

## 2020-06-08 MED ORDER — SODIUM CHLORIDE 0.9% FLUSH
10.0000 mL | INTRAVENOUS | Status: DC | PRN
  Administered 2020-06-08: 10 mL via INTRAVENOUS

## 2020-06-08 NOTE — Progress Notes (Signed)
[place], [place] [address]   CLINIC:  Medical Oncology/Hematology  PCP:  [name], MD [address] 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[place])   12/29/2019 Cancer Staging   Staging form: Esophagus - Adenocarcinoma, AJCC 8th Edition - Clinical stage from 12/29/2019: Stage IVB (cTX, cN3, cM1) - Signed by Derek Jack, MD on 12/29/2019   12/31/2019 -  Chemotherapy   The patient had palonosetron (ALOXI) injection 0.25 mg, 0.25 mg, Intravenous,  Once, 11 of 12 cycles Administration: 0.25 mg (12/31/2019), 0.25 mg (01/14/2020), 0.25 mg (01/28/2020), 0.25 mg (02/11/2020), 0.25 mg (02/25/2020), 0.25 mg (03/10/2020), 0.25 mg (03/24/2020), 0.25 mg (04/12/2020), 0.25 mg (04/26/2020), 0.25 mg (05/10/2020), 0.25 mg (05/24/2020) leucovorin 868 mg in dextrose 5 % 250 mL infusion, 400 mg/m2 = 868 mg, Intravenous,  Once, 11 of 12 cycles Administration: 868 mg (12/31/2019), 868 mg (01/14/2020), 868 mg (01/28/2020), 868 mg (02/11/2020), 868 mg (02/25/2020), 868 mg (03/10/2020), 868 mg (03/24/2020), 868 mg (04/12/2020), 868 mg (04/26/2020), 868 mg (05/10/2020), 868 mg (05/24/2020) oxaliplatin (ELOXATIN) 150 mg in dextrose 5 % 500 mL chemo infusion, 68 mg/m2 = 150 mg (80 % of original dose 85 mg/m2), Intravenous,  Once, 11 of 12 cycles Dose modification: 68 mg/m2 (80 % of original dose 85 mg/m2, Cycle 1, Reason: Other (see comments), Comment: diabetic neuropathy) Administration: 150 mg (12/31/2019), 150 mg (01/14/2020), 150 mg (01/28/2020), 150 mg (02/11/2020), 150 mg (02/25/2020), 150 mg (03/10/2020), 150 mg (03/24/2020), 150 mg (04/12/2020), 150 mg  (04/26/2020), 150 mg (05/10/2020), 150 mg (05/24/2020) fluorouracil (ADRUCIL) chemo injection 850 mg, 400 mg/m2 = 850 mg, Intravenous,  Once, 11 of 12 cycles Administration: 850 mg (12/31/2019), 850 mg (01/14/2020), 850 mg (01/28/2020), 850 mg (02/11/2020), 850 mg (02/25/2020), 850 mg (03/10/2020), 850 mg (03/24/2020), 850 mg (04/12/2020), 850 mg (04/26/2020), 850 mg (05/10/2020), 850 mg (05/24/2020) fluorouracil (ADRUCIL) 5,000 mg in sodium chloride 0.9 % 150 mL chemo infusion, 2,310 mg/m2 = 5,200 mg, Intravenous, 1 Day/Dose, 11 of 12 cycles Administration: 5,000 mg (12/31/2019), 5,000 mg (01/14/2020), 5,000 mg (01/28/2020), 5,000 mg (02/11/2020), 5,000 mg (02/25/2020), 5,000 mg (03/10/2020), 5,000 mg (03/24/2020), 5,000 mg (04/12/2020), 5,000 mg (04/26/2020), 5,000 mg (05/10/2020), 5,000 mg (05/24/2020) nivolumab (OPDIVO) 240 mg in sodium chloride 0.9 % 100 mL chemo infusion, 240 mg (100 % of original dose 240 mg), Intravenous, Once, 11 of 12 cycles Dose modification: 240 mg (original dose 240 mg, Cycle 1, Reason: Provider Judgment) Administration: 240 mg (12/31/2019), 240 mg (01/14/2020), 240 mg (01/28/2020), 240 mg (02/11/2020), 240 mg (02/25/2020), 240 mg (03/10/2020), 240 mg (03/24/2020), 240 mg (04/12/2020), 240 mg (04/26/2020), 240 mg (05/10/2020), 240 mg (05/24/2020)  for chemotherapy treatment.      CANCER STAGING: Cancer Staging Adenocarcinoma of gastroesophageal junction [facility]) Staging form: Esophagus - Adenocarcinoma, AJCC 8th Edition - Clinical stage from 12/29/2019: Stage IVB (cTX, cN3, cM1) - Signed by Derek Jack, MD on 12/29/2019   INTERVAL HISTORY:  [name], a 69 y.o. male, returns for routine follow-up of his GE junction adenocarcinoma. Denorris was last seen on 05/10/2020.   Due for cycle #12 of FOLFOX and Opdivo today.  Today he reports that immediately after his  prior to last treatment, he was nauseous and vomited up in the parking lot. He also reports numbness in his lips and feet lasting for 1  day after treatment.  He reports that the CT scan was scheduled for yesterday, but he was informed on Friday that it needed prior approval.  Overall, he feels ready for next cycle of chemo today.   REVIEW OF SYSTEMS:  Review of Systems  Constitutional: Positive for fatigue (mild). Negative for appetite change.  HENT:   Positive for trouble swallowing.   Cardiovascular: Negative for leg swelling.  Gastrointestinal: Negative for diarrhea, nausea and vomiting.  Skin: Negative for rash.  Neurological: Positive for numbness (lips and feet).  All other systems reviewed and are negative.   PAST MEDICAL/SURGICAL HISTORY:  Past Medical History:  Diagnosis Date  . 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 Anxiety   . Diabetes ([place])   . Heart attack ([place])   . Heart disease   . High cholesterol   . Hyperplasia of prostate   . Hypertension   . Low back pain   . Port-A-Cath in place 12/30/2019  . Tremor    Past Surgical History:  Procedure Laterality Date  . arterectomy     1993  . BIOPSY  12/01/2019   Procedure: BIOPSY;  Surgeon: Rogene Houston, MD;  Location: [facility] ENDO SUITE;  Service: Endoscopy;;  esophagus  . ESOPHAGOGASTRODUODENOSCOPY (EGD) WITH PROPOFOL N/A 12/01/2019   Procedure: ESOPHAGOGASTRODUODENOSCOPY (EGD) WITH PROPOFOL;  Surgeon: Rogene Houston, MD;  Location: [facility] ENDO SUITE;  Service: Endoscopy;  Laterality: N/A;  . ESOPHAGOGASTRODUODENOSCOPY (EGD) WITH PROPOFOL N/A 12/24/2019   Procedure: ESOPHAGOGASTRODUODENOSCOPY (EGD) WITH PROPOFOL;  Surgeon: Aviva Signs, MD;  Location: [facility] ORS;  Service: General;  Laterality: N/A;  . Heart stents     2002, 2004, 2006  . PEG PLACEMENT N/A 12/24/2019   Procedure: PERCUTANEOUS ENDOSCOPIC GASTROSTOMY (PEG) PLACEMENT;  Surgeon: Aviva Signs, MD;  Location: [facility] ORS;  Service: General;  Laterality: N/A;  . PORTACATH PLACEMENT Left 12/24/2019   Procedure: INSERTION PORT-A-CATH;  Surgeon: Aviva Signs, MD;  Location: [facility] ORS;   Service: General;  Laterality: Left;    SOCIAL HISTORY:  Social History   Socioeconomic History  . Marital status: Married    Spouse name: Not on file  . Number of children: 2  . Years of education: 83  . Highest education level: Not on file  Occupational History  . Occupation: [employer]    Comment: Delivers medication  Tobacco Use  . Smoking status: Former Smoker    Packs/day: 1.00    Years: 50.00    Pack years: 50.00    Quit date: 12/21/2017    Years since quitting: 2.4  . Smokeless tobacco: Never Used  . Tobacco comment: Quit 2017  Vaping Use  . Vaping Use: Never used  Substance and Sexual Activity  . Alcohol use: Yes    Alcohol/week: 0.0 standard drinks    Comment: 3 drinks per week  . Drug use: No  . Sexual activity: Yes  Other Topics Concern  . Not on file  Social History Narrative   Lives at home with his wife.   Right-handed.   2 diet cokes per day.   Social Determinants of Health   Financial Resource Strain: Low Risk   . Difficulty of Paying Living Expenses: Not hard at all  Food Insecurity: No Food Insecurity  . Worried About Charity fundraiser in the Last Year: Never true  . Ran  Out of Food in the Last Year: Never true  Transportation Needs: No Transportation Needs  . Lack of Transportation (Medical): No  . Lack of Transportation (Non-Medical): No  Physical Activity: Insufficiently Active  . Days of Exercise per Week: 2 days  . Minutes of Exercise per Session: 30 min  Stress: No Stress Concern Present  . Feeling of Stress : Only a little  Social Connections: Socially Integrated  . Frequency of Communication with Friends and Family: More than three times a week  . Frequency of Social Gatherings with Friends and Family: More than three times a week  . Attends Religious Services: 1 to 4 times per year  . Active Member of Clubs or Organizations: No  . Attends Archivist Meetings: 1 to 4 times per year  . Marital Status: Married  Arboriculturist Violence: Not At Risk  . Fear of Current or Ex-Partner: No  . Emotionally Abused: No  . Physically Abused: No  . Sexually Abused: No    FAMILY HISTORY:  Family History  Problem Relation Age of Onset  . Alzheimer's disease Mother   . Diabetes Father   . Heart disease Father   . Stroke Father   . Heart disease Brother   . Colon cancer Brother   . Alzheimer's disease Sister     CURRENT MEDICATIONS:  Current Outpatient Medications  Medication Sig Dispense Refill  . ascorbic acid (VITAMIN C) 500 MG tablet Take 500 mg by mouth 2 (two) times a week.     Marland Kitchen aspirin EC 81 MG tablet Take 81 mg by mouth daily.     Marland Kitchen atorvastatin (LIPITOR) 80 MG tablet Take 80 mg by mouth every evening.     . calcium-vitamin D (OSCAL WITH D) 500-200 MG-UNIT tablet Take 2 tablets by mouth 2 (two) times daily.    Marland Kitchen co-enzyme Q-10 30 MG capsule Take 30 mg by mouth daily.    Marland Kitchen ezetimibe (ZETIA) 10 MG tablet Take 10 mg by mouth daily.     Marland Kitchen FLUOROURACIL IV Inject into the vein every 14 (fourteen) days.    . folic acid (FOLVITE) 841 MCG tablet Take 800 mcg by mouth daily.     . isosorbide mononitrate (IMDUR) 60 MG 24 hr tablet Take 60 mg by mouth daily.    Marland Kitchen LEUCOVORIN CALCIUM IV Inject into the vein every 14 (fourteen) days.    Marland Kitchen losartan (COZAAR) 50 MG tablet Take 25 mg by mouth daily.    . metFORMIN (GLUCOPHAGE-XR) 500 MG 24 hr tablet Take 2,000 mg by mouth every morning.    . metFORMIN (GLUMETZA) 1000 MG (MOD) 24 hr tablet Take 1,000 mg by mouth daily with breakfast.    . metoprolol tartrate (LOPRESSOR) 25 MG tablet Take 0.5 tablets (12.5 mg total) by mouth 2 (two) times daily. (Patient taking differently: Take 25 mg by mouth 2 (two) times daily. ) 60 tablet 1  . Multiple Vitamin (MULTI-VITAMINS) TABS Take 1 tablet by mouth daily.     Marland Kitchen NIVOLUMAB IV Inject into the vein every 14 (fourteen) days.    . OXALIPLATIN IV Inject into the vein every 14 (fourteen) days.    . tamsulosin (FLOMAX) 0.4 MG CAPS  capsule Take 0.4 mg by mouth daily.     Marland Kitchen ALPRAZolam (XANAX) 0.5 MG tablet Take 1 tablet (0.5 mg total) by mouth 2 (two) times daily as needed for anxiety. (Patient not taking: Reported on 06/08/2020) 30 tablet 0  . HYDROcodone-acetaminophen (HYCET) 7.5-325 mg/15 ml  solution Take 15 mLs by mouth every 4 (four) hours as needed for moderate pain. (Patient not taking: Reported on 06/08/2020) 120 mL 0  . lidocaine-prilocaine (EMLA) cream Apply a dime-sized amount to port a cath site and cover with plastic wrap 1 hour prior to chemotherapy appointments (Patient not taking: Reported on 06/08/2020) 30 g 3  . prochlorperazine (COMPAZINE) 10 MG tablet Take 1 tablet (10 mg total) by mouth every 6 (six) hours as needed (Nausea or vomiting). (Patient not taking: Reported on 06/08/2020) 30 tablet 1   No current facility-administered medications for this visit.    ALLERGIES:  No Known Allergies  PHYSICAL EXAM:  Performance status (ECOG): 1 - Symptomatic but completely ambulatory  There were no vitals filed for this visit. Wt Readings from Last 3 Encounters:  06/08/20 95.3 kg (210 lb 3.2 oz)  05/24/20 94.9 kg (209 lb 3.2 oz)  05/10/20 96.4 kg (212 lb 9.6 oz)   Physical Exam Vitals reviewed.  Constitutional:      Appearance: Normal appearance. He is obese.  Cardiovascular:     Rate and Rhythm: Normal rate and regular rhythm.     Pulses: Normal pulses.     Heart sounds: Normal heart sounds.  Pulmonary:     Effort: Pulmonary effort is normal.     Breath sounds: Normal breath sounds.  Musculoskeletal:     Right lower leg: No edema.     Left lower leg: No edema.  Neurological:     General: No focal deficit present.     Mental Status: He is alert and oriented to person, place, and time.  Psychiatric:        Mood and Affect: Mood normal.        Behavior: Behavior normal.      LABORATORY DATA:  I have reviewed the labs as listed.  CBC Latest Ref Rng & Units 06/08/2020 05/24/2020 05/10/2020  WBC 4.0 - 10.5  K/uL 3.0(L) 4.0 3.7(L)  Hemoglobin 13.0 - 17.0 g/dL 11.1(L) 11.1(L) 11.2(L)  Hematocrit 39 - 52 % 33.1(L) 33.4(L) 34.5(L)  Platelets 150 - 400 K/uL 137(L) 133(L) 140(L)   CMP Latest Ref Rng & Units 06/08/2020 05/24/2020 05/10/2020  Glucose 70 - 99 mg/dL 161(H) 266(H) 226(H)  BUN 8 - 23 mg/dL _0 Creatinine 0.61 - 1.24 mg/dL 0.68 0.81 0.69  Sodium 135 - 145 mmol/L 140 136 138  Potassium 3.5 - 5.1 mmol/L 3.7 3.5 3.6  Chloride 98 - 111 mmol/L 106 103 104  CO2 22 - 32 mmol/L _1 Calcium 8.9 - 10.3 mg/dL 9.0 8.4(L) 8.8(L)  Total Protein 6.5 - 8.1 g/dL 6.4(L) 6.1(L) 6.3(L)  Total Bilirubin 0.3 - 1.2 mg/dL 0.5 0.4 0.5  Alkaline Phos 38 - 126 U/L 45 44 40  AST 15 - 41 U/L _2 ALT 0 - 44 U/L _3 DIAGNOSTIC IMAGING:  I have independently reviewed the scans and discussed with the patient. No results found.   ASSESSMENT:  1. 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 HER-2 FISH to be followed up. PD-L1 CPS-5%. -FOLFOX and Opdivo started on 12/31/2019. -PET scan on 03/22/2020 showed marked reduction in size and metabolic activity in the distal esophageal lesion, marked reduction in size of mediastinal adenopathy. Near complete resolution with only 2 small mildly metabolic mediastinal lymph nodes remaining. Resolution of lymph nodes in the upper abdomen. Right hip bone lesion is still present but better.  PLAN:  1. Stage IV GE junction adenocarcinoma: -He did not have PET scan done as it was not authorized by his insurance.  They will be authorizing CT scan which will be scheduled. -He tolerated his last cycle very well. -I have reviewed his labs.  LFTs are normal.  White count is 3.0.  However his [place] is low at 1.1.  Hence I would hold his treatment today.  He will be reevaluated in 1 week.  2. Nutrition: -He is eating by mouth and has not used feeding tube in the last 4 months. -We will follow up on the CT scan results and decide  whether we need to discontinue PEG tube.  3. Bone mets: -Continue Zometa monthly.  Calcium is normal.  4. Diabetes: -Continue Metformin 1 g twice daily.  5. Anxiety: -Continue Xanax 0.5 mg as needed.  6. Hypertension: -Continue Lopressor 25 mg.   Orders placed this encounter:  No orders of the defined types were placed in this encounter.    Derek Jack, MD [place] [phone]   I, Milinda Antis, am acting as a scribe for Dr. Sanda Linger.  I, Derek Jack MD, have reviewed the above documentation for accuracy and completeness, and I agree with the above.

## 2020-06-08 NOTE — Progress Notes (Signed)
Patient has been assessed, vital signs and labs have been reviewed by Dr. Delton Coombes. HOLD treatment today, he will return in 1 week with labs and treatment.

## 2020-06-08 NOTE — Patient Instructions (Signed)
[facility] at [facility] Discharge Instructions  You were seen today by Dr. Katragadda. He went over your recent lab results. He will see you back in 1 week for labs, treatment and follow up.   Thank you for choosing [facility] at [facility] to provide your oncology and hematology care.  To afford each patient quality time with our provider, please arrive at least 15 minutes before your scheduled appointment time.   If you have a lab appointment with the Cancer Center please come in thru the  Main Entrance and check in at the main information desk  You need to re-schedule your appointment should you arrive 10 or more minutes late.  We strive to give you quality time with our providers, and arriving late affects you and other patients whose appointments are after yours.  Also, if you no show three or more times for appointments you may be dismissed from the clinic at the providers discretion.     Again, thank you for choosing [facility].  Our hope is that these requests will decrease the amount of time that you wait before being seen by our physicians.       _____________________________________________________________  Should you have questions after your visit to [facility], please contact our office at [phone] between the hours of 8:00 a.m. and 4:30 p.m.  Voicemails left after 4:00 p.m. will not be returned until the following business day.  For prescription refill requests, have your pharmacy contact our office and allow 72 hours.    Cancer Center Support Programs:   > Cancer Support Group  2nd Tuesday of the month 1pm-2pm, Journey Room    

## 2020-06-09 ENCOUNTER — Ambulatory Visit (HOSPITAL_COMMUNITY)
Admission: RE | Admit: 2020-06-09 | Discharge: 2020-06-09 | Disposition: A | Discharge status: Home / Self Care | Payer: Medicare HMO | Source: Ambulatory Visit | Attending: Nurse Practitioner | Admitting: Nurse Practitioner

## 2020-06-09 DIAGNOSIS — C160 Malignant neoplasm of cardia: Secondary | ICD-10-CM | POA: Insufficient documentation

## 2020-06-09 MED ORDER — IOHEXOL 300 MG/ML  SOLN
100.0000 mL | Freq: Once | INTRAMUSCULAR | Status: AC | PRN
  Administered 2020-06-09: 100 mL via INTRAVENOUS

## 2020-06-10 ENCOUNTER — Encounter (HOSPITAL_COMMUNITY): Payer: Medicare HMO

## 2020-06-14 ENCOUNTER — Inpatient Hospital Stay (HOSPITAL_COMMUNITY): Payer: Medicare HMO

## 2020-06-14 ENCOUNTER — Encounter (HOSPITAL_COMMUNITY): Payer: Self-pay | Admitting: Hematology

## 2020-06-14 ENCOUNTER — Other Ambulatory Visit: Payer: Self-pay

## 2020-06-14 ENCOUNTER — Inpatient Hospital Stay (HOSPITAL_COMMUNITY): Payer: Medicare HMO | Admitting: Hematology

## 2020-06-14 VITALS — BP 126/71 | HR 66 | Temp 98.2°F | Resp 18 | Wt 211.0 lb

## 2020-06-14 DIAGNOSIS — C160 Malignant neoplasm of cardia: Secondary | ICD-10-CM

## 2020-06-14 DIAGNOSIS — Z5111 Encounter for antineoplastic chemotherapy: Secondary | ICD-10-CM | POA: Diagnosis not present

## 2020-06-14 LAB — COMPREHENSIVE METABOLIC PANEL
ALT: 18 U/L (ref 0–44)
AST: 20 U/L (ref 15–41)
Albumin: 3.5 g/dL (ref 3.5–5.0)
Alkaline Phosphatase: 42 U/L (ref 38–126)
Anion gap: 8 (ref 5–15)
BUN: 9 mg/dL (ref 8–23)
CO2: 26 mmol/L (ref 22–32)
Calcium: 9.3 mg/dL (ref 8.9–10.3)
Chloride: 104 mmol/L (ref 98–111)
Creatinine, Ser: 0.74 mg/dL (ref 0.61–1.24)
GFR calc Af Amer: >60 mL/min (ref >60)
GFR calc non Af Amer: >60 mL/min (ref >60)
Glucose, Bld: 230 mg/dL — ABNORMAL HIGH (ref 70–99)
Potassium: 3.8 mmol/L (ref 3.5–5.1)
Sodium: 138 mmol/L (ref 135–145)
Total Bilirubin: 0.5 mg/dL (ref 0.3–1.2)
Total Protein: 6.4 g/dL — ABNORMAL LOW (ref 6.5–8.1)

## 2020-06-14 LAB — CBC WITH DIFFERENTIAL/PLATELET
Abs Immature Granulocytes: 0.03 10*3/uL (ref 0.00–0.07)
Basophils Absolute: 0 10*3/uL (ref 0.0–0.1)
Basophils Relative: 1 %
Eosinophils Absolute: 0.1 10*3/uL (ref 0.0–0.5)
Eosinophils Relative: 2 %
HCT: 34.9 % — ABNORMAL LOW (ref 39.0–52.0)
Hemoglobin: 11.3 g/dL — ABNORMAL LOW (ref 13.0–17.0)
Immature Granulocytes: 1 %
Lymphocytes Relative: 41 %
Lymphs Abs: 1.2 10*3/uL (ref 0.7–4.0)
MCH: 32.9 pg (ref 26.0–34.0)
MCHC: 32.4 g/dL (ref 30.0–36.0)
MCV: 101.7 fL — ABNORMAL HIGH (ref 80.0–100.0)
Monocytes Absolute: 0.6 10*3/uL (ref 0.1–1.0)
Monocytes Relative: 22 %
Neutro Abs: 1 10*3/uL — ABNORMAL LOW (ref 1.7–7.7)
Neutrophils Relative %: 33 %
Platelets: 267 10*3/uL (ref 150–400)
RBC: 3.43 MIL/uL — ABNORMAL LOW (ref 4.22–5.81)
RDW: 14.6 % (ref 11.5–15.5)
WBC: 2.9 10*3/uL — ABNORMAL LOW (ref 4.0–10.5)
nRBC: 0 % (ref 0.0–0.2)

## 2020-06-14 LAB — MAGNESIUM: Magnesium: 1.8 mg/dL (ref 1.7–2.4)

## 2020-06-14 LAB — LACTATE DEHYDROGENASE: LDH: 131 U/L (ref 98–192)

## 2020-06-14 MED ORDER — CYANOCOBALAMIN 1000 MCG/ML IJ SOLN
INTRAMUSCULAR | Status: AC
  Filled 2020-06-14: qty 1

## 2020-06-14 MED ORDER — SODIUM CHLORIDE 0.9% FLUSH
10.0000 mL | Freq: Once | INTRAVENOUS | Status: AC
  Administered 2020-06-14: 10 mL via INTRAVENOUS

## 2020-06-14 MED ORDER — HEPARIN SOD (PORK) LOCK FLUSH 100 UNIT/ML IV SOLN
500.0000 [IU] | Freq: Once | INTRAVENOUS | Status: AC
  Administered 2020-06-14: 500 [IU] via INTRAVENOUS

## 2020-06-14 MED ORDER — SODIUM CHLORIDE 0.9 % IV SOLN: INTRAVENOUS | Status: DC

## 2020-06-14 MED ORDER — PEGFILGRASTIM-CBQV 6 MG/0.6ML ~~LOC~~ SOSY
PREFILLED_SYRINGE | SUBCUTANEOUS | Status: AC
  Filled 2020-06-14: qty 0.6

## 2020-06-14 NOTE — Progress Notes (Signed)
No treatment today per MD. Labs and scans reviewed with patient. Will hold treatment for 2 weeks and return to for possible treatment.    Vitals stable and discharged home from clinic ambulatory. Follow up as scheduled.

## 2020-06-14 NOTE — Patient Instructions (Signed)
[place] at [facility] Discharge Instructions  You were seen today by Dr. Delton Coombes. He went over your recent results and scans. You did not receive treatment today because of low white blood cell count. You will be scheduled for another PET scan. Dr. Delton Coombes will see you back in 2 weeks for labs and follow up.   Thank you for choosing [place] at [facility] to provide your oncology and hematology care.  To afford each patient quality time with our provider, please arrive at least 15 minutes before your scheduled appointment time.   If you have a lab appointment with the [facility] please come in thru the Main Entrance and check in at the main information desk  You need to re-schedule your appointment should you arrive 10 or more minutes late.  We strive to give you quality time with our providers, and arriving late affects you and other patients whose appointments are after yours.  Also, if you no show three or more times for appointments you may be dismissed from the clinic at the providers discretion.     Again, thank you for choosing [facility].  Our hope is that these requests will decrease the amount of time that you wait before being seen by our physicians.       _____________________________________________________________  Should you have questions after your visit to [facility], please contact our office at [phone] between the hours of 8:00 a.m. and 4:30 p.m.  Voicemails left after 4:00 p.m. will not be returned until the following business day.  For prescription refill requests, have your pharmacy contact our office and allow 72 hours.    Cancer Center Support Programs:   > Cancer Support Group  2nd Tuesday of the month 1pm-2pm, Journey Room

## 2020-06-14 NOTE — Progress Notes (Signed)
[place], [place] [address]   CLINIC:  Medical Oncology/Hematology  PCP:  [name], MD [address] 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25 mg (04/12/2020), 0.25 mg (04/26/2020), 0.25 mg (05/10/2020), 0.25 mg (05/24/2020) leucovorin 868 mg in dextrose 5 % 250 mL infusion, 400 mg/m2 = 868 mg, Intravenous,  Once, 11 of 12 cycles Administration: 868 mg (12/31/2019), 868 mg (01/14/2020), 868 mg (01/28/2020), 868 mg (02/11/2020), 868 mg (02/25/2020), 868 mg (03/10/2020), 868 mg (03/24/2020), 868 mg (04/12/2020), 868 mg (04/26/2020), 868 mg (05/10/2020), 868 mg (05/24/2020) oxaliplatin (ELOXATIN) 150 mg in dextrose 5 % 500 mL chemo infusion, 68 mg/m2 = 150 mg (80 % of original dose 85 mg/m2), Intravenous,  Once, 11 of 12 cycles Dose modification: 68 mg/m2 (80 % of original dose 85 mg/m2, Cycle 1, Reason: Other (see comments), Comment: diabetic neuropathy) Administration: 150 mg (12/31/2019), 150 mg (01/14/2020), 150 mg (01/28/2020), 150 mg (02/11/2020), 150 mg (02/25/2020), 150 mg (03/10/2020), 150 mg  (03/24/2020), 150 mg (04/12/2020), 150 mg (04/26/2020), 150 mg (05/10/2020), 150 mg (05/24/2020) fluorouracil (ADRUCIL) chemo injection 850 mg, 400 mg/m2 = 850 mg, Intravenous,  Once, 11 of 12 cycles Administration: 850 mg (12/31/2019), 850 mg (01/14/2020), 850 mg (01/28/2020), 850 mg (02/11/2020), 850 mg (02/25/2020), 850 mg (03/10/2020), 850 mg (03/24/2020), 850 mg (04/12/2020), 850 mg (04/26/2020), 850 mg (05/10/2020), 850 mg (05/24/2020) fluorouracil (ADRUCIL) 5,000 mg in sodium chloride 0.9 % 150 mL chemo infusion, 2,310 mg/m2 = 5,200 mg, Intravenous, 1 Day/Dose, 11 of 12 cycles Administration: 5,000 mg (12/31/2019), 5,000 mg (01/14/2020), 5,000 mg (01/28/2020), 5,000 mg (02/11/2020), 5,000 mg (02/25/2020), 5,000 mg (03/10/2020), 5,000 mg (03/24/2020), 5,000 mg (04/12/2020), 5,000 mg (04/26/2020), 5,000 mg (05/10/2020), 5,000 mg (05/24/2020) nivolumab (OPDIVO) 240 mg in sodium chloride 0.9 % 100 mL chemo infusion, 240 mg (100 % of original dose 240 mg), Intravenous, Once, 11 of 12 cycles Dose modification: 240 mg (original dose 240 mg, Cycle 1, Reason: Provider Judgment) Administration: 240 mg (12/31/2019), 240 mg (01/14/2020), 240 mg (01/28/2020), 240 mg (02/11/2020), 240 mg (02/25/2020), 240 mg (03/10/2020), 240 mg (03/24/2020), 240 mg (04/12/2020), 240 mg (04/26/2020), 240 mg (05/10/2020), 240 mg (05/24/2020)  for chemotherapy treatment.      CANCER STAGING: Cancer Staging Adenocarcinoma of gastroesophageal junction [facility]) Staging form: Esophagus - Adenocarcinoma, AJCC 8th Edition - Clinical stage from 12/29/2019: Stage IVB (cTX, cN3, cM1) - Signed by Derek Jack, MD on 12/29/2019   INTERVAL HISTORY:  [name], a 69 y.o. male, returns for routine follow-up and consideration for next cycle of chemotherapy. Caton was last seen on 06/08/2020.  Due for cycle #12 of FOLFOX and Aloxi today.  Today he reports feeling fatigued and changes in his voice. This is the not the first time his voice has gone; similar thing  happened after his last MI, and it usually resolves after 1-2 weeks. His appetite is good, though he has to watch what he eats, though he is not supplementing with Ensure. He denies having any hip pain.  Overall, he feels ready for next cycle of chemo today.    REVIEW OF SYSTEMS:  Review of Systems  Constitutional: Positive for fatigue (severe). Negative for appetite change.  Respiratory: Positive for shortness of breath.   All other systems reviewed and are negative.   PAST MEDICAL/SURGICAL HISTORY:  Past Medical History:  Diagnosis Date  . 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 Anxiety   . Diabetes ([place])   . Heart attack ([place])   . Heart disease   . High cholesterol   . Hyperplasia of prostate   . Hypertension   . Low back pain   . Port-A-Cath in place 12/30/2019  . Tremor    Past Surgical History:  Procedure Laterality Date  . arterectomy     1993  . BIOPSY  12/01/2019   Procedure: BIOPSY;  Surgeon: Rogene Houston, MD;  Location: [facility] END[facility];  Service: Endoscopy;;  esophagus  . ESOPHAGOGASTRODUODENOSCOPY (EGD) WITH PROPOFOL N/A 12/01/2019   Procedure: ESOPHAGOGASTRODUODENOSCOPY (EGD) WITH PROPOFOL;  Surgeon: Rogene Houston, MD;  Location: [facility] ENDO SUITE;  Service: Endoscopy;  Laterality: N/A;  . ESOPHAGOGASTRODUODENOSCOPY (EGD) WITH PROPOFOL N/A 12/24/2019   Procedure: ESOPHAGOGASTRODUODENOSCOPY (EGD) WITH PROPOFOL;  Surgeon: Aviva Signs, MD;  Location: [facility] ORS;  Service: General;  Laterality: N/A;  . Heart stents     2002, 2004, 2006  . PEG PLACEMENT N/A 12/24/2019   Procedure: PERCUTANEOUS ENDOSCOPIC GASTROSTOMY (PEG) PLACEMENT;  Surgeon: Aviva Signs, MD;  Location: [facility] ORS;  Service: General;  Laterality: N/A;  . PORTACATH PLACEMENT Left 12/24/2019   Procedure: INSERTION PORT-A-CATH;  Surgeon: Aviva Signs, MD;  Location: [facility] ORS;  Service: General;  Laterality: Left;    SOCIAL HISTORY:  Social History   Socioeconomic History  . Marital status:  Married    Spouse name: Not on file  . Number of children: 2  . Years of education: 31  . Highest education level: Not on file  Occupational History  . Occupation: [employer]    Comment: Delivers medication  Tobacco Use  . Smoking status: Former Smoker    Packs/day: 1.00    Years: 50.00    Pack years: 50.00    Quit date: 12/21/2017    Years since quitting: 2.4  . Smokeless tobacco: Never Used  . Tobacco comment: Quit 2017  Vaping Use  . Vaping Use: Never used  Substance and Sexual Activity  . Alcohol use: Yes    Alcohol/week: 0.0 standard drinks    Comment: 3 drinks per week  . Drug use: No  . Sexual activity: Yes  Other Topics Concern  . Not on file  Social History Narrative   Lives at home with his wife.   Right-handed.   2 diet cokes per day.   Social Determinants of Health   Financial Resource Strain: Low Risk   . Difficulty of Paying Living Expenses: Not hard at all  Food Insecurity: No Food Insecurity  . Worried About Charity fundraiser in the Last Year: Never true  . Ran Out of Food in the Last Year: Never true  Transportation Needs: No Transportation Needs  . Lack  of Transportation (Medical): No  . Lack of Transportation (Non-Medical): No  Physical Activity: Insufficiently Active  . Days of Exercise per Week: 2 days  . Minutes of Exercise per Session: 30 min  Stress: No Stress Concern Present  . Feeling of Stress : Only a little  Social Connections: Socially Integrated  . Frequency of Communication with Friends and Family: More than three times a week  . Frequency of Social Gatherings with Friends and Family: More than three times a week  . Attends Religious Services: 1 to 4 times per year  . Active Member of Clubs or Organizations: No  . Attends Archivist Meetings: 1 to 4 times per year  . Marital Status: Married  Human resources officer Violence: Not At Risk  . Fear of Current or Ex-Partner: No  . Emotionally Abused: No  . Physically Abused: No    . Sexually Abused: No    FAMILY HISTORY:  Family History  Problem Relation Age of Onset  . Alzheimer's disease Mother   . Diabetes Father   . Heart disease Father   . Stroke Father   . Heart disease Brother   . Colon cancer Brother   . Alzheimer's disease Sister     CURRENT MEDICATIONS:  Current Outpatient Medications  Medication Sig Dispense Refill  . ALPRAZolam (XANAX) 0.5 MG tablet Take 1 tablet (0.5 mg total) by mouth 2 (two) times daily as needed for anxiety. 30 tablet 0  . ascorbic acid (VITAMIN C) 500 MG tablet Take 500 mg by mouth 2 (two) times a week.     Marland Kitchen aspirin EC 81 MG tablet Take 81 mg by mouth daily.     Marland Kitchen atorvastatin (LIPITOR) 80 MG tablet Take 80 mg by mouth every evening.     . calcium-vitamin D (OSCAL WITH D) 500-200 MG-UNIT tablet Take 2 tablets by mouth 2 (two) times daily.    Marland Kitchen co-enzyme Q-10 30 MG capsule Take 30 mg by mouth daily.    Marland Kitchen ezetimibe (ZETIA) 10 MG tablet Take 10 mg by mouth daily.     Marland Kitchen FLUOROURACIL IV Inject into the vein every 14 (fourteen) days.    . folic acid (FOLVITE) 696 MCG tablet Take 800 mcg by mouth daily.     Marland Kitchen HYDROcodone-acetaminophen (HYCET) 7.5-325 mg/15 ml solution Take 15 mLs by mouth every 4 (four) hours as needed for moderate pain. 120 mL 0  . isosorbide mononitrate (IMDUR) 60 MG 24 hr tablet Take 60 mg by mouth daily.    Marland Kitchen LEUCOVORIN CALCIUM IV Inject into the vein every 14 (fourteen) days.    Marland Kitchen lidocaine-prilocaine (EMLA) cream Apply a dime-sized amount to port a cath site and cover with plastic wrap 1 hour prior to chemotherapy appointments 30 g 3  . losartan (COZAAR) 50 MG tablet Take 25 mg by mouth daily.    . metFORMIN (GLUCOPHAGE-XR) 500 MG 24 hr tablet Take 2,000 mg by mouth every morning.    . metoprolol tartrate (LOPRESSOR) 25 MG tablet Take 0.5 tablets (12.5 mg total) by mouth 2 (two) times daily. (Patient taking differently: Take 25 mg by mouth 2 (two) times daily. ) 60 tablet 1  . Multiple Vitamin  (MULTI-VITAMINS) TABS Take 1 tablet by mouth daily.     Marland Kitchen NIVOLUMAB IV Inject into the vein every 14 (fourteen) days.    . OXALIPLATIN IV Inject into the vein every 14 (fourteen) days.    . prochlorperazine (COMPAZINE) 10 MG tablet Take 1 tablet (10 mg total) by  mouth every 6 (six) hours as needed (Nausea or vomiting). 30 tablet 1  . tamsulosin (FLOMAX) 0.4 MG CAPS capsule Take 0.4 mg by mouth daily.      No current facility-administered medications for this visit.    ALLERGIES:  No Known Allergies  PHYSICAL EXAM:  Performance status (ECOG): 1 - Symptomatic but completely ambulatory  Vitals:   06/14/20 0820  BP: 126/71  Pulse: 66  Resp: 18  Temp: 98.2 F (36.8 C)  SpO2: 99%   Wt Readings from Last 3 Encounters:  06/14/20 211 lb (95.7 kg)  06/08/20 210 lb 3.2 oz (95.3 kg)  05/24/20 209 lb 3.2 oz (94.9 kg)   Physical Exam Vitals reviewed.  Constitutional:      Appearance: Normal appearance. He is obese.  Neurological:     General: No focal deficit present.     Mental Status: He is alert and oriented to person, place, and time.  Psychiatric:        Mood and Affect: Mood normal.        Behavior: Behavior normal.     LABORATORY DATA:  I have reviewed the labs as listed.  CBC Latest Ref Rng & Units 06/14/2020 06/08/2020 05/24/2020  WBC 4.0 - 10.5 K/uL 2.9(L) 3.0(L) 4.0  Hemoglobin 13.0 - 17.0 g/dL 11.3(L) 11.1(L) 11.1(L)  Hematocrit 39 - 52 % 34.9(L) 33.1(L) 33.4(L)  Platelets 150 - 400 K/uL 267 137(L) 133(L)   CMP Latest Ref Rng & Units 06/14/2020 06/08/2020 05/24/2020  Glucose 70 - 99 mg/dL 230(H) 161(H) 266(H)  BUN 8 - 23 mg/dL _0 Creatinine 0.61 - 1.24 mg/dL 0.74 0.68 0.81  Sodium 135 - 145 mmol/L 138 140 136  Potassium 3.5 - 5.1 mmol/L 3.8 3.7 3.5  Chloride 98 - 111 mmol/L 104 106 103  CO2 22 - 32 mmol/L _1 Calcium 8.9 - 10.3 mg/dL 9.3 9.0 8.4(L)  Total Protein 6.5 - 8.1 g/dL 6.4(L) 6.4(L) 6.1(L)  Total Bilirubin 0.3 - 1.2 mg/dL 0.5 0.5 0.4  Alkaline  Phos 38 - 126 U/L 42 45 44  AST 15 - 41 U/L _2 ALT 0 - 44 U/L _3 Lab Results  Component Value Date   LDH 131 06/14/2020   LDH 145 03/24/2020   Lab Results  Component Value Date   CEA1 2.7 05/10/2020   CEA1 2.7 04/12/2020    DIAGNOSTIC IMAGING:  I have independently reviewed the scans and discussed with the patient. CT CHEST W CONTRAST  Result Date: 06/10/2020 CLINICAL DATA:  Adenocarcinoma at the gastroesophageal junction. EXAM: CT CHEST, ABDOMEN, AND PELVIS WITH CONTRAST TECHNIQUE: Multidetector CT imaging of the chest, abdomen and pelvis was performed following the standard protocol during bolus administration of intravenous contrast. CONTRAST:  168m OMNIPAQUE IOHEXOL 300 MG/ML  SOLN COMPARISON:  PET-CT 03/22/2020.  Chest CT 11/30/2019 FINDINGS: CT CHEST FINDINGS Cardiovascular: Severe three-vessel coronary artery atherosclerosis with lesser involvement of the aorta and great vessels. No acute vascular findings. Left subclavian Port-A-Cath extends to the mid SVC. The heart size is normal. There is no pericardial effusion. Mediastinum/Nodes: 13 mm subcarinal node on image 34/2 is improved from the baseline chest CT and similar in size to the most recent PET-CT. No other enlarged mediastinal, hilar or axillary lymph nodes. Mild wall thickening of the distal esophagus without residual or recurrent mass lesion. The trachea and thyroid gland appear normal. Lungs/Pleura: There is no pleural effusion. The tree-in-bud nodularity seen on the baseline chest  CT has resolved. 4 mm right lower lobe nodule on image 92/3 is unchanged. No new or enlarging pulmonary nodules. Musculoskeletal/Chest wall: No chest wall mass or suspicious osseous findings. CT ABDOMEN AND PELVIS FINDINGS Hepatobiliary: The liver is normal in density without suspicious focal abnormality. No evidence of gallstones, gallbladder wall thickening or biliary dilatation. Pancreas: Unremarkable. No pancreatic ductal dilatation  or surrounding inflammatory changes. Spleen: Normal in size without focal abnormality. Adrenals/Urinary Tract: Both adrenal glands appear normal. Stable left renal cysts. No evidence of renal mass, urinary tract calculus or hydronephrosis. Stable mild generalized bladder wall thickening without focal abnormality. Stomach/Bowel: No evidence of bowel wall thickening, distention or surrounding inflammatory change. Percutaneous G-tube appears unchanged. Mild diverticular changes within the sigmoid colon. Vascular/Lymphatic: There are no enlarged abdominal or pelvic lymph nodes. Small residual lymph nodes within the gastrohepatic ligament and porta hepatis are unchanged from most recent PET-CT. Diffuse aortic and branch vessel atherosclerosis without acute vascular findings. The portal, superior mesenteric and splenic veins are patent. Reproductive: Stable marked enlargement of the prostate gland. Other: Stable small umbilical hernia containing only fat. No ascites or peritoneal nodularity. Musculoskeletal: No acute or significant osseous findings. Multilevel lumbar spondylosis. IMPRESSION: 1. Interval resolution of previously demonstrated tree-in-bud nodularity in the lungs, consistent with a resolved inflammatory process. No evidence of local recurrence or metastatic disease. 2. Stable mildly enlarged subcarinal lymph node and small residual lymph nodes within the gastrohepatic ligament and porta hepatis, unchanged from most recent PET-CT. 3. Aortic Atherosclerosis (ICD10-I70.0). 4. Prostatomegaly. Electronically Signed   By: Richardean Sale M.D.   On: 06/10/2020 10:52   CT ABDOMEN PELVIS W CONTRAST  Result Date: 06/10/2020 CLINICAL DATA:  Adenocarcinoma at the gastroesophageal junction. EXAM: CT CHEST, ABDOMEN, AND PELVIS WITH CONTRAST TECHNIQUE: Multidetector CT imaging of the chest, abdomen and pelvis was performed following the standard protocol during bolus administration of intravenous contrast. CONTRAST:   172m OMNIPAQUE IOHEXOL 300 MG/ML  SOLN COMPARISON:  PET-CT 03/22/2020.  Chest CT 11/30/2019 FINDINGS: CT CHEST FINDINGS Cardiovascular: Severe three-vessel coronary artery atherosclerosis with lesser involvement of the aorta and great vessels. No acute vascular findings. Left subclavian Port-A-Cath extends to the mid SVC. The heart size is normal. There is no pericardial effusion. Mediastinum/Nodes: 13 mm subcarinal node on image 34/2 is improved from the baseline chest CT and similar in size to the most recent PET-CT. No other enlarged mediastinal, hilar or axillary lymph nodes. Mild wall thickening of the distal esophagus without residual or recurrent mass lesion. The trachea and thyroid gland appear normal. Lungs/Pleura: There is no pleural effusion. The tree-in-bud nodularity seen on the baseline chest CT has resolved. 4 mm right lower lobe nodule on image 92/3 is unchanged. No new or enlarging pulmonary nodules. Musculoskeletal/Chest wall: No chest wall mass or suspicious osseous findings. CT ABDOMEN AND PELVIS FINDINGS Hepatobiliary: The liver is normal in density without suspicious focal abnormality. No evidence of gallstones, gallbladder wall thickening or biliary dilatation. Pancreas: Unremarkable. No pancreatic ductal dilatation or surrounding inflammatory changes. Spleen: Normal in size without focal abnormality. Adrenals/Urinary Tract: Both adrenal glands appear normal. Stable left renal cysts. No evidence of renal mass, urinary tract calculus or hydronephrosis. Stable mild generalized bladder wall thickening without focal abnormality. Stomach/Bowel: No evidence of bowel wall thickening, distention or surrounding inflammatory change. Percutaneous G-tube appears unchanged. Mild diverticular changes within the sigmoid colon. Vascular/Lymphatic: There are no enlarged abdominal or pelvic lymph nodes. Small residual lymph nodes within the gastrohepatic ligament and porta hepatis are unchanged from most  recent PET-CT. Diffuse aortic and branch vessel atherosclerosis without acute vascular findings. The portal, superior mesenteric and splenic veins are patent. Reproductive: Stable marked enlargement of the prostate gland. Other: Stable small umbilical hernia containing only fat. No ascites or peritoneal nodularity. Musculoskeletal: No acute or significant osseous findings. Multilevel lumbar spondylosis. IMPRESSION: 1. Interval resolution of previously demonstrated tree-in-bud nodularity in the lungs, consistent with a resolved inflammatory process. No evidence of local recurrence or metastatic disease. 2. Stable mildly enlarged subcarinal lymph node and small residual lymph nodes within the gastrohepatic ligament and porta hepatis, unchanged from most recent PET-CT. 3. Aortic Atherosclerosis (ICD10-I70.0). 4. Prostatomegaly. Electronically Signed   By: Richardean Sale M.D.   On: 06/10/2020 10:52     ASSESSMENT:  1. 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 HER-2 FISH to be followed up. PD-L1 CPS-5%. -FOLFOX and Opdivo started on 12/31/2019. -PET scan on 03/22/2020 showed marked reduction in size and metabolic activity in the distal esophageal lesion, marked reduction in size of mediastinal adenopathy. Near complete resolution with only 2 small mildly metabolic mediastinal lymph nodes remaining. Resolution of lymph nodes in the upper abdomen. Right hip bone lesion is still present but better. -CT CAP on 06/09/2020 showed interval resolution of previously demonstrated tree-in-bud nodularity in the lungs.  Stable enlarged subcarinal lymph node and small residual lymph nodes with the gastrohepatic ligament and porta hepatis unchanged from the most recent PET scan.   PLAN:  1. Stage IV GE junction adenocarcinoma: -We reviewed results of the CT CAP from 06/09/2020. -Lymph nodes in the gastrohepatic ligament and porta hepatis are more or less stable compared to last PET scan.  I  have recommended a PET CT scan to look at the activity in these lymph nodes.  If they are negative, I would de-escalate chemotherapy by discontinuing oxaliplatin.  PET scan will also help [place] to determine the primary tumor.  If it continues to show improvement, will consider discontinuing PEG tube. -Labs today showed white count is 2.9 with ANC of 1000.  He cannot receive any chemotherapy today.  We will reevaluate him in 2 weeks.  2. Nutrition: -He has not used the PEG tube in the last few months. -If there is no activity on the PET scan will consider discontinuing PEG tube.  3. Bone mets: -We will continue Zometa monthly.  Calcium is normal.  4. Diabetes: -Continue Metformin 1 g daily.  5. Anxiety: -Continue Xanax 0.5 mg as needed.  6. Hypertension: -Continue Lopressor 25 mg daily.   Orders placed this encounter:  Orders Placed This Encounter  Procedures  . NM PET Image Initial (PI) Skull Base To Thigh     Derek Jack, MD [place] [phone]   I, Milinda Antis, am acting as a scribe for Dr. Sanda Linger.  I, Derek Jack MD, have reviewed the above documentation for accuracy and completeness, and I agree with the above.

## 2020-06-15 LAB — CEA: CEA: 3.1 ng/mL (ref 0.0–4.7)

## 2020-06-16 ENCOUNTER — Encounter (HOSPITAL_COMMUNITY): Payer: Medicare HMO

## 2020-06-21 ENCOUNTER — Other Ambulatory Visit (HOSPITAL_COMMUNITY): Payer: Self-pay | Admitting: *Deleted

## 2020-06-21 ENCOUNTER — Inpatient Hospital Stay (HOSPITAL_COMMUNITY): Payer: Medicare HMO | Admitting: Hematology

## 2020-06-21 ENCOUNTER — Inpatient Hospital Stay (HOSPITAL_COMMUNITY): Payer: Medicare HMO

## 2020-06-21 ENCOUNTER — Other Ambulatory Visit: Payer: Self-pay

## 2020-06-21 ENCOUNTER — Encounter (HOSPITAL_COMMUNITY): Payer: Self-pay

## 2020-06-21 ENCOUNTER — Ambulatory Visit (HOSPITAL_COMMUNITY): Payer: Medicare HMO

## 2020-06-21 VITALS — BP 112/71 | HR 74 | Temp 96.8°F | Resp 20 | Wt 206.5 lb

## 2020-06-21 DIAGNOSIS — C160 Malignant neoplasm of cardia: Secondary | ICD-10-CM

## 2020-06-21 DIAGNOSIS — Z5111 Encounter for antineoplastic chemotherapy: Secondary | ICD-10-CM | POA: Diagnosis not present

## 2020-06-21 LAB — CBC WITH DIFFERENTIAL/PLATELET
Abs Immature Granulocytes: 0.02 10*3/uL (ref 0.00–0.07)
Basophils Absolute: 0 10*3/uL (ref 0.0–0.1)
Basophils Relative: 0 %
Eosinophils Absolute: 0.1 10*3/uL (ref 0.0–0.5)
Eosinophils Relative: 1 %
HCT: 37.6 % — ABNORMAL LOW (ref 39.0–52.0)
Hemoglobin: 12.3 g/dL — ABNORMAL LOW (ref 13.0–17.0)
Immature Granulocytes: 0 %
Lymphocytes Relative: 21 %
Lymphs Abs: 1.6 10*3/uL (ref 0.7–4.0)
MCH: 32.6 pg (ref 26.0–34.0)
MCHC: 32.7 g/dL (ref 30.0–36.0)
MCV: 99.7 fL (ref 80.0–100.0)
Monocytes Absolute: 0.8 10*3/uL (ref 0.1–1.0)
Monocytes Relative: 11 %
Neutro Abs: 4.8 10*3/uL (ref 1.7–7.7)
Neutrophils Relative %: 67 %
Platelets: 269 10*3/uL (ref 150–400)
RBC: 3.77 MIL/uL — ABNORMAL LOW (ref 4.22–5.81)
RDW: 13.7 % (ref 11.5–15.5)
WBC: 7.3 10*3/uL (ref 4.0–10.5)
nRBC: 0 % (ref 0.0–0.2)

## 2020-06-21 LAB — COMPREHENSIVE METABOLIC PANEL
ALT: 27 U/L (ref 0–44)
AST: 27 U/L (ref 15–41)
Albumin: 3.8 g/dL (ref 3.5–5.0)
Alkaline Phosphatase: 46 U/L (ref 38–126)
Anion gap: 9 (ref 5–15)
BUN: 20 mg/dL (ref 8–23)
CO2: 28 mmol/L (ref 22–32)
Calcium: 9.5 mg/dL (ref 8.9–10.3)
Chloride: 99 mmol/L (ref 98–111)
Creatinine, Ser: 0.8 mg/dL (ref 0.61–1.24)
GFR calc Af Amer: >60 mL/min (ref >60)
GFR calc non Af Amer: >60 mL/min (ref >60)
Glucose, Bld: 150 mg/dL — ABNORMAL HIGH (ref 70–99)
Potassium: 4.5 mmol/L (ref 3.5–5.1)
Sodium: 136 mmol/L (ref 135–145)
Total Bilirubin: 0.3 mg/dL (ref 0.3–1.2)
Total Protein: 7.2 g/dL (ref 6.5–8.1)

## 2020-06-21 NOTE — Progress Notes (Signed)
Patient called clinic and reported increased horseness over the past several weeks.  He reports increased weakness.  He has been unable to get his last 2 treatments due to decreased blood counts.  He asked to be seen today.  Patient added on for labs and visit with provider.

## 2020-06-21 NOTE — Progress Notes (Signed)
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[place])   12/29/2019 Cancer Staging   Staging form: Esophagus - Adenocarcinoma, AJCC 8th Edition - Clinical stage from 12/29/2019: Stage IVB (cTX, cN3, cM1) - Signed by Derek Jack, MD on 12/29/2019   12/31/2019 -  Chemotherapy   The patient had palonosetron (ALOXI) injection 0.25 mg, 0.25 mg, Intravenous,  Once, 11 of 12 cycles Administration: 0.25 mg (12/31/2019), 0.25 mg (01/14/2020), 0.25 mg (01/28/2020), 0.25 mg (02/11/2020), 0.25 mg (02/25/2020), 0.25 mg (03/10/2020), 0.25 mg (03/24/2020), 0.25 mg (04/12/2020), 0.25 mg (04/26/2020), 0.25 mg (05/10/2020), 0.25 mg (05/24/2020) leucovorin 868 mg in dextrose 5 % 250 mL infusion, 400 mg/m2 = 868 mg, Intravenous,  Once, 11 of 12 cycles Administration: 868 mg (12/31/2019), 868 mg (01/14/2020), 868 mg (01/28/2020), 868 mg (02/11/2020), 868 mg (02/25/2020), 868 mg (03/10/2020), 868 mg (03/24/2020), 868 mg (04/12/2020), 868 mg (04/26/2020), 868 mg (05/10/2020), 868 mg (05/24/2020) oxaliplatin (ELOXATIN) 150 mg in dextrose 5 % 500 mL chemo infusion, 68 mg/m2 = 150 mg (80 % of original dose 85 mg/m2), Intravenous,  Once, 11 of 12 cycles Dose modification: 68 mg/m2 (80 % of original dose 85 mg/m2, Cycle 1, Reason: Other (see comments), Comment: diabetic neuropathy) Administration: 150 mg (12/31/2019), 150 mg (01/14/2020), 150 mg (01/28/2020), 150 mg (02/11/2020), 150 mg (02/25/2020), 150 mg (03/10/2020), 150 mg  (03/24/2020), 150 mg (04/12/2020), 150 mg (04/26/2020), 150 mg (05/10/2020), 150 mg (05/24/2020) fluorouracil (ADRUCIL) chemo injection 850 mg, 400 mg/m2 = 850 mg, Intravenous,  Once, 11 of 12 cycles Administration: 850 mg (12/31/2019), 850 mg (01/14/2020), 850 mg (01/28/2020), 850 mg (02/11/2020), 850 mg (02/25/2020), 850 mg (03/10/2020), 850 mg (03/24/2020), 850 mg (04/12/2020), 850 mg (04/26/2020), 850 mg (05/10/2020), 850 mg (05/24/2020) fluorouracil (ADRUCIL) 5,000 mg in sodium chloride 0.9 % 150 mL chemo infusion, 2,310 mg/m2 = 5,200 mg, Intravenous, 1 Day/Dose, 11 of 12 cycles Administration: 5,000 mg (12/31/2019), 5,000 mg (01/14/2020), 5,000 mg (01/28/2020), 5,000 mg (02/11/2020), 5,000 mg (02/25/2020), 5,000 mg (03/10/2020), 5,000 mg (03/24/2020), 5,000 mg (04/12/2020), 5,000 mg (04/26/2020), 5,000 mg (05/10/2020), 5,000 mg (05/24/2020) nivolumab (OPDIVO) 240 mg in sodium chloride 0.9 % 100 mL chemo infusion, 240 mg (100 % of original dose 240 mg), Intravenous, Once, 11 of 12 cycles Dose modification: 240 mg (original dose 240 mg, Cycle 1, Reason: Provider Judgment) Administration: 240 mg (12/31/2019), 240 mg (01/14/2020), 240 mg (01/28/2020), 240 mg (02/11/2020), 240 mg (02/25/2020), 240 mg (03/10/2020), 240 mg (03/24/2020), 240 mg (04/12/2020), 240 mg (04/26/2020), 240 mg (05/10/2020), 240 mg (05/24/2020)  for chemotherapy treatment.      CANCER STAGING: Cancer Staging Adenocarcinoma of gastroesophageal junction [facility]) Staging form: Esophagus - Adenocarcinoma, AJCC 8th Edition - Clinical stage from 12/29/2019: Stage IVB (cTX, cN3, cM1) - Signed by Derek Jack, MD on 12/29/2019   INTERVAL HISTORY:  [name], a 69 y.o. male, returns for routine follow-up of his stage IV GE junction adenocarcinoma. Timothy Oconnor was last seen on 06/14/2020.  Today he is accompanied by his wife. He reports that his  PET scan is not approved by insurance yet. His voice has not returned completely and he is having trouble coughing, and  bringing up clear sputum. His voice was gone 1.5 weeks ago and is slowly returning. He also complains of halitosis. He started having left abdominal soreness under the costal margin since 06/15/2020 after tipping over cat litter at home; over the last 2 days the soreness has progressed to shooting pains.   REVIEW OF SYSTEMS:  Review of Systems  Constitutional: Positive for appetite change (moderately decreased) and fatigue (depleted).  HENT:   Positive for voice change (hoarse voice). Negative for mouth sores and sore throat.   Respiratory: Positive for cough (clear sputum).   Gastrointestinal: Positive for abdominal pain.  Neurological: Positive for headaches and numbness (bottom of feet).  Psychiatric/Behavioral: Positive for sleep disturbance.  All other systems reviewed and are negative.   PAST MEDICAL/SURGICAL HISTORY:  Past Medical History:  Diagnosis Date  . 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[place])   . Anxiety   . Diabetes ([place])   . Heart attack ([place])   . Heart disease   . High cholesterol   . Hyperplasia of prostate   . Hypertension   . Low back pain   . Port-A-Cath in place 12/30/2019  . Tremor    Past Surgical History:  Procedure Laterality Date  . arterectomy     1993  . BIOPSY  12/01/2019   Procedure: BIOPSY;  Surgeon: Rogene Houston, MD;  Location: [facility];  Service: Endoscopy;;  esophagus  . ESOPHAGOGASTRODUODENOSCOPY (EGD) WITH PROPOFOL N/A 12/01/2019   Procedure: ESOPHAGOGASTRODUODENOSCOPY (EGD) WITH PROPOFOL;  Surgeon: Rogene Houston, MD;  Location: [facility];  Service: Endoscopy;  Laterality: N/A;  . ESOPHAGOGASTRODUODENOSCOPY (EGD) WITH PROPOFOL N/A 12/24/2019   Procedure: ESOPHAGOGASTRODUODENOSCOPY (EGD) WITH PROPOFOL;  Surgeon: Aviva Signs, MD;  Location: [facility] ORS;  Service: General;  Laterality: N/A;  . Heart stents     2002, 2004, 2006  . PEG PLACEMENT N/A 12/24/2019   Procedure: PERCUTANEOUS ENDOSCOPIC GASTROSTOMY (PEG) PLACEMENT;   Surgeon: Aviva Signs, MD;  Location: [facility] ORS;  Service: General;  Laterality: N/A;  . PORTACATH PLACEMENT Left 12/24/2019   Procedure: INSERTION PORT-A-CATH;  Surgeon: Aviva Signs, MD;  Location: [facility] ORS;  Service: General;  Laterality: Left;    SOCIAL HISTORY:  Social History   Socioeconomic History  . Marital status: Married    Spouse name: Not on file  . Number of children: 2  . Years of education: 68  . Highest education level: Not on file  Occupational History  . Occupation: [employer]    Comment: Delivers medication  Tobacco Use  . Smoking status: Former Smoker    Packs/day: 1.00    Years: 50.00    Pack years: 50.00    Quit date: 12/21/2017    Years since quitting: 2.5  . Smokeless tobacco: Never Used  . Tobacco comment: Quit 2017  Vaping Use  . Vaping Use: Never used  Substance and Sexual Activity  . Alcohol use: Yes    Alcohol/week: 0.0 standard drinks    Comment: 3 drinks per week  . Drug use: No  . Sexual activity: Yes  Other Topics Concern  . Not on file  Social History Narrative   Lives at home with his wife.   Right-handed.   2 diet cokes per day.   Social Determinants of Health   Financial Resource Strain: Low Risk   . Difficulty of Paying Living Expenses: Not hard at all  Food Insecurity: No Food Insecurity  . Worried About Charity fundraiser in the Last Year: Never true  . Ran Out of Food in the Last Year: Never true  Transportation Needs: No Transportation Needs  . Lack of Transportation (Medical): No  . Lack of Transportation (Non-Medical): No  Physical Activity: Insufficiently Active  . Days of Exercise per Week: 2 days  . Minutes of Exercise per Session: 30 min  Stress: No Stress Concern Present  . Feeling of Stress : Only a little  Social Connections: Socially Integrated  . Frequency of Communication with Friends and Family: More than three times a week  . Frequency of Social Gatherings with Friends and Family: More than three times a  week  . Attends Religious Services: 1 to 4 times per year  . Active Member of Clubs or Organizations: No  . Attends Archivist Meetings: 1 to 4 times per year  . Marital Status: Married  Human resources officer Violence: Not At Risk  . Fear of Current or Ex-Partner: No  . Emotionally Abused: No  . Physically Abused: No  . Sexually Abused: No    FAMILY HISTORY:  Family History  Problem Relation Age of Onset  . Alzheimer's disease Mother   . Diabetes Father   . Heart disease Father   . Stroke Father   . Heart disease Brother   . Colon cancer Brother   . Alzheimer's disease Sister     CURRENT MEDICATIONS:  Current Outpatient Medications  Medication Sig Dispense Refill  . ascorbic acid (VITAMIN C) 500 MG tablet Take 500 mg by mouth 2 (two) times a week.     Marland Kitchen aspirin EC 81 MG tablet Take 81 mg by mouth daily.     Marland Kitchen atorvastatin (LIPITOR) 80 MG tablet Take 80 mg by mouth every evening.     . calcium-vitamin D (OSCAL WITH D) 500-200 MG-UNIT tablet Take 2 tablets by mouth 2 (two) times daily.    Marland Kitchen co-enzyme Q-10 30 MG capsule Take 30 mg by mouth daily.    Marland Kitchen ezetimibe (ZETIA) 10 MG tablet Take 10 mg by mouth daily.     Marland Kitchen FLUOROURACIL IV Inject into the vein every 14 (fourteen) days.    . folic acid (FOLVITE) 725 MCG tablet Take 800 mcg by mouth daily.     . isosorbide mononitrate (IMDUR) 60 MG 24 hr tablet Take 60 mg by mouth daily.    Marland Kitchen LEUCOVORIN CALCIUM IV Inject into the vein every 14 (fourteen) days.    Marland Kitchen losartan (COZAAR) 50 MG tablet Take 25 mg by mouth daily.    . metFORMIN (GLUCOPHAGE-XR) 500 MG 24 hr tablet Take 2,000 mg by mouth every morning.    . metoprolol tartrate (LOPRESSOR) 25 MG tablet Take 0.5 tablets (12.5 mg total) by mouth 2 (two) times daily. (Patient taking differently: Take 25 mg by mouth 2 (two) times daily. ) 60 tablet 1  . Multiple Vitamin (MULTI-VITAMINS) TABS Take 1 tablet by mouth daily.     Marland Kitchen NIVOLUMAB IV Inject into the vein every 14 (fourteen)  days.    . OXALIPLATIN IV Inject into the vein every 14 (fourteen) days.    . tamsulosin (FLOMAX) 0.4 MG CAPS capsule Take 0.4 mg by mouth daily.     Marland Kitchen ALPRAZolam (XANAX) 0.5 MG tablet Take 1 tablet (0.5 mg total) by mouth 2 (two) times daily as needed for anxiety. (Patient not taking: Reported on 06/21/2020) 30 tablet 0  . HYDROcodone-acetaminophen (HYCET) 7.5-325  mg/15 ml solution Take 15 mLs by mouth every 4 (four) hours as needed for moderate pain. (Patient not taking: Reported on 06/21/2020) 120 mL 0  . lidocaine-prilocaine (EMLA) cream Apply a dime-sized amount to port a cath site and cover with plastic wrap 1 hour prior to chemotherapy appointments (Patient not taking: Reported on 06/21/2020) 30 g 3  . prochlorperazine (COMPAZINE) 10 MG tablet Take 1 tablet (10 mg total) by mouth every 6 (six) hours as needed (Nausea or vomiting). (Patient not taking: Reported on 06/21/2020) 30 tablet 1   No current facility-administered medications for this visit.    ALLERGIES:  No Known Allergies  PHYSICAL EXAM:  Performance status (ECOG): 1 - Symptomatic but completely ambulatory  Vitals:   06/21/20 1543  BP: 112/71  Pulse: 74  Resp: 20  Temp: (!) 96.8 F (36 C)  SpO2: 95%   Wt Readings from Last 3 Encounters:  06/21/20 206 lb 8 oz (93.7 kg)  06/14/20 211 lb (95.7 kg)  06/08/20 210 lb 3.2 oz (95.3 kg)   Physical Exam Vitals reviewed.  Constitutional:      Appearance: Normal appearance.  HENT:     Mouth/Throat:     Lips: No lesions.     Mouth: Mucous membranes are moist. No injury or oral lesions.     Dentition: No gum lesions.     Tongue: No lesions.     Palate: No lesions.     Pharynx: Oropharynx is clear. No posterior oropharyngeal erythema.  Cardiovascular:     Rate and Rhythm: Normal rate and regular rhythm.     Pulses: Normal pulses.     Heart sounds: Normal heart sounds.  Pulmonary:     Effort: Pulmonary effort is normal.     Breath sounds: Normal breath sounds.    Abdominal:     Palpations: Abdomen is soft. There is no hepatomegaly or splenomegaly.     Tenderness: There is abdominal tenderness in the right upper quadrant.     Hernia: A hernia is present. Hernia is present in the umbilical area.  Musculoskeletal:     Right lower leg: No edema.     Left lower leg: No edema.  Neurological:     General: No focal deficit present.     Mental Status: He is alert and oriented to person, place, and time.  Psychiatric:        Mood and Affect: Mood normal.        Behavior: Behavior normal.      LABORATORY DATA:  I have reviewed the labs as listed.  CBC Latest Ref Rng & Units 06/21/2020 06/14/2020 06/08/2020  WBC 4.0 - 10.5 K/uL 7.3 2.9(L) 3.0(L)  Hemoglobin 13.0 - 17.0 g/dL 12.3(L) 11.3(L) 11.1(L)  Hematocrit 39 - 52 % 37.6(L) 34.9(L) 33.1(L)  Platelets 150 - 400 K/uL 269 267 137(L)   CMP Latest Ref Rng & Units 06/21/2020 06/14/2020 06/08/2020  Glucose 70 - 99 mg/dL 150(H) 230(H) 161(H)  BUN 8 - 23 mg/dL _0 Creatinine 0.61 - 1.24 mg/dL 0.80 0.74 0.68  Sodium 135 - 145 mmol/L 136 138 140  Potassium 3.5 - 5.1 mmol/L 4.5 3.8 3.7  Chloride 98 - 111 mmol/L 99 104 106  CO2 22 - 32 mmol/L _1 Calcium 8.9 - 10.3 mg/dL 9.5 9.3 9.0  Total Protein 6.5 - 8.1 g/dL 7.2 6.4(L) 6.4(L)  Total Bilirubin 0.3 - 1.2 mg/dL 0.3 0.5 0.5  Alkaline Phos 38 - 126 U/L 46 42 45  AST 15 - 41 U/L _0 ALT 0 - 44 U/L _1 Lab Results  Component Value Date   LDH 131 06/14/2020   LDH 145 03/24/2020   Lab Results  Component Value Date   CEA1 3.1 06/14/2020   CEA1 2.7 05/10/2020   CEA1 2.7 04/12/2020     DIAGNOSTIC IMAGING:  I have independently reviewed the scans and discussed with the patient. CT CHEST W CONTRAST  Result Date: 06/10/2020 CLINICAL DATA:  Adenocarcinoma at the gastroesophageal junction. EXAM: CT CHEST, ABDOMEN, AND PELVIS WITH CONTRAST TECHNIQUE: Multidetector CT imaging of the chest, abdomen and pelvis was performed following the  standard protocol during bolus administration of intravenous contrast. CONTRAST:  163m OMNIPAQUE IOHEXOL 300 MG/ML  SOLN COMPARISON:  PET-CT 03/22/2020.  Chest CT 11/30/2019 FINDINGS: CT CHEST FINDINGS Cardiovascular: Severe three-vessel coronary artery atherosclerosis with lesser involvement of the aorta and great vessels. No acute vascular findings. Left subclavian Port-A-Cath extends to the mid SVC. The heart size is normal. There is no pericardial effusion. Mediastinum/Nodes: 13 mm subcarinal node on image 34/2 is improved from the baseline chest CT and similar in size to the most recent PET-CT. No other enlarged mediastinal, hilar or axillary lymph nodes. Mild wall thickening of the distal esophagus without residual or recurrent mass lesion. The trachea and thyroid gland appear normal. Lungs/Pleura: There is no pleural effusion. The tree-in-bud nodularity seen on the baseline chest CT has resolved. 4 mm right lower lobe nodule on image 92/3 is unchanged. No new or enlarging pulmonary nodules. Musculoskeletal/Chest wall: No chest wall mass or suspicious osseous findings. CT ABDOMEN AND PELVIS FINDINGS Hepatobiliary: The liver is normal in density without suspicious focal abnormality. No evidence of gallstones, gallbladder wall thickening or biliary dilatation. Pancreas: Unremarkable. No pancreatic ductal dilatation or surrounding inflammatory changes. Spleen: Normal in size without focal abnormality. Adrenals/Urinary Tract: Both adrenal glands appear normal. Stable left renal cysts. No evidence of renal mass, urinary tract calculus or hydronephrosis. Stable mild generalized bladder wall thickening without focal abnormality. Stomach/Bowel: No evidence of bowel wall thickening, distention or surrounding inflammatory change. Percutaneous G-tube appears unchanged. Mild diverticular changes within the sigmoid colon. Vascular/Lymphatic: There are no enlarged abdominal or pelvic lymph nodes. Small residual lymph nodes  within the gastrohepatic ligament and porta hepatis are unchanged from most recent PET-CT. Diffuse aortic and branch vessel atherosclerosis without acute vascular findings. The portal, superior mesenteric and splenic veins are patent. Reproductive: Stable marked enlargement of the prostate gland. Other: Stable small umbilical hernia containing only fat. No ascites or peritoneal nodularity. Musculoskeletal: No acute or significant osseous findings. Multilevel lumbar spondylosis. IMPRESSION: 1. Interval resolution of previously demonstrated tree-in-bud nodularity in the lungs, consistent with a resolved inflammatory process. No evidence of local recurrence or metastatic disease. 2. Stable mildly enlarged subcarinal lymph node and small residual lymph nodes within the gastrohepatic ligament and porta hepatis, unchanged from most recent PET-CT. 3. Aortic Atherosclerosis (ICD10-I70.0). 4. Prostatomegaly. Electronically Signed   By: WRichardean SaleM.D.   On: 06/10/2020 10:52   CT ABDOMEN PELVIS W CONTRAST  Result Date: 06/10/2020 CLINICAL DATA:  Adenocarcinoma at the gastroesophageal junction. EXAM: CT CHEST, ABDOMEN, AND PELVIS WITH CONTRAST TECHNIQUE: Multidetector CT imaging of the chest, abdomen and pelvis was performed following the standard protocol during bolus administration of intravenous contrast. CONTRAST:  1026mOMNIPAQUE IOHEXOL 300 MG/ML  SOLN COMPARISON:  PET-CT 03/22/2020.  Chest CT 11/30/2019 FINDINGS: CT CHEST FINDINGS Cardiovascular: Severe three-vessel coronary artery atherosclerosis with lesser  involvement of the aorta and great vessels. No acute vascular findings. Left subclavian Port-A-Cath extends to the mid SVC. The heart size is normal. There is no pericardial effusion. Mediastinum/Nodes: 13 mm subcarinal node on image 34/2 is improved from the baseline chest CT and similar in size to the most recent PET-CT. No other enlarged mediastinal, hilar or axillary lymph nodes. Mild wall thickening of  the distal esophagus without residual or recurrent mass lesion. The trachea and thyroid gland appear normal. Lungs/Pleura: There is no pleural effusion. The tree-in-bud nodularity seen on the baseline chest CT has resolved. 4 mm right lower lobe nodule on image 92/3 is unchanged. No new or enlarging pulmonary nodules. Musculoskeletal/Chest wall: No chest wall mass or suspicious osseous findings. CT ABDOMEN AND PELVIS FINDINGS Hepatobiliary: The liver is normal in density without suspicious focal abnormality. No evidence of gallstones, gallbladder wall thickening or biliary dilatation. Pancreas: Unremarkable. No pancreatic ductal dilatation or surrounding inflammatory changes. Spleen: Normal in size without focal abnormality. Adrenals/Urinary Tract: Both adrenal glands appear normal. Stable left renal cysts. No evidence of renal mass, urinary tract calculus or hydronephrosis. Stable mild generalized bladder wall thickening without focal abnormality. Stomach/Bowel: No evidence of bowel wall thickening, distention or surrounding inflammatory change. Percutaneous G-tube appears unchanged. Mild diverticular changes within the sigmoid colon. Vascular/Lymphatic: There are no enlarged abdominal or pelvic lymph nodes. Small residual lymph nodes within the gastrohepatic ligament and porta hepatis are unchanged from most recent PET-CT. Diffuse aortic and branch vessel atherosclerosis without acute vascular findings. The portal, superior mesenteric and splenic veins are patent. Reproductive: Stable marked enlargement of the prostate gland. Other: Stable small umbilical hernia containing only fat. No ascites or peritoneal nodularity. Musculoskeletal: No acute or significant osseous findings. Multilevel lumbar spondylosis. IMPRESSION: 1. Interval resolution of previously demonstrated tree-in-bud nodularity in the lungs, consistent with a resolved inflammatory process. No evidence of local recurrence or metastatic disease. 2.  Stable mildly enlarged subcarinal lymph node and small residual lymph nodes within the gastrohepatic ligament and porta hepatis, unchanged from most recent PET-CT. 3. Aortic Atherosclerosis (ICD10-I70.0). 4. Prostatomegaly. Electronically Signed   By: Richardean Sale M.D.   On: 06/10/2020 10:52     ASSESSMENT:  1. 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 HER-2 FISH to be followed up. PD-L1 CPS-5%. -FOLFOX and Opdivo started on 12/31/2019. -PET scan on 03/22/2020 showed marked reduction in size and metabolic activity in the distal esophageal lesion, marked reduction in size of mediastinal adenopathy. Near complete resolution with only 2 small mildly metabolic mediastinal lymph nodes remaining. Resolution of lymph nodes in the upper abdomen. Right hip bone lesion is still present but better. -CT CAP on 06/09/2020 showed interval resolution of previously demonstrated tree-in-bud nodularity in the lungs.  Stable enlarged subcarinal lymph node and small residual lymph nodes with the gastrohepatic ligament and porta hepatis unchanged from the most recent PET scan.    PLAN:  1.  Stage IV GE junction adenocarcinoma: -We have reviewed results from CT CAP from 06/09/2020.  Has lymph nodes in the gastrohepatic ligament and porta hepatis or more or less stable compared to prior PET scan, I have recommended PET scan again to look at the activity in these lymph nodes.  We will also evaluate primary lesion with the PET scan.  This was not clearly seen on CT scan.  We will de-escalate chemotherapy by discontinuing oxaliplatin based on the PET scan if the lymph nodes and primary is negative.  2.  Hoarseness  of voice: -He lost his voice about 2 weeks ago.  It has not come back.  He had intermittent loss of voice in the past but it has come back within a few days. -We will make a referral to Dr. Benjamine Mola for direct visualization of vocal cords.  3.  Nutrition: -He has not used PEG tube  in the last several months. -If there is no activity on the PET scan will consider discontinuing PEG tube.  4.  Bone metastasis: -Continue Zometa monthly.  Calcium is normal.  5.  Diabetes: -Continue Metformin.  6.  Hypertension: -Continue Lopressor 25 mg daily.  7. Anxiety: -Continue Xanax 0.5 mg as needed.    Orders placed this encounter:  No orders of the defined types were placed in this encounter.    Derek Jack, MD [place] [phone]   I, Milinda Antis, am acting as a scribe for Dr. Sanda Linger.  I, Derek Jack MD, have reviewed the above documentation for accuracy and completeness, and I agree with the above.

## 2020-06-21 NOTE — Patient Instructions (Addendum)
[place] at [facility] Discharge Instructions  You were seen today by Dr. Delton Coombes. He went over your recent results and scans. You will be referred to ENT for your hoarse voice. Dr. Delton Coombes will see you back in 1 week for labs and follow up.   Thank you for choosing [place] at [facility] to provide your oncology and hematology care.  To afford each patient quality time with our provider, please arrive at least 15 minutes before your scheduled appointment time.   If you have a lab appointment with the [facility] please come in thru the Main Entrance and check in at the main information desk  You need to re-schedule your appointment should you arrive 10 or more minutes late.  We strive to give you quality time with our providers, and arriving late affects you and other patients whose appointments are after yours.  Also, if you no show three or more times for appointments you may be dismissed from the clinic at the providers discretion.     Again, thank you for choosing [facility].  Our hope is that these requests will decrease the amount of time that you wait before being seen by our physicians.       _____________________________________________________________  Should you have questions after your visit to [facility], please contact our office at [phone] between the hours of 8:00 a.m. and 4:30 p.m.  Voicemails left after 4:00 p.m. will not be returned until the following business day.  For prescription refill requests, have your pharmacy contact our office and allow 72 hours.    Cancer Center Support Programs:   > Cancer Support Group  2nd Tuesday of the month 1pm-2pm, Journey Room

## 2020-06-22 ENCOUNTER — Other Ambulatory Visit (HOSPITAL_COMMUNITY): Payer: Self-pay | Admitting: Nurse Practitioner

## 2020-06-23 ENCOUNTER — Other Ambulatory Visit (HOSPITAL_COMMUNITY): Payer: Self-pay

## 2020-06-23 DIAGNOSIS — F419 Anxiety disorder, unspecified: Secondary | ICD-10-CM

## 2020-06-23 MED ORDER — ALPRAZOLAM 0.5 MG PO TABS: 0.5000 mg | ORAL_TABLET | Freq: Two times a day (BID) | ORAL | 0 refills | Status: DC | PRN

## 2020-06-28 ENCOUNTER — Inpatient Hospital Stay (HOSPITAL_COMMUNITY): Payer: Medicare HMO

## 2020-06-28 ENCOUNTER — Other Ambulatory Visit: Payer: Self-pay

## 2020-06-28 ENCOUNTER — Inpatient Hospital Stay (HOSPITAL_COMMUNITY): Payer: Medicare HMO | Admitting: Hematology

## 2020-06-28 VITALS — BP 130/76 | HR 89 | Temp 97.9°F | Resp 18

## 2020-06-28 VITALS — BP 101/55 | HR 65 | Temp 97.3°F | Resp 18 | Wt 205.9 lb

## 2020-06-28 DIAGNOSIS — C160 Malignant neoplasm of cardia: Secondary | ICD-10-CM

## 2020-06-28 DIAGNOSIS — Z95828 Presence of other vascular implants and grafts: Secondary | ICD-10-CM

## 2020-06-28 DIAGNOSIS — C7951 Secondary malignant neoplasm of bone: Secondary | ICD-10-CM

## 2020-06-28 DIAGNOSIS — R519 Headache, unspecified: Secondary | ICD-10-CM

## 2020-06-28 DIAGNOSIS — Z5111 Encounter for antineoplastic chemotherapy: Secondary | ICD-10-CM | POA: Diagnosis not present

## 2020-06-28 LAB — COMPREHENSIVE METABOLIC PANEL
ALT: 45 U/L — ABNORMAL HIGH (ref 0–44)
AST: 31 U/L (ref 15–41)
Albumin: 3.5 g/dL (ref 3.5–5.0)
Alkaline Phosphatase: 48 U/L (ref 38–126)
Anion gap: 12 (ref 5–15)
BUN: 14 mg/dL (ref 8–23)
CO2: 24 mmol/L (ref 22–32)
Calcium: 9.1 mg/dL (ref 8.9–10.3)
Chloride: 98 mmol/L (ref 98–111)
Creatinine, Ser: 0.7 mg/dL (ref 0.61–1.24)
GFR calc Af Amer: >60 mL/min (ref >60)
GFR calc non Af Amer: >60 mL/min (ref >60)
Glucose, Bld: 170 mg/dL — ABNORMAL HIGH (ref 70–99)
Potassium: 3.8 mmol/L (ref 3.5–5.1)
Sodium: 134 mmol/L — ABNORMAL LOW (ref 135–145)
Total Bilirubin: 0.6 mg/dL (ref 0.3–1.2)
Total Protein: 7 g/dL (ref 6.5–8.1)

## 2020-06-28 LAB — CBC WITH DIFFERENTIAL/PLATELET
Abs Immature Granulocytes: 0.03 10*3/uL (ref 0.00–0.07)
Basophils Absolute: 0.1 10*3/uL (ref 0.0–0.1)
Basophils Relative: 1 %
Eosinophils Absolute: 0.1 10*3/uL (ref 0.0–0.5)
Eosinophils Relative: 1 %
HCT: 34.8 % — ABNORMAL LOW (ref 39.0–52.0)
Hemoglobin: 11.5 g/dL — ABNORMAL LOW (ref 13.0–17.0)
Immature Granulocytes: 0 %
Lymphocytes Relative: 16 %
Lymphs Abs: 1.2 10*3/uL (ref 0.7–4.0)
MCH: 32.5 pg (ref 26.0–34.0)
MCHC: 33 g/dL (ref 30.0–36.0)
MCV: 98.3 fL (ref 80.0–100.0)
Monocytes Absolute: 0.9 10*3/uL (ref 0.1–1.0)
Monocytes Relative: 11 %
Neutro Abs: 5.3 10*3/uL (ref 1.7–7.7)
Neutrophils Relative %: 71 %
Platelets: 260 10*3/uL (ref 150–400)
RBC: 3.54 MIL/uL — ABNORMAL LOW (ref 4.22–5.81)
RDW: 13.4 % (ref 11.5–15.5)
WBC: 7.6 10*3/uL (ref 4.0–10.5)
nRBC: 0 % (ref 0.0–0.2)

## 2020-06-28 LAB — MAGNESIUM: Magnesium: 1.9 mg/dL (ref 1.7–2.4)

## 2020-06-28 LAB — LACTATE DEHYDROGENASE: LDH: 107 U/L (ref 98–192)

## 2020-06-28 MED ORDER — ACETAMINOPHEN 325 MG PO TABS
650.0000 mg | ORAL_TABLET | Freq: Once | ORAL | Status: AC
  Administered 2020-06-28: 650 mg via ORAL
  Filled 2020-06-28: qty 2

## 2020-06-28 MED ORDER — FLUOROURACIL CHEMO INJECTION 2.5 GM/50ML
400.0000 mg/m2 | Freq: Once | INTRAVENOUS | Status: AC
  Administered 2020-06-28: 850 mg via INTRAVENOUS
  Filled 2020-06-28: qty 17

## 2020-06-28 MED ORDER — ZOLEDRONIC ACID 4 MG/100ML IV SOLN
4.0000 mg | Freq: Once | INTRAVENOUS | Status: AC
  Administered 2020-06-28: 4 mg via INTRAVENOUS

## 2020-06-28 MED ORDER — DEXTROSE 5 % IV SOLN: Freq: Once | INTRAVENOUS | Status: AC

## 2020-06-28 MED ORDER — PALONOSETRON HCL INJECTION 0.25 MG/5ML
0.2500 mg | Freq: Once | INTRAVENOUS | Status: AC
  Administered 2020-06-28: 0.25 mg via INTRAVENOUS

## 2020-06-28 MED ORDER — SODIUM CHLORIDE 0.9 % IV SOLN
2400.0000 mg/m2 | INTRAVENOUS | Status: DC
  Administered 2020-06-28: 5200 mg via INTRAVENOUS
  Filled 2020-06-28: qty 104

## 2020-06-28 MED ORDER — LEUCOVORIN CALCIUM INJECTION 350 MG
400.0000 mg/m2 | Freq: Once | INTRAVENOUS | Status: AC
  Administered 2020-06-28: 868 mg via INTRAVENOUS
  Filled 2020-06-28: qty 43.4

## 2020-06-28 MED ORDER — SODIUM CHLORIDE 0.9 % IV SOLN
10.0000 mg | Freq: Once | INTRAVENOUS | Status: AC
  Administered 2020-06-28: 10 mg via INTRAVENOUS
  Filled 2020-06-28: qty 1

## 2020-06-28 MED ORDER — ZOLEDRONIC ACID 4 MG/100ML IV SOLN
INTRAVENOUS | Status: AC
  Filled 2020-06-28: qty 100

## 2020-06-28 MED ORDER — PALONOSETRON HCL INJECTION 0.25 MG/5ML
INTRAVENOUS | Status: AC
  Filled 2020-06-28: qty 5

## 2020-06-28 MED ORDER — OXYCODONE HCL 10 MG PO TABS: 10.0000 mg | ORAL_TABLET | Freq: Every evening | ORAL | 0 refills | Status: AC | PRN

## 2020-06-28 MED ORDER — SODIUM CHLORIDE 0.9 % IV SOLN
240.0000 mg | Freq: Once | INTRAVENOUS | Status: AC
  Administered 2020-06-28: 240 mg via INTRAVENOUS
  Filled 2020-06-28: qty 24

## 2020-06-28 MED ORDER — OXALIPLATIN CHEMO INJECTION 100 MG/20ML
68.0000 mg/m2 | Freq: Once | INTRAVENOUS | Status: AC
  Administered 2020-06-28: 150 mg via INTRAVENOUS
  Filled 2020-06-28: qty 10

## 2020-06-28 MED ORDER — SODIUM CHLORIDE 0.9 % IV SOLN: Freq: Once | INTRAVENOUS | Status: AC

## 2020-06-28 NOTE — Progress Notes (Signed)
Patient presents today for treatment and follow up visit with Dr. Delton Coombes. Vital signs within parameters for treatment. Labs within parameters for treatment. ALT 45. Labs reviewed by Dr. Delton Coombes.   Patient has complaints of pain that he rates a 7/10 in his right and left shoulder in the joints per patient's words. Patient states he has been unable to sleep well.   Message received from [facility] LPN/ Dr. Delton Coombes. Proceed with treatment.   Treatment given today per MD orders. Tolerated infusion without adverse affects. 5FU pump infusing per protocol. Vital signs stable. No complaints at this time. Discharged from clinic ambulatory. F/U with [facility] as scheduled.

## 2020-06-28 NOTE — Patient Instructions (Addendum)
[place] at [facility] Discharge Instructions  You were seen today by Dr. Delton Coombes. He went over your recent results. He is giving you oxycodone for your shoulder pain, which you can take at bedtime to help you sleep. We are referring you out to an ENT with [facility]; they will be in contact with you with an appointment. Dr. Delton Coombes will see you back in for labs and follow up.   Thank you for choosing [place] at [facility] to provide your oncology and hematology care.  To afford each patient quality time with our provider, please arrive at least 15 minutes before your scheduled appointment time.   If you have a lab appointment with the [place] please come in thru the Main Entrance and check in at the main information desk  You need to re-schedule your appointment should you arrive 10 or more minutes late.  We strive to give you quality time with our providers, and arriving late affects you and other patients whose appointments are after yours.  Also, if you no show three or more times for appointments you may be dismissed from the clinic at the providers discretion.     Again, thank you for choosing [facility].  Our hope is that these requests will decrease the amount of time that you wait before being seen by our physicians.       _____________________________________________________________  Should you have questions after your visit to [facility], please contact our office at [phone] between the hours of 8:00 a.m. and 4:30 p.m.  Voicemails left after 4:00 p.m. will not be returned until the following business day.  For prescription refill requests, have your pharmacy contact our office and allow 72 hours.    Cancer Center Support Programs:   > Cancer Support Group  2nd Tuesday of the month 1pm-2pm, Journey Room

## 2020-06-28 NOTE — Progress Notes (Signed)
[place], [place] [address]   CLINIC:  Medical Oncology/Hematology  PCP:  [name], MD [address] 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[place])   12/29/2019 Cancer Staging   Staging form: Esophagus - Adenocarcinoma, AJCC 8th Edition - Clinical stage from 12/29/2019: Stage IVB (cTX, cN3, cM1) - Signed by Derek Jack, MD on 12/29/2019   12/31/2019 -  Chemotherapy   The patient had palonosetron (ALOXI) injection 0.25 mg, 0.25 mg, Intravenous,  Once, 11 of 12 cycles Administration: 0.25 mg (12/31/2019), 0.25 mg (01/14/2020), 0.25 mg (01/28/2020), 0.25 mg (02/11/2020), 0.25 mg (02/25/2020), 0.25 mg (03/10/2020), 0.25 mg (03/24/2020), 0.25 mg (04/12/2020), 0.25 mg (04/26/2020), 0.25 mg (05/10/2020), 0.25 mg (05/24/2020) leucovorin 868 mg in dextrose 5 % 250 mL infusion, 400 mg/m2 = 868 mg, Intravenous,  Once, 11 of 12 cycles Administration: 868 mg (12/31/2019), 868 mg (01/14/2020), 868 mg (01/28/2020), 868 mg (02/11/2020), 868 mg (02/25/2020), 868 mg (03/10/2020), 868 mg (03/24/2020), 868 mg (04/12/2020), 868 mg (04/26/2020), 868 mg (05/10/2020), 868 mg (05/24/2020) oxaliplatin (ELOXATIN) 150 mg in dextrose 5 % 500 mL chemo infusion, 68 mg/m2 = 150 mg (80 % of original dose 85 mg/m2), Intravenous,  Once, 11 of 12 cycles Dose modification: 68 mg/m2 (80 % of original dose 85 mg/m2, Cycle 1, Reason: Other (see comments), Comment: diabetic neuropathy) Administration: 150 mg (12/31/2019), 150 mg (01/14/2020), 150 mg (01/28/2020), 150 mg (02/11/2020), 150 mg (02/25/2020), 150 mg (03/10/2020), 150 mg  (03/24/2020), 150 mg (04/12/2020), 150 mg (04/26/2020), 150 mg (05/10/2020), 150 mg (05/24/2020) fluorouracil (ADRUCIL) chemo injection 850 mg, 400 mg/m2 = 850 mg, Intravenous,  Once, 11 of 12 cycles Administration: 850 mg (12/31/2019), 850 mg (01/14/2020), 850 mg (01/28/2020), 850 mg (02/11/2020), 850 mg (02/25/2020), 850 mg (03/10/2020), 850 mg (03/24/2020), 850 mg (04/12/2020), 850 mg (04/26/2020), 850 mg (05/10/2020), 850 mg (05/24/2020) fluorouracil (ADRUCIL) 5,000 mg in sodium chloride 0.9 % 150 mL chemo infusion, 2,310 mg/m2 = 5,200 mg, Intravenous, 1 Day/Dose, 11 of 12 cycles Administration: 5,000 mg (12/31/2019), 5,000 mg (01/14/2020), 5,000 mg (01/28/2020), 5,000 mg (02/11/2020), 5,000 mg (02/25/2020), 5,000 mg (03/10/2020), 5,000 mg (03/24/2020), 5,000 mg (04/12/2020), 5,000 mg (04/26/2020), 5,000 mg (05/10/2020), 5,000 mg (05/24/2020) nivolumab (OPDIVO) 240 mg in sodium chloride 0.9 % 100 mL chemo infusion, 240 mg (100 % of original dose 240 mg), Intravenous, Once, 11 of 12 cycles Dose modification: 240 mg (original dose 240 mg, Cycle 1, Reason: Provider Judgment) Administration: 240 mg (12/31/2019), 240 mg (01/14/2020), 240 mg (01/28/2020), 240 mg (02/11/2020), 240 mg (02/25/2020), 240 mg (03/10/2020), 240 mg (03/24/2020), 240 mg (04/12/2020), 240 mg (04/26/2020), 240 mg (05/10/2020), 240 mg (05/24/2020)  for chemotherapy treatment.      CANCER STAGING: Cancer Staging Adenocarcinoma of gastroesophageal junction [facility]) Staging form: Esophagus - Adenocarcinoma, AJCC 8th Edition - Clinical stage from 12/29/2019: Stage IVB (cTX, cN3, cM1) - Signed by Derek Jack, MD on 12/29/2019   INTERVAL HISTORY:  Mr. Papa [place], a 69 y.o. male, returns for routine follow-up and consideration for next cycle of chemotherapy. Dejuan was last seen on 06/26/2020.  Due for cycle #12 of FOLFOX & Aloxi today.  Overall, he tells me he has not been feeling very well. He notes that his voice has gotten significantly gotten more raspy. He  also notes that his right shoulder has began to cause him severe pain, particularly when he lays down. He states that he his is sitting upright, he isn't sore, but when he lays down, he has significant pain from his joint shooting down across his back and up his neck. He does note that he has had some muscle tightness in the past, but nothing like this; the pain medications that he has aren't touching the pain he has. He has been taking Oprazalam and hydrocodone without relief.   He does note some difficulty swallowing on occasion. He states that since he has stopped hid treatments, he has noticed this much more. He will spit up on occasion, but food rarely has difficulty going down. He does note a clear discharge from his nose. He denies  Overall, he feels ready for next cycle of chemo today.    REVIEW OF SYSTEMS:  Review of Systems  Constitutional: Negative.   HENT:   Positive for trouble swallowing and voice change.   Eyes: Negative.   Respiratory: Negative.   Cardiovascular: Negative.   Gastrointestinal: Negative.   Endocrine: Negative.   Genitourinary: Negative.    Musculoskeletal: Negative.   Skin: Negative.   Neurological: Positive for headaches and numbness (neuropathy; feet).  Hematological: Negative.   Psychiatric/Behavioral: The patient is nervous/anxious.   All other systems reviewed and are negative.   PAST MEDICAL/SURGICAL HISTORY:  Past Medical History:  Diagnosis Date  . 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 Anxiety   . Diabetes ([place])   . Heart attack ([place])   . Heart disease   . High cholesterol   . Hyperplasia of prostate   . Hypertension   . Low back pain   . Port-A-Cath in place 12/30/2019  . Tremor    Past Surgical History:  Procedure Laterality Date  . arterectomy     1993  . BIOPSY  12/01/2019   Procedure: BIOPSY;  Surgeon: Rogene Houston, MD;  Location: [facility];  Service: Endoscopy;;  esophagus  . ESOPHAGOGASTRODUODENOSCOPY  (EGD) WITH PROPOFOL N/A 12/01/2019   Procedure: ESOPHAGOGASTRODUODENOSCOPY (EGD) WITH PROPOFOL;  Surgeon: Rogene Houston, MD;  Location: [facility];  Service: Endoscopy;  Laterality: N/A;  . ESOPHAGOGASTRODUODENOSCOPY (EGD) WITH PROPOFOL N/A 12/24/2019   Procedure: ESOPHAGOGASTRODUODENOSCOPY (EGD) WITH PROPOFOL;  Surgeon: Aviva Signs, MD;  Location: [facility] ORS;  Service: General;  Laterality: N/A;  . Heart stents     2002, 2004, 2006  . PEG PLACEMENT N/A 12/24/2019   Procedure: PERCUTANEOUS ENDOSCOPIC GASTROSTOMY (PEG) PLACEMENT;  Surgeon: Aviva Signs, MD;  Location: [facility] ORS;  Service: General;  Laterality: N/A;  . PORTACATH PLACEMENT Left 12/24/2019   Procedure: INSERTION PORT-A-CATH;  Surgeon: Aviva Signs, MD;  Location: [facility] OR[facility];  Service: General;  Laterality: Left;    SOCIAL HISTORY:  Social History   Socioeconomic History  . Marital status: Married    Spouse name: Not on file  . Number of children: 2  . Years of education: 4  . Highest education level: Not on file  Occupational History  . Occupation: [employer]    Comment: Delivers medication  Tobacco Use  . Smoking status: Former Smoker    Packs/day: 1.00    Years: 50.00    Pack years: 50.00    Quit date: 12/21/2017    Years since quitting: 2.5  .  Smokeless tobacco: Never Used  . Tobacco comment: Quit 2017  Vaping Use  . Vaping Use: Never used  Substance and Sexual Activity  . Alcohol use: Yes    Alcohol/week: 0.0 standard drinks    Comment: 3 drinks per week  . Drug use: No  . Sexual activity: Yes  Other Topics Concern  . Not on file  Social History Narrative   Lives at home with his wife.   Right-handed.   2 diet cokes per day.   Social Determinants of Health   Financial Resource Strain: Low Risk   . Difficulty of Paying Living Expenses: Not hard at all  Food Insecurity: No Food Insecurity  . Worried About Charity fundraiser in the Last Year: Never true  . Ran Out of Food in the Last Year: Never true    Transportation Needs: No Transportation Needs  . Lack of Transportation (Medical): No  . Lack of Transportation (Non-Medical): No  Physical Activity: Insufficiently Active  . Days of Exercise per Week: 2 days  . Minutes of Exercise per Session: 30 min  Stress: No Stress Concern Present  . Feeling of Stress : Only a little  Social Connections: Socially Integrated  . Frequency of Communication with Friends and Family: More than three times a week  . Frequency of Social Gatherings with Friends and Family: More than three times a week  . Attends Religious Services: 1 to 4 times per year  . Active Member of Clubs or Organizations: No  . Attends Archivist Meetings: 1 to 4 times per year  . Marital Status: Married  Human resources officer Violence: Not At Risk  . Fear of Current or Ex-Partner: No  . Emotionally Abused: No  . Physically Abused: No  . Sexually Abused: No    FAMILY HISTORY:  Family History  Problem Relation Age of Onset  . Alzheimer's disease Mother   . Diabetes Father   . Heart disease Father   . Stroke Father   . Heart disease Brother   . Colon cancer Brother   . Alzheimer's disease Sister     CURRENT MEDICATIONS:  Current Outpatient Medications  Medication Sig Dispense Refill  . ALPRAZolam (XANAX) 0.5 MG tablet Take 1 tablet (0.5 mg total) by mouth 2 (two) times daily as needed for anxiety. 30 tablet 0  . ascorbic acid (VITAMIN C) 500 MG tablet Take 500 mg by mouth 2 (two) times a week.     Marland Kitchen aspirin EC 81 MG tablet Take 81 mg by mouth daily.     Marland Kitchen atorvastatin (LIPITOR) 80 MG tablet Take 80 mg by mouth every evening.     . calcium-vitamin D (OSCAL WITH D) 500-200 MG-UNIT tablet Take 2 tablets by mouth 2 (two) times daily.    Marland Kitchen co-enzyme Q-10 30 MG capsule Take 30 mg by mouth daily.    Marland Kitchen ezetimibe (ZETIA) 10 MG tablet Take 10 mg by mouth daily.     Marland Kitchen FLUOROURACIL IV Inject into the vein every 14 (fourteen) days.    . folic acid (FOLVITE) 098 MCG tablet  Take 800 mcg by mouth daily.     Marland Kitchen HYDROcodone-acetaminophen (HYCET) 7.5-325 mg/15 ml solution Take 15 mLs by mouth every 4 (four) hours as needed for moderate pain. 120 mL 0  . isosorbide mononitrate (IMDUR) 60 MG 24 hr tablet Take 60 mg by mouth daily.    Marland Kitchen LEUCOVORIN CALCIUM IV Inject into the vein every 14 (fourteen) days.    Marland Kitchen losartan (  COZAAR) 50 MG tablet Take 25 mg by mouth daily.    . metFORMIN (GLUCOPHAGE-XR) 500 MG 24 hr tablet Take 2,000 mg by mouth every morning.    . metoprolol tartrate (LOPRESSOR) 25 MG tablet Take 0.5 tablets (12.5 mg total) by mouth 2 (two) times daily. (Patient taking differently: Take 25 mg by mouth 2 (two) times daily. ) 60 tablet 1  . Multiple Vitamin (MULTI-VITAMINS) TABS Take 1 tablet by mouth daily.     Marland Kitchen NIVOLUMAB IV Inject into the vein every 14 (fourteen) days.    . OXALIPLATIN IV Inject into the vein every 14 (fourteen) days.    . tamsulosin (FLOMAX) 0.4 MG CAPS capsule Take 0.4 mg by mouth daily.     Marland Kitchen lidocaine-prilocaine (EMLA) cream Apply a dime-sized amount to port a cath site and cover with plastic wrap 1 hour prior to chemotherapy appointments (Patient not taking: Reported on 06/28/2020) 30 g 3  . prochlorperazine (COMPAZINE) 10 MG tablet Take 1 tablet (10 mg total) by mouth every 6 (six) hours as needed (Nausea or vomiting). (Patient not taking: Reported on 06/28/2020) 30 tablet 1   No current facility-administered medications for this visit.    ALLERGIES:  No Known Allergies  PHYSICAL EXAM:  Performance status (ECOG): 1 - Symptomatic but completely ambulatory  Vitals:   06/28/20 0817  BP: (!) 101/55  Pulse: 65  Resp: 18  Temp: (!) 97.3 F (36.3 C)  SpO2: 97%   Wt Readings from Last 3 Encounters:  06/28/20 (!) 205 lb 14.4 oz (93.4 kg)  06/21/20 206 lb 8 oz (93.7 kg)  06/14/20 211 lb (95.7 kg)   Physical Exam Vitals reviewed.  Constitutional:      Appearance: Normal appearance.  HENT:     Mouth/Throat:     Mouth: Mucous  membranes are moist.  Eyes:     Pupils: Pupils are equal, round, and reactive to light.  Cardiovascular:     Rate and Rhythm: Normal rate and regular rhythm.     Pulses: Normal pulses.     Heart sounds: Normal heart sounds.  Pulmonary:     Effort: Pulmonary effort is normal.     Breath sounds: Normal breath sounds.  Abdominal:     Palpations: Abdomen is soft. There is no mass.     Tenderness: There is no abdominal tenderness.  Musculoskeletal:     Right lower leg: No edema.     Left lower leg: No edema.  Neurological:     Mental Status: He is alert and oriented to person, place, and time.  Psychiatric:        Mood and Affect: Mood normal.        Behavior: Behavior normal.     LABORATORY DATA:  I have reviewed the labs as listed.  CBC Latest Ref Rng & Units 06/28/2020 06/21/2020 06/14/2020  WBC 4.0 - 10.5 K/uL 7.6 7.3 2.9(L)  Hemoglobin 13.0 - 17.0 g/dL 11.5(L) 12.3(L) 11.3(L)  Hematocrit 39 - 52 % 34.8(L) 37.6(L) 34.9(L)  Platelets 150 - 400 K/uL 260 269 267   CMP Latest Ref Rng & Units 06/21/2020 06/14/2020 06/08/2020  Glucose 70 - 99 mg/dL 150(H) 230(H) 161(H)  BUN 8 - 23 mg/dL _0 Creatinine 0.61 - 1.24 mg/dL 0.80 0.74 0.68  Sodium 135 - 145 mmol/L 136 138 140  Potassium 3.5 - 5.1 mmol/L 4.5 3.8 3.7  Chloride 98 - 111 mmol/L 99 104 106  CO2 22 - 32 mmol/L _1 Calcium  8.9 - 10.3 mg/dL 9.5 9.3 9.0  Total Protein 6.5 - 8.1 g/dL 7.2 6.4(L) 6.4(L)  Total Bilirubin 0.3 - 1.2 mg/dL 0.3 0.5 0.5  Alkaline Phos 38 - 126 U/L 46 42 45  AST 15 - 41 U/L _0 ALT 0 - 44 U/L _1 DIAGNOSTIC IMAGING:  I have independently reviewed the scans and discussed with the patient. CT CHEST W CONTRAST  Result Date: 06/10/2020 CLINICAL DATA:  Adenocarcinoma at the gastroesophageal junction. EXAM: CT CHEST, ABDOMEN, AND PELVIS WITH CONTRAST TECHNIQUE: Multidetector CT imaging of the chest, abdomen and pelvis was performed following the standard protocol during bolus  administration of intravenous contrast. CONTRAST:  155m OMNIPAQUE IOHEXOL 300 MG/ML  SOLN COMPARISON:  PET-CT 03/22/2020.  Chest CT 11/30/2019 FINDINGS: CT CHEST FINDINGS Cardiovascular: Severe three-vessel coronary artery atherosclerosis with lesser involvement of the aorta and great vessels. No acute vascular findings. Left subclavian Port-A-Cath extends to the mid SVC. The heart size is normal. There is no pericardial effusion. Mediastinum/Nodes: 13 mm subcarinal node on image 34/2 is improved from the baseline chest CT and similar in size to the most recent PET-CT. No other enlarged mediastinal, hilar or axillary lymph nodes. Mild wall thickening of the distal esophagus without residual or recurrent mass lesion. The trachea and thyroid gland appear normal. Lungs/Pleura: There is no pleural effusion. The tree-in-bud nodularity seen on the baseline chest CT has resolved. 4 mm right lower lobe nodule on image 92/3 is unchanged. No new or enlarging pulmonary nodules. Musculoskeletal/Chest wall: No chest wall mass or suspicious osseous findings. CT ABDOMEN AND PELVIS FINDINGS Hepatobiliary: The liver is normal in density without suspicious focal abnormality. No evidence of gallstones, gallbladder wall thickening or biliary dilatation. Pancreas: Unremarkable. No pancreatic ductal dilatation or surrounding inflammatory changes. Spleen: Normal in size without focal abnormality. Adrenals/Urinary Tract: Both adrenal glands appear normal. Stable left renal cysts. No evidence of renal mass, urinary tract calculus or hydronephrosis. Stable mild generalized bladder wall thickening without focal abnormality. Stomach/Bowel: No evidence of bowel wall thickening, distention or surrounding inflammatory change. Percutaneous G-tube appears unchanged. Mild diverticular changes within the sigmoid colon. Vascular/Lymphatic: There are no enlarged abdominal or pelvic lymph nodes. Small residual lymph nodes within the gastrohepatic  ligament and porta hepatis are unchanged from most recent PET-CT. Diffuse aortic and branch vessel atherosclerosis without acute vascular findings. The portal, superior mesenteric and splenic veins are patent. Reproductive: Stable marked enlargement of the prostate gland. Other: Stable small umbilical hernia containing only fat. No ascites or peritoneal nodularity. Musculoskeletal: No acute or significant osseous findings. Multilevel lumbar spondylosis. IMPRESSION: 1. Interval resolution of previously demonstrated tree-in-bud nodularity in the lungs, consistent with a resolved inflammatory process. No evidence of local recurrence or metastatic disease. 2. Stable mildly enlarged subcarinal lymph node and small residual lymph nodes within the gastrohepatic ligament and porta hepatis, unchanged from most recent PET-CT. 3. Aortic Atherosclerosis (ICD10-I70.0). 4. Prostatomegaly. Electronically Signed   By: WRichardean SaleM.D.   On: 06/10/2020 10:52   CT ABDOMEN PELVIS W CONTRAST  Result Date: 06/10/2020 CLINICAL DATA:  Adenocarcinoma at the gastroesophageal junction. EXAM: CT CHEST, ABDOMEN, AND PELVIS WITH CONTRAST TECHNIQUE: Multidetector CT imaging of the chest, abdomen and pelvis was performed following the standard protocol during bolus administration of intravenous contrast. CONTRAST:  1033mOMNIPAQUE IOHEXOL 300 MG/ML  SOLN COMPARISON:  PET-CT 03/22/2020.  Chest CT 11/30/2019 FINDINGS: CT CHEST FINDINGS Cardiovascular: Severe three-vessel coronary artery atherosclerosis with lesser involvement of the aorta  and great vessels. No acute vascular findings. Left subclavian Port-A-Cath extends to the mid SVC. The heart size is normal. There is no pericardial effusion. Mediastinum/Nodes: 13 mm subcarinal node on image 34/2 is improved from the baseline chest CT and similar in size to the most recent PET-CT. No other enlarged mediastinal, hilar or axillary lymph nodes. Mild wall thickening of the distal esophagus  without residual or recurrent mass lesion. The trachea and thyroid gland appear normal. Lungs/Pleura: There is no pleural effusion. The tree-in-bud nodularity seen on the baseline chest CT has resolved. 4 mm right lower lobe nodule on image 92/3 is unchanged. No new or enlarging pulmonary nodules. Musculoskeletal/Chest wall: No chest wall mass or suspicious osseous findings. CT ABDOMEN AND PELVIS FINDINGS Hepatobiliary: The liver is normal in density without suspicious focal abnormality. No evidence of gallstones, gallbladder wall thickening or biliary dilatation. Pancreas: Unremarkable. No pancreatic ductal dilatation or surrounding inflammatory changes. Spleen: Normal in size without focal abnormality. Adrenals/Urinary Tract: Both adrenal glands appear normal. Stable left renal cysts. No evidence of renal mass, urinary tract calculus or hydronephrosis. Stable mild generalized bladder wall thickening without focal abnormality. Stomach/Bowel: No evidence of bowel wall thickening, distention or surrounding inflammatory change. Percutaneous G-tube appears unchanged. Mild diverticular changes within the sigmoid colon. Vascular/Lymphatic: There are no enlarged abdominal or pelvic lymph nodes. Small residual lymph nodes within the gastrohepatic ligament and porta hepatis are unchanged from most recent PET-CT. Diffuse aortic and branch vessel atherosclerosis without acute vascular findings. The portal, superior mesenteric and splenic veins are patent. Reproductive: Stable marked enlargement of the prostate gland. Other: Stable small umbilical hernia containing only fat. No ascites or peritoneal nodularity. Musculoskeletal: No acute or significant osseous findings. Multilevel lumbar spondylosis. IMPRESSION: 1. Interval resolution of previously demonstrated tree-in-bud nodularity in the lungs, consistent with a resolved inflammatory process. No evidence of local recurrence or metastatic disease. 2. Stable mildly enlarged  subcarinal lymph node and small residual lymph nodes within the gastrohepatic ligament and porta hepatis, unchanged from most recent PET-CT. 3. Aortic Atherosclerosis (ICD10-I70.0). 4. Prostatomegaly. Electronically Signed   By: Richardean Sale M.D.   On: 06/10/2020 10:52     ASSESSMENT:  1. 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 HER-2 FISH to be followed up. PD-L1 CPS-5%. -FOLFOX and Opdivo started on 12/31/2019. -PET scan on 03/22/2020 showed marked reduction in size and metabolic activity in the distal esophageal lesion, marked reduction in size of mediastinal adenopathy. Near complete resolution with only 2 small mildly metabolic mediastinal lymph nodes remaining. Resolution of lymph nodes in the upper abdomen. Right hip bone lesion is still present but better. -CT CAP on 06/09/2020 showed interval resolution of previously demonstrated tree-in-bud nodularity in the lungs. Stable enlarged subcarinal lymph node and small residual lymph nodes with the gastrohepatic ligament and porta hepatis unchanged from the most recent PET scan.   PLAN:  1.  Stage IV GE junction adenocarcinoma: -His insurance has denied PET scan. -I have reviewed his labs today which are adequate to proceed with his next cycle of treatment.  2.  Hoarseness of voice: -He lost his voice about 3 weeks ago and it has not improved.  He had intermittent loss of voice in the past but it came back within a few days. -I will make a referral to ENT for direct visualization of vocal cords.  3.  Nutrition: -He has not used PEG tube in the last several months.  He is able to eat by mouth.  4.  Bone metastasis: -Continue Zometa monthly.  5.  Diabetes: -Continue Metformin.  6.  Hypertension: -Continue Lopressor 25 mg daily.  7. Anxiety: -Continue Xanax 0.5 mg as needed.  8.  Shoulder pains: -He reported having bilateral shoulder pains in the last 1 week.  He has taken hydrocodone  which did not help.  He also use Salonpas. -I will start him on oxycodone 10 mg at bedtime as his pains are most prominent at nighttime.   Orders placed this encounter:  No orders of the defined types were placed in this encounter.    Derek Jack, MD [facility] [phone]   I, Jacqualyn Posey, am acting as a scribe for Dr. Sanda Linger.  I, Derek Jack MD, have reviewed the above documentation for accuracy and completeness, and I agree with the above.

## 2020-06-28 NOTE — Progress Notes (Signed)
Patient has been assessed, vital signs and labs have been reviewed by Dr. Katragadda. ANC, Creatinine, LFTs, and Platelets are within treatment parameters per Dr. Katragadda. The patient is good to proceed with treatment at this time.  

## 2020-06-28 NOTE — Patient Instructions (Signed)
[facility] Discharge Instructions  Labs drawn from portacath today   Thank you for choosing  Cancer Center at [facility] to provide your oncology and hematology care.  To afford each patient quality time with our provider, please arrive at least 15 minutes before your scheduled appointment time.   If you have a lab appointment with the Cancer Center please come in thru the Main Entrance and check in at the main information desk.  You need to re-schedule your appointment should you arrive 10 or more minutes late.  We strive to give you quality time with our providers, and arriving late affects you and other patients whose appointments are after yours.  Also, if you no show three or more times for appointments you may be dismissed from the clinic at the providers discretion.     Again, thank you for choosing [facility].  Our hope is that these requests will decrease the amount of time that you wait before being seen by our physicians.       _____________________________________________________________  Should you have questions after your visit to [facility], please contact our office at [phone] and follow the prompts.  Our office hours are 8:00 a.m. and 4:30 p.m. Monday - Friday.  Please note that voicemails left after 4:00 p.m. may not be returned until the following business day.  We are closed weekends and major holidays.  You do have access to a nurse 24-7, just call the main number to the clinic [phone] and do not press any options, hold on the line and a nurse will answer the phone.    For prescription refill requests, have your pharmacy contact our office and allow 72 hours.    Due to Covid, you will need to wear a mask upon entering the hospital. If you do not have a mask, a mask will be given to you at the Main Entrance upon arrival. For doctor visits, patients may have 1 support person age 18  or older with them. For treatment visits, patients can not have anyone with them due to social distancing guidelines and our immunocompromised population.     

## 2020-06-28 NOTE — Patient Instructions (Signed)
[facility] Discharge Instructions for Patients Receiving Chemotherapy  Today you received the following chemotherapy agents   To help prevent nausea and vomiting after your treatment, we encourage you to take your nausea medication   If you develop nausea and vomiting that is not controlled by your nausea medication, call the clinic.   BELOW ARE SYMPTOMS THAT SHOULD BE REPORTED IMMEDIATELY:  *FEVER GREATER THAN 100.5 F  *CHILLS WITH OR WITHOUT FEVER  NAUSEA AND VOMITING THAT IS NOT CONTROLLED WITH YOUR NAUSEA MEDICATION  *UNUSUAL SHORTNESS OF BREATH  *UNUSUAL BRUISING OR BLEEDING  TENDERNESS IN MOUTH AND THROAT WITH OR WITHOUT PRESENCE OF ULCERS  *URINARY PROBLEMS  *BOWEL PROBLEMS  UNUSUAL RASH Items with * indicate a potential emergency and should be followed up as soon as possible.  Feel free to call the clinic should you have any questions or concerns. The clinic phone number is [phone].  Please show the CHEMO ALERT CARD at check-in to the Emergency Department and triage nurse.   

## 2020-06-29 LAB — CEA: CEA: 3.1 ng/mL (ref 0.0–4.7)

## 2020-06-30 ENCOUNTER — Inpatient Hospital Stay (HOSPITAL_COMMUNITY): Payer: Medicare HMO

## 2020-06-30 VITALS — BP 104/52 | HR 78 | Temp 97.7°F | Resp 18

## 2020-06-30 DIAGNOSIS — Z95828 Presence of other vascular implants and grafts: Secondary | ICD-10-CM

## 2020-06-30 DIAGNOSIS — Z5111 Encounter for antineoplastic chemotherapy: Secondary | ICD-10-CM | POA: Diagnosis not present

## 2020-06-30 DIAGNOSIS — C160 Malignant neoplasm of cardia: Secondary | ICD-10-CM

## 2020-06-30 MED ORDER — SODIUM CHLORIDE 0.9% FLUSH
10.0000 mL | INTRAVENOUS | Status: DC | PRN
  Administered 2020-06-30: 10 mL

## 2020-06-30 MED ORDER — HEPARIN SOD (PORK) LOCK FLUSH 100 UNIT/ML IV SOLN
500.0000 [IU] | Freq: Once | INTRAVENOUS | Status: AC | PRN
  Administered 2020-06-30: 500 [IU]

## 2020-07-12 ENCOUNTER — Inpatient Hospital Stay (HOSPITAL_COMMUNITY): Payer: Medicare HMO | Admitting: Hematology

## 2020-07-12 ENCOUNTER — Inpatient Hospital Stay (HOSPITAL_COMMUNITY): Payer: Medicare HMO

## 2020-07-12 ENCOUNTER — Inpatient Hospital Stay (HOSPITAL_COMMUNITY): Payer: Medicare HMO | Attending: Hematology

## 2020-07-12 ENCOUNTER — Other Ambulatory Visit: Payer: Self-pay

## 2020-07-12 VITALS — BP 102/53 | HR 75 | Temp 97.7°F | Resp 18 | Wt 202.1 lb

## 2020-07-12 VITALS — BP 152/78 | HR 88 | Temp 97.8°F | Resp 16

## 2020-07-12 DIAGNOSIS — C160 Malignant neoplasm of cardia: Secondary | ICD-10-CM

## 2020-07-12 DIAGNOSIS — C7951 Secondary malignant neoplasm of bone: Secondary | ICD-10-CM | POA: Diagnosis not present

## 2020-07-12 DIAGNOSIS — Z79899 Other long term (current) drug therapy: Secondary | ICD-10-CM | POA: Diagnosis not present

## 2020-07-12 DIAGNOSIS — R6889 Other general symptoms and signs: Secondary | ICD-10-CM | POA: Diagnosis not present

## 2020-07-12 DIAGNOSIS — Z5112 Encounter for antineoplastic immunotherapy: Secondary | ICD-10-CM | POA: Insufficient documentation

## 2020-07-12 DIAGNOSIS — R490 Dysphonia: Secondary | ICD-10-CM | POA: Diagnosis not present

## 2020-07-12 DIAGNOSIS — E119 Type 2 diabetes mellitus without complications: Secondary | ICD-10-CM | POA: Insufficient documentation

## 2020-07-12 DIAGNOSIS — Z800 Family history of malignant neoplasm of digestive organs: Secondary | ICD-10-CM | POA: Diagnosis not present

## 2020-07-12 DIAGNOSIS — J3801 Paralysis of vocal cords and larynx, unilateral: Secondary | ICD-10-CM | POA: Diagnosis not present

## 2020-07-12 DIAGNOSIS — I10 Essential (primary) hypertension: Secondary | ICD-10-CM | POA: Diagnosis not present

## 2020-07-12 DIAGNOSIS — Z5111 Encounter for antineoplastic chemotherapy: Secondary | ICD-10-CM | POA: Diagnosis not present

## 2020-07-12 DIAGNOSIS — F419 Anxiety disorder, unspecified: Secondary | ICD-10-CM | POA: Diagnosis not present

## 2020-07-12 DIAGNOSIS — Z95828 Presence of other vascular implants and grafts: Secondary | ICD-10-CM

## 2020-07-12 LAB — COMPREHENSIVE METABOLIC PANEL
ALT: 30 U/L (ref 0–44)
AST: 26 U/L (ref 15–41)
Albumin: 3.2 g/dL — ABNORMAL LOW (ref 3.5–5.0)
Alkaline Phosphatase: 56 U/L (ref 38–126)
Anion gap: 9 (ref 5–15)
BUN: 15 mg/dL (ref 8–23)
CO2: 27 mmol/L (ref 22–32)
Calcium: 8.9 mg/dL (ref 8.9–10.3)
Chloride: 99 mmol/L (ref 98–111)
Creatinine, Ser: 0.81 mg/dL (ref 0.61–1.24)
GFR calc Af Amer: >60 mL/min (ref >60)
GFR calc non Af Amer: >60 mL/min (ref >60)
Glucose, Bld: 228 mg/dL — ABNORMAL HIGH (ref 70–99)
Potassium: 3.9 mmol/L (ref 3.5–5.1)
Sodium: 135 mmol/L (ref 135–145)
Total Bilirubin: 0.4 mg/dL (ref 0.3–1.2)
Total Protein: 6.8 g/dL (ref 6.5–8.1)

## 2020-07-12 LAB — CBC WITH DIFFERENTIAL/PLATELET
Abs Immature Granulocytes: 0.02 10*3/uL (ref 0.00–0.07)
Basophils Absolute: 0 10*3/uL (ref 0.0–0.1)
Basophils Relative: 0 %
Eosinophils Absolute: 0 10*3/uL (ref 0.0–0.5)
Eosinophils Relative: 0 %
HCT: 31.3 % — ABNORMAL LOW (ref 39.0–52.0)
Hemoglobin: 10.2 g/dL — ABNORMAL LOW (ref 13.0–17.0)
Lymphocytes Relative: 26 %
Lymphs Abs: 1.6 10*3/uL (ref 0.7–4.0)
MCH: 31.8 pg (ref 26.0–34.0)
MCHC: 32.6 g/dL (ref 30.0–36.0)
MCV: 97.5 fL (ref 80.0–100.0)
Monocytes Absolute: 0.6 10*3/uL (ref 0.1–1.0)
Monocytes Relative: 10 %
Neutro Abs: 3.9 10*3/uL (ref 1.7–7.7)
Neutrophils Relative %: 64 %
Platelets: 265 10*3/uL (ref 150–400)
RBC: 3.21 MIL/uL — ABNORMAL LOW (ref 4.22–5.81)
RDW: 13.6 % (ref 11.5–15.5)
WBC: 6.1 10*3/uL (ref 4.0–10.5)
nRBC: 0 % (ref 0.0–0.2)

## 2020-07-12 LAB — MAGNESIUM: Magnesium: 1.9 mg/dL (ref 1.7–2.4)

## 2020-07-12 MED ORDER — HEPARIN SOD (PORK) LOCK FLUSH 100 UNIT/ML IV SOLN
500.0000 [IU] | Freq: Once | INTRAVENOUS | Status: AC | PRN
  Administered 2020-07-12: 500 [IU]

## 2020-07-12 MED ORDER — FLUOROURACIL CHEMO INJECTION 2.5 GM/50ML
400.0000 mg/m2 | Freq: Once | INTRAVENOUS | Status: DC
  Filled 2020-07-12: qty 17

## 2020-07-12 MED ORDER — DEXTROSE 5 % IV SOLN: Freq: Once | INTRAVENOUS | Status: AC

## 2020-07-12 MED ORDER — SODIUM CHLORIDE 0.9 % IV SOLN: Freq: Once | INTRAVENOUS | Status: AC

## 2020-07-12 MED ORDER — OXALIPLATIN CHEMO INJECTION 100 MG/20ML
68.0000 mg/m2 | Freq: Once | INTRAVENOUS | Status: AC
  Administered 2020-07-12: 150 mg via INTRAVENOUS
  Filled 2020-07-12: qty 20

## 2020-07-12 MED ORDER — MEPERIDINE HCL 50 MG/ML IJ SOLN
INTRAMUSCULAR | Status: AC
  Filled 2020-07-12: qty 1

## 2020-07-12 MED ORDER — PALONOSETRON HCL INJECTION 0.25 MG/5ML
0.2500 mg | Freq: Once | INTRAVENOUS | Status: AC
  Administered 2020-07-12: 0.25 mg via INTRAVENOUS
  Filled 2020-07-12: qty 5

## 2020-07-12 MED ORDER — SODIUM CHLORIDE 0.9 % IV SOLN
240.0000 mg | Freq: Once | INTRAVENOUS | Status: AC
  Administered 2020-07-12: 240 mg via INTRAVENOUS
  Filled 2020-07-12: qty 4

## 2020-07-12 MED ORDER — SODIUM CHLORIDE 0.9% FLUSH
10.0000 mL | INTRAVENOUS | Status: DC | PRN
  Administered 2020-07-12 (×2): 10 mL

## 2020-07-12 MED ORDER — MEPERIDINE HCL 50 MG/ML IJ SOLN
25.0000 mg | Freq: Once | INTRAMUSCULAR | Status: AC
  Administered 2020-07-12: 25 mg via INTRAVENOUS

## 2020-07-12 MED ORDER — SODIUM CHLORIDE 0.9 % IV SOLN
2400.0000 mg/m2 | INTRAVENOUS | Status: DC
  Filled 2020-07-12: qty 104

## 2020-07-12 MED ORDER — LEUCOVORIN CALCIUM INJECTION 350 MG
400.0000 mg/m2 | Freq: Once | INTRAVENOUS | Status: AC
  Administered 2020-07-12: 868 mg via INTRAVENOUS
  Filled 2020-07-12: qty 43.4

## 2020-07-12 MED ORDER — SODIUM CHLORIDE 0.9 % IV SOLN
10.0000 mg | Freq: Once | INTRAVENOUS | Status: AC
  Administered 2020-07-12: 10 mg via INTRAVENOUS
  Filled 2020-07-12: qty 10

## 2020-07-12 NOTE — Patient Instructions (Signed)
[facility] Discharge Instructions for Patients Receiving Chemotherapy  Today you received the following chemotherapy agents   To help prevent nausea and vomiting after your treatment, we encourage you to take your nausea medication   If you develop nausea and vomiting that is not controlled by your nausea medication, call the clinic.   BELOW ARE SYMPTOMS THAT SHOULD BE REPORTED IMMEDIATELY:  *FEVER GREATER THAN 100.5 F  *CHILLS WITH OR WITHOUT FEVER  NAUSEA AND VOMITING THAT IS NOT CONTROLLED WITH YOUR NAUSEA MEDICATION  *UNUSUAL SHORTNESS OF BREATH  *UNUSUAL BRUISING OR BLEEDING  TENDERNESS IN MOUTH AND THROAT WITH OR WITHOUT PRESENCE OF ULCERS  *URINARY PROBLEMS  *BOWEL PROBLEMS  UNUSUAL RASH Items with * indicate a potential emergency and should be followed up as soon as possible.  Feel free to call the clinic should you have any questions or concerns. The clinic phone number is [phone].  Please show the CHEMO ALERT CARD at check-in to the Emergency Department and triage nurse.   

## 2020-07-12 NOTE — Progress Notes (Signed)
Labs reviewed with MD at office visit today. Proceed with treatment per MD.   3790- patient called out to let staff know he felt nauseated, he was shaking, he said he "got cold all of a sudden" . Vitals taken, MD notified. Chemotherapy stopped per MD. Buddy Duty a new bag of normal saline to go at a rate of 75 ml/hour and gave demerol 25 mg per MD orders.   Will continue to monitor patient per MD for an hour. No more chemotherapy today per MD.   1510-vitals assessed , MD made aware. Ok for discharge with blood cultures time 2, labs on Friday and follow up with MD on Friday as well. Treatment given per orders Discharged home from clinic via wheelchair. Follow up as scheduled.

## 2020-07-12 NOTE — Progress Notes (Signed)
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[place])   12/29/2019 Cancer Staging   Staging form: Esophagus - Adenocarcinoma, AJCC 8th Edition - Clinical stage from 12/29/2019: Stage IVB (cTX, cN3, cM1) - Signed by Derek Jack, MD on 12/29/2019   12/31/2019 -  Chemotherapy   The patient had palonosetron (ALOXI) injection 0.25 mg, 0.25 mg, Intravenous,  Once, 12 of 16 cycles Administration: 0.25 mg (12/31/2019), 0.25 mg (01/14/2020), 0.25 mg (01/28/2020), 0.25 mg (02/11/2020), 0.25 mg (02/25/2020), 0.25 mg (03/10/2020), 0.25 mg (03/24/2020), 0.25 mg (04/12/2020), 0.25 mg (04/26/2020), 0.25 mg (05/10/2020), 0.25 mg (05/24/2020), 0.25 mg (06/28/2020) leucovorin 868 mg in dextrose 5 % 250 mL infusion, 400 mg/m2 = 868 mg, Intravenous,  Once, 12 of 16 cycles Administration: 868 mg (12/31/2019), 868 mg (01/14/2020), 868 mg (01/28/2020), 868 mg (02/11/2020), 868 mg (02/25/2020), 868 mg (03/10/2020), 868 mg (03/24/2020), 868 mg (04/12/2020), 868 mg (04/26/2020), 868 mg (05/10/2020), 868 mg (05/24/2020), 868 mg (06/28/2020) oxaliplatin (ELOXATIN) 150 mg in dextrose 5 % 500 mL chemo infusion, 68 mg/m2 = 150 mg (80 % of original dose 85 mg/m2), Intravenous,  Once, 12 of 16 cycles Dose modification: 68 mg/m2 (80 % of original dose 85 mg/m2, Cycle 1, Reason: Other (see comments), Comment: diabetic neuropathy) Administration: 150 mg (12/31/2019), 150 mg (01/14/2020), 150 mg (01/28/2020), 150 mg  (02/11/2020), 150 mg (02/25/2020), 150 mg (03/10/2020), 150 mg (03/24/2020), 150 mg (04/12/2020), 150 mg (04/26/2020), 150 mg (05/10/2020), 150 mg (05/24/2020), 150 mg (06/28/2020) fluorouracil (ADRUCIL) chemo injection 850 mg, 400 mg/m2 = 850 mg, Intravenous,  Once, 12 of 16 cycles Administration: 850 mg (12/31/2019), 850 mg (01/14/2020), 850 mg (01/28/2020), 850 mg (02/11/2020), 850 mg (02/25/2020), 850 mg (03/10/2020), 850 mg (03/24/2020), 850 mg (04/12/2020), 850 mg (04/26/2020), 850 mg (05/10/2020), 850 mg (05/24/2020), 850 mg (06/28/2020) fluorouracil (ADRUCIL) 5,000 mg in sodium chloride 0.9 % 150 mL chemo infusion, 2,310 mg/m2 = 5,200 mg, Intravenous, 1 Day/Dose, 12 of 16 cycles Administration: 5,000 mg (12/31/2019), 5,000 mg (01/14/2020), 5,000 mg (01/28/2020), 5,000 mg (02/11/2020), 5,000 mg (02/25/2020), 5,000 mg (03/10/2020), 5,000 mg (03/24/2020), 5,000 mg (04/12/2020), 5,000 mg (04/26/2020), 5,000 mg (05/10/2020), 5,000 mg (05/24/2020), 5,200 mg (06/28/2020) nivolumab (OPDIVO) 240 mg in sodium chloride 0.9 % 100 mL chemo infusion, 240 mg (100 % of original dose 240 mg), Intravenous, Once, 12 of 16 cycles Dose modification: 240 mg (original dose 240 mg, Cycle 1, Reason: Provider Judgment) Administration: 240 mg (12/31/2019), 240 mg (01/14/2020), 240 mg (01/28/2020), 240 mg (02/11/2020), 240 mg (02/25/2020), 240 mg (03/10/2020), 240 mg (03/24/2020), 240 mg (04/12/2020), 240 mg (04/26/2020), 240 mg (05/10/2020), 240 mg (05/24/2020), 240 mg (06/28/2020)  for chemotherapy treatment.      CANCER STAGING: Cancer Staging Adenocarcinoma of gastroesophageal junction [facility]) Staging form: Esophagus - Adenocarcinoma, AJCC 8th Edition - Clinical stage from 12/29/2019: Stage IVB (cTX, cN3, cM1) - Signed by Derek Jack, MD on 12/29/2019   INTERVAL HISTORY:  [name], a 69 y.o. male, returns for routine follow-up and consideration for  next cycle of chemotherapy. Timothy Oconnor was last seen on 06/28/2020.  Due for cycle #13 of FOLFOX and  nivolumab today.   Overall, he tells me he has been feeling pretty well. He will have a procedure with Dr. Carol Ada at [facility] for his paralyzed left vocal cord on 9/16. He still has intermittent inability to swallow solid food due to the paralysis, so he uses his tube feed, but he is able to drink Boost and Ensure by mouth; he tries to eat solid food by mouth as often as he can, but cannot swallow meat. His shoulder pain has resolved and he seldom takes the oxycodone, only as needed. His appetite is good. He denies having any numbness or tingling, cold sensitivity, nor N/V/D.  Overall, he feels ready for next cycle of chemo today.    REVIEW OF SYSTEMS:  Review of Systems  Constitutional: Positive for appetite change (mildly decreased) and fatigue (mild).  HENT:   Positive for trouble swallowing (w/ solids).   Respiratory: Positive for shortness of breath.   Gastrointestinal: Negative for diarrhea, nausea and vomiting.  Musculoskeletal: Negative for arthralgias and myalgias.  Neurological: Negative for numbness.  Psychiatric/Behavioral: Positive for sleep disturbance.  All other systems reviewed and are negative.   PAST MEDICAL/SURGICAL HISTORY:  Past Medical History:  Diagnosis Date  . 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 Anxiety   . Diabetes ([place])   . Heart attack ([place])   . Heart disease   . High cholesterol   . Hyperplasia of prostate   . Hypertension   . Low back pain   . Port-A-Cath in place 12/30/2019  . Tremor    Past Surgical History:  Procedure Laterality Date  . arterectomy     1993  . BIOPSY  12/01/2019   Procedure: BIOPSY;  Surgeon: Rogene Houston, MD;  Location: [facility] END[facility];  Service: Endoscopy;;  esophagus  . ESOPHAGOGASTRODUODENOSCOPY (EGD) WITH PROPOFOL N/A 12/01/2019   Procedure: ESOPHAGOGASTRODUODENOSCOPY (EGD) WITH PROPOFOL;  Surgeon: Rogene Houston, MD;  Location: [facility] ENDO SU[facility];  Service: Endoscopy;  Laterality: N/A;  .  ESOPHAGOGASTRODUODENOSCOPY (EGD) WITH PROPOFOL N/A 12/24/2019   Procedure: ESOPHAGOGASTRODUODENOSCOPY (EGD) WITH PROPOFOL;  Surgeon: Aviva Signs, MD;  Location: [facility] ORS;  Service: General;  Laterality: N/A;  . Heart stents     2002, 2004, 2006  . PEG PLACEMENT N/A 12/24/2019   Procedure: PERCUTANEOUS ENDOSCOPIC GASTROSTOMY (PEG) PLACEMENT;  Surgeon: Aviva Signs, MD;  Location: [facility] ORS;  Service: General;  Laterality: N/A;  . PORTACATH PLACEMENT Left 12/24/2019   Procedure: INSERTION PORT-A-CATH;  Surgeon: Aviva Signs, MD;  Location: [facility] ORS;  Service: General;  Laterality: Left;    SOCIAL HISTORY:  Social History   Socioeconomic History  . Marital status: Married    Spouse name: Not on file  . Number of children: 2  . Years of education: 53  . Highest education level: Not on file  Occupational History  . Occupation: [employer]    Comment: Delivers medication  Tobacco Use  . Smoking status: Former Smoker    Packs/day: 1.00    Years: 50.00    Pack years: 50.00    Quit date: 12/21/2017    Years since quitting: 2.5  . Smokeless tobacco: Never Used  . Tobacco comment: Quit 2017  Vaping Use  . Vaping Use: Never used  Substance and Sexual Activity  . Alcohol use: Yes    Alcohol/week: 0.0 standard drinks    Comment: 3 drinks per week  .  Drug use: No  . Sexual activity: Yes  Other Topics Concern  . Not on file  Social History Narrative   Lives at home with his wife.   Right-handed.   2 diet cokes per day.   Social Determinants of Health   Financial Resource Strain: Low Risk   . Difficulty of Paying Living Expenses: Not hard at all  Food Insecurity: No Food Insecurity  . Worried About Charity fundraiser in the Last Year: Never true  . Ran Out of Food in the Last Year: Never true  Transportation Needs: No Transportation Needs  . Lack of Transportation (Medical): No  . Lack of Transportation (Non-Medical): No  Physical Activity: Insufficiently Active  . Days of Exercise  per Week: 2 days  . Minutes of Exercise per Session: 30 min  Stress: No Stress Concern Present  . Feeling of Stress : Only a little  Social Connections: Socially Integrated  . Frequency of Communication with Friends and Family: More than three times a week  . Frequency of Social Gatherings with Friends and Family: More than three times a week  . Attends Religious Services: 1 to 4 times per year  . Active Member of Clubs or Organizations: No  . Attends Archivist Meetings: 1 to 4 times per year  . Marital Status: Married  Human resources officer Violence: Not At Risk  . Fear of Current or Ex-Partner: No  . Emotionally Abused: No  . Physically Abused: No  . Sexually Abused: No    FAMILY HISTORY:  Family History  Problem Relation Age of Onset  . Alzheimer's disease Mother   . Diabetes Father   . Heart disease Father   . Stroke Father   . Heart disease Brother   . Colon cancer Brother   . Alzheimer's disease Sister     CURRENT MEDICATIONS:  Current Outpatient Medications  Medication Sig Dispense Refill  . ALPRAZolam (XANAX) 0.5 MG tablet Take 1 tablet (0.5 mg total) by mouth 2 (two) times daily as needed for anxiety. 30 tablet 0  . ascorbic acid (VITAMIN C) 500 MG tablet Take 500 mg by mouth 2 (two) times a week.     Marland Kitchen aspirin EC 81 MG tablet Take 81 mg by mouth daily.     Marland Kitchen atorvastatin (LIPITOR) 80 MG tablet Take 80 mg by mouth every evening.     . calcium-vitamin D (OSCAL WITH D) 500-200 MG-UNIT tablet Take 2 tablets by mouth 2 (two) times daily.    Marland Kitchen co-enzyme Q-10 30 MG capsule Take 30 mg by mouth daily.    Marland Kitchen ezetimibe (ZETIA) 10 MG tablet Take 10 mg by mouth daily.     Marland Kitchen FLUOROURACIL IV Inject into the vein every 14 (fourteen) days.    . folic acid (FOLVITE) 725 MCG tablet Take 800 mcg by mouth daily.     Marland Kitchen HYDROcodone-acetaminophen (HYCET) 7.5-325 mg/15 ml solution Take 15 mLs by mouth every 4 (four) hours as needed for moderate pain. 120 mL 0  . isosorbide  mononitrate (IMDUR) 60 MG 24 hr tablet Take 60 mg by mouth daily.    Marland Kitchen LEUCOVORIN CALCIUM IV Inject into the vein every 14 (fourteen) days.    Marland Kitchen lidocaine-prilocaine (EMLA) cream Apply a dime-sized amount to port a cath site and cover with plastic wrap 1 hour prior to chemotherapy appointments 30 g 3  . losartan (COZAAR) 50 MG tablet Take 25 mg by mouth daily.    . metFORMIN (GLUCOPHAGE-XR) 500 MG 24  hr tablet Take 2,000 mg by mouth every morning.    . metoprolol tartrate (LOPRESSOR) 25 MG tablet Take 0.5 tablets (12.5 mg total) by mouth 2 (two) times daily. (Patient taking differently: Take 25 mg by mouth 2 (two) times daily. ) 60 tablet 1  . Multiple Vitamin (MULTI-VITAMINS) TABS Take 1 tablet by mouth daily.     Marland Kitchen NIVOLUMAB IV Inject into the vein every 14 (fourteen) days.    . OXALIPLATIN IV Inject into the vein every 14 (fourteen) days.    . Oxycodone HCl 10 MG TABS Take 1 tablet (10 mg total) by mouth at bedtime as needed. 30 tablet 0  . prochlorperazine (COMPAZINE) 10 MG tablet Take 1 tablet (10 mg total) by mouth every 6 (six) hours as needed (Nausea or vomiting). 30 tablet 1  . tamsulosin (FLOMAX) 0.4 MG CAPS capsule Take 0.4 mg by mouth daily.      No current facility-administered medications for this visit.    ALLERGIES:  No Known Allergies  PHYSICAL EXAM:  Performance status (ECOG): 1 - Symptomatic but completely ambulatory  Vitals:   07/12/20 0815  BP: (!) 102/53  Pulse: 75  Resp: 18  Temp: 97.7 F (36.5 C)  SpO2: 99%   Wt Readings from Last 3 Encounters:  07/12/20 202 lb 1.6 oz (91.7 kg)  06/28/20 (!) 205 lb 14.4 oz (93.4 kg)  06/21/20 206 lb 8 oz (93.7 kg)   Physical Exam Vitals reviewed.  Constitutional:      Appearance: Normal appearance.  Cardiovascular:     Rate and Rhythm: Normal rate and regular rhythm.     Pulses: Normal pulses.     Heart sounds: Normal heart sounds.  Pulmonary:     Effort: Pulmonary effort is normal.     Breath sounds: Normal  breath sounds.  Chest:     Comments: Port-a-Cath on L chest Abdominal:     Palpations: Abdomen is soft. There is no mass.     Tenderness: There is no abdominal tenderness.  Musculoskeletal:     Right lower leg: No edema.     Left lower leg: No edema.  Lymphadenopathy:     Cervical: No cervical adenopathy.     Upper Body:     Right upper body: No supraclavicular adenopathy.     Left upper body: No supraclavicular adenopathy.  Neurological:     General: No focal deficit present.     Mental Status: He is alert and oriented to person, place, and time.  Psychiatric:        Mood and Affect: Mood normal.        Behavior: Behavior normal.     LABORATORY DATA:  I have reviewed the labs as listed.  CBC Latest Ref Rng & Units 07/12/2020 06/28/2020 06/21/2020  WBC 4.0 - 10.5 K/uL 6.1 7.6 7.3  Hemoglobin 13.0 - 17.0 g/dL 10.2(L) 11.5(L) 12.3(L)  Hematocrit 39 - 52 % 31.3(L) 34.8(L) 37.6(L)  Platelets 150 - 400 K/uL 265 260 269   CMP Latest Ref Rng & Units 07/12/2020 06/28/2020 06/21/2020  Glucose 70 - 99 mg/dL 228(H) 170(H) 150(H)  BUN 8 - 23 mg/dL _0 Creatinine 0.61 - 1.24 mg/dL 0.81 0.70 0.80  Sodium 135 - 145 mmol/L 135 134(L) 136  Potassium 3.5 - 5.1 mmol/L 3.9 3.8 4.5  Chloride 98 - 111 mmol/L 99 98 99  CO2 22 - 32 mmol/L _1 Calcium 8.9 - 10.3 mg/dL 8.9 9.1 9.5  Total Protein 6.5 -  8.1 g/dL 6.8 7.0 7.2  Total Bilirubin 0.3 - 1.2 mg/dL 0.4 0.6 0.3  Alkaline Phos 38 - 126 U/L 56 48 46  AST 15 - 41 U/L _0 ALT 0 - 44 U/L 30 45(H) 27   Lab Results  Component Value Date   LDH 107 06/28/2020   LDH 131 06/14/2020   LDH 145 03/24/2020   Lab Results  Component Value Date   CEA1 3.1 06/28/2020   CEA1 3.1 06/14/2020   CEA1 2.7 05/10/2020    DIAGNOSTIC IMAGING:  I have independently reviewed the scans and discussed with the patient. No results found.   ASSESSMENT:  1. 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 HER-2 FISH to be  followed up. PD-L1 CPS-5%. -FOLFOX and Opdivo started on 12/31/2019. -PET scan on 03/22/2020 showed marked reduction in size and metabolic activity in the distal esophageal lesion, marked reduction in size of mediastinal adenopathy. Near complete resolution with only 2 small mildly metabolic mediastinal lymph nodes remaining. Resolution of lymph nodes in the upper abdomen. Right hip bone lesion is still present but better. -CT CAP on 06/09/2020 showed interval resolution of previously demonstrated tree-in-bud nodularity in the lungs. Stable enlarged subcarinal lymph node and small residual lymph nodes with the gastrohepatic ligament and porta hepatis unchanged from the most recent PET scan.   PLAN:  1. Stage IV GE junction adenocarcinoma: -He tolerated last cycle very well.  I have reviewed his labs today.  LFTs show decreased albumin of 3.2.  CBC was within normal limits. -We will proceed with his next cycle today.  I will reevaluate him in 2 weeks.  2. Hoarseness of voice: -He lost his voice for more than a month. 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  He is having a procedure done on September 16.  3. Nutrition: -For the last 1 week, he started using feeding tube.  He is taking in 2 cans of Osmolite per day as he started having occasional episodes of food getting stuck in the throat.  4. Bone metastasis: -Continue Zometa monthly.  5. Diabetes: -Continue Metformin.  6. Hypertension: -Continue Lopressor 25 mg daily.  7.Anxiety: -Continue Xanax 0.5 mg as needed.  Addendum: -As he was finishing up his oxaliplatin infusion, he suddenly developed rigors. -His vitals were stable.  Temperature was normal.  However he continued to have rigors. -We have given Demerol 25 mg IV x1.  His rigors subsided within a few minutes. -I have reassessed him again in 20 minutes and 1 hour.  We have given 500 mL of normal saline. -I will  order blood cultures today. -We will reevaluate him on Friday with labs. -I will hold off on 5-FU pump.  We have also discarded the remaining oxaliplatin.   Orders placed this encounter:  No orders of the defined types were placed in this encounter.  Total time spent is 40 minutes with more than 70% of the time spent face-to-face evaluating and management of adverse effects during chemotherapy.  Derek Jack, MD [place] [phone]   I, Milinda Antis, am acting as a scribe for Dr. Sanda Linger.  I, Derek Jack MD, have reviewed the above documentation for accuracy and completeness, and I agree with the above.

## 2020-07-12 NOTE — Patient Instructions (Signed)
[facility] at [facility] °Discharge Instructions ° °You were seen today by Dr. Katragadda. He went over your recent results. You received your treatment today. Dr. Katragadda will see you back in 2 weeks for labs and follow up. ° ° °Thank you for choosing [facility] at [facility] to provide your oncology and hematology care.  To afford each patient quality time with our provider, please arrive at least 15 minutes before your scheduled appointment time.  ° °If you have a lab appointment with the Cancer Center please come in thru the Main Entrance and check in at the main information desk ° °You need to re-schedule your appointment should you arrive 10 or more minutes late.  We strive to give you quality time with our providers, and arriving late affects you and other patients whose appointments are after yours.  Also, if you no show three or more times for appointments you may be dismissed from the clinic at the providers discretion.     °Again, thank you for choosing [facility].  Our hope is that these requests will decrease the amount of time that you wait before being seen by our physicians.       °_____________________________________________________________ ° °Should you have questions after your visit to [facility], please contact our office at [phone] between the hours of 8:00 a.m. and 4:30 p.m.  Voicemails left after 4:00 p.m. will not be returned until the following business day.  For prescription refill requests, have your pharmacy contact our office and allow 72 hours.   ° °Cancer Center Support Programs:  ° °> Cancer Support Group  °2nd Tuesday of the month 1pm-2pm, Journey Room  ° ° °

## 2020-07-12 NOTE — Progress Notes (Signed)
Dr. Delton Coombes has assessed patient and reviewed labs.  Patient is okay to proceed with treatment today.

## 2020-07-12 NOTE — Addendum Note (Signed)
Addended by: Joanne Gavel T on: 07/12/2020 03:58 PM   Modules accepted: Orders

## 2020-07-13 ENCOUNTER — Encounter (HOSPITAL_COMMUNITY): Payer: Self-pay

## 2020-07-13 ENCOUNTER — Telehealth (HOSPITAL_COMMUNITY): Payer: Self-pay

## 2020-07-13 MED ORDER — AMOXICILLIN-POT CLAVULANATE 875-125 MG PO TABS
1.0000 | ORAL_TABLET | Freq: Two times a day (BID) | ORAL | 0 refills | Status: DC
Start: 2020-07-13 — End: 2020-08-03

## 2020-07-13 NOTE — Addendum Note (Signed)
Addended by: Derek Jack on: 07/13/2020 06:18 PM   Modules accepted: Orders

## 2020-07-13 NOTE — Addendum Note (Signed)
Addended by: Donnie Aho on: 07/13/2020 01:01 PM   Modules accepted: Orders

## 2020-07-14 ENCOUNTER — Encounter (HOSPITAL_COMMUNITY): Payer: Medicare HMO

## 2020-07-15 ENCOUNTER — Encounter (HOSPITAL_COMMUNITY): Payer: Self-pay

## 2020-07-16 ENCOUNTER — Inpatient Hospital Stay (HOSPITAL_COMMUNITY): Payer: Medicare HMO

## 2020-07-16 ENCOUNTER — Inpatient Hospital Stay (HOSPITAL_COMMUNITY): Payer: Medicare HMO | Admitting: Hematology

## 2020-07-16 ENCOUNTER — Other Ambulatory Visit: Payer: Self-pay

## 2020-07-16 VITALS — BP 103/64 | HR 70 | Temp 96.9°F | Resp 18 | Wt 195.6 lb

## 2020-07-16 DIAGNOSIS — C160 Malignant neoplasm of cardia: Secondary | ICD-10-CM

## 2020-07-16 DIAGNOSIS — Z95828 Presence of other vascular implants and grafts: Secondary | ICD-10-CM

## 2020-07-16 DIAGNOSIS — Z5111 Encounter for antineoplastic chemotherapy: Secondary | ICD-10-CM | POA: Diagnosis not present

## 2020-07-16 LAB — COMPREHENSIVE METABOLIC PANEL
ALT: 47 U/L — ABNORMAL HIGH (ref 0–44)
AST: 46 U/L — ABNORMAL HIGH (ref 15–41)
Albumin: 3.2 g/dL — ABNORMAL LOW (ref 3.5–5.0)
Alkaline Phosphatase: 60 U/L (ref 38–126)
Anion gap: 9 (ref 5–15)
BUN: 15 mg/dL (ref 8–23)
CO2: 25 mmol/L (ref 22–32)
Calcium: 9.8 mg/dL (ref 8.9–10.3)
Chloride: 101 mmol/L (ref 98–111)
Creatinine, Ser: 0.74 mg/dL (ref 0.61–1.24)
GFR calc Af Amer: >60 mL/min (ref >60)
GFR calc non Af Amer: >60 mL/min (ref >60)
Glucose, Bld: 141 mg/dL — ABNORMAL HIGH (ref 70–99)
Potassium: 4.3 mmol/L (ref 3.5–5.1)
Sodium: 135 mmol/L (ref 135–145)
Total Bilirubin: 0.5 mg/dL (ref 0.3–1.2)
Total Protein: 7.2 g/dL (ref 6.5–8.1)

## 2020-07-16 LAB — CBC WITH DIFFERENTIAL/PLATELET
Abs Immature Granulocytes: 0.17 10*3/uL — ABNORMAL HIGH (ref 0.00–0.07)
Basophils Absolute: 0.1 10*3/uL (ref 0.0–0.1)
Basophils Relative: 1 %
Eosinophils Absolute: 0.1 10*3/uL (ref 0.0–0.5)
Eosinophils Relative: 2 %
HCT: 32.8 % — ABNORMAL LOW (ref 39.0–52.0)
Hemoglobin: 10.5 g/dL — ABNORMAL LOW (ref 13.0–17.0)
Immature Granulocytes: 3 %
Lymphocytes Relative: 47 %
Lymphs Abs: 2.7 10*3/uL (ref 0.7–4.0)
MCH: 31.3 pg (ref 26.0–34.0)
MCHC: 32 g/dL (ref 30.0–36.0)
MCV: 97.6 fL (ref 80.0–100.0)
Monocytes Absolute: 1.1 10*3/uL — ABNORMAL HIGH (ref 0.1–1.0)
Monocytes Relative: 19 %
Neutro Abs: 1.6 10*3/uL — ABNORMAL LOW (ref 1.7–7.7)
Neutrophils Relative %: 28 %
Platelets: 346 10*3/uL (ref 150–400)
RBC: 3.36 MIL/uL — ABNORMAL LOW (ref 4.22–5.81)
RDW: 13.9 % (ref 11.5–15.5)
WBC: 5.6 10*3/uL (ref 4.0–10.5)
nRBC: 0 % (ref 0.0–0.2)

## 2020-07-16 LAB — LACTATE DEHYDROGENASE: LDH: 196 U/L — ABNORMAL HIGH (ref 98–192)

## 2020-07-16 LAB — MAGNESIUM: Magnesium: 1.8 mg/dL (ref 1.7–2.4)

## 2020-07-16 MED ORDER — HEPARIN SOD (PORK) LOCK FLUSH 100 UNIT/ML IV SOLN
500.0000 [IU] | Freq: Once | INTRAVENOUS | Status: AC
  Administered 2020-07-16: 500 [IU] via INTRAVENOUS

## 2020-07-16 MED ORDER — SODIUM CHLORIDE 0.9% FLUSH
10.0000 mL | INTRAVENOUS | Status: DC | PRN
  Administered 2020-07-16: 10 mL via INTRAVENOUS

## 2020-07-16 NOTE — Patient Instructions (Addendum)
[place] at [facility] Discharge Instructions  You were seen and examined today by Dr. Delton Coombes. Dr. Delton Coombes discussed your recent chills, fever and weakness you've been experiencing.  Dr. Delton Coombes has noticed increasing weakness since you began having trouble with your vocal chords. You will continue seeing the ENT with [facility].  Increase your fluid intake to 100oz a day.   Thank you for choosing [place] at [facility] to provide your oncology and hematology care.  To afford each patient quality time with our provider, please arrive at least 15 minutes before your scheduled appointment time.   If you have a lab appointment with the [facility] please come in thru the Main Entrance and check in at the main information desk.  You need to re-schedule your appointment should you arrive 10 or more minutes late.  We strive to give you quality time with our providers, and arriving late affects you and other patients whose appointments are after yours.  Also, if you no show three or more times for appointments you may be dismissed from the clinic at the providers discretion.     Again, thank you for choosing [facility].  Our hope is that these requests will decrease the amount of time that you wait before being seen by our physicians.       _____________________________________________________________  Should you have questions after your visit to [facility], please contact our office at [phone] and follow the prompts.  Our office hours are 8:00 a.m. and 4:30 p.m. Monday - Friday.  Please note that voicemails left after 4:00 p.m. may not be returned until the following business day.  We are closed weekends and major holidays.  You do have access to a nurse 24-7, just call the main number to the clinic [phone] and do not press any options, hold on the line and a nurse will answer the phone.    For  prescription refill requests, have your pharmacy contact our office and allow 72 hours.    Due to Covid, you will need to wear a mask upon entering the hospital. If you do not have a mask, a mask will be given to you at the Main Entrance upon arrival. For doctor visits, patients may have 1 support person age 46 or older with them. For treatment visits, patients can not have anyone with them due to social distancing guidelines and our immunocompromised population.

## 2020-07-16 NOTE — Progress Notes (Addendum)
[place],  [id number]   CLINIC:  Medical Oncology/Hematology  PCP:  [name], MD [address] 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[place])   12/29/2019 Cancer Staging   Staging form: Esophagus - Adenocarcinoma, AJCC 8th Edition - Clinical stage from 12/29/2019: Stage IVB (cTX, cN3, cM1) - Signed by Derek Jack, MD on 12/29/2019   12/31/2019 -  Chemotherapy   The patient had palonosetron (ALOXI) injection 0.25 mg, 0.25 mg, Intravenous,  Once, 13 of 16 cycles Administration: 0.25 mg (12/31/2019), 0.25 mg (01/14/2020), 0.25 mg (01/28/2020), 0.25 mg (02/11/2020), 0.25 mg (02/25/2020), 0.25 mg (03/10/2020), 0.25 mg (03/24/2020), 0.25 mg (04/12/2020), 0.25 mg (04/26/2020), 0.25 mg (05/10/2020), 0.25 mg (05/24/2020), 0.25 mg (06/28/2020), 0.25 mg (07/12/2020) leucovorin 868 mg in dextrose 5 % 250 mL infusion, 400 mg/m2 = 868 mg, Intravenous,  Once, 13 of 16 cycles Administration: 868 mg (12/31/2019), 868 mg (01/14/2020), 868 mg (01/28/2020), 868 mg (02/11/2020), 868 mg (02/25/2020), 868 mg (03/10/2020), 868 mg (03/24/2020), 868 mg (04/12/2020), 868 mg (04/26/2020), 868 mg (05/10/2020), 868 mg (05/24/2020), 868 mg (06/28/2020), 868 mg (07/12/2020) oxaliplatin (ELOXATIN) 150 mg in dextrose 5 % 500 mL chemo infusion, 68 mg/m2 = 150 mg (80 % of original dose 85 mg/m2), Intravenous,  Once, 13 of 16 cycles Dose modification: 68 mg/m2 (80 % of original dose 85 mg/m2, Cycle 1, Reason: Other (see comments), Comment: diabetic neuropathy) Administration: 150 mg (12/31/2019), 150 mg  (01/14/2020), 150 mg (01/28/2020), 150 mg (02/11/2020), 150 mg (02/25/2020), 150 mg (03/10/2020), 150 mg (03/24/2020), 150 mg (04/12/2020), 150 mg (04/26/2020), 150 mg (05/10/2020), 150 mg (05/24/2020), 150 mg (06/28/2020), 150 mg (07/12/2020) fluorouracil (ADRUCIL) chemo injection 850 mg, 400 mg/m2 = 850 mg, Intravenous,  Once, 13 of 16 cycles Administration: 850 mg (12/31/2019), 850 mg (01/14/2020), 850 mg (01/28/2020), 850 mg (02/11/2020), 850 mg (02/25/2020), 850 mg (03/10/2020), 850 mg (03/24/2020), 850 mg (04/12/2020), 850 mg (04/26/2020), 850 mg (05/10/2020), 850 mg (05/24/2020), 850 mg (06/28/2020) fluorouracil (ADRUCIL) 5,000 mg in sodium chloride 0.9 % 150 mL chemo infusion, 2,310 mg/m2 = 5,200 mg, Intravenous, 1 Day/Dose, 13 of 16 cycles Administration: 5,000 mg (12/31/2019), 5,000 mg (01/14/2020), 5,000 mg (01/28/2020), 5,000 mg (02/11/2020), 5,000 mg (02/25/2020), 5,000 mg (03/10/2020), 5,000 mg (03/24/2020), 5,000 mg (04/12/2020), 5,000 mg (04/26/2020), 5,000 mg (05/10/2020), 5,000 mg (05/24/2020), 5,200 mg (06/28/2020) nivolumab (OPDIVO) 240 mg in sodium chloride 0.9 % 100 mL chemo infusion, 240 mg (100 % of original dose 240 mg), Intravenous, Once, 13 of 16 cycles Dose modification: 240 mg (original dose 240 mg, Cycle 1, Reason: Provider Judgment) Administration: 240 mg (12/31/2019), 240 mg (01/14/2020), 240 mg (01/28/2020), 240 mg (02/11/2020), 240 mg (02/25/2020), 240 mg (03/10/2020), 240 mg (03/24/2020), 240 mg (04/12/2020), 240 mg (04/26/2020), 240 mg (05/10/2020), 240 mg (05/24/2020), 240 mg (06/28/2020), 240 mg (07/12/2020)  for chemotherapy treatment.      CANCER STAGING: Cancer Staging Adenocarcinoma of gastroesophageal junction [facility]) Staging form: Esophagus - Adenocarcinoma, AJCC 8th Edition - Clinical stage from 12/29/2019: Stage IVB (cTX, cN3, cM1) - Signed by Derek Jack, MD on 12/29/2019   INTERVAL HISTORY:  Mr. Timothy  [place], a 69 y.o. male, returns for routine follow-up of his stage IV GE junction adenocarinoma.  [name] was last seen on 07/12/2020.   Today he is accompanied by his wife. He reports having a fever of 103.3 F on Monday night, for which he took a Tylenol and the fever broke before midnight; the fever started while he was getting the infusion on Monday, 8/9. He drenched the bed with his sweating. He reports getting chills while getting infusions before. He still reports intermittent trouble swallowing food and he sometimes eats and drinks 84 oz of fluids and shakes and 2-4 cans of Osmolyte, while other times he uses the PEG tube. His cough is producing clear sputum. He started taking Augmentin on 8/10 and denies having diarrhea.  REVIEW OF SYSTEMS:  Review of Systems  Constitutional: Positive for appetite change (mildly decreased) and fatigue (severe).  HENT:   Positive for trouble swallowing.   Respiratory: Positive for cough and shortness of breath.   Gastrointestinal: Negative for diarrhea.  Genitourinary: Positive for difficulty urinating.   Neurological: Positive for numbness (feet).  All other systems reviewed and are negative.   PAST MEDICAL/SURGICAL HISTORY:  Past Medical History:  Diagnosis Date  . 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 Anxiety   . Diabetes ([place])   . Heart attack ([place])   . Heart disease   . High cholesterol   . Hyperplasia of prostate   . Hypertension   . Low back pain   . Port-A-Cath in place 12/30/2019  . Tremor    Past Surgical History:  Procedure Laterality Date  . arterectomy     1993  . BIOPSY  12/01/2019   Procedure: BIOPSY;  Surgeon: Rogene Houston, MD;  Location: [facility];  Service: Endoscopy;;  esophagus  . ESOPHAGOGASTRODUODENOSCOPY (EGD) WITH PROPOFOL N/A 12/01/2019   Procedure: ESOPHAGOGASTRODUODENOSCOPY (EGD) WITH PROPOFOL;  Surgeon: Rogene Houston, MD;  Location: [facility];  Service: Endoscopy;  Laterality: N/A;  . ESOPHAGOGASTRODUODENOSCOPY (EGD) WITH PROPOFOL N/A 12/24/2019   Procedure:  ESOPHAGOGASTRODUODENOSCOPY (EGD) WITH PROPOFOL;  Surgeon: Aviva Signs, MD;  Location: [facility] ORS;  Service: General;  Laterality: N/A;  . Heart stents     2002, 2004, 2006  . PEG PLACEMENT N/A 12/24/2019   Procedure: PERCUTANEOUS ENDOSCOPIC GASTROSTOMY (PEG) PLACEMENT;  Surgeon: Aviva Signs, MD;  Location: [facility] ORS;  Service: General;  Laterality: N/A;  . PORTACATH PLACEMENT Left 12/24/2019   Procedure: INSERTION PORT-A-CATH;  Surgeon: Aviva Signs, MD;  Location: [facility] ORS;  Service: General;  Laterality: Left;    SOCIAL HISTORY:  Social History   Socioeconomic History  . Marital status: Married    Spouse name: Not on file  . Number of children: 2  . Years of education: 27  . Highest education level: Not on file  Occupational History  . Occupation: [employer]    Comment: Delivers medication  Tobacco Use  . Smoking status: Former Smoker    Packs/day: 1.00    Years: 50.00    Pack years: 50.00    Quit date: 12/21/2017    Years since quitting: 2.5  . Smokeless tobacco: Never Used  . Tobacco comment: Quit 2017  Vaping Use  . Vaping Use: Never used  Substance and Sexual Activity  . Alcohol use: Yes    Alcohol/week: 0.0 standard drinks    Comment: 3 drinks per week  . Drug use: No  . Sexual activity: Yes  Other Topics Concern  . Not on file  Social  History Narrative   Lives at home with his wife.   Right-handed.   2 diet cokes per day.   Social Determinants of Health   Financial Resource Strain: Low Risk   . Difficulty of Paying Living Expenses: Not hard at all  Food Insecurity: No Food Insecurity  . Worried About Charity fundraiser in the Last Year: Never true  . Ran Out of Food in the Last Year: Never true  Transportation Needs: No Transportation Needs  . Lack of Transportation (Medical): No  . Lack of Transportation (Non-Medical): No  Physical Activity: Insufficiently Active  . Days of Exercise per Week: 2 days  . Minutes of Exercise per Session: 30 min  Stress: No  Stress Concern Present  . Feeling of Stress : Only a little  Social Connections: Socially Integrated  . Frequency of Communication with Friends and Family: More than three times a week  . Frequency of Social Gatherings with Friends and Family: More than three times a week  . Attends Religious Services: 1 to 4 times per year  . Active Member of Clubs or Organizations: No  . Attends Archivist Meetings: 1 to 4 times per year  . Marital Status: Married  Human resources officer Violence: Not At Risk  . Fear of Current or Ex-Partner: No  . Emotionally Abused: No  . Physically Abused: No  . Sexually Abused: No    FAMILY HISTORY:  Family History  Problem Relation Age of Onset  . Alzheimer's disease Mother   . Diabetes Father   . Heart disease Father   . Stroke Father   . Heart disease Brother   . Colon cancer Brother   . Alzheimer's disease Sister     CURRENT MEDICATIONS:  Current Outpatient Medications  Medication Sig Dispense Refill  . ALPRAZolam (XANAX) 0.5 MG tablet Take 1 tablet (0.5 mg total) by mouth 2 (two) times daily as needed for anxiety. 30 tablet 0  . amoxicillin-clavulanate (AUGMENTIN) 875-125 MG tablet Take 1 tablet by mouth 2 (two) times daily. 14 tablet 0  . ascorbic acid (VITAMIN C) 500 MG tablet Take 500 mg by mouth 2 (two) times a week.     Marland Kitchen aspirin EC 81 MG tablet Take 81 mg by mouth daily.     Marland Kitchen atorvastatin (LIPITOR) 80 MG tablet Take 80 mg by mouth every evening.     . calcium-vitamin D (OSCAL WITH D) 500-200 MG-UNIT tablet Take 2 tablets by mouth 2 (two) times daily.    Marland Kitchen co-enzyme Q-10 30 MG capsule Take 30 mg by mouth daily.    Marland Kitchen ezetimibe (ZETIA) 10 MG tablet Take 10 mg by mouth daily.     Marland Kitchen FLUOROURACIL IV Inject into the vein every 14 (fourteen) days.    . folic acid (FOLVITE) 993 MCG tablet Take 800 mcg by mouth daily.     Marland Kitchen HYDROcodone-acetaminophen (HYCET) 7.5-325 mg/15 ml solution Take 15 mLs by mouth every 4 (four) hours as needed for moderate  pain. 120 mL 0  . isosorbide mononitrate (IMDUR) 60 MG 24 hr tablet Take 60 mg by mouth daily.    Marland Kitchen LEUCOVORIN CALCIUM IV Inject into the vein every 14 (fourteen) days.    Marland Kitchen lidocaine-prilocaine (EMLA) cream Apply a dime-sized amount to port a cath site and cover with plastic wrap 1 hour prior to chemotherapy appointments 30 g 3  . losartan (COZAAR) 50 MG tablet Take 25 mg by mouth daily.    . metFORMIN (GLUCOPHAGE-XR) 500 MG 24  hr tablet Take 2,000 mg by mouth every morning.    . metoprolol tartrate (LOPRESSOR) 25 MG tablet Take 0.5 tablets (12.5 mg total) by mouth 2 (two) times daily. (Patient taking differently: Take 25 mg by mouth 2 (two) times daily. ) 60 tablet 1  . Multiple Vitamin (MULTI-VITAMINS) TABS Take 1 tablet by mouth daily.     Marland Kitchen NIVOLUMAB IV Inject into the vein every 14 (fourteen) days.    . OXALIPLATIN IV Inject into the vein every 14 (fourteen) days.    . Oxycodone HCl 10 MG TABS Take 1 tablet (10 mg total) by mouth at bedtime as needed. 30 tablet 0  . prochlorperazine (COMPAZINE) 10 MG tablet Take 1 tablet (10 mg total) by mouth every 6 (six) hours as needed (Nausea or vomiting). 30 tablet 1  . tamsulosin (FLOMAX) 0.4 MG CAPS capsule Take 0.4 mg by mouth daily.      No current facility-administered medications for this visit.   Facility-Administered Medications Ordered in Other Visits  Medication Dose Route Frequency Provider Last Rate Last Admin  . heparin lock flush 100 unit/mL  500 Units Intravenous Once Derek Jack, MD      . sodium chloride flush (NS) 0.9 % injection 10 mL  10 mL Intravenous PRN Derek Jack, MD        ALLERGIES:  No Known Allergies  PHYSICAL EXAM:  Performance status (ECOG): 1 - Symptomatic but completely ambulatory  Vitals:   07/16/20 1022  BP: 103/64  Pulse: 70  Resp: 18  Temp: (!) 96.9 F (36.1 C)  SpO2: 95%   Wt Readings from Last 3 Encounters:  07/16/20 195 lb 9.6 oz (88.7 kg)  07/12/20 202 lb 1.6 oz (91.7 kg)    06/28/20 (!) 205 lb 14.4 oz (93.4 kg)   Physical Exam Vitals reviewed.  Constitutional:      Appearance: Normal appearance.  Chest:     Comments: Port-a-Cath on L chest Abdominal:     Comments: PEG tube  Neurological:     General: No focal deficit present.     Mental Status: He is alert and oriented to person, place, and time.  Psychiatric:        Mood and Affect: Mood normal.        Behavior: Behavior normal.      LABORATORY DATA:  I have reviewed the labs as listed.  CBC Latest Ref Rng & Units 07/12/2020 06/28/2020 06/21/2020  WBC 4.0 - 10.5 K/uL 6.1 7.6 7.3  Hemoglobin 13.0 - 17.0 g/dL 10.2(L) 11.5(L) 12.3(L)  Hematocrit 39 - 52 % 31.3(L) 34.8(L) 37.6(L)  Platelets 150 - 400 K/uL 265 260 269   CMP Latest Ref Rng & Units 07/12/2020 06/28/2020 06/21/2020  Glucose 70 - 99 mg/dL 228(H) 170(H) 150(H)  BUN 8 - 23 mg/dL _0 Creatinine 0.61 - 1.24 mg/dL 0.81 0.70 0.80  Sodium 135 - 145 mmol/L 135 134(L) 136  Potassium 3.5 - 5.1 mmol/L 3.9 3.8 4.5  Chloride 98 - 111 mmol/L 99 98 99  CO2 22 - 32 mmol/L _1 Calcium 8.9 - 10.3 mg/dL 8.9 9.1 9.5  Total Protein 6.5 - 8.1 g/dL 6.8 7.0 7.2  Total Bilirubin 0.3 - 1.2 mg/dL 0.4 0.6 0.3  Alkaline Phos 38 - 126 U/L 56 48 46  AST 15 - 41 U/L _2 ALT 0 - 44 U/L 30 45(H) 27   Lab Results  Component Value Date   LDH 107 06/28/2020   LDH  131 06/14/2020   LDH 145 03/24/2020   Lab Results  Component Value Date   CEA1 3.1 06/28/2020   CEA1 3.1 06/14/2020   CEA1 2.7 05/10/2020    DIAGNOSTIC IMAGING:  I have independently reviewed the scans and discussed with the patient. No results found.   ASSESSMENT:  1. 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 HER-2 FISH to be followed up. PD-L1 CPS-5%. -FOLFOX and Opdivo started on 12/31/2019. -PET scan on 03/22/2020 showed marked reduction in size and metabolic activity in the distal esophageal lesion, marked reduction in size of mediastinal  adenopathy. Near complete resolution with only 2 small mildly metabolic mediastinal lymph nodes remaining. Resolution of lymph nodes in the upper abdomen. Right hip bone lesion is still present but better. -CT CAP on 06/09/2020 showed interval resolution of previously demonstrated tree-in-bud nodularity in the lungs. Stable enlarged subcarinal lymph node and small residual lymph nodes with the gastrohepatic ligament and porta hepatis unchanged from the most recent PET scan.   PLAN:  1. Stage IV GE junction adenocarcinoma: -Last chemotherapy was on 07/12/2020.  He did not have any major chemotherapy related side effects.  However continues to have trouble swallowing. -I have reviewed his labs today.  AST and ALT are elevated at 46 and 47, likely from chemotherapy.  We will watch it next week.  CBC shows normal white count and platelets. -He was told to increase his fluid intake to 100 ounces daily. -Reviewed his blood cultures which were negative to date.  He will continue Augmentin twice daily.  Last fever was on Monday night up to 103 degrees.  He did not have any further chills or fevers after that.  We will follow up on his final blood cultures.  2. Hoarseness of voice: -He has hoarseness of the voice for more than a month. 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  Reports some dysphagia. -He is having a swallow study prior to injection of vocal cords.  3.  Weight loss: -He lost 7 pounds in the last 1 week.  He is taking in 2 to 4 cans of Osmolite depending on how much he eats. -I have recommended that he talk to a dietitian today.  4. Bone metastasis: -Continue Zometa monthly.  5. Diabetes: -Continue Metformin.  6. Hypertension: -Continue Lopressor 25 mg daily.  7.Anxiety: -Continue Xanax 0.5 mg as needed.   Orders placed this encounter:  No orders of the defined types were placed in this encounter.    Derek Jack,  MD [place] [phone]   I, Milinda Antis, am acting as a scribe for Dr. Sanda Linger.  I, Derek Jack MD, have reviewed the above documentation for accuracy and completeness, and I agree with the above.

## 2020-07-16 NOTE — Progress Notes (Signed)
Lynnae Prude presented for Portacath access and flush.  Portacath located left chest wall accessed with  H 20 needle.  Good blood return present. Portacath flushed with 57ml NS and 500U/38ml Heparin and needle removed intact.  Procedure tolerated well and without incident.

## 2020-07-16 NOTE — Progress Notes (Signed)
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  Patient receiving folfox and nivolumab.    Met with patient and wife today in clinic.  Chart reviewed and noted vocal cord paralysis, seen at [facility] and planning procedure on 9/16.  Patient has been using feeding tube of 2 cartons of osmolite 1.5 over the last week.  Reports that she is drinking ensure/boost and making own shakes.  Can eat about 1 meal per day but never knows which meal he can eat.  Planning swallow study at [facility] but has not been scheduled yet.  Reports at times maybe able to eat few bites then unable to eat any more due to swallowing.     Medications: reviewed   Labs: reviewed  Anthropometrics:   Weight 195 lb 9.6 oz today decreased from 209 lb on 7/2  212 lb 218 lb recently   NUTRITION DIAGNOSIS: Inadequate oral intake continues   INTERVENTION:  Recommend restarting osmolite 1.5 via tube, 6-8 per day if unable to eat orally.   Eat orally as tolerated. Patient has contact information    MONITORING, EVALUATION, GOAL: weight trends, intake, tube feeding    NEXT VISIT: 8/27 phone f/u  Sherese Heyward B. Zenia Resides, [place], [place] Registered Dietitian [phone] (mobile)

## 2020-07-17 LAB — CULTURE, BLOOD (ROUTINE X 2)
Culture: NO GROWTH
Culture: NO GROWTH
Special Requests: ADEQUATE

## 2020-07-26 ENCOUNTER — Inpatient Hospital Stay (HOSPITAL_COMMUNITY): Payer: Medicare HMO

## 2020-07-26 ENCOUNTER — Encounter (HOSPITAL_COMMUNITY): Payer: Self-pay | Admitting: Hematology

## 2020-07-26 ENCOUNTER — Other Ambulatory Visit (HOSPITAL_COMMUNITY): Payer: Self-pay | Admitting: *Deleted

## 2020-07-26 ENCOUNTER — Telehealth: Payer: Self-pay

## 2020-07-26 ENCOUNTER — Other Ambulatory Visit: Payer: Self-pay

## 2020-07-26 ENCOUNTER — Inpatient Hospital Stay (HOSPITAL_COMMUNITY): Payer: Medicare HMO | Admitting: Hematology

## 2020-07-26 VITALS — BP 102/53 | HR 88 | Temp 97.0°F | Resp 18

## 2020-07-26 VITALS — BP 95/61 | HR 91 | Temp 97.0°F | Resp 18 | Wt 202.8 lb

## 2020-07-26 DIAGNOSIS — C160 Malignant neoplasm of cardia: Secondary | ICD-10-CM

## 2020-07-26 DIAGNOSIS — C7951 Secondary malignant neoplasm of bone: Secondary | ICD-10-CM

## 2020-07-26 DIAGNOSIS — Z95828 Presence of other vascular implants and grafts: Secondary | ICD-10-CM

## 2020-07-26 DIAGNOSIS — Z5111 Encounter for antineoplastic chemotherapy: Secondary | ICD-10-CM | POA: Diagnosis not present

## 2020-07-26 LAB — CBC WITH DIFFERENTIAL/PLATELET
Abs Immature Granulocytes: 0.04 10*3/uL (ref 0.00–0.07)
Basophils Absolute: 0.1 10*3/uL (ref 0.0–0.1)
Basophils Relative: 1 %
Eosinophils Absolute: 0 10*3/uL (ref 0.0–0.5)
Eosinophils Relative: 0 %
HCT: 35.1 % — ABNORMAL LOW (ref 39.0–52.0)
Hemoglobin: 11.5 g/dL — ABNORMAL LOW (ref 13.0–17.0)
Immature Granulocytes: 0 %
Lymphocytes Relative: 27 %
Lymphs Abs: 2.6 10*3/uL (ref 0.7–4.0)
MCH: 31.6 pg (ref 26.0–34.0)
MCHC: 32.8 g/dL (ref 30.0–36.0)
MCV: 96.4 fL (ref 80.0–100.0)
Monocytes Absolute: 1 10*3/uL (ref 0.1–1.0)
Monocytes Relative: 10 %
Neutro Abs: 6 10*3/uL (ref 1.7–7.7)
Neutrophils Relative %: 62 %
Platelets: 385 10*3/uL (ref 150–400)
RBC: 3.64 MIL/uL — ABNORMAL LOW (ref 4.22–5.81)
RDW: 14.5 % (ref 11.5–15.5)
WBC: 9.7 10*3/uL (ref 4.0–10.5)
nRBC: 0 % (ref 0.0–0.2)

## 2020-07-26 LAB — COMPREHENSIVE METABOLIC PANEL
ALT: 30 U/L (ref 0–44)
AST: 26 U/L (ref 15–41)
Albumin: 3.2 g/dL — ABNORMAL LOW (ref 3.5–5.0)
Alkaline Phosphatase: 51 U/L (ref 38–126)
Anion gap: 12 (ref 5–15)
BUN: 20 mg/dL (ref 8–23)
CO2: 25 mmol/L (ref 22–32)
Calcium: 9.1 mg/dL (ref 8.9–10.3)
Chloride: 95 mmol/L — ABNORMAL LOW (ref 98–111)
Creatinine, Ser: 0.88 mg/dL (ref 0.61–1.24)
GFR calc Af Amer: >60 mL/min (ref >60)
GFR calc non Af Amer: >60 mL/min (ref >60)
Glucose, Bld: 195 mg/dL — ABNORMAL HIGH (ref 70–99)
Potassium: 4.4 mmol/L (ref 3.5–5.1)
Sodium: 132 mmol/L — ABNORMAL LOW (ref 135–145)
Total Bilirubin: 0.5 mg/dL (ref 0.3–1.2)
Total Protein: 7 g/dL (ref 6.5–8.1)

## 2020-07-26 LAB — LACTATE DEHYDROGENASE: LDH: 116 U/L (ref 98–192)

## 2020-07-26 LAB — TSH: TSH: 5.111 u[IU]/mL — ABNORMAL HIGH (ref 0.350–4.500)

## 2020-07-26 LAB — MAGNESIUM: Magnesium: 1.8 mg/dL (ref 1.7–2.4)

## 2020-07-26 MED ORDER — ZOLEDRONIC ACID 4 MG/100ML IV SOLN
INTRAVENOUS | Status: AC
  Filled 2020-07-26: qty 100

## 2020-07-26 MED ORDER — PROCHLORPERAZINE EDISYLATE 10 MG/2ML IJ SOLN
10.0000 mg | Freq: Once | INTRAMUSCULAR | Status: AC
  Administered 2020-07-26: 10 mg via INTRAVENOUS

## 2020-07-26 MED ORDER — FLUOROURACIL CHEMO INJECTION 2.5 GM/50ML
400.0000 mg/m2 | Freq: Once | INTRAVENOUS | Status: AC
  Administered 2020-07-26: 850 mg via INTRAVENOUS
  Filled 2020-07-26: qty 17

## 2020-07-26 MED ORDER — PALONOSETRON HCL INJECTION 0.25 MG/5ML
0.2500 mg | Freq: Once | INTRAVENOUS | Status: AC
  Administered 2020-07-26: 0.25 mg via INTRAVENOUS
  Filled 2020-07-26: qty 5

## 2020-07-26 MED ORDER — SODIUM CHLORIDE 0.9 % IV SOLN
400.0000 mg/m2 | Freq: Once | INTRAVENOUS | Status: AC
  Administered 2020-07-26: 868 mg via INTRAVENOUS
  Filled 2020-07-26: qty 43.4

## 2020-07-26 MED ORDER — PROCHLORPERAZINE EDISYLATE 10 MG/2ML IJ SOLN
INTRAMUSCULAR | Status: AC
  Filled 2020-07-26: qty 2

## 2020-07-26 MED ORDER — MISC. DEVICES MISC: 0 refills | Status: AC

## 2020-07-26 MED ORDER — ZOLEDRONIC ACID 4 MG/100ML IV SOLN
4.0000 mg | Freq: Once | INTRAVENOUS | Status: AC
  Administered 2020-07-26: 4 mg via INTRAVENOUS

## 2020-07-26 MED ORDER — SODIUM CHLORIDE 0.9 % IV SOLN: Freq: Once | INTRAVENOUS | Status: AC

## 2020-07-26 MED ORDER — SODIUM CHLORIDE 0.9 % IV SOLN
10.0000 mg | Freq: Once | INTRAVENOUS | Status: AC
  Administered 2020-07-26: 10 mg via INTRAVENOUS
  Filled 2020-07-26: qty 10

## 2020-07-26 MED ORDER — SODIUM CHLORIDE 0.9% FLUSH: 10.0000 mL | INTRAVENOUS | Status: DC | PRN

## 2020-07-26 MED ORDER — SODIUM CHLORIDE 0.9 % IV SOLN
2400.0000 mg/m2 | INTRAVENOUS | Status: DC
  Administered 2020-07-26: 5200 mg via INTRAVENOUS
  Filled 2020-07-26: qty 100

## 2020-07-26 MED ORDER — SODIUM CHLORIDE 0.9 % IV SOLN
240.0000 mg | Freq: Once | INTRAVENOUS | Status: AC
  Administered 2020-07-26: 240 mg via INTRAVENOUS
  Filled 2020-07-26: qty 24

## 2020-07-26 NOTE — Telephone Encounter (Addendum)
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  Patient receiving chemotherapy.  Patient with new vocal cord paralysis, seen at [facility] and planning procedure on 9/16.    Received call from wife as patient with hoarse voice due to paralysis.  Patient has started using feeding tube consistently due to paralysis and trouble swallowing. Patient only able to eat about 1 meal per day and never knows which meal his is able to eat.  Making shakes and drinking orally.    Medications: reviewed  Labs: glucose 195  Anthropometrics:   Weight 202 lb today increased from 195 lb on 8/13  Weight increased today as using feeding tube more  212 lb-218 lb stable weight  Estimated Energy Needs  Kcals: 5520-8022 Protein: 120-140 g Fluid: > 2.4 L  NUTRITION DIAGNOSIS: Inadequate oral intake continues   INTERVENTION:  Recommend restarting tube feeding secondary to limited oral intake from vocal cord paralysis.  Discussed option of trying glucerna 1.5 (issues with elevated blood glucose previously) or staying with osmolite 1.5.  Patient wants to continue with osmolite 1.5.  Recommend osmolite 1.5, 8 cartons per day bolus tube feeding (2 cartons QID). Flush with 20ml of water before and after each feeding. Will need to drink or give via tube additional 240 QID ml water daily between feedings to hydration.  Tube feeding will provide 2840 calories, 119 g protein and 2400 ml free water.  This meets 100% of calorie needs and 99% protein needs.   RD contacted [facility] representative regarding order for formula and supplies.   Patient and wife have RD contact information    MONITORING, EVALUATION, GOAL: weight trends, intake, tube feeding   NEXT VISIT: 8/27 phone f/u  Cerra Eisenhower B. Zenia Resides, [place], [place] Registered Dietitian [phone] (mobile)

## 2020-07-26 NOTE — Progress Notes (Signed)
Labs reviewed at office visit today. Will proceed with all treatment today except no oxaliplatin per MD.   Patient complaining of nausea still, MD notified, will give compazine per orders.   Treatment given per orders. Patient tolerated it well without problems. Vitals stable and discharged home from clinic ambulatory. Follow up as scheduled.

## 2020-07-26 NOTE — Progress Notes (Signed)
Orders placed for tube feedings per Jennet Maduro, dietician.  Orders faxed to [facility].

## 2020-07-26 NOTE — Progress Notes (Signed)
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[place])   12/29/2019 Cancer Staging   Staging form: Esophagus - Adenocarcinoma, AJCC 8th Edition - Clinical stage from 12/29/2019: Stage IVB (cTX, cN3, cM1) - Signed by Derek Jack, MD on 12/29/2019   12/31/2019 -  Chemotherapy   The patient had palonosetron (ALOXI) injection 0.25 mg, 0.25 mg, Intravenous,  Once, 13 of 16 cycles Administration: 0.25 mg (12/31/2019), 0.25 mg (01/14/2020), 0.25 mg (01/28/2020), 0.25 mg (02/11/2020), 0.25 mg (02/25/2020), 0.25 mg (03/10/2020), 0.25 mg (03/24/2020), 0.25 mg (04/12/2020), 0.25 mg (04/26/2020), 0.25 mg (05/10/2020), 0.25 mg (05/24/2020), 0.25 mg (06/28/2020), 0.25 mg (07/12/2020) leucovorin 868 mg in dextrose 5 % 250 mL infusion, 400 mg/m2 = 868 mg, Intravenous,  Once, 13 of 16 cycles Administration: 868 mg (12/31/2019), 868 mg (01/14/2020), 868 mg (01/28/2020), 868 mg (02/11/2020), 868 mg (02/25/2020), 868 mg (03/10/2020), 868 mg (03/24/2020), 868 mg (04/12/2020), 868 mg (04/26/2020), 868 mg (05/10/2020), 868 mg (05/24/2020), 868 mg (06/28/2020), 868 mg (07/12/2020) oxaliplatin (ELOXATIN) 150 mg in dextrose 5 % 500 mL chemo infusion, 68 mg/m2 = 150 mg (80 % of original dose 85 mg/m2), Intravenous,  Once, 13 of 16 cycles Dose modification: 68 mg/m2 (80 % of original dose 85 mg/m2, Cycle 1, Reason: Other (see comments), Comment: diabetic neuropathy) Administration: 150 mg (12/31/2019), 150 mg  (01/14/2020), 150 mg (01/28/2020), 150 mg (02/11/2020), 150 mg (02/25/2020), 150 mg (03/10/2020), 150 mg (03/24/2020), 150 mg (04/12/2020), 150 mg (04/26/2020), 150 mg (05/10/2020), 150 mg (05/24/2020), 150 mg (06/28/2020), 150 mg (07/12/2020) fluorouracil (ADRUCIL) chemo injection 850 mg, 400 mg/m2 = 850 mg, Intravenous,  Once, 13 of 16 cycles Administration: 850 mg (12/31/2019), 850 mg (01/14/2020), 850 mg (01/28/2020), 850 mg (02/11/2020), 850 mg (02/25/2020), 850 mg (03/10/2020), 850 mg (03/24/2020), 850 mg (04/12/2020), 850 mg (04/26/2020), 850 mg (05/10/2020), 850 mg (05/24/2020), 850 mg (06/28/2020) fluorouracil (ADRUCIL) 5,000 mg in sodium chloride 0.9 % 150 mL chemo infusion, 2,310 mg/m2 = 5,200 mg, Intravenous, 1 Day/Dose, 13 of 16 cycles Administration: 5,000 mg (12/31/2019), 5,000 mg (01/14/2020), 5,000 mg (01/28/2020), 5,000 mg (02/11/2020), 5,000 mg (02/25/2020), 5,000 mg (03/10/2020), 5,000 mg (03/24/2020), 5,000 mg (04/12/2020), 5,000 mg (04/26/2020), 5,000 mg (05/10/2020), 5,000 mg (05/24/2020), 5,200 mg (06/28/2020) nivolumab (OPDIVO) 240 mg in sodium chloride 0.9 % 100 mL chemo infusion, 240 mg (100 % of original dose 240 mg), Intravenous, Once, 13 of 16 cycles Dose modification: 240 mg (original dose 240 mg, Cycle 1, Reason: Provider Judgment) Administration: 240 mg (12/31/2019), 240 mg (01/14/2020), 240 mg (01/28/2020), 240 mg (02/11/2020), 240 mg (02/25/2020), 240 mg (03/10/2020), 240 mg (03/24/2020), 240 mg (04/12/2020), 240 mg (04/26/2020), 240 mg (05/10/2020), 240 mg (05/24/2020), 240 mg (06/28/2020), 240 mg (07/12/2020)  for chemotherapy treatment.      CANCER STAGING: Cancer Staging Adenocarcinoma of gastroesophageal junction [facility]) Staging form: Esophagus - Adenocarcinoma, AJCC 8th Edition - Clinical stage from 12/29/2019: Stage IVB (cTX, cN3, cM1) - Signed by Derek Jack, MD on 12/29/2019   INTERVAL HISTORY:  [name], a 69 y.o. male, returns for routine follow-up and consideration for next cycle of  chemotherapy. Timothy Oconnor was last seen on 07/16/2020.  Due for cycle #14 of FOLFOX today.   Overall, he tells me he has been feeling okay. He reports getting a fever after his last treatment, but it subsided before the next day, and afterwards he felt fatigued, which he still complains of.  He is taking in 6-8 cans of Osmolite daily through his PEG tube and not regurgitating it; he is not eating anything by mouth since he vomits it back up. He denies having any jaw pain.  He had an ENT visit at [facility] on 07/07/2020 with Dr. Denyse Dago. He is having laryngoplasty on 9/16 at [facility].  Overall, he feels ready for next cycle of chemo today.     REVIEW OF SYSTEMS:  Review of Systems  Constitutional: Positive for fatigue (severe).  HENT:   Positive for trouble swallowing.   Cardiovascular: Positive for palpitations. Negative for leg swelling.  Gastrointestinal: Positive for diarrhea (intermittent) and nausea.  All other systems reviewed and are negative.   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[place])    Anxiety    Diabetes ([place])    Heart attack ([place])    Heart disease    High cholesterol    Hyperplasia of prostate    Hypertension    Low back pain    Port-A-Cath in place 12/30/2019   Tremor    Past Surgical History:  Procedure Laterality Date   arterectomy     1993   BIOPSY  12/01/2019   Procedure: BIOPSY;  Surgeon: Rogene Houston, MD;  Location: [facility];  Service: Endoscopy;;  esophagus   ESOPHAGOGASTRODUODENOSCOPY (EGD) WITH PROPOFOL N/A 12/01/2019   Procedure: ESOPHAGOGASTRODUODENOSCOPY (EGD) WITH PROPOFOL;  Surgeon: Rogene Houston, MD;  Location: [facility] ENDO SU[facility];  Service: Endoscopy;  Laterality: N/A;   ESOPHAGOGASTRODUODENOSCOPY (EGD) WITH PROPOFOL N/A 12/24/2019   Procedure: ESOPHAGOGASTRODUODENOSCOPY (EGD) WITH PROPOFOL;  Surgeon: Aviva Signs, MD;  Location: [facility] ORS;  Service: General;   Laterality: N/A;   Heart stents     2002, 2004, 2006   PEG PLACEMENT N/A 12/24/2019   Procedure: PERCUTANEOUS ENDOSCOPIC GASTROSTOMY (PEG) PLACEMENT;  Surgeon: Aviva Signs, MD;  Location: [facility] ORS;  Service: General;  Laterality: N/A;   PORTACATH PLACEMENT Left 12/24/2019   Procedure: INSERTION PORT-A-CATH;  Surgeon: Aviva Signs, MD;  Location: [facility] ORS;  Service: General;  Laterality: Left;    SOCIAL HISTORY:  Social History   Socioeconomic History   Marital status: Married    Spouse name: Not on file   Number of children: 2   Years of education: 16   Highest education level: Not on file  Occupational History   Occupation: [employer]    Comment: Delivers medication  Tobacco Use   Smoking status: Former Smoker    Packs/day: 1.00    Years: 50.00    Pack years: 50.00    Quit date: 12/21/2017    Years since quitting: 2.5   Smokeless tobacco: Never Used   Tobacco comment: Quit 2017  Vaping Use   Vaping Use: Never used  Substance and Sexual Activity   Alcohol use: Yes    Alcohol/week: 0.0 standard drinks    Comment: 3 drinks per week   Drug use: No   Sexual activity: Yes  Other Topics Concern   Not on file  Social History Narrative   Lives  at home with his wife.   Right-handed.   2 diet cokes per day.   Social Determinants of Health   Financial Resource Strain: Low Risk    Difficulty of Paying Living Expenses: Not hard at all  Food Insecurity: No Food Insecurity   Worried About Charity fundraiser in the Last Year: Never true   [place] in the Last Year: Never true  Transportation Needs: No Transportation Needs   Lack of Transportation (Medical): No   Lack of Transportation (Non-Medical): No  Physical Activity: Insufficiently Active   Days of Exercise per Week: 2 days   Minutes of Exercise per Session: 30 min  Stress: No Stress Concern Present   Feeling of Stress : Only a little  Social Connections: Engineer, building services of  Communication with Friends and Family: More than three times a week   Frequency of Social Gatherings with Friends and Family: More than three times a week   Attends Religious Services: 1 to 4 times per year   Active Member of [employer] or Organizations: No   Attends Music therapist: 1 to 4 times per year   Marital Status: Married  Human resources officer Violence: Not At Risk   Fear of Current or Ex-Partner: No   Emotionally Abused: No   Physically Abused: No   Sexually Abused: No    FAMILY HISTORY:  Family History  Problem Relation Age of Onset   Alzheimer's disease Mother    Diabetes Father    Heart disease Father    Stroke Father    Heart disease Brother    Colon cancer Brother    Alzheimer's disease Sister     CURRENT MEDICATIONS:  Current Outpatient Medications  Medication Sig Dispense Refill   ALPRAZolam (XANAX) 0.5 MG tablet Take 1 tablet (0.5 mg total) by mouth 2 (two) times daily as needed for anxiety. 30 tablet 0   amoxicillin-clavulanate (AUGMENTIN) 875-125 MG tablet Take 1 tablet by mouth 2 (two) times daily. 14 tablet 0   ascorbic acid (VITAMIN C) 500 MG tablet Take 500 mg by mouth 2 (two) times a week.      aspirin EC 81 MG tablet Take 81 mg by mouth daily.      atorvastatin (LIPITOR) 80 MG tablet Take 80 mg by mouth every evening.      calcium-vitamin D (OSCAL WITH D) 500-200 MG-UNIT tablet Take 2 tablets by mouth 2 (two) times daily.     co-enzyme Q-10 30 MG capsule Take 30 mg by mouth daily.     ezetimibe (ZETIA) 10 MG tablet Take 10 mg by mouth daily.      FLUOROURACIL IV Inject into the vein every 14 (fourteen) days.     folic acid (FOLVITE) 638 MCG tablet Take 800 mcg by mouth daily.      HYDROcodone-acetaminophen (HYCET) 7.5-325 mg/15 ml solution Take 15 mLs by mouth every 4 (four) hours as needed for moderate pain. 120 mL 0   isosorbide mononitrate (IMDUR) 60 MG 24 hr tablet Take 60 mg by mouth daily.     LEUCOVORIN CALCIUM  IV Inject into the vein every 14 (fourteen) days.     lidocaine-prilocaine (EMLA) cream Apply a dime-sized amount to port a cath site and cover with plastic wrap 1 hour prior to chemotherapy appointments 30 g 3   losartan (COZAAR) 50 MG tablet Take 25 mg by mouth daily. (Patient not taking: Reported on 07/26/2020)     metFORMIN (GLUCOPHAGE-XR) 500 MG  24 hr tablet Take 2,000 mg by mouth every morning.     metoprolol tartrate (LOPRESSOR) 25 MG tablet Take 0.5 tablets (12.5 mg total) by mouth 2 (two) times daily. 60 tablet 1   Multiple Vitamin (MULTI-VITAMINS) TABS Take 1 tablet by mouth daily.      NIVOLUMAB IV Inject into the vein every 14 (fourteen) days.     OXALIPLATIN IV Inject into the vein every 14 (fourteen) days.     Oxycodone HCl 10 MG TABS Take 1 tablet (10 mg total) by mouth at bedtime as needed. 30 tablet 0   prochlorperazine (COMPAZINE) 10 MG tablet Take 1 tablet (10 mg total) by mouth every 6 (six) hours as needed (Nausea or vomiting). 30 tablet 1   tamsulosin (FLOMAX) 0.4 MG CAPS capsule Take 0.4 mg by mouth daily.      No current facility-administered medications for this visit.    ALLERGIES:  No Known Allergies  PHYSICAL EXAM:  Performance status (ECOG): 1 - Symptomatic but completely ambulatory  Vitals:   07/26/20 0822  BP: 95/61  Pulse: 91  Resp: 18  Temp: (!) 97 F (36.1 C)  SpO2: 96%   Wt Readings from Last 3 Encounters:  07/26/20 202 lb 12.8 oz (92 kg)  07/16/20 195 lb 9.6 oz (88.7 kg)  07/12/20 202 lb 1.6 oz (91.7 kg)   Physical Exam Vitals reviewed.  Constitutional:      Appearance: Normal appearance.  Cardiovascular:     Rate and Rhythm: Normal rate and regular rhythm.     Pulses: Normal pulses.     Heart sounds: Normal heart sounds.  Pulmonary:     Effort: Pulmonary effort is normal.     Breath sounds: Normal breath sounds.  Chest:     Comments: Port-a-Cath in L chest Abdominal:     Comments: PEG tube in place  Musculoskeletal:      Right lower leg: No edema.     Left lower leg: No edema.  Neurological:     General: No focal deficit present.     Mental Status: He is alert and oriented to person, place, and time.  Psychiatric:        Mood and Affect: Mood normal.        Behavior: Behavior normal.     LABORATORY DATA:  I have reviewed the labs as listed.  CBC Latest Ref Rng & Units 07/26/2020 07/16/2020 07/12/2020  WBC 4.0 - 10.5 K/uL 9.7 5.6 6.1  Hemoglobin 13.0 - 17.0 g/dL 11.5(L) 10.5(L) 10.2(L)  Hematocrit 39 - 52 % 35.1(L) 32.8(L) 31.3(L)  Platelets 150 - 400 K/uL 385 346 265   CMP Latest Ref Rng & Units 07/26/2020 07/16/2020 07/12/2020  Glucose 70 - 99 mg/dL 195(H) 141(H) 228(H)  BUN 8 - 23 mg/dL _0 Creatinine 0.61 - 1.24 mg/dL 0.88 0.74 0.81  Sodium 135 - 145 mmol/L 132(L) 135 135  Potassium 3.5 - 5.1 mmol/L 4.4 4.3 3.9  Chloride 98 - 111 mmol/L 95(L) 101 99  CO2 22 - 32 mmol/L _1 Calcium 8.9 - 10.3 mg/dL 9.1 9.8 8.9  Total Protein 6.5 - 8.1 g/dL 7.0 7.2 6.8  Total Bilirubin 0.3 - 1.2 mg/dL 0.5 0.5 0.4  Alkaline Phos 38 - 126 U/L 51 60 56  AST 15 - 41 U/L 26 46(H) 26  ALT 0 - 44 U/L 30 47(H) 30   Lab Results  Component Value Date   LDH 116 07/26/2020   LDH 196 (H) 07/16/2020  LDH 107 06/28/2020   Lab Results  Component Value Date   CEA1 3.1 06/28/2020   CEA1 3.1 06/14/2020   CEA1 2.7 05/10/2020    DIAGNOSTIC IMAGING:  I have independently reviewed the scans and discussed with the patient. No results found.   ASSESSMENT:  1. 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 HER-2 FISH to be followed up. PD-L1 CPS-5%. -FOLFOX and Opdivo started on 12/31/2019. -PET scan on 03/22/2020 showed marked reduction in size and metabolic activity in the distal esophageal lesion, marked reduction in size of mediastinal adenopathy. Near complete resolution with only 2 small mildly metabolic mediastinal lymph nodes remaining. Resolution of lymph nodes in the upper  abdomen. Right hip bone lesion is still present but better. -CT CAP on 06/09/2020 showed interval resolution of previously demonstrated tree-in-bud nodularity in the lungs. Stable enlarged subcarinal lymph node and small residual lymph nodes with the gastrohepatic ligament and porta hepatis unchanged from the most recent PET scan.   PLAN:  1. Stage IV GE junction adenocarcinoma: -He tolerated last chemotherapy on July 12, 2020 reasonably well.  However he developed rigors when he was receiving oxaliplatin.  His cultures came back negative. -He has regained his weight.  I reviewed his labs today which showed normal LFTs and CBC. -I will start him back on his chemotherapy today.  He will receive 5-FU and Opdivo.  I will hold his oxaliplatin.  We will reevaluate him in 2 weeks.  2. 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 -He will see them back on August 19, 2020 for injection into the vocal cord.  He will also have FEES study.  3.  Weight loss: -He is taking in 6 cans of Osmolite through the feeding tube.  He has gained back his weight. -He still has dysphagia.  He is regurgitating food after 6-7 bites. -I have talked to Dr. Melony Overly and requested endoscopy evaluation.  4. Bone metastasis: -Continue Zometa monthly.  5. Diabetes: -Continue Metformin.  6. Hypertension: -Continue Lopressor 25 mg daily.  7.Anxiety: -Continue Xanax 0.5 mg as needed.   Orders placed this encounter:  No orders of the defined types were placed in this encounter.    Derek Jack, MD [place] [phone]   I, Milinda Antis, am acting as a scribe for Dr. Sanda Linger.  I, Derek Jack MD, have reviewed the above documentation for accuracy and completeness, and I agree with the above.

## 2020-07-26 NOTE — Patient Instructions (Signed)
[facility] Discharge Instructions for Patients Receiving Chemotherapy  Today you received the following chemotherapy agents   To help prevent nausea and vomiting after your treatment, we encourage you to take your nausea medication   If you develop nausea and vomiting that is not controlled by your nausea medication, call the clinic.   BELOW ARE SYMPTOMS THAT SHOULD BE REPORTED IMMEDIATELY:  *FEVER GREATER THAN 100.5 F  *CHILLS WITH OR WITHOUT FEVER  NAUSEA AND VOMITING THAT IS NOT CONTROLLED WITH YOUR NAUSEA MEDICATION  *UNUSUAL SHORTNESS OF BREATH  *UNUSUAL BRUISING OR BLEEDING  TENDERNESS IN MOUTH AND THROAT WITH OR WITHOUT PRESENCE OF ULCERS  *URINARY PROBLEMS  *BOWEL PROBLEMS  UNUSUAL RASH Items with * indicate a potential emergency and should be followed up as soon as possible.  Feel free to call the clinic should you have any questions or concerns. The clinic phone number is [phone].  Please show the CHEMO ALERT CARD at check-in to the Emergency Department and triage nurse.   

## 2020-07-26 NOTE — Patient Instructions (Signed)
[place] at [facility] Discharge Instructions  You were seen today by Dr. Delton Coombes. He went over your recent results. You received your treatment today. You will be scheduled for another EGD with Dr. Laural Golden. Dr. Delton Coombes will see you back in 2 weeks for labs and follow up.   Thank you for choosing [place] at [facility] to provide your oncology and hematology care.  To afford each patient quality time with our provider, please arrive at least 15 minutes before your scheduled appointment time.   If you have a lab appointment with the [place] please come in thru the Main Entrance and check in at the main information desk  You need to re-schedule your appointment should you arrive 10 or more minutes late.  We strive to give you quality time with our providers, and arriving late affects you and other patients whose appointments are after yours.  Also, if you no show three or more times for appointments you may be dismissed from the clinic at the providers discretion.     Again, thank you for choosing [facility].  Our hope is that these requests will decrease the amount of time that you wait before being seen by our physicians.       _____________________________________________________________  Should you have questions after your visit to [facility], please contact our office at [phone] between the hours of 8:00 a.m. and 4:30 p.m.  Voicemails left after 4:00 p.m. will not be returned until the following business day.  For prescription refill requests, have your pharmacy contact our office and allow 72 hours.    Cancer Center Support Programs:   > Cancer Support Group  2nd Tuesday of the month 1pm-2pm, Journey Room

## 2020-07-26 NOTE — Progress Notes (Signed)
Patient has been assessed by Dr. Delton Coombes and labs reviewed. Patient will not be getting oxaliplatin. He is okay to proceed with the remaining.

## 2020-07-28 ENCOUNTER — Encounter (HOSPITAL_COMMUNITY): Payer: Self-pay

## 2020-07-28 ENCOUNTER — Other Ambulatory Visit: Payer: Self-pay

## 2020-07-28 ENCOUNTER — Inpatient Hospital Stay (HOSPITAL_COMMUNITY): Payer: Medicare HMO

## 2020-07-28 VITALS — BP 91/58 | HR 91 | Temp 97.9°F | Resp 16

## 2020-07-28 DIAGNOSIS — Z95828 Presence of other vascular implants and grafts: Secondary | ICD-10-CM

## 2020-07-28 DIAGNOSIS — C160 Malignant neoplasm of cardia: Secondary | ICD-10-CM

## 2020-07-28 DIAGNOSIS — Z5111 Encounter for antineoplastic chemotherapy: Secondary | ICD-10-CM | POA: Diagnosis not present

## 2020-07-28 MED ORDER — SODIUM CHLORIDE 0.9% FLUSH
10.0000 mL | INTRAVENOUS | Status: DC | PRN
  Administered 2020-07-28: 10 mL

## 2020-07-28 MED ORDER — HEPARIN SOD (PORK) LOCK FLUSH 100 UNIT/ML IV SOLN
500.0000 [IU] | Freq: Once | INTRAVENOUS | Status: AC | PRN
  Administered 2020-07-28: 500 [IU]

## 2020-07-28 NOTE — Progress Notes (Signed)
Patients port flushed without difficulty and pump d/c.  Good blood return noted with no bruising or swelling noted at site.  Band aid applied.  VSS with discharge and left ambulatory with no s/s of distress noted

## 2020-07-30 ENCOUNTER — Ambulatory Visit (HOSPITAL_COMMUNITY): Payer: Medicare HMO

## 2020-07-30 NOTE — Progress Notes (Signed)
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  Patient receiving folfox and nivolumab.    Spoke with patient via phone for nutrition follow-up.  Wife reports receiving tube feeding formula and supplies this week.  Wife reports that patient is taking in very little due to swallowing. Reports having issues with nausea and vomited yesterday.  Procedure at [facility] planned for 9/16.  Wife reports that patient is taking at least 6 cartons per day of osmolite 1.5 via feeding tube.  Having some problems swallowing medications.      Medications: compazine  Labs: reviewed  Anthropometrics:   Weight 202 lb 12.8 oz on 8/23 increased from 195 lb 9.6 oz.     Estimated Energy Needs  Kcals: 6301-6010 Protein: 120-140 g Fluid: > 2.4 L  NUTRITION DIAGNOSIS: Inadequate oral intake continues   INTERVENTION:  Continue osmolite 1.5 6-8 cartons per day via feeding tube.  Recommend slowing administration of tube feeding down, 1 carton should take 10-15 minutes to infuse to help with nausea.   Encouraged nausea medications Encouraged wife to call pharmacy to find out which medications can be crushed and put through feeding tube.  Reviewed that medications should be dissolved in warm water completely.  Flush with 30-19ml water first, then give medication and water slurry and flush 30-55ml after.   Wife has contact information    MONITORING, EVALUATION, GOAL: weight trends, intake, TF   NEXT VISIT: Sept 10, in clinic  [place]. Zenia Resides, [place], [place] Registered Dietitian [phone] (mobile)

## 2020-08-03 ENCOUNTER — Encounter (INDEPENDENT_AMBULATORY_CARE_PROVIDER_SITE_OTHER): Payer: Self-pay | Admitting: Internal Medicine

## 2020-08-03 ENCOUNTER — Other Ambulatory Visit: Payer: Self-pay

## 2020-08-03 ENCOUNTER — Emergency Department (HOSPITAL_COMMUNITY)
Admission: EM | Admit: 2020-08-03 | Discharge: 2020-08-03 | Disposition: A | Discharge status: Home / Self Care | Payer: Medicare HMO | Attending: Emergency Medicine | Admitting: Emergency Medicine

## 2020-08-03 ENCOUNTER — Ambulatory Visit (INDEPENDENT_AMBULATORY_CARE_PROVIDER_SITE_OTHER): Payer: Medicare HMO | Admitting: Internal Medicine

## 2020-08-03 ENCOUNTER — Emergency Department (HOSPITAL_COMMUNITY): Payer: Medicare HMO

## 2020-08-03 ENCOUNTER — Encounter (HOSPITAL_COMMUNITY): Payer: Self-pay | Admitting: *Deleted

## 2020-08-03 VITALS — BP 113/81 | HR 85 | Temp 97.6°F | Ht 70.0 in | Wt 199.8 lb

## 2020-08-03 DIAGNOSIS — I10 Essential (primary) hypertension: Secondary | ICD-10-CM | POA: Insufficient documentation

## 2020-08-03 DIAGNOSIS — Z79899 Other long term (current) drug therapy: Secondary | ICD-10-CM | POA: Insufficient documentation

## 2020-08-03 DIAGNOSIS — E860 Dehydration: Secondary | ICD-10-CM

## 2020-08-03 DIAGNOSIS — Z7982 Long term (current) use of aspirin: Secondary | ICD-10-CM | POA: Insufficient documentation

## 2020-08-03 DIAGNOSIS — R109 Unspecified abdominal pain: Secondary | ICD-10-CM | POA: Diagnosis present

## 2020-08-03 DIAGNOSIS — Z87891 Personal history of nicotine dependence: Secondary | ICD-10-CM | POA: Diagnosis not present

## 2020-08-03 DIAGNOSIS — Z7984 Long term (current) use of oral hypoglycemic drugs: Secondary | ICD-10-CM | POA: Diagnosis not present

## 2020-08-03 DIAGNOSIS — R1084 Generalized abdominal pain: Secondary | ICD-10-CM | POA: Insufficient documentation

## 2020-08-03 DIAGNOSIS — K9423 Gastrostomy malfunction: Secondary | ICD-10-CM | POA: Insufficient documentation

## 2020-08-03 DIAGNOSIS — C160 Malignant neoplasm of cardia: Secondary | ICD-10-CM | POA: Diagnosis not present

## 2020-08-03 DIAGNOSIS — R140 Abdominal distension (gaseous): Secondary | ICD-10-CM

## 2020-08-03 DIAGNOSIS — E119 Type 2 diabetes mellitus without complications: Secondary | ICD-10-CM | POA: Insufficient documentation

## 2020-08-03 LAB — CBC WITH DIFFERENTIAL/PLATELET
Abs Immature Granulocytes: 0.04 10*3/uL (ref 0.00–0.07)
Basophils Absolute: 0 10*3/uL (ref 0.0–0.1)
Basophils Relative: 1 %
Eosinophils Absolute: 0 10*3/uL (ref 0.0–0.5)
Eosinophils Relative: 0 %
HCT: 38 % — ABNORMAL LOW (ref 39.0–52.0)
Hemoglobin: 12.4 g/dL — ABNORMAL LOW (ref 13.0–17.0)
Immature Granulocytes: 1 %
Lymphocytes Relative: 32 %
Lymphs Abs: 2.3 10*3/uL (ref 0.7–4.0)
MCH: 31.2 pg (ref 26.0–34.0)
MCHC: 32.6 g/dL (ref 30.0–36.0)
MCV: 95.7 fL (ref 80.0–100.0)
Monocytes Absolute: 0.7 10*3/uL (ref 0.1–1.0)
Monocytes Relative: 10 %
Neutro Abs: 4 10*3/uL (ref 1.7–7.7)
Neutrophils Relative %: 56 %
Platelets: 411 10*3/uL — ABNORMAL HIGH (ref 150–400)
RBC: 3.97 MIL/uL — ABNORMAL LOW (ref 4.22–5.81)
RDW: 14.4 % (ref 11.5–15.5)
WBC: 7.2 10*3/uL (ref 4.0–10.5)
nRBC: 0 % (ref 0.0–0.2)

## 2020-08-03 LAB — COMPREHENSIVE METABOLIC PANEL
ALT: 33 U/L (ref 0–44)
AST: 31 U/L (ref 15–41)
Albumin: 3.6 g/dL (ref 3.5–5.0)
Alkaline Phosphatase: 48 U/L (ref 38–126)
Anion gap: 15 (ref 5–15)
BUN: 11 mg/dL (ref 8–23)
CO2: 23 mmol/L (ref 22–32)
Calcium: 8.6 mg/dL — ABNORMAL LOW (ref 8.9–10.3)
Chloride: 95 mmol/L — ABNORMAL LOW (ref 98–111)
Creatinine, Ser: 0.93 mg/dL (ref 0.61–1.24)
GFR calc Af Amer: >60 mL/min (ref >60)
GFR calc non Af Amer: >60 mL/min (ref >60)
Glucose, Bld: 116 mg/dL — ABNORMAL HIGH (ref 70–99)
Potassium: 4 mmol/L (ref 3.5–5.1)
Sodium: 133 mmol/L — ABNORMAL LOW (ref 135–145)
Total Bilirubin: 0.6 mg/dL (ref 0.3–1.2)
Total Protein: 8 g/dL (ref 6.5–8.1)

## 2020-08-03 LAB — LIPASE, BLOOD: Lipase: 31 U/L (ref 11–51)

## 2020-08-03 MED ORDER — ONDANSETRON HCL 4 MG/2ML IJ SOLN
4.0000 mg | Freq: Once | INTRAMUSCULAR | Status: AC
  Administered 2020-08-03: 4 mg via INTRAVENOUS
  Filled 2020-08-03: qty 2

## 2020-08-03 MED ORDER — SODIUM CHLORIDE 0.9 % IV BOLUS
1000.0000 mL | Freq: Once | INTRAVENOUS | Status: AC
  Administered 2020-08-03: 1000 mL via INTRAVENOUS

## 2020-08-03 MED ORDER — IOHEXOL 300 MG/ML  SOLN
100.0000 mL | Freq: Once | INTRAMUSCULAR | Status: AC | PRN
  Administered 2020-08-03: 100 mL via INTRAVENOUS

## 2020-08-03 NOTE — Patient Instructions (Addendum)
Please proceed to ER . Dr. Dewayne Hatch is aware.

## 2020-08-03 NOTE — ED Provider Notes (Signed)
[facility] EMERGENCY DEPARTMENT Provider Note   CSN: [id number] Arrival date & time: 08/03/20  1638     History Chief Complaint  Patient presents with  . Abdominal Pain    Timothy Oconnor is a 69 y.o. male.  Patient complains of some abdominal distention.  Patient was seen by his GI doctor today and he was sent over he has a G tube that he states was sending his nutrition back up to his esophagus but the last couple times he used just water and it he did not have any problems.  The history is provided by the patient and medical records. No language interpreter was used.  Abdominal Pain Pain location:  Generalized Pain quality: aching   Pain radiates to:  Does not radiate Pain severity:  Mild Onset quality:  Gradual Timing:  Constant Progression:  Waxing and waning Chronicity:  Recurrent Context: not alcohol use   Relieved by:  Nothing Associated symptoms: no chest pain, no cough, no diarrhea, no fatigue and no hematuria        Past Medical History:  Diagnosis Date  . 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 Anxiety   . Diabetes ([place])   . Heart attack ([place])   . Heart disease   . High cholesterol   . Hyperplasia of prostate   . Hypertension   . Low back pain   . Port-A-Cath in place 12/30/2019  . Tremor     Patient Active Problem List   Diagnosis Date Noted  . Abdominal distension 08/03/2020  . PEG tube malfunction ([place]) 08/03/2020  . Bone metastases ([place]) 03/24/2020  . PEG (percutaneous endoscopic gastrostomy) status ([place]) 01/08/2020  . Port-A-Cath in place 12/30/2019  . Goals of care, counseling/discussion 12/29/2019  . 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 Esophageal cancer ([place])   . Dysphagia 11/30/2019  . HTN (hypertension) 11/30/2019  . Type 2 diabetes mellitus without complication ([place]) 41/93/7902  . HLD (hyperlipidemia) 11/30/2019  . BPH (benign prostatic hyperplasia) 11/30/2019  . Tremor 12/08/2015    Past Surgical  History:  Procedure Laterality Date  . arterectomy     1993  . BIOPSY  12/01/2019   Procedure: BIOPSY;  Surgeon: Rogene Houston, MD;  Location: [facility];  Service: Endoscopy;;  esophagus  . ESOPHAGOGASTRODUODENOSCOPY (EGD) WITH PROPOFOL N/A 12/01/2019   Procedure: ESOPHAGOGASTRODUODENOSCOPY (EGD) WITH PROPOFOL;  Surgeon: Rogene Houston, MD;  Location: [facility];  Service: Endoscopy;  Laterality: N/A;  . ESOPHAGOGASTRODUODENOSCOPY (EGD) WITH PROPOFOL N/A 12/24/2019   Procedure: ESOPHAGOGASTRODUODENOSCOPY (EGD) WITH PROPOFOL;  Surgeon: Aviva Signs, MD;  Location: [facility] ORS;  Service: General;  Laterality: N/A;  . Heart stents     2002, 2004, 2006  . PEG PLACEMENT N/A 12/24/2019   Procedure: PERCUTANEOUS ENDOSCOPIC GASTROSTOMY (PEG) PLACEMENT;  Surgeon: Aviva Signs, MD;  Location: [facility] ORS;  Service: General;  Laterality: N/A;  . PORTACATH PLACEMENT Left 12/24/2019   Procedure: INSERTION PORT-A-CATH;  Surgeon: Aviva Signs, MD;  Location: [facility] ORS;  Service: General;  Laterality: Left;       Family History  Problem Relation Age of Onset  . Alzheimer's disease Mother   . Diabetes Father   . Heart disease Father   . Stroke Father   . Heart disease Brother   . Colon cancer Brother   . Alzheimer's disease Sister     Social History   Tobacco Use  . Smoking status: Former Smoker    Packs/day: 1.00    Years: 50.00  Pack years: 50.00    Quit date: 12/21/2017    Years since quitting: 2.6  . Smokeless tobacco: Never Used  . Tobacco comment: Quit 2017  Vaping Use  . Vaping Use: Never used  Substance Use Topics  . Alcohol use: Yes    Alcohol/week: 0.0 standard drinks    Comment: 3 drinks per week  . Drug use: No    Home Medications Prior to Admission medications   Medication Sig Start Date End Date Taking? Authorizing Provider  ALPRAZolam Duanne Moron) 0.5 MG tablet Take 1 tablet (0.5 mg total) by mouth 2 (two) times daily as needed for anxiety. 06/23/20  Yes Derek Jack, MD  aspirin EC 81 MG tablet Take 81 mg by mouth daily.    Yes [provider]  atorvastatin (LIPITOR) 80 MG tablet Take 80 mg by mouth every evening.  12/25/11  Yes [provider]  co-enzyme Q-10 30 MG capsule Take 30 mg by mouth daily.   Yes [provider]  ezetimibe (ZETIA) 10 MG tablet Take 10 mg by mouth daily.  12/25/11  Yes [provider]  folic acid (FOLVITE) 485 MCG tablet Take 800 mcg by mouth daily.    Yes [provider]  INV-nivolumab ECOG-ACRIN [id number] 240 mg in sodium chloride 0.9 % 50 mL Inject 240 mg into the vein every 14 (fourteen) days. As directed in Sod Chl 0.9% 165ml (OPDIVO)   Yes [provider]  isosorbide mononitrate (IMDUR) 60 MG 24 hr tablet Take 60 mg by mouth daily. 12/16/19  Yes [provider]  leucovorin in dextrose 5 % 250 mL Inject 868 mg into the vein every 14 (fourteen) days. As directed infused in D5W (227ml)   Yes [provider]  lidocaine-prilocaine (EMLA) cream Apply a dime-sized amount to port a cath site and cover with plastic wrap 1 hour prior to chemotherapy appointments 12/30/19  Yes Derek Jack, MD  losartan (COZAAR) 50 MG tablet Take 25 mg by mouth daily.  11/17/19  Yes [provider]  metFORMIN (GLUCOPHAGE-XR) 500 MG 24 hr tablet Take 1,000 mg by mouth in the morning and at bedtime.  04/26/20  Yes [provider]  metoprolol tartrate (LOPRESSOR) 25 MG tablet Take 0.5 tablets (12.5 mg total) by mouth 2 (two) times daily. 12/03/19  Deveron Furlong, MD  Misc. Devices MISC Please provide osmolite 1.5.  2 cartons four times daily via feeding tube (8 daily total).  Flush with 15ml water before and after administration of osmolite. 07/26/20  Yes Derek Jack, MD  Multiple Vitamin (MULTI-VITAMINS) TABS Take 1 tablet by mouth daily.    Yes [provider]  oxaliplatin in dextrose 5 % 500 mL Inject 150 mg into the vein every 14 (fourteen)  days. As directed in D5W 510ml   Yes [provider]  Oxycodone HCl 10 MG TABS Take 1 tablet (10 mg total) by mouth at bedtime as needed. 06/28/20  Yes Derek Jack, MD  prochlorperazine (COMPAZINE) 10 MG tablet Take 1 tablet (10 mg total) by mouth every 6 (six) hours as needed (Nausea or vomiting). 12/30/19  Yes Derek Jack, MD  tamsulosin (FLOMAX) 0.4 MG CAPS capsule Take 0.4 mg by mouth daily.  12/02/11  Yes [provider]    Allergies    Patient has no known allergies.  Review of Systems   Review of Systems  Constitutional: Negative for appetite change and fatigue.  HENT: Negative for congestion, ear discharge and sinus pressure.   Eyes: Negative  for discharge.  Respiratory: Negative for cough.   Cardiovascular: Negative for chest pain.  Gastrointestinal: Positive for abdominal distention and abdominal pain. Negative for diarrhea.  Genitourinary: Negative for frequency and hematuria.  Musculoskeletal: Negative for back pain.  Skin: Negative for rash.  Neurological: Negative for seizures and headaches.  Psychiatric/Behavioral: Negative for hallucinations.    Physical Exam Updated Vital Signs BP 117/88   Pulse 84   Temp 97.7 F (36.5 C) (Oral)   Resp 20   Ht 5\' 10"  (1.778 m)   Wt 90.3 kg   SpO2 100%   BMI 28.55 kg/m   Physical Exam Vitals and nursing note reviewed.  Constitutional:      Appearance: He is well-developed.  HENT:     Head: Normocephalic.  Eyes:     General: No scleral icterus.    Conjunctiva/sclera: Conjunctivae normal.  Neck:     Thyroid: No thyromegaly.  Cardiovascular:     Rate and Rhythm: Normal rate and regular rhythm.     Heart sounds: No murmur heard.  No friction rub. No gallop.   Pulmonary:     Breath sounds: No stridor. No wheezing or rales.  Chest:     Chest wall: No tenderness.  Abdominal:     General: There is distension.     Tenderness: There is no abdominal tenderness. There is no rebound.      Comments: G-tube present  Musculoskeletal:        General: Normal range of motion.     Cervical back: Neck supple.  Lymphadenopathy:     Cervical: No cervical adenopathy.  Skin:    Findings: No erythema or rash.  Neurological:     Mental Status: He is alert and oriented to person, place, and time.     Motor: No abnormal muscle tone.     Coordination: Coordination normal.  Psychiatric:        Behavior: Behavior normal.     ED Results / Procedures / Treatments   Labs (all labs ordered are listed, but only abnormal results are displayed) Labs Reviewed  CBC WITH DIFFERENTIAL/PLATELET - Abnormal; Notable for the following components:      Result Value   RBC 3.97 (*)    Hemoglobin 12.4 (*)    HCT 38.0 (*)    Platelets 411 (*)    All other components within normal limits  COMPREHENSIVE METABOLIC PANEL - Abnormal; Notable for the following components:   Sodium 133 (*)    Chloride 95 (*)    Glucose, Bld 116 (*)    Calcium 8.6 (*)    All other components within normal limits  LIPASE, BLOOD    EKG None  Radiology CT ABDOMEN PELVIS W CONTRAST  Result Date: 08/03/2020 CLINICAL DATA:  Abdominal distension, reflux from feeding tube, history of gastroesophageal cancer, ongoing chemotherapy contrast injected into feeding tube EXAM: CT ABDOMEN AND PELVIS WITH CONTRAST TECHNIQUE: Multidetector CT imaging of the abdomen and pelvis was performed using the standard protocol following bolus administration of intravenous contrast. CONTRAST:  152mL OMNIPAQUE IOHEXOL 300 MG/ML  SOLN COMPARISON:  CT chest, abdomen and pelvis 06/09/2020, PET-CT 03/22/2020 FINDINGS: Lower chest: There are bilateral pleural effusions. Bandlike areas of opacity likely reflecting subsegmental atelectasis and/or scarring. Cardiac size within normal limits. Coronary artery calcifications are present. Small pericardial effusion. Hepatobiliary: Insert normal liver layering attenuation within the gallbladder, could reflect  some biliary sludge. No visible calcified gallstones or frank gallbladder wall thickening. No biliary ductal dilatation. Pancreas: Unremarkable. No pancreatic ductal  dilatation or surrounding inflammatory changes. Spleen: Normal in size. No concerning splenic lesions. Adrenals/Urinary Tract: Normal adrenal glands. Kidneys enhance and excrete uniformly and symmetrically. 3 cm fluid attenuation cyst seen in the upper pole left kidney. Additional subcentimeter hypodense foci in the left kidney too small to fully characterize on CT imaging but statistically likely benign. No concerning renal masses. No urolithiasis or hydronephrosis. No urolithiasis or hydronephrosis. Indentation of bladder base by an enlarged, nodular prostate. Mild circumferential bladder wall thickening, nonspecific. Stomach/Bowel: Irregular thickening in the distal thoracic esophagus, incompletely assessed given underdistention and lack of luminal contrast media. No high attenuation contrast material is seen refluxing into the esophagus though the dilute appearance of the contrast may limit sensitivity. Percutaneous gastrostomy tube is in place. Inflated balloon within the gastric lumen at the antrum. Mild circumferential thickening towards the gastric antrum/pylorus likely related to peristalsis/normal contraction. Duodenum is unremarkable. Mild diffuse thickening of the small bowel, nonspecific given the presence of extensive large volume ascites throughout the abdomen and pelvis. No dilatation A normal appendix is visualized. No colonic dilatation or wall thickening. Scattered colonic diverticula without focal inflammation to suggest diverticulitis. Vascular/Lymphatic: Atherosclerotic calcifications within the abdominal aorta and branch vessels. No aneurysm or ectasia. No enlarged abdominopelvic lymph nodes. Enlarging subcarinal node now measuring up to 15 mm, previously 13 mm. Enlarging gastrohepatic nodes are noted as well, largest measuring up  to 16 mm, not seen on prior examinations. Reproductive: Prostatomegaly. Heterogeneous nodularity of the prostate is nonspecific. Other: Interval development of extensive omental caking particularly in the right upper quadrant with a more conglomerate soft tissue attenuation measuring up to 4.2 x 9.6 x 5.4 cm in maximal AP by transverse by craniocaudal dimensions. Adherent loops of bowel without evidence of resulting obstruction. Interval development of a large volume ascites of low to intermediate attenuation measuring up to 20 HU. No abdominopelvic free air. No bowel containing hernias. Fat containing umbilical hernia containing some omental nodularity as well (2/63). Diffuse mild body wall edema including more focal skin thickening about the umbilicus and inferior abdominal panniculus. Musculoskeletal: No acute osseous abnormality or suspicious osseous lesion. Multilevel degenerative changes are present in the imaged portions of the spine. Additional moderate degenerative changes in the bilateral hips and pelvis. Enthesopathic changes noted about the pelvis and proximal femora as well. IMPRESSION: 1. Interval development of extensive omental caking particularly in the right upper quadrant with a more conglomerate soft tissue attenuation and within a fat containing umbilical hernia. 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 2. No high attenuation contrast material is seen refluxing into the esophagus though the dilute appearance of the contrast may limit sensitivity.Percutaneous gastrostomy tube is in place. Inflated balloon within the gastric lumen at the antrum. 3. Irregular thickening in the distal thoracic esophagus, incompletely assessed given underdistention and lack of luminal contrast media. 4. Enlarging subcarinal and gastrohepatic nodes, concerning for progression of nodal metastatic disease. 5. Bilateral pleural effusions, small pericardial effusion, and diffuse mild body wall  edema. 6. Mild circumferential bladder wall thickening, nonspecific. Possibly related to chronic outlet obstruction with a heterogeneous, enlarged prostate though should correlate with urinalysis to exclude cystitis. 7. Heterogeneity of the prostate gland is nonspecific. Correlate with outpatient prostate exam and PSA as clinically warranted. 8. Aortic Atherosclerosis (ICD10-I70.0). Electronically Signed   By: Lovena Le M.D.   On: 08/03/2020 18:44    Procedures Procedures (including critical care time)  Medications Ordered in ED Medications  sodium chloride 0.9 % bolus 1,000 mL (1,000  mLs Intravenous New Bag/Given 08/03/20 1823)  ondansetron (ZOFRAN) injection 4 mg (4 mg Intravenous Given 08/03/20 1823)  iohexol (OMNIPAQUE) 300 MG/ML solution 100 mL (100 mLs Intravenous Contrast Given 08/03/20 1805)    ED Course  I have reviewed the triage vital signs and the nursing notes.  Pertinent labs & imaging results that were available during my care of the patient were reviewed by me and considered in my medical decision making (see chart for details).    MDM Rules/Calculators/A&P                         Patient with mild dehydration.  He was given a liter of normal saline.  CT scan shows worsening cancer with significant omental involvement.  No obstruction seen.  I discussed these results with his oncologist Dr. Delton Coombes and we will discharge the patient home and have him start his tube feedings again.  His oncologist will call him tomorrow to see how he is doing.      ,jz  This patient presents to the ED for concern of abdominal pain, this involves an extensive number of treatment options, and is a complaint that carries with it a high risk of complications and morbidity.  The differential diagnosis includes bowel obstruction   Lab Tests:   I Ordered, reviewed, and interpreted labs, which included CBC and chemistries showed mild anemia  Medicines ordered:   I ordered medication  Zofran for nausea and saline for dehydration  Imaging Studies ordered:   I ordered imaging studies which included CT abdomen  I independently visualized and interpreted imaging which showed worsening adenocarcinoma with extension into the omentum  Additional history obtained:   Additional history obtained from Dr. Laural Golden  Previous records obtained and reviewed .  Consultations Obtained:   I consulted oncology and discussed lab and imaging findings  Reevaluation:  After the interventions stated above, I reevaluated the patient and found some improved  Critical Interventions:  .   Final Clinical Impression(s) / ED Diagnoses Final diagnoses:  None    Rx / DC Orders ED Discharge Orders    None       Milton Ferguson, MD 08/03/20 2020

## 2020-08-03 NOTE — Discharge Instructions (Addendum)
Dr. Delton Coombes will contact you tomorrow.  Go ahead and use your tube for feeding.  Let Dr. Delton Coombes know how it goes

## 2020-08-03 NOTE — ED Triage Notes (Signed)
Pt is a cancer pt with a feeding tube, feedings and water goes through the tube with no problems but has had some go up esophagus and causing pt to vomit. Pt with swelling to abdomen.  Ongoing since Saturday and pt not able to give self adequate fluids.  Pt sent here for possible dehydration.  LBM last night after using an enema.

## 2020-08-03 NOTE — Progress Notes (Signed)
Presenting complaint;  Abdominal distention. 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  Further work-up revealed stage IV disease.  PET scan revealed hypermetabolic lymph nodes in supraclavicular region, mediastinum as well as upper abdomen consistent with metastatic disease.  He also had hypermetabolic lesion in right femoral neck and anterior left iliac spine and L2 vertebral body and transverse process of T2.  He had Port-A-Cath and PEG by Dr. Arnoldo Morale on 12/24/2019 and he was begun on chemotherapy.  First cycle was on 12/31/2019. He underwent chest abdominopelvic CT on 06/10/2020 revealing resolution of tree-in-bud nodularity in the lungs stable mildly enlarged subcarinal lymph node as well as small residual lymph nodes in gastrohepatic ligament and porta hepatis unchanged since previous PET scan. In the meantime he developed hoarseness.  He was evaluated by Dr. Ernestine Conrad, ENT specialist at [facility] and diagnosed with left vocal cord paralysis and is scheduled for FEES study and injection augmentation/implant of left vocal cord on 08/19/2020. He also has been experiencing dysphagia and Dr. Delton Coombes recommended endoscopic evaluation. Patient received chemotherapy on 07/12/2020 and developed rigors after receiving oxaliplatin.  According to his wife he developed fever when he got home and spiked to 103.3.  EMS was called.  Decided not to bring him to emergency room because of Covid.  He took Tylenol and temp dropped to 99.  He did receive chemotherapy on 07/26/2020 but without oxaliplatin him and he did not have any problems. Patient said he has not been able to take his medications or Osmolite via PEG  tube.  Today he drank a bottle of water.  He can take sips he may have some cough.  When he took Osmolite yesterday via PEG tube it regurgitated my oral cavity and rest came out via tube.  He has not been able to tolerate any feeding since then.  Today he took a bottle of water.  Is been taking sips.  He also complains of abdominal distention which is developed over the last couple days.  He has not had any fever or chills since his symptoms started.  He felt constipated.  He took a Fleet enema yesterday and had a large bowel movement.  No melena or rectal bleeding reported. His wife says that he used to have problems with hoarseness but not to this degree.  He denies heartburn.      Current Medications: Outpatient Encounter Medications as of 08/03/2020  Medication Sig  . ALPRAZolam (XANAX) 0.5 MG tablet Take 1 tablet (0.5 mg total) by mouth 2 (two) times daily as needed for anxiety.  Marland Kitchen aspirin EC 81 MG tablet Take 81 mg by mouth daily.   Marland Kitchen atorvastatin (LIPITOR) 80 MG tablet Take 80 mg by mouth every evening.   Marland Kitchen co-enzyme Q-10 30 MG capsule Take 30 mg by mouth daily.  Marland Kitchen ezetimibe (ZETIA) 10 MG tablet Take 10 mg by mouth daily.   Marland Kitchen FLUOROURACIL IV Inject into the vein every 14 (fourteen) days.  . folic acid (FOLVITE) 161 MCG tablet Take 800 mcg by mouth daily.   . isosorbide mononitrate (IMDUR) 60 MG 24 hr tablet Take 60 mg by mouth daily.  Marland Kitchen LEUCOVORIN CALCIUM IV Inject into the vein every 14 (fourteen) days.  Marland Kitchen lidocaine-prilocaine (EMLA) cream Apply  a dime-sized amount to port a cath site and cover with plastic wrap 1 hour prior to chemotherapy appointments  . losartan (COZAAR) 50 MG tablet Take 25 mg by mouth daily.   . metFORMIN (GLUCOPHAGE-XR) 500 MG 24 hr tablet Take 2,000 mg by mouth every morning.  . metoprolol tartrate (LOPRESSOR) 25 MG tablet Take 0.5 tablets (12.5 mg total) by mouth 2 (two) times daily.  . Multiple Vitamin (MULTI-VITAMINS) TABS Take 1 tablet by mouth daily.   Marland Kitchen  NIVOLUMAB IV Inject into the vein every 14 (fourteen) days.  . OXALIPLATIN IV Inject into the vein every 14 (fourteen) days.  . Oxycodone HCl 10 MG TABS Take 1 tablet (10 mg total) by mouth at bedtime as needed.  . prochlorperazine (COMPAZINE) 10 MG tablet Take 1 tablet (10 mg total) by mouth every 6 (six) hours as needed (Nausea or vomiting).  . tamsulosin (FLOMAX) 0.4 MG CAPS capsule Take 0.4 mg by mouth daily.   Marland Kitchen HYDROcodone-acetaminophen (HYCET) 7.5-325 mg/15 ml solution Take 15 mLs by mouth every 4 (four) hours as needed for moderate pain. (Patient not taking: Reported on 08/03/2020)  . Misc. Devices MISC Please provide osmolite 1.5.  2 cartons four times daily via feeding tube (8 daily total).  Flush with 24m water before and after administration of osmolite. (Patient not taking: Reported on 08/03/2020)  . [DISCONTINUED] amoxicillin-clavulanate (AUGMENTIN) 875-125 MG tablet Take 1 tablet by mouth 2 (two) times daily. (Patient not taking: Reported on 08/03/2020)  . [DISCONTINUED] ascorbic acid (VITAMIN C) 500 MG tablet Take 500 mg by mouth 2 (two) times a week.  (Patient not taking: Reported on 08/03/2020)  . [DISCONTINUED] calcium-vitamin D (OSCAL WITH D) 500-200 MG-UNIT tablet Take 2 tablets by mouth 2 (two) times daily. (Patient not taking: Reported on 08/03/2020)   No facility-administered encounter medications on file as of 08/03/2020.   Past Medical History:  Diagnosis Date  . 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[place]   . Anxiety   . Diabetes ([place]   . Heart attack ([place]   . Heart disease   . High cholesterol   . Hyperplasia of prostate   . Hypertension   . Low back pain   . Port-A-Cath in place 12/30/2019  . Tremor    Past Surgical History:  Procedure Laterality Date  . arterectomy     1993  . BIOPSY  12/01/2019   Procedure: BIOPSY;  Surgeon: RRogene Houston MD;  [facility];  Service: Endoscopy;;  esophagus  . ESOPHAGOGASTRODUODENOSCOPY (EGD) WITH  PROPOFOL N/A 12/01/2019   Procedure: ESOPHAGOGASTRODUODENOSCOPY (EGD) WITH PROPOFOL;  Surgeon: RRogene Houston MD;  Location: [facility];  Service: Endoscopy;  Laterality: N/A;  . ESOPHAGOGASTRODUODENOSCOPY (EGD) WITH PROPOFOL N/A 12/24/2019   Procedure: ESOPHAGOGASTRODUODENOSCOPY (EGD) WITH PROPOFOL;  Surgeon: JAviva Signs MD;  Location: [facility] OR[facility];  Service: General;  Laterality: N/A;  . Heart stents     2002, 2004, 2006  . PEG PLACEMENT N/A 12/24/2019   Procedure: PERCUTANEOUS ENDOSCOPIC GASTROSTOMY (PEG) PLACEMENT;  Surgeon: JAviva Signs MD;  Location: [facility];  Service: General;  Laterality: N/A;  . PORTACATH PLACEMENT Left 12/24/2019   Procedure: INSERTION PORT-A-CATH;  Surgeon: JAviva Signs MD;  Location: [facility];  Service: General;  Laterality: Left;      Objective: Blood pressure 113/81, pulse 85, temperature 97.6 F (36.4 C), temperature source Oral, height _0  (1.778 m), weight 199 lb 12.8 oz (90.6 kg). Patient is alert and in no acute distress. His voice is markedly hoarse.  He is wearing a mask. Conjunctiva is pink. Sclera is nonicteric Oropharyngeal mucosa is normal. No neck masses or thyromegaly noted. Cardiac exam with regular rhythm normal S1 and S2. No murmur or gallop noted. Lungs are clear to auscultation. Abdomen is distended.  Bowel sounds are normal.  He has small umbilical hernia.He has mild tenderness in peri-umblical area. Flanks are dull and bulging.No organomegaly or masses. No LE edema or clubbing noted.  Labs/studies Results:  CBC Latest Ref Rng & Units 07/26/2020 07/16/2020 07/12/2020  WBC 4.0 - 10.5 K/uL 9.7 5.6 6.1  Hemoglobin 13.0 - 17.0 g/dL 11.5(L) 10.5(L) 10.2(L)  Hematocrit 39 - 52 % 35.1(L) 32.8(L) 31.3(L)  Platelets 150 - 400 K/uL 385 346 265    CMP Latest Ref Rng & Units 07/26/2020 07/16/2020 07/12/2020  Glucose 70 - 99 mg/dL 195(H) 141(H) 228(H)  BUN 8 - 23 mg/dL _0 Creatinine 0.61 - 1.24 mg/dL 0.88 0.74 0.81  Sodium 135 - 145  mmol/L 132(L) 135 135  Potassium 3.5 - 5.1 mmol/L 4.4 4.3 3.9  Chloride 98 - 111 mmol/L 95(L) 101 99  CO2 22 - 32 mmol/L _1 Calcium 8.9 - 10.3 mg/dL 9.1 9.8 8.9  Total Protein 6.5 - 8.1 g/dL 7.0 7.2 6.8  Total Bilirubin 0.3 - 1.2 mg/dL 0.5 0.5 0.4  Alkaline Phos 38 - 126 U/L 51 60 56  AST 15 - 41 U/L 26 46(H) 26  ALT 0 - 44 U/L 30 47(H) 30    Hepatic Function Latest Ref Rng & Units 07/26/2020 07/16/2020 07/12/2020  Total Protein 6.5 - 8.1 g/dL 7.0 7.2 6.8  Albumin 3.5 - 5.0 g/dL 3.2(L) 3.2(L) 3.2(L)  AST 15 - 41 U/L 26 46(H) 26  ALT 0 - 44 U/L 30 47(H) 30  Alk Phosphatase 38 - 126 U/L 51 60 56  Total Bilirubin 0.3 - 1.2 mg/dL 0.5 0.5 0.4    Chest and abdominal pelvic CT from July is 8, /2021 reviewed. Findings as above.  Assessment:  #1.  Abdominal distention of recent onset with inability to tolerate gastric feeding.  It remains to be seen if he has developed ileus.  Doubt gastrostomy tube migration.  Patient has not received hydration and nutrition and medications in the last 2 days.  He needs to be further evaluated in the emergency room to determine the source of his abdominal distention.  He could have ileus.  Doubt ascites.  #2.  History of distal esophageal and GE junction adenocarcinoma resulting from undiagnosed Barrett's esophagus.  Patient has finished chemotherapy and no abnormality noted to GE junction and cardia.  He is having dysphagia possibly due to scarring stenosis and would benefit from endoscopic evaluation.  Esophagogastroduodenoscopy would be scheduled as soon as acute illness sorted out.  He may also have an element of pharyngeal dysphagia due to paralysis of left recurrent laryngeal nerve.   Plan:  Patient referred to emergency room. Dr. Milton Ferguson contacted. Patient will need IV fluids and abdominal pelvic CT with oral contrast while PEG tube if tolerated. Esophagogastroduodenoscopy to be scheduled in near future.

## 2020-08-04 ENCOUNTER — Encounter (HOSPITAL_COMMUNITY): Payer: Self-pay

## 2020-08-04 ENCOUNTER — Ambulatory Visit (HOSPITAL_COMMUNITY)
Admission: RE | Admit: 2020-08-04 | Discharge: 2020-08-04 | Disposition: A | Discharge status: Home / Self Care | Payer: Medicare HMO | Source: Ambulatory Visit | Attending: Hematology | Admitting: Hematology

## 2020-08-04 ENCOUNTER — Inpatient Hospital Stay (HOSPITAL_COMMUNITY): Payer: Medicare HMO | Attending: Hematology | Admitting: Hematology

## 2020-08-04 VITALS — BP 90/63 | HR 87 | Temp 97.1°F | Resp 17 | Wt 202.4 lb

## 2020-08-04 DIAGNOSIS — R490 Dysphonia: Secondary | ICD-10-CM | POA: Diagnosis not present

## 2020-08-04 DIAGNOSIS — Z7984 Long term (current) use of oral hypoglycemic drugs: Secondary | ICD-10-CM | POA: Insufficient documentation

## 2020-08-04 DIAGNOSIS — Z800 Family history of malignant neoplasm of digestive organs: Secondary | ICD-10-CM | POA: Insufficient documentation

## 2020-08-04 DIAGNOSIS — Z87891 Personal history of nicotine dependence: Secondary | ICD-10-CM | POA: Diagnosis not present

## 2020-08-04 DIAGNOSIS — Z79899 Other long term (current) drug therapy: Secondary | ICD-10-CM | POA: Diagnosis not present

## 2020-08-04 DIAGNOSIS — R634 Abnormal weight loss: Secondary | ICD-10-CM | POA: Diagnosis not present

## 2020-08-04 DIAGNOSIS — E119 Type 2 diabetes mellitus without complications: Secondary | ICD-10-CM | POA: Diagnosis not present

## 2020-08-04 DIAGNOSIS — I10 Essential (primary) hypertension: Secondary | ICD-10-CM | POA: Diagnosis not present

## 2020-08-04 DIAGNOSIS — Z5111 Encounter for antineoplastic chemotherapy: Secondary | ICD-10-CM | POA: Insufficient documentation

## 2020-08-04 DIAGNOSIS — R188 Other ascites: Secondary | ICD-10-CM | POA: Insufficient documentation

## 2020-08-04 DIAGNOSIS — C160 Malignant neoplasm of cardia: Secondary | ICD-10-CM | POA: Insufficient documentation

## 2020-08-04 DIAGNOSIS — C7951 Secondary malignant neoplasm of bone: Secondary | ICD-10-CM | POA: Diagnosis not present

## 2020-08-04 DIAGNOSIS — F419 Anxiety disorder, unspecified: Secondary | ICD-10-CM | POA: Diagnosis not present

## 2020-08-04 MED ORDER — FUROSEMIDE 20 MG PO TABS: 20.0000 mg | ORAL_TABLET | Freq: Every day | ORAL | 2 refills | Status: DC

## 2020-08-04 NOTE — Progress Notes (Signed)
DISCONTINUE ON PATHWAY REGIMEN - Gastroesophageal     A cycle is every 14 days:     Oxaliplatin      Leucovorin      Fluorouracil      Fluorouracil   **Always confirm dose/schedule in your pharmacy ordering system**  REASON: Disease Progression PRIOR TREATMENT: GEOS3: [id number] q14 Days Until Progression or Unacceptable Toxicity TREATMENT RESPONSE: Progressive Disease (PD)  START ON PATHWAY REGIMEN - Gastroesophageal     A cycle is every 28 days:     Ramucirumab      Paclitaxel   **Always confirm dose/schedule in your pharmacy ordering system**  Patient Characteristics: Distant Metastases (cM1/pM1) / Locally Recurrent Disease, Adenocarcinoma - Esophageal, GE Junction, and Gastric, Second Line, MSS/pMMR or MSI Unknown Histology: Adenocarcinoma Disease Classification: GE Junction Therapeutic Status: Distant Metastases (No Additional Staging) Line of Therapy: Second Line Microsatellite/Mismatch Repair Status: MSS/pMMR Intent of Therapy: Non-Curative / Palliative Intent, Discussed with Patient

## 2020-08-04 NOTE — Progress Notes (Signed)
Foundation One and PDL-1 results placed in chart.

## 2020-08-04 NOTE — Patient Instructions (Signed)
[place] at [facility] Discharge Instructions  You were seen today by Dr. Delton Coombes. He went over your recent results. You had fluid pulled from your abdomen. Dr. Delton Coombes will see you back in 1 week for labs and follow up.   Thank you for choosing [place] at [facility] to provide your oncology and hematology care.  To afford each patient quality time with our provider, please arrive at least 15 minutes before your scheduled appointment time.   If you have a lab appointment with the [facility] please come in thru the Main Entrance and check in at the main information desk  You need to re-schedule your appointment should you arrive 10 or more minutes late.  We strive to give you quality time with our providers, and arriving late affects you and other patients whose appointments are after yours.  Also, if you no show three or more times for appointments you may be dismissed from the clinic at the providers discretion.     Again, thank you for choosing [facility].  Our hope is that these requests will decrease the amount of time that you wait before being seen by our physicians.       _____________________________________________________________  Should you have questions after your visit to [facility], please contact our office at [phone] between the hours of 8:00 a.m. and 4:30 p.m.  Voicemails left after 4:00 p.m. will not be returned until the following business day.  For prescription refill requests, have your pharmacy contact our office and allow 72 hours.    Cancer Center Support Programs:   > Cancer Support Group  2nd Tuesday of the month 1pm-2pm, Journey Room

## 2020-08-04 NOTE — Progress Notes (Signed)
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[place])  12/24/2019 Initial Diagnosis   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[place])   12/29/2019 Cancer Staging   Staging form: Esophagus - Adenocarcinoma, AJCC 8th Edition - Clinical stage from 12/29/2019: Stage IVB (cTX, cN3, cM1) - Signed by Derek Jack, MD on 12/29/2019   12/31/2019 -  Chemotherapy   The patient had palonosetron (ALOXI) injection 0.25 mg, 0.25 mg, Intravenous,  Once, 14 of 16 cycles Administration: 0.25 mg (12/31/2019), 0.25 mg (01/14/2020), 0.25 mg (01/28/2020), 0.25 mg (02/11/2020), 0.25 mg (02/25/2020), 0.25 mg (03/10/2020), 0.25 mg (03/24/2020), 0.25 mg (04/12/2020), 0.25 mg (04/26/2020), 0.25 mg (05/10/2020), 0.25 mg (05/24/2020), 0.25 mg (06/28/2020), 0.25 mg (07/12/2020), 0.25 mg (07/26/2020) leucovorin 868 mg in dextrose 5 % 250 mL infusion, 400 mg/m2 = 868 mg, Intravenous,  Once, 14 of 16 cycles Administration: 868 mg (12/31/2019), 868 mg (01/14/2020), 868 mg (01/28/2020), 868 mg (02/11/2020), 868 mg (02/25/2020), 868 mg (03/10/2020), 868 mg (03/24/2020), 868 mg (04/12/2020), 868 mg (04/26/2020), 868 mg (05/10/2020), 868 mg (05/24/2020), 868 mg (06/28/2020), 868 mg (07/12/2020), 868 mg (07/26/2020) oxaliplatin (ELOXATIN) 150 mg in dextrose 5 % 500 mL chemo infusion, 68 mg/m2 = 150 mg (80 % of original dose 85 mg/m2), Intravenous,  Once, 13 of 15 cycles Dose  modification: 68 mg/m2 (80 % of original dose 85 mg/m2, Cycle 1, Reason: Other (see comments), Comment: diabetic neuropathy) Administration: 150 mg (12/31/2019), 150 mg (01/14/2020), 150 mg (01/28/2020), 150 mg (02/11/2020), 150 mg (02/25/2020), 150 mg (03/10/2020), 150 mg (03/24/2020), 150 mg (04/12/2020), 150 mg (04/26/2020), 150 mg (05/10/2020), 150 mg (05/24/2020), 150 mg (06/28/2020), 150 mg (07/12/2020) fluorouracil (ADRUCIL) chemo injection 850 mg, 400 mg/m2 = 850 mg, Intravenous,  Once, 14 of 16 cycles Administration: 850 mg (12/31/2019), 850 mg (01/14/2020), 850 mg (01/28/2020), 850 mg (02/11/2020), 850 mg (02/25/2020), 850 mg (03/10/2020), 850 mg (03/24/2020), 850 mg (04/12/2020), 850 mg (04/26/2020), 850 mg (05/10/2020), 850 mg (05/24/2020), 850 mg (06/28/2020), 850 mg (07/26/2020) fluorouracil (ADRUCIL) 5,000 mg in sodium chloride 0.9 % 150 mL chemo infusion, 2,310 mg/m2 = 5,200 mg, Intravenous, 1 Day/Dose, 14 of 16 cycles Administration: 5,000 mg (12/31/2019), 5,000 mg (01/14/2020), 5,000 mg (01/28/2020), 5,000 mg (02/11/2020), 5,000 mg (02/25/2020), 5,000 mg (03/10/2020), 5,000 mg (03/24/2020), 5,000 mg (04/12/2020), 5,000 mg (04/26/2020), 5,000 mg (05/10/2020), 5,000 mg (05/24/2020), 5,200 mg (06/28/2020), 5,200 mg (07/26/2020) nivolumab (OPDIVO) 240 mg in sodium chloride 0.9 % 100 mL chemo infusion, 240 mg (100 % of original dose 240 mg), Intravenous, Once, 14 of 16 cycles Dose modification: 240 mg (original dose 240 mg, Cycle 1, Reason: Provider Judgment) Administration: 240 mg (12/31/2019), 240 mg (01/14/2020), 240 mg (01/28/2020), 240 mg (02/11/2020), 240 mg (02/25/2020), 240 mg (03/10/2020), 240 mg (03/24/2020), 240 mg (04/12/2020), 240 mg (04/26/2020), 240 mg (05/10/2020), 240 mg (05/24/2020), 240 mg (06/28/2020), 240 mg (07/12/2020), 240 mg (07/26/2020)  for chemotherapy treatment.     Genetic Testing  Foundation One and PDL-1 Results:         CANCER STAGING: Cancer Staging 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[place]) Staging  form: Esophagus - Adenocarcinoma, AJCC 8th Edition - Clinical stage from 12/29/2019: Stage IVB (cTX, cN3, cM1) - Signed by Derek Jack, MD on 12/29/2019   INTERVAL HISTORY:  [name], a 69 y.o. male, returns for routine follow-up of his stage IV GE junction adenocarcinoma. [name] was last seen on 07/26/2020.   Today he is accompanied by his wife. He reports that he was sent to [facility] by Dr. Laural Golden yesterday after having a distended abdomen and he reports that he has gone 2 days without his meds due to abdominal discomfort. His appetite and energy levels have gone down since his abdomen has been distended and is feeling lightheaded today; he drank about 24 ounces of water today. The last time he tried eating solid food by mouth was 3 weeks ago.  REVIEW OF SYSTEMS:  Review of Systems  Constitutional: Positive for appetite change (depleted) and fatigue (depleted).  Gastrointestinal: Positive for abdominal pain (discomfort), constipation, diarrhea and nausea.  Neurological: Positive for numbness (feet & fingertips).  All other systems reviewed and are negative.   PAST MEDICAL/SURGICAL HISTORY:  Past Medical History:  Diagnosis Date  . 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[place])   . Anxiety   . Diabetes ([place])   . Heart attack ([place])   . Heart disease   . High cholesterol   . Hyperplasia of prostate   . Hypertension   . Low back pain   . Port-A-Cath in place 12/30/2019  . Tremor    Past Surgical History:  Procedure Laterality Date  . arterectomy     1993  . BIOPSY  12/01/2019   Procedure: BIOPSY;  Surgeon: Rogene Houston, MD;  [facility];  Service: Endoscopy;;  esophagus  . ESOPHAGOGASTRODUODENOSCOPY (EGD) WITH PROPOFOL N/A 12/01/2019   Procedure: ESOPHAGOGASTRODUODENOSCOPY (EGD) WITH PROPOFOL;  Surgeon: Rogene Houston, MD;  [facility];  Service: Endoscopy;  Laterality: N/A;  . ESOPHAGOGASTRODUODENOSCOPY (EGD) WITH PROPOFOL N/A  12/24/2019   Procedure: ESOPHAGOGASTRODUODENOSCOPY (EGD) WITH PROPOFOL;  Surgeon: Aviva Signs, MD;  Location: [facility] ORS;  Service: General;  Laterality: N/A;  . Heart stents     2002, 2004, 2006  . PEG PLACEMENT N/A 12/24/2019   Procedure: PERCUTANEOUS ENDOSCOPIC GASTROSTOMY (PEG) PLACEMENT;  Surgeon: Aviva Signs, MD;  Location: [facility];  Service: General;  Laterality: N/A;  . PORTACATH PLACEMENT Left 12/24/2019   Procedure: INSERTION PORT-A-CATH;  Surgeon: Aviva Signs, MD;  Location: [facility];  Service: General;  Laterality: Left;    SOCIAL HISTORY:  Social History   Socioeconomic History  . Marital status: Married    Spouse name: Not on file  . Number of children: 2  . Years of education: 61  . Highest education level: Not on file  Occupational History  . Occupation: [employer]    Comment: Delivers medication  Tobacco Use  . Smoking status: Former Smoker    Packs/day: 1.00    Years: 50.00    Pack years: 50.00    Quit date: 12/21/2017    Years since quitting: 2.6  . Smokeless tobacco: Never Used  . Tobacco comment: Quit 2017  Vaping Use  . Vaping Use: Never used  Substance and Sexual Activity  . Alcohol use: Yes    Alcohol/week: 0.0 standard drinks    Comment: 3 drinks per week  . Drug use: No  . Sexual activity:  Yes  Other Topics Concern  . Not on file  Social History Narrative   Lives at home with his wife.   Right-handed.   2 diet cokes per day.   Social Determinants of Health   Financial Resource Strain: Low Risk   . Difficulty of Paying Living Expenses: Not hard at all  Food Insecurity: No Food Insecurity  . Worried About Charity fundraiser in the Last Year: Never true  . Ran Out of Food in the Last Year: Never true  Transportation Needs: No Transportation Needs  . Lack of Transportation (Medical): No  . Lack of Transportation (Non-Medical): No  Physical Activity: Insufficiently Active  . Days of Exercise per Week: 2 days  . Minutes of Exercise per Session:  30 min  Stress: No Stress Concern Present  . Feeling of Stress : Only a little  Social Connections: Socially Integrated  . Frequency of Communication with Friends and Family: More than three times a week  . Frequency of Social Gatherings with Friends and Family: More than three times a week  . Attends Religious Services: 1 to 4 times per year  . Active Member of Clubs or Organizations: No  . Attends Archivist Meetings: 1 to 4 times per year  . Marital Status: Married  Human resources officer Violence: Not At Risk  . Fear of Current or Ex-Partner: No  . Emotionally Abused: No  . Physically Abused: No  . Sexually Abused: No    FAMILY HISTORY:  Family History  Problem Relation Age of Onset  . Alzheimer's disease Mother   . Diabetes Father   . Heart disease Father   . Stroke Father   . Heart disease Brother   . Colon cancer Brother   . Alzheimer's disease Sister     CURRENT MEDICATIONS:  Current Outpatient Medications  Medication Sig Dispense Refill  . ALPRAZolam (XANAX) 0.5 MG tablet Take 1 tablet (0.5 mg total) by mouth 2 (two) times daily as needed for anxiety. 30 tablet 0  . aspirin EC 81 MG tablet Take 81 mg by mouth daily.     Marland Kitchen atorvastatin (LIPITOR) 80 MG tablet Take 80 mg by mouth every evening.     Marland Kitchen co-enzyme Q-10 30 MG capsule Take 30 mg by mouth daily.    Marland Kitchen ezetimibe (ZETIA) 10 MG tablet Take 10 mg by mouth daily.     . folic acid (FOLVITE) 557 MCG tablet Take 800 mcg by mouth daily.     . INV-nivolumab ECOG-ACRIN DU2025 240 mg in sodium chloride 0.9 % 50 mL Inject 240 mg into the vein every 14 (fourteen) days. As directed in Sod Chl 0.9% 129m (OPDIVO)    . isosorbide mononitrate (IMDUR) 60 MG 24 hr tablet Take 60 mg by mouth daily.    .Marland Kitchenleucovorin in dextrose 5 % 250 mL Inject 868 mg into the vein every 14 (fourteen) days. As directed infused in D5W (2531m    . losartan (COZAAR) 50 MG tablet Take 25 mg by mouth daily.     . metFORMIN (GLUCOPHAGE-XR) 500 MG 24  hr tablet Take 1,000 mg by mouth in the morning and at bedtime.     . metoprolol tartrate (LOPRESSOR) 25 MG tablet Take 0.5 tablets (12.5 mg total) by mouth 2 (two) times daily. 60 tablet 1  . Misc. Devices MISC Please provide osmolite 1.5.  2 cartons four times daily via feeding tube (8 daily total).  Flush with 6037mater before and after administration of  osmolite. 1 each 0  . Multiple Vitamin (MULTI-VITAMINS) TABS Take 1 tablet by mouth daily.     Marland Kitchen oxaliplatin in dextrose 5 % 500 mL Inject 150 mg into the vein every 14 (fourteen) days. As directed in D5W 581m    . Oxycodone HCl 10 MG TABS Take 1 tablet (10 mg total) by mouth at bedtime as needed. 30 tablet 0  . tamsulosin (FLOMAX) 0.4 MG CAPS capsule Take 0.4 mg by mouth daily.     .Marland Kitchenlidocaine-prilocaine (EMLA) cream Apply a dime-sized amount to port a cath site and cover with plastic wrap 1 hour prior to chemotherapy appointments (Patient not taking: Reported on 08/04/2020) 30 g 3  . prochlorperazine (COMPAZINE) 10 MG tablet Take 1 tablet (10 mg total) by mouth every 6 (six) hours as needed (Nausea or vomiting). (Patient not taking: Reported on 08/04/2020) 30 tablet 1   No current facility-administered medications for this visit.    ALLERGIES:  No Known Allergies  PHYSICAL EXAM:  Performance status (ECOG): 1 - Symptomatic but completely ambulatory  Vitals:   08/04/20 1422  BP: 90/63  Pulse: 87  Resp: 17  Temp: (!) 97.1 F (36.2 C)  SpO2: 98%   Wt Readings from Last 3 Encounters:  08/04/20 202 lb 6.4 oz (91.8 kg)  08/03/20 199 lb (90.3 kg)  08/03/20 199 lb 12.8 oz (90.6 kg)   Physical Exam Vitals reviewed.  Constitutional:      Appearance: Normal appearance.  Cardiovascular:     Rate and Rhythm: Normal rate and regular rhythm.     Pulses: Normal pulses.     Heart sounds: Normal heart sounds.  Chest:     Comments: Port-a-Cath in L chest Abdominal:     General: There is distension.     Tenderness: There is no abdominal  tenderness.     Comments: PEG tube in place  Musculoskeletal:     Right lower leg: Edema (trace) present.     Left lower leg: Edema (trace) present.  Neurological:     General: No focal deficit present.     Mental Status: He is alert and oriented to person, place, and time.  Psychiatric:        Mood and Affect: Mood normal.        Behavior: Behavior normal.      LABORATORY DATA:  I have reviewed the labs as listed.  CBC Latest Ref Rng & Units 08/03/2020 07/26/2020 07/16/2020  WBC 4.0 - 10.5 K/uL 7.2 9.7 5.6  Hemoglobin 13.0 - 17.0 g/dL 12.4(L) 11.5(L) 10.5(L)  Hematocrit 39 - 52 % 38.0(L) 35.1(L) 32.8(L)  Platelets 150 - 400 K/uL 411(H) 385 346   CMP Latest Ref Rng & Units 08/03/2020 07/26/2020 07/16/2020  Glucose 70 - 99 mg/dL 116(H) 195(H) 141(H)  BUN 8 - 23 mg/dL _0 Creatinine 0.61 - 1.24 mg/dL 0.93 0.88 0.74  Sodium 135 - 145 mmol/L 133(L) 132(L) 135  Potassium 3.5 - 5.1 mmol/L 4.0 4.4 4.3  Chloride 98 - 111 mmol/L 95(L) 95(L) 101  CO2 22 - 32 mmol/L _1 Calcium 8.9 - 10.3 mg/dL 8.6(L) 9.1 9.8  Total Protein 6.5 - 8.1 g/dL 8.0 7.0 7.2  Total Bilirubin 0.3 - 1.2 mg/dL 0.6 0.5 0.5  Alkaline Phos 38 - 126 U/L 48 51 60  AST 15 - 41 U/L 31 26 46(H)  ALT 0 - 44 U/L 33 30 47(H)   Lab Results  Component Value Date   LDH 116 07/26/2020  LDH 196 (H) 07/16/2020   LDH 107 06/28/2020    DIAGNOSTIC IMAGING:  I have independently reviewed the scans and discussed with the patient. CT ABDOMEN PELVIS W CONTRAST  Result Date: 08/03/2020 CLINICAL DATA:  Abdominal distension, reflux from feeding tube, history of gastroesophageal cancer, ongoing chemotherapy contrast injected into feeding tube EXAM: CT ABDOMEN AND PELVIS WITH CONTRAST TECHNIQUE: Multidetector CT imaging of the abdomen and pelvis was performed using the standard protocol following bolus administration of intravenous contrast. CONTRAST:  15m OMNIPAQUE IOHEXOL 300 MG/ML  SOLN COMPARISON:  CT chest, abdomen and  pelvis 06/09/2020, PET-CT 03/22/2020 FINDINGS: Lower chest: There are bilateral pleural effusions. Bandlike areas of opacity likely reflecting subsegmental atelectasis and/or scarring. Cardiac size within normal limits. Coronary artery calcifications are present. Small pericardial effusion. Hepatobiliary: Insert normal liver layering attenuation within the gallbladder, could reflect some biliary sludge. No visible calcified gallstones or frank gallbladder wall thickening. No biliary ductal dilatation. Pancreas: Unremarkable. No pancreatic ductal dilatation or surrounding inflammatory changes. Spleen: Normal in size. No concerning splenic lesions. Adrenals/Urinary Tract: Normal adrenal glands. Kidneys enhance and excrete uniformly and symmetrically. 3 cm fluid attenuation cyst seen in the upper pole left kidney. Additional subcentimeter hypodense foci in the left kidney too small to fully characterize on CT imaging but statistically likely benign. No concerning renal masses. No urolithiasis or hydronephrosis. No urolithiasis or hydronephrosis. Indentation of bladder base by an enlarged, nodular prostate. Mild circumferential bladder wall thickening, nonspecific. Stomach/Bowel: Irregular thickening in the distal thoracic esophagus, incompletely assessed given underdistention and lack of luminal contrast media. No high attenuation contrast material is seen refluxing into the esophagus though the dilute appearance of the contrast may limit sensitivity. Percutaneous gastrostomy tube is in place. Inflated balloon within the gastric lumen at the antrum. Mild circumferential thickening towards the gastric antrum/pylorus likely related to peristalsis/normal contraction. Duodenum is unremarkable. Mild diffuse thickening of the small bowel, nonspecific given the presence of extensive large volume ascites throughout the abdomen and pelvis. No dilatation A normal appendix is visualized. No colonic dilatation or wall thickening.  Scattered colonic diverticula without focal inflammation to suggest diverticulitis. Vascular/Lymphatic: Atherosclerotic calcifications within the abdominal aorta and branch vessels. No aneurysm or ectasia. No enlarged abdominopelvic lymph nodes. Enlarging subcarinal node now measuring up to 15 mm, previously 13 mm. Enlarging gastrohepatic nodes are noted as well, largest measuring up to 16 mm, not seen on prior examinations. Reproductive: Prostatomegaly. Heterogeneous nodularity of the prostate is nonspecific. Other: Interval development of extensive omental caking particularly in the right upper quadrant with a more conglomerate soft tissue attenuation measuring up to 4.2 x 9.6 x 5.4 cm in maximal AP by transverse by craniocaudal dimensions. Adherent loops of bowel without evidence of resulting obstruction. Interval development of a large volume ascites of low to intermediate attenuation measuring up to 20 HU. No abdominopelvic free air. No bowel containing hernias. Fat containing umbilical hernia containing some omental nodularity as well (2/63). Diffuse mild body wall edema including more focal skin thickening about the umbilicus and inferior abdominal panniculus. Musculoskeletal: No acute osseous abnormality or suspicious osseous lesion. Multilevel degenerative changes are present in the imaged portions of the spine. Additional moderate degenerative changes in the bilateral hips and pelvis. Enthesopathic changes noted about the pelvis and proximal femora as well. IMPRESSION: 1. Interval development of extensive omental caking particularly in the right upper quadrant with a more conglomerate soft tissue attenuation and within a fat containing umbilical hernia. 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 2.  No high attenuation contrast material is seen refluxing into the esophagus though the dilute appearance of the contrast may limit sensitivity.Percutaneous gastrostomy tube is in place.  Inflated balloon within the gastric lumen at the antrum. 3. Irregular thickening in the distal thoracic esophagus, incompletely assessed given underdistention and lack of luminal contrast media. 4. Enlarging subcarinal and gastrohepatic nodes, concerning for progression of nodal metastatic disease. 5. Bilateral pleural effusions, small pericardial effusion, and diffuse mild body wall edema. 6. Mild circumferential bladder wall thickening, nonspecific. Possibly related to chronic outlet obstruction with a heterogeneous, enlarged prostate though should correlate with urinalysis to exclude cystitis. 7. Heterogeneity of the prostate gland is nonspecific. Correlate with outpatient prostate exam and PSA as clinically warranted. 8. Aortic Atherosclerosis (ICD10-I70.0). Electronically Signed   By: Lovena Le M.D.   On: 08/03/2020 18:44     ASSESSMENT:  1. 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 HER-2 FISH to be followed up. PD-L1 CPS-5%. -FOLFOX and Opdivo started on 12/31/2019. -PET scan on 03/22/2020 showed marked reduction in size and metabolic activity in the distal esophageal lesion, marked reduction in size of mediastinal adenopathy. Near complete resolution with only 2 small mildly metabolic mediastinal lymph nodes remaining. Resolution of lymph nodes in the upper abdomen. Right hip bone lesion is still present but better. -CT CAP on 06/09/2020 showed interval resolution of previously demonstrated tree-in-bud nodularity in the lungs. Stable enlarged subcarinal lymph node and small residual lymph nodes with the gastrohepatic ligament and porta hepatis unchanged from the most recent PET scan. -CTAP on 08/03/2020 shows interval development of extensive omental caking particularly in the right upper quadrant with a more conglomerate soft tissue attenuation and large volume ascites.  Irregular thickening in the distal esophagus incompletely assessed.  Enlarging subcarinal and  gastrohepatic nodes.   PLAN:  1. Stage IV GE junction adenocarcinoma: -He went to the ER with abdominal distention. -CT scan findings were discussed showing progression. -Recommended change in therapy to paclitaxel and Cyramza. -We discussed the side effects in detail including allergic reactions to paclitaxel. -Likely start treatment next week.  2. 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  He is having an injection done on 08/19/2020.  3.Weight loss: -Because of distention, he was able to taking only 1 can of Osmolite.  Yesterday he is not getting any.  Hopefully he will restart them after paracentesis today.  4. Bone metastasis: -We will continue Zometa monthly.  5. Diabetes: -Continue Metformin.  6. Hypertension: -Will hold Lopressor if systolic blood pressure is less than 110.  7.Anxiety: -Continue Xanax 0.5 mg as needed.  8.  Ascites: -This is new onset.  Likely malignancy related.  Will obtain paracentesis today.  We will send for cytology. -We will start him on Lasix 20 mg to fluid on.  Hopefully the reaccumulation will stop once chemotherapy is started.   Orders placed this encounter:  No orders of the defined types were placed in this encounter.    Derek Jack, MD [facility] [facility] [place] [facility] [phone]   I, Milinda Antis, am acting as a scribe for Dr. Sanda Linger.  I, Derek Jack MD, have reviewed the above documentation for accuracy and completeness, and I agree with the above.

## 2020-08-04 NOTE — Procedures (Signed)
PreOperative Dx: Ascites Postoperative Dx: Ascites Procedure:   US guided paracentesis Radiologist:  Thornton Papas Anesthesia:  10 ml of1% lidocaine Specimen:  3.75 L of serosanguinous ascitic fluid EBL:   < 1 ml Complications: None

## 2020-08-04 NOTE — Progress Notes (Signed)
ON PATHWAY REGIMEN - Gastroesophageal  No Change  Continue With Treatment as Ordered.  Original Decision Date/Time: 12/29/2019 16:06     A cycle is every 14 days:     Oxaliplatin      Leucovorin      Fluorouracil      Fluorouracil   **Always confirm dose/schedule in your pharmacy ordering system**  Patient Characteristics: Distant Metastases (cM1/pM1) / Locally Recurrent Disease, Adenocarcinoma - Esophageal, GE Junction, and Gastric, First Line, HER2 Negative / Unknown Histology: Adenocarcinoma Disease Classification: GE Junction Therapeutic Status: Distant Metastases (No Additional Staging) Line of Therapy: First Line HER2 Status: Negative Intent of Therapy: Non-Curative / Palliative Intent, Discussed with Patient

## 2020-08-04 NOTE — Progress Notes (Signed)
Paracentesis complete no signs of distress.  

## 2020-08-06 LAB — CYTOLOGY - NON PAP

## 2020-08-08 NOTE — Progress Notes (Signed)
.   Pharmacist Chemotherapy Monitoring - Initial Assessment    Anticipated start date: 08/11/20   Regimen:  . Are orders appropriate based on the patient's diagnosis, regimen, and cycle? Yes . Does the plan date match the patient's scheduled date? Yes . Is the sequencing of drugs appropriate? Yes . Are the premedications appropriate for the patient's regimen? Yes . Prior Authorization for treatment is: Approved o If applicable, is the correct biosimilar selected based on the patient's insurance? not applicable  Organ Function and Labs: Marland Kitchen Are dose adjustments needed based on the patient's renal function, hepatic function, or hematologic function? No . Are appropriate labs ordered prior to the start of patient's treatment? Yes . Other organ system assessment, if indicated: ramucirumab: baseline BP . The following baseline labs, if indicated, have been ordered: Cyramza - urine protein  Dose Assessment: . Are the drug doses appropriate? Yes . Are the following correct: o Drug concentrations Yes o IV fluid compatible with drug Yes o Administration routes Yes o Timing of therapy Yes . If applicable, does the patient have documented access for treatment and/or plans for port-a-cath placement? yes . If applicable, have lifetime cumulative doses been properly documented and assessed? not applicable Lifetime Dose Tracking  . Oxaliplatin: 826.386 mg/m2 (1,950 mg)  = 137.73 % of the maximum lifetime dose of 600 mg/m2 -- Actual total dose might be higher than it appears here, due to incomplete documentation  o   Toxicity Monitoring/Prevention: . The patient has the following take home antiemetics prescribed: Prochlorperazine . The patient has the following take home medications prescribed: N/A . Medication allergies and previous infusion related reactions, if applicable, have been reviewed and addressed. Yes . The patient's current medication list has been assessed for drug-drug interactions  with their chemotherapy regimen. no significant drug-drug interactions were identified on review.  Order Review: . Are the treatment plan orders signed? No . Is the patient scheduled to see a provider prior to their treatment? Yes  I verify that I have reviewed each item in the above checklist and answered each question accordingly.  Wynona Neat 08/08/2020 2:53 PM

## 2020-08-10 ENCOUNTER — Ambulatory Visit (INDEPENDENT_AMBULATORY_CARE_PROVIDER_SITE_OTHER): Payer: Medicare HMO | Admitting: Gastroenterology

## 2020-08-11 ENCOUNTER — Inpatient Hospital Stay (HOSPITAL_COMMUNITY): Payer: Medicare HMO

## 2020-08-11 ENCOUNTER — Encounter (HOSPITAL_COMMUNITY): Payer: Self-pay

## 2020-08-11 ENCOUNTER — Inpatient Hospital Stay (HOSPITAL_COMMUNITY): Payer: Medicare HMO | Admitting: Hematology

## 2020-08-11 ENCOUNTER — Ambulatory Visit (HOSPITAL_COMMUNITY)
Admission: RE | Admit: 2020-08-11 | Discharge: 2020-08-11 | Disposition: A | Discharge status: Home / Self Care | Payer: Medicare HMO | Source: Ambulatory Visit | Attending: Hematology | Admitting: Hematology

## 2020-08-11 ENCOUNTER — Other Ambulatory Visit: Payer: Self-pay

## 2020-08-11 VITALS — BP 112/77 | HR 86 | Temp 97.5°F | Resp 16

## 2020-08-11 VITALS — BP 116/65 | HR 106 | Temp 96.8°F | Resp 18 | Wt 195.7 lb

## 2020-08-11 DIAGNOSIS — Z95828 Presence of other vascular implants and grafts: Secondary | ICD-10-CM

## 2020-08-11 DIAGNOSIS — C160 Malignant neoplasm of cardia: Secondary | ICD-10-CM | POA: Diagnosis not present

## 2020-08-11 DIAGNOSIS — R180 Malignant ascites: Secondary | ICD-10-CM

## 2020-08-11 DIAGNOSIS — Z5111 Encounter for antineoplastic chemotherapy: Secondary | ICD-10-CM | POA: Diagnosis not present

## 2020-08-11 LAB — URINALYSIS, DIPSTICK ONLY
Bilirubin Urine: NEGATIVE
Glucose, UA: NEGATIVE mg/dL
Hgb urine dipstick: NEGATIVE
Ketones, ur: NEGATIVE mg/dL
Leukocytes,Ua: NEGATIVE
Nitrite: NEGATIVE
Protein, ur: 100 mg/dL — AB
Specific Gravity, Urine: 1.024 (ref 1.005–1.030)
pH: 5 (ref 5.0–8.0)

## 2020-08-11 LAB — MAGNESIUM: Magnesium: 1.9 mg/dL (ref 1.7–2.4)

## 2020-08-11 LAB — COMPREHENSIVE METABOLIC PANEL
ALT: 20 U/L (ref 0–44)
AST: 23 U/L (ref 15–41)
Albumin: 3 g/dL — ABNORMAL LOW (ref 3.5–5.0)
Alkaline Phosphatase: 48 U/L (ref 38–126)
Anion gap: 14 (ref 5–15)
BUN: 16 mg/dL (ref 8–23)
CO2: 25 mmol/L (ref 22–32)
Calcium: 9.4 mg/dL (ref 8.9–10.3)
Chloride: 95 mmol/L — ABNORMAL LOW (ref 98–111)
Creatinine, Ser: 0.99 mg/dL (ref 0.61–1.24)
GFR calc Af Amer: >60 mL/min (ref >60)
GFR calc non Af Amer: >60 mL/min (ref >60)
Glucose, Bld: 189 mg/dL — ABNORMAL HIGH (ref 70–99)
Potassium: 4.3 mmol/L (ref 3.5–5.1)
Sodium: 134 mmol/L — ABNORMAL LOW (ref 135–145)
Total Bilirubin: 0.8 mg/dL (ref 0.3–1.2)
Total Protein: 6.8 g/dL (ref 6.5–8.1)

## 2020-08-11 LAB — CBC WITH DIFFERENTIAL/PLATELET
Abs Immature Granulocytes: 0.02 10*3/uL (ref 0.00–0.07)
Basophils Absolute: 0.1 10*3/uL (ref 0.0–0.1)
Basophils Relative: 1 %
Eosinophils Absolute: 0.1 10*3/uL (ref 0.0–0.5)
Eosinophils Relative: 1 %
HCT: 36.3 % — ABNORMAL LOW (ref 39.0–52.0)
Hemoglobin: 11.9 g/dL — ABNORMAL LOW (ref 13.0–17.0)
Immature Granulocytes: 0 %
Lymphocytes Relative: 26 %
Lymphs Abs: 1.8 10*3/uL (ref 0.7–4.0)
MCH: 31.2 pg (ref 26.0–34.0)
MCHC: 32.8 g/dL (ref 30.0–36.0)
MCV: 95 fL (ref 80.0–100.0)
Monocytes Absolute: 0.7 10*3/uL (ref 0.1–1.0)
Monocytes Relative: 10 %
Neutro Abs: 4.3 10*3/uL (ref 1.7–7.7)
Neutrophils Relative %: 62 %
Platelets: 436 10*3/uL — ABNORMAL HIGH (ref 150–400)
RBC: 3.82 MIL/uL — ABNORMAL LOW (ref 4.22–5.81)
RDW: 14.9 % (ref 11.5–15.5)
WBC: 6.9 10*3/uL (ref 4.0–10.5)
nRBC: 0 % (ref 0.0–0.2)

## 2020-08-11 LAB — LACTATE DEHYDROGENASE: LDH: 95 U/L — ABNORMAL LOW (ref 98–192)

## 2020-08-11 MED ORDER — DIPHENHYDRAMINE HCL 50 MG/ML IJ SOLN
50.0000 mg | Freq: Once | INTRAMUSCULAR | Status: AC
  Administered 2020-08-11: 50 mg via INTRAVENOUS
  Filled 2020-08-11: qty 1

## 2020-08-11 MED ORDER — SODIUM CHLORIDE 0.9 % IV SOLN
8.0000 mg/kg | Freq: Once | INTRAVENOUS | Status: AC
  Administered 2020-08-11: 700 mg via INTRAVENOUS
  Filled 2020-08-11: qty 20

## 2020-08-11 MED ORDER — SODIUM CHLORIDE 0.9% FLUSH
10.0000 mL | INTRAVENOUS | Status: DC | PRN
  Administered 2020-08-11: 10 mL

## 2020-08-11 MED ORDER — SODIUM CHLORIDE 0.9 % IV SOLN
20.0000 mg | Freq: Once | INTRAVENOUS | Status: AC
  Administered 2020-08-11: 20 mg via INTRAVENOUS
  Filled 2020-08-11: qty 20

## 2020-08-11 MED ORDER — SODIUM CHLORIDE 0.9 % IV SOLN: Freq: Once | INTRAVENOUS | Status: DC

## 2020-08-11 MED ORDER — FAMOTIDINE IN NACL 20-0.9 MG/50ML-% IV SOLN
20.0000 mg | Freq: Once | INTRAVENOUS | Status: AC
  Administered 2020-08-11: 20 mg via INTRAVENOUS
  Filled 2020-08-11: qty 50

## 2020-08-11 MED ORDER — OXYCODONE HCL 5 MG PO TABS
10.0000 mg | ORAL_TABLET | Freq: Once | ORAL | Status: AC
  Administered 2020-08-11: 10 mg via ORAL
  Filled 2020-08-11: qty 2

## 2020-08-11 MED ORDER — HEPARIN SOD (PORK) LOCK FLUSH 100 UNIT/ML IV SOLN
500.0000 [IU] | Freq: Once | INTRAVENOUS | Status: AC | PRN
  Administered 2020-08-11: 500 [IU]

## 2020-08-11 MED ORDER — SODIUM CHLORIDE 0.9 % IV SOLN: Freq: Once | INTRAVENOUS | Status: AC

## 2020-08-11 MED ORDER — SODIUM CHLORIDE 0.9 % IV SOLN
80.0000 mg/m2 | Freq: Once | INTRAVENOUS | Status: AC
  Administered 2020-08-11: 168 mg via INTRAVENOUS
  Filled 2020-08-11: qty 28

## 2020-08-11 MED ORDER — SODIUM CHLORIDE 0.9 % IV SOLN: INTRAVENOUS | Status: DC

## 2020-08-11 NOTE — Patient Instructions (Signed)
[place] at [facility] Discharge Instructions  You were seen today by Dr. Delton Coombes. He went over your recent results. You received your treatment today. You will be scheduled for another paracentesis. You will be referred to physical therapy for your foot drop. Dr. Delton Coombes will see you back in 1 week for labs and follow up.   Thank you for choosing [place] at [facility] to provide your oncology and hematology care.  To afford each patient quality time with our provider, please arrive at least 15 minutes before your scheduled appointment time.   If you have a lab appointment with the [facility] please come in thru the Main Entrance and check in at the main information desk  You need to re-schedule your appointment should you arrive 10 or more minutes late.  We strive to give you quality time with our providers, and arriving late affects you and other patients whose appointments are after yours.  Also, if you no show three or more times for appointments you may be dismissed from the clinic at the providers discretion.     Again, thank you for choosing [facility].  Our hope is that these requests will decrease the amount of time that you wait before being seen by our physicians.       _____________________________________________________________  Should you have questions after your visit to [facility], please contact our office at [phone] between the hours of 8:00 a.m. and 4:30 p.m.  Voicemails left after 4:00 p.m. will not be returned until the following business day.  For prescription refill requests, have your pharmacy contact our office and allow 72 hours.    Cancer Center Support Programs:   > Cancer Support Group  2nd Tuesday of the month 1pm-2pm, Journey Room

## 2020-08-11 NOTE — Progress Notes (Signed)
[place], [place] [address]   CLINIC:  Medical Oncology/Hematology  PCP:  [name], MD [address] 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  PRIOR THERAPY: FOLFOX and nivolumab x 14 cycles from 12/31/2019 to 07/26/2020  NGS Results: PD-L1 CPS 5%  CURRENT THERAPY: Paclitaxel 3 weeks on, 1 week off; ramucirumab  BRIEF ONCOLOGIC HISTORY:  Oncology History  Esophageal cancer ([place])   Initial Diagnosis   Esophageal cancer ([place])    Genetic Testing   [place] One and PDL-1 Results:       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[place])  12/24/2019 Initial Diagnosis   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[place])   12/29/2019 Cancer Staging   Staging form: Esophagus - Adenocarcinoma, AJCC 8th Edition - Clinical stage from 12/29/2019: Stage IVB (cTX, cN3, cM1) - Signed by Derek Jack, MD on 12/29/2019   12/31/2019 - 07/28/2020 Chemotherapy   The patient had palonosetron (ALOXI) injection 0.25 mg, 0.25 mg, Intravenous,  Once, 14 of 16 cycles Administration: 0.25 mg (12/31/2019), 0.25 mg (01/14/2020), 0.25 mg (01/28/2020), 0.25 mg (02/11/2020), 0.25 mg (02/25/2020), 0.25 mg (03/10/2020), 0.25 mg (03/24/2020), 0.25 mg (04/12/2020), 0.25 mg (04/26/2020), 0.25 mg (05/10/2020), 0.25 mg (05/24/2020), 0.25 mg (06/28/2020), 0.25 mg (07/12/2020), 0.25 mg (07/26/2020) leucovorin 868 mg in dextrose 5 % 250 mL infusion, 400 mg/m2 = 868 mg, Intravenous,  Once, 14 of 16 cycles Administration: 868 mg (12/31/2019), 868 mg (01/14/2020), 868 mg (01/28/2020), 868 mg (02/11/2020), 868 mg (02/25/2020), 868 mg (03/10/2020), 868 mg (03/24/2020), 868 mg (04/12/2020), 868 mg (04/26/2020), 868 mg (05/10/2020), 868 mg (05/24/2020), 868 mg (06/28/2020), 868 mg (07/12/2020), 868 mg (07/26/2020) oxaliplatin (ELOXATIN) 150 mg in dextrose 5 % 500 mL chemo infusion, 68 mg/m2 = 150 mg (80 % of original dose 85 mg/m2), Intravenous,   Once, 13 of 15 cycles Dose modification: 68 mg/m2 (80 % of original dose 85 mg/m2, Cycle 1, Reason: Other (see comments), Comment: diabetic neuropathy) Administration: 150 mg (12/31/2019), 150 mg (01/14/2020), 150 mg (01/28/2020), 150 mg (02/11/2020), 150 mg (02/25/2020), 150 mg (03/10/2020), 150 mg (03/24/2020), 150 mg (04/12/2020), 150 mg (04/26/2020), 150 mg (05/10/2020), 150 mg (05/24/2020), 150 mg (06/28/2020), 150 mg (07/12/2020) fluorouracil (ADRUCIL) chemo injection 850 mg, 400 mg/m2 = 850 mg, Intravenous,  Once, 14 of 16 cycles Administration: 850 mg (12/31/2019), 850 mg (01/14/2020), 850 mg (01/28/2020), 850 mg (02/11/2020), 850 mg (02/25/2020), 850 mg (03/10/2020), 850 mg (03/24/2020), 850 mg (04/12/2020), 850 mg (04/26/2020), 850 mg (05/10/2020), 850 mg (05/24/2020), 850 mg (06/28/2020), 850 mg (07/26/2020) fluorouracil (ADRUCIL) 5,000 mg in sodium chloride 0.9 % 150 mL chemo infusion, 2,310 mg/m2 = 5,200 mg, Intravenous, 1 Day/Dose, 14 of 16 cycles Administration: 5,000 mg (12/31/2019), 5,000 mg (01/14/2020), 5,000 mg (01/28/2020), 5,000 mg (02/11/2020), 5,000 mg (02/25/2020), 5,000 mg (03/10/2020), 5,000 mg (03/24/2020), 5,000 mg (04/12/2020), 5,000 mg (04/26/2020), 5,000 mg (05/10/2020), 5,000 mg (05/24/2020), 5,200 mg (06/28/2020), 5,200 mg (07/26/2020) nivolumab (OPDIVO) 240 mg in sodium chloride 0.9 % 100 mL chemo infusion, 240 mg (100 % of original dose 240 mg), Intravenous, Once, 14 of 16 cycles Dose modification: 240 mg (original dose 240 mg, Cycle 1, Reason: Provider Judgment) Administration: 240 mg (12/31/2019), 240 mg (01/14/2020), 240 mg (01/28/2020), 240 mg (02/11/2020), 240 mg (02/25/2020), 240 mg (03/10/2020), 240 mg (03/24/2020), 240 mg (04/12/2020), 240 mg (04/26/2020), 240 mg (05/10/2020), 240 mg (05/24/2020), 240 mg (06/28/2020), 240 mg (07/12/2020), 240 mg (07/26/2020)  for chemotherapy treatment.  Dyess One and PDL-1 Results:       08/11/2020 -  Chemotherapy   The patient had PACLitaxel (TAXOL) 168 mg  in sodium chloride 0.9 % 250 mL chemo infusion (</= 69m/m2), 80 mg/m2, Intravenous,  Once, 0 of 6 cycles ramucirumab (CYRAMZA) 700 mg in sodium chloride 0.9 % 180 mL chemo infusion, 8 mg/kg, Intravenous, Once, 0 of 6 cycles  for chemotherapy treatment.      CANCER STAGING: Cancer Staging 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[facility] Staging form: Esophagus - Adenocarcinoma, AJCC 8th Edition - Clinical stage from 12/29/2019: Stage IVB (cTX, cN3, cM1) - Signed by KDerek Jack MD on 12/29/2019   INTERVAL HISTORY:  Mr. RBen Habermann a 69y.o. male, returns for routine follow-up and consideration for first cycle of chemotherapy. RMikkelwas last seen on 08/04/2020.  Due for initiating cycle #1 of paclitaxel and ramucirumab today.   Today he reports feeling okay. He complains of continuing abdominal distention which is preventing him from drinking anything or putting anything into his PEG tube. He complains of having fatigue daily. He complains of having cramping soreness in his right calf and foot drop, throwing off his gait.  He is scheduled for his vocal cord procedure on 9/16.  Overall, he feels ready for first cycle of chemo today.    REVIEW OF SYSTEMS:  Review of Systems  Constitutional: Positive for appetite change (severely decreased) and fatigue (severe).  HENT:   Positive for trouble swallowing.   Gastrointestinal: Positive for abdominal distention, abdominal pain (5/10 discomfort), diarrhea (intermittent) and vomiting (occasional).  Musculoskeletal: Positive for myalgias (cramping soreness in R calf and R foot drop).  Neurological: Positive for numbness (bottom of feet; worsening).  All other systems reviewed and are negative.   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[facility]    Anxiety    Diabetes ([facility]    Heart attack ([place]    Heart disease    High cholesterol    Hyperplasia  of prostate    Hypertension    Low back pain    Port-A-Cath in place 12/30/2019   Tremor    Past Surgical History:  Procedure Laterality Date   arterectomy     1993   BIOPSY  12/01/2019   Procedure: BIOPSY;  Surgeon: RRogene Houston MD;  Location: [facility];  Service: Endoscopy;;  esophagus   ESOPHAGOGASTRODUODENOSCOPY (EGD) WITH PROPOFOL N/A 12/01/2019   Procedure: ESOPHAGOGASTRODUODENOSCOPY (EGD) WITH PROPOFOL;  Surgeon: RRogene Houston MD;  Location: [facility];  Service: Endoscopy;  Laterality: N/A;   ESOPHAGOGASTRODUODENOSCOPY (EGD) WITH PROPOFOL N/A 12/24/2019   Procedure: ESOPHAGOGASTRODUODENOSCOPY (EGD) WITH PROPOFOL;  Surgeon: JAviva Signs MD;  Location: [facility] ORS;  Service: General;  Laterality: N/A;   Heart stents     2002, 2004, 2006   PEG PLACEMENT N/A 12/24/2019   Procedure: PERCUTANEOUS ENDOSCOPIC GASTROSTOMY (PEG) PLACEMENT;  Surgeon: JAviva Signs MD;  Location: [facility] ORS;  Service: General;  Laterality: N/A;   PORTACATH PLACEMENT Left 12/24/2019   Procedure: INSERTION PORT-A-CATH;  Surgeon: JAviva Signs MD;  Location: [facility] ORS;  Service: General;  Laterality: Left;    SOCIAL HISTORY:  Social History   Socioeconomic History   Marital status: Married    Spouse name: Not on file   Number of children: 2   Years of education: 16   Highest education level: Not on file  Occupational History   Occupation: EEngineer, agricultural  Comment: Delivers medication  Tobacco Use   Smoking status: Former Smoker    Packs/day: 1.00    Years: 50.00    Pack years: 50.00    Quit date: 12/21/2017    Years since quitting: 2.6   Smokeless tobacco: Never Used   Tobacco comment: Quit 2017  Vaping Use   Vaping Use: Never used  Substance and Sexual Activity   Alcohol use: Yes    Alcohol/week: 0.0 standard drinks    Comment: 3 drinks per week   Drug use: No   Sexual activity: Yes  Other Topics Concern   Not on file  Social History Narrative   Lives at home  with his wife.   Right-handed.   2 diet cokes per day.   Social Determinants of Health   Financial Resource Strain: Low Risk    Difficulty of Paying Living Expenses: Not hard at all  Food Insecurity: No Food Insecurity   Worried About Charity fundraiser in the Last Year: Never true   [place] in the Last Year: Never true  Transportation Needs: No Transportation Needs   Lack of Transportation (Medical): No   Lack of Transportation (Non-Medical): No  Physical Activity: Insufficiently Active   Days of Exercise per Week: 2 days   Minutes of Exercise per Session: 30 min  Stress: No Stress Concern Present   Feeling of Stress : Only a little  Social Connections: Engineer, building services of Communication with Friends and Family: More than three times a week   Frequency of Social Gatherings with Friends and Family: More than three times a week   Attends Religious Services: 1 to 4 times per year   Active Member of [employer] or Organizations: No   Attends Music therapist: 1 to 4 times per year   Marital Status: Married  Human resources officer Violence: Not At Risk   Fear of Current or Ex-Partner: No   Emotionally Abused: No   Physically Abused: No   Sexually Abused: No    FAMILY HISTORY:  Family History  Problem Relation Age of Onset   Alzheimer's disease Mother    Diabetes Father    Heart disease Father    Stroke Father    Heart disease Brother    Colon cancer Brother    Alzheimer's disease Sister     CURRENT MEDICATIONS:  Current Outpatient Medications  Medication Sig Dispense Refill   aspirin EC 81 MG tablet Take 81 mg by mouth daily.      atorvastatin (LIPITOR) 80 MG tablet Take 80 mg by mouth every evening.      ezetimibe (ZETIA) 10 MG tablet Take 10 mg by mouth daily.      folic acid (FOLVITE) 960 MCG tablet Take 800 mcg by mouth daily.      isosorbide mononitrate (IMDUR) 60 MG 24 hr tablet Take 60 mg by mouth daily.       metFORMIN (GLUCOPHAGE-XR) 500 MG 24 hr tablet Take 500 mg by mouth daily.      Misc. Devices MISC Please provide osmolite 1.5.  2 cartons four times daily via feeding tube (8 daily total).  Flush with 37m water before and after administration of osmolite. 1 each 0   Multiple Vitamin (MULTI-VITAMINS) TABS Take 1 tablet by mouth daily.      tamsulosin (FLOMAX) 0.4 MG CAPS capsule Take 0.4 mg by mouth daily.      ALPRAZolam (XANAX) 0.5 MG tablet Take 1 tablet (0.5 mg total)  by mouth 2 (two) times daily as needed for anxiety. (Patient not taking: Reported on 08/11/2020) 30 tablet 0   co-enzyme Q-10 30 MG capsule Take 30 mg by mouth daily. (Patient not taking: Reported on 08/11/2020)     furosemide (LASIX) 20 MG tablet Take 1 tablet (20 mg total) by mouth daily. Do not take if the upper number of your BP is less than 110. (Patient not taking: Reported on 08/11/2020) 30 tablet 2   losartan (COZAAR) 50 MG tablet Take 25 mg by mouth daily.  (Patient not taking: Reported on 08/11/2020)     metoprolol tartrate (LOPRESSOR) 25 MG tablet Take 0.5 tablets (12.5 mg total) by mouth 2 (two) times daily. (Patient not taking: Reported on 08/11/2020) 60 tablet 1   Oxycodone HCl 10 MG TABS Take 1 tablet (10 mg total) by mouth at bedtime as needed. (Patient not taking: Reported on 08/11/2020) 30 tablet 0   PACLitaxel (TAXOL IV) Inject into the vein.     Ramucirumab (CYRAMZA IV) Inject into the vein.     No current facility-administered medications for this visit.    ALLERGIES:  No Known Allergies  PHYSICAL EXAM:  Performance status (ECOG): 1 - Symptomatic but completely ambulatory  Vitals:   08/11/20 0828  BP: 116/65  Pulse: (!) 106  Resp: 18  Temp: (!) 96.8 F (36 C)  SpO2: 96%   Wt Readings from Last 3 Encounters:  08/11/20 195 lb 11.2 oz (88.8 kg)  08/04/20 202 lb 6.4 oz (91.8 kg)  08/03/20 199 lb (90.3 kg)   Physical Exam Vitals reviewed.  Constitutional:      Appearance: Normal appearance.   Cardiovascular:     Rate and Rhythm: Normal rate and regular rhythm.     Pulses: Normal pulses.     Heart sounds: Normal heart sounds.  Pulmonary:     Effort: Pulmonary effort is normal.     Breath sounds: Normal breath sounds.  Chest:     Comments: Port-a-Cath in L chest Abdominal:     General: There is distension.     Palpations: Abdomen is soft.     Tenderness: There is no abdominal tenderness.     Comments: PEG tube  Musculoskeletal:     Right lower leg: No edema.     Left lower leg: No edema.     Right foot: Foot drop present.  Neurological:     General: No focal deficit present.     Mental Status: He is alert and oriented to person, place, and time.  Psychiatric:        Mood and Affect: Mood normal.        Behavior: Behavior normal.     LABORATORY DATA:  I have reviewed the labs as listed.  CBC Latest Ref Rng & Units 08/11/2020 08/03/2020 07/26/2020  WBC 4.0 - 10.5 K/uL 6.9 7.2 9.7  Hemoglobin 13.0 - 17.0 g/dL 11.9(L) 12.4(L) 11.5(L)  Hematocrit 39 - 52 % 36.3(L) 38.0(L) 35.1(L)  Platelets 150 - 400 K/uL 436(H) 411(H) 385   CMP Latest Ref Rng & Units 08/11/2020 08/03/2020 07/26/2020  Glucose 70 - 99 mg/dL 189(H) 116(H) 195(H)  BUN 8 - 23 mg/dL _0 Creatinine 0.61 - 1.24 mg/dL 0.99 0.93 0.88  Sodium 135 - 145 mmol/L 134(L) 133(L) 132(L)  Potassium 3.5 - 5.1 mmol/L 4.3 4.0 4.4  Chloride 98 - 111 mmol/L 95(L) 95(L) 95(L)  CO2 22 - 32 mmol/L _1 Calcium 8.9 - 10.3 mg/dL 9.4 8.6(L)  9.1  Total Protein 6.5 - 8.1 g/dL 6.8 8.0 7.0  Total Bilirubin 0.3 - 1.2 mg/dL 0.8 0.6 0.5  Alkaline Phos 38 - 126 U/L 48 48 51  AST 15 - 41 U/L _0 ALT 0 - 44 U/L 20 33 30   Lab Results  Component Value Date   LDH 95 (L) 08/11/2020   LDH 116 07/26/2020   LDH 196 (H) 07/16/2020    DIAGNOSTIC IMAGING:  I have independently reviewed the scans and discussed with the patient. CT ABDOMEN PELVIS W CONTRAST  Result Date: 08/03/2020 CLINICAL DATA:  Abdominal distension,  reflux from feeding tube, history of gastroesophageal cancer, ongoing chemotherapy contrast injected into feeding tube EXAM: CT ABDOMEN AND PELVIS WITH CONTRAST TECHNIQUE: Multidetector CT imaging of the abdomen and pelvis was performed using the standard protocol following bolus administration of intravenous contrast. CONTRAST:  157m OMNIPAQUE IOHEXOL 300 MG/ML  SOLN COMPARISON:  CT chest, abdomen and pelvis 06/09/2020, PET-CT 03/22/2020 FINDINGS: Lower chest: There are bilateral pleural effusions. Bandlike areas of opacity likely reflecting subsegmental atelectasis and/or scarring. Cardiac size within normal limits. Coronary artery calcifications are present. Small pericardial effusion. Hepatobiliary: Insert normal liver layering attenuation within the gallbladder, could reflect some biliary sludge. No visible calcified gallstones or frank gallbladder wall thickening. No biliary ductal dilatation. Pancreas: Unremarkable. No pancreatic ductal dilatation or surrounding inflammatory changes. Spleen: Normal in size. No concerning splenic lesions. Adrenals/Urinary Tract: Normal adrenal glands. Kidneys enhance and excrete uniformly and symmetrically. 3 cm fluid attenuation cyst seen in the upper pole left kidney. Additional subcentimeter hypodense foci in the left kidney too small to fully characterize on CT imaging but statistically likely benign. No concerning renal masses. No urolithiasis or hydronephrosis. No urolithiasis or hydronephrosis. Indentation of bladder base by an enlarged, nodular prostate. Mild circumferential bladder wall thickening, nonspecific. Stomach/Bowel: Irregular thickening in the distal thoracic esophagus, incompletely assessed given underdistention and lack of luminal contrast media. No high attenuation contrast material is seen refluxing into the esophagus though the dilute appearance of the contrast may limit sensitivity. Percutaneous gastrostomy tube is in place. Inflated balloon within  the gastric lumen at the antrum. Mild circumferential thickening towards the gastric antrum/pylorus likely related to peristalsis/normal contraction. Duodenum is unremarkable. Mild diffuse thickening of the small bowel, nonspecific given the presence of extensive large volume ascites throughout the abdomen and pelvis. No dilatation A normal appendix is visualized. No colonic dilatation or wall thickening. Scattered colonic diverticula without focal inflammation to suggest diverticulitis. Vascular/Lymphatic: Atherosclerotic calcifications within the abdominal aorta and branch vessels. No aneurysm or ectasia. No enlarged abdominopelvic lymph nodes. Enlarging subcarinal node now measuring up to 15 mm, previously 13 mm. Enlarging gastrohepatic nodes are noted as well, largest measuring up to 16 mm, not seen on prior examinations. Reproductive: Prostatomegaly. Heterogeneous nodularity of the prostate is nonspecific. Other: Interval development of extensive omental caking particularly in the right upper quadrant with a more conglomerate soft tissue attenuation measuring up to 4.2 x 9.6 x 5.4 cm in maximal AP by transverse by craniocaudal dimensions. Adherent loops of bowel without evidence of resulting obstruction. Interval development of a large volume ascites of low to intermediate attenuation measuring up to 20 HU. No abdominopelvic free air. No bowel containing hernias. Fat containing umbilical hernia containing some omental nodularity as well (2/63). Diffuse mild body wall edema including more focal skin thickening about the umbilicus and inferior abdominal panniculus. Musculoskeletal: No acute osseous abnormality or suspicious osseous lesion. Multilevel degenerative changes are present in  the imaged portions of the spine. Additional moderate degenerative changes in the bilateral hips and pelvis. Enthesopathic changes noted about the pelvis and proximal femora as well. IMPRESSION: 1. Interval development of extensive  omental caking particularly in the right upper quadrant with a more conglomerate soft tissue attenuation and within a fat containing umbilical hernia. 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 2. No high attenuation contrast material is seen refluxing into the esophagus though the dilute appearance of the contrast may limit sensitivity.Percutaneous gastrostomy tube is in place. Inflated balloon within the gastric lumen at the antrum. 3. Irregular thickening in the distal thoracic esophagus, incompletely assessed given underdistention and lack of luminal contrast media. 4. Enlarging subcarinal and gastrohepatic nodes, concerning for progression of nodal metastatic disease. 5. Bilateral pleural effusions, small pericardial effusion, and diffuse mild body wall edema. 6. Mild circumferential bladder wall thickening, nonspecific. Possibly related to chronic outlet obstruction with a heterogeneous, enlarged prostate though should correlate with urinalysis to exclude cystitis. 7. Heterogeneity of the prostate gland is nonspecific. Correlate with outpatient prostate exam and PSA as clinically warranted. 8. Aortic Atherosclerosis (ICD10-I70.0). Electronically Signed   By: Lovena Le M.D.   On: 08/03/2020 18:44   US Paracentesis  Result Date: 08/04/2020 INDICATION: Ascites, cancer of the gastroesophageal junction EXAM: ULTRASOUND GUIDED DIAGNOSTIC AND THERAPEUTIC PARACENTESIS MEDICATIONS: None COMPLICATIONS: None immediate PROCEDURE: Informed written consent was obtained from the patient after a discussion of the risks, benefits and alternatives to treatment. A timeout was performed prior to the initiation of the procedure. Initial ultrasound scanning demonstrates a large amount of ascites within the right lower abdominal quadrant. The right lower abdomen was prepped and draped in the usual sterile fashion. 1% lidocaine was used for local anesthesia. Following this, a 5 [place] Yueh catheter was  introduced. An ultrasound image was saved for documentation purposes. The paracentesis was performed. The catheter was removed and a dressing was applied. The patient tolerated the procedure well without immediate post procedural complication. Patient received post-procedure intravenous albumin; see nursing notes for details. FINDINGS: A total of approximately 3.75 L of serous sanguinous fluid was removed. Samples were sent to the laboratory as requested by the clinical team. IMPRESSION: Successful ultrasound-guided paracentesis yielding 3.75 liters of peritoneal fluid. Electronically Signed   By: Lavonia Dana M.D.   On: 08/04/2020 16:13     ASSESSMENT:  1. 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 HER-2 FISH to be followed up. PD-L1 CPS-5%. -FOLFOX and Opdivo started on 12/31/2019. -PET scan on 03/22/2020 showed marked reduction in size and metabolic activity in the distal esophageal lesion, marked reduction in size of mediastinal adenopathy. Near complete resolution with only 2 small mildly metabolic mediastinal lymph nodes remaining. Resolution of lymph nodes in the upper abdomen. Right hip bone lesion is still present but better. -CT CAP on 06/09/2020 showed interval resolution of previously demonstrated tree-in-bud nodularity in the lungs. Stable enlarged subcarinal lymph node and small residual lymph nodes with the gastrohepatic ligament and porta hepatis unchanged from the most recent PET scan. -CTAP on 08/03/2020 shows interval development of extensive omental caking particularly in the right upper quadrant with a more conglomerate soft tissue attenuation and large volume ascites.  Irregular thickening in the distal esophagus incompletely assessed.  Enlarging subcarinal and gastrohepatic nodes.   PLAN:  1. Stage IV GE junction adenocarcinoma: -I reviewed his CBC which was grossly normal.  LFTs are normal.  Albumin is low at 3.0. -We discussed about  starting him  on first cycle of paclitaxel and Cyramza today. -We discussed side effects in detail. -We will proceed with first cycle today.  Plan to see him back next week.  2. Hoarseness of voice: -Followed by ENT at [facility] on 08/19/2020 for injection for left vocal cord paralysis.  3.Weight loss: -He was not able to use the PEG tube fully because of distention.  Hopefully he will be back to his feeding tube schedule after paracentesis today.  4. Bone metastasis: -Continue monthly Zometa.  5. Diabetes: -Continue Metformin.  6. Hypertension: -We will hold Lopressor if systolic blood pressure less than 110.  7.Anxiety: -Continue Xanax 0.5 mg as needed.  8.  Ascites: -Last paracentesis was on 08/04/2020.  He reports that fluid already reaccumulated. -Cytology was positive for malignant cells consistent with metastatic adenocarcinoma. -We will arrange for another paracentesis today.  Continue Lasix 20 mg daily.  9.  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  We will closely monitor as he is receiving paclitaxel. -We will make a referral to physical therapy.   Orders placed this encounter:  No orders of the defined types were placed in this encounter.    Derek Jack, MD [place] [phone]   I, Milinda Antis, am acting as a scribe for Dr. Sanda Linger.  I, Derek Jack MD, have reviewed the above documentation for accuracy and completeness, and I agree with the above.

## 2020-08-11 NOTE — Procedures (Signed)
PreOperative Dx: Malignant ascites, adenoCa of the GE junction Postoperative Dx: Malignant ascites, adenoCa of the GE junction Procedure:   US guided paracentesis Radiologist:  Thornton Papas Anesthesia:  10 ml of1% lidocaine Specimen:  5.8 L of yellow ascitic fluid EBL:   < 1 ml Complications: None

## 2020-08-11 NOTE — Patient Instructions (Signed)
[facility] Discharge Instructions for Patients Receiving Chemotherapy  Today you received the following chemotherapy agents   To help prevent nausea and vomiting after your treatment, we encourage you to take your nausea medication   If you develop nausea and vomiting that is not controlled by your nausea medication, call the clinic.   BELOW ARE SYMPTOMS THAT SHOULD BE REPORTED IMMEDIATELY:  *FEVER GREATER THAN 100.5 F  *CHILLS WITH OR WITHOUT FEVER  NAUSEA AND VOMITING THAT IS NOT CONTROLLED WITH YOUR NAUSEA MEDICATION  *UNUSUAL SHORTNESS OF BREATH  *UNUSUAL BRUISING OR BLEEDING  TENDERNESS IN MOUTH AND THROAT WITH OR WITHOUT PRESENCE OF ULCERS  *URINARY PROBLEMS  *BOWEL PROBLEMS  UNUSUAL RASH Items with * indicate a potential emergency and should be followed up as soon as possible.  Feel free to call the clinic should you have any questions or concerns. The clinic phone number is [phone].  Please show the CHEMO ALERT CARD at check-in to the Emergency Department and triage nurse.   

## 2020-08-11 NOTE — Progress Notes (Signed)
Pt here for D1C1, cyramza and taxol.  Chemo care information provided to pt.  Consent obtained. All questions answered.  Protein in urine 100, labs reviewed with Dr Raliegh Ip.  Faythe Ghee to proceed with treatment.  527mL NS bolus given.  Pt taken down to radiology for paracentesis prior to administering chemotherapy.   When pt returned he stated he just feels worn down.   Tolerated treatment well today.  Discharged via wheelchair.  Vital signs stable prior to discharge.

## 2020-08-11 NOTE — Progress Notes (Signed)
   Patient was assessed by Dr. Delton Coombes and labs have been reviewed.  Patient has developed foot drop, per Dr. Delton Coombes we will refer to physical therapy for boot placement and physical therapy. Protein in urine is 100, okay to proceed today, we will monitor with next treatment.  We will give patient additional normal saline 500 ml over 30 minutes today as well.  Patient is okay to proceed with treatment today. Primary RN and pharmacy aware.

## 2020-08-12 ENCOUNTER — Telehealth (HOSPITAL_COMMUNITY): Payer: Self-pay | Admitting: Emergency Medicine

## 2020-08-12 NOTE — Telephone Encounter (Signed)
24 hour call back.  Left message checking on patient.  Explained to call if he had any questions or concerns.

## 2020-08-13 ENCOUNTER — Encounter (HOSPITAL_COMMUNITY): Payer: Self-pay

## 2020-08-13 ENCOUNTER — Encounter (HOSPITAL_COMMUNITY): Payer: Medicare HMO

## 2020-08-13 ENCOUNTER — Inpatient Hospital Stay (HOSPITAL_COMMUNITY): Payer: Medicare HMO

## 2020-08-13 NOTE — Progress Notes (Signed)
Nutrition  Called wife for nutrition follow-up.  No answer. Left message with call back number.  Rhiley Solem B. Zenia Resides, [place], [place] Registered Dietitian [phone] (mobile)

## 2020-08-13 NOTE — Progress Notes (Signed)
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  Noted per MD note progression with significant omental involvement.  Patient changing treatment to paclitaxel and cyramza.    Wife returned RD's call.  Wife reports that patient will have voicebox procedure on 9/16.  Patient is taking some water by mouth but really nothing else.  Patient is getting in 6 cartons of osmolite 1.5 but unable to do that until after paracentesis.  Paracentesis done on 9/1 3.75 liters removed and 9/8 5.8 L removed.     Medications: reviewed  Labs: reviewed  Anthropometrics:   Weight 195 lb 11.2 oz on 9/8   Estimated Energy Needs   Kcals: 8527-7824 Protein: 120-140 g Fluid: > 2.4 L  NUTRITION DIAGNOSIS: Inadequate oral intake continues relying on feeding tube    INTERVENTION:  Continue osmolite 1.5, 6-8 cartons per day as tolerated.  Discussed with wife spacing feedings out as patient has been giving 2 cartons at a time.  Especially with fluid accumulation may need to give 1 carton more frequently vs 2 at one time.  Wife says that they have plenty of supply of formula. Wife has contact information    MONITORING, EVALUATION, GOAL: weight trends, intake, tube feeding   NEXT VISIT: Sept 24 phone f/u  Timothy Oconnor, [place], [place] Registered Dietitian [phone] (mobile)

## 2020-08-18 ENCOUNTER — Other Ambulatory Visit (HOSPITAL_COMMUNITY): Payer: Medicare HMO

## 2020-08-18 ENCOUNTER — Ambulatory Visit (HOSPITAL_COMMUNITY): Payer: Medicare HMO | Admitting: Hematology

## 2020-08-18 ENCOUNTER — Inpatient Hospital Stay (HOSPITAL_COMMUNITY): Payer: Medicare HMO

## 2020-08-18 ENCOUNTER — Ambulatory Visit (HOSPITAL_COMMUNITY): Payer: Medicare HMO

## 2020-08-18 ENCOUNTER — Inpatient Hospital Stay (HOSPITAL_COMMUNITY): Payer: Medicare HMO | Admitting: Hematology

## 2020-08-18 ENCOUNTER — Other Ambulatory Visit: Payer: Self-pay

## 2020-08-18 ENCOUNTER — Ambulatory Visit (HOSPITAL_COMMUNITY)
Admission: RE | Admit: 2020-08-18 | Discharge: 2020-08-18 | Disposition: A | Discharge status: Home / Self Care | Payer: Medicare HMO | Source: Ambulatory Visit | Attending: Hematology | Admitting: Hematology

## 2020-08-18 ENCOUNTER — Other Ambulatory Visit (HOSPITAL_COMMUNITY): Payer: Self-pay | Admitting: Hematology

## 2020-08-18 ENCOUNTER — Encounter (HOSPITAL_COMMUNITY): Payer: Self-pay

## 2020-08-18 VITALS — BP 94/59 | HR 86 | Temp 97.7°F | Resp 18 | Wt 185.0 lb

## 2020-08-18 DIAGNOSIS — R180 Malignant ascites: Secondary | ICD-10-CM

## 2020-08-18 DIAGNOSIS — C160 Malignant neoplasm of cardia: Secondary | ICD-10-CM

## 2020-08-18 DIAGNOSIS — Z5111 Encounter for antineoplastic chemotherapy: Secondary | ICD-10-CM | POA: Diagnosis not present

## 2020-08-18 DIAGNOSIS — Z95828 Presence of other vascular implants and grafts: Secondary | ICD-10-CM

## 2020-08-18 LAB — CBC WITH DIFFERENTIAL/PLATELET
Abs Immature Granulocytes: 0.01 10*3/uL (ref 0.00–0.07)
Basophils Absolute: 0 10*3/uL (ref 0.0–0.1)
Basophils Relative: 1 %
Eosinophils Absolute: 0.1 10*3/uL (ref 0.0–0.5)
Eosinophils Relative: 2 %
HCT: 33.2 % — ABNORMAL LOW (ref 39.0–52.0)
Hemoglobin: 10.8 g/dL — ABNORMAL LOW (ref 13.0–17.0)
Immature Granulocytes: 0 %
Lymphocytes Relative: 41 %
Lymphs Abs: 1.9 10*3/uL (ref 0.7–4.0)
MCH: 30.8 pg (ref 26.0–34.0)
MCHC: 32.5 g/dL (ref 30.0–36.0)
MCV: 94.6 fL (ref 80.0–100.0)
Monocytes Absolute: 0.5 10*3/uL (ref 0.1–1.0)
Monocytes Relative: 11 %
Neutro Abs: 2.1 10*3/uL (ref 1.7–7.7)
Neutrophils Relative %: 45 %
Platelets: 402 10*3/uL — ABNORMAL HIGH (ref 150–400)
RBC: 3.51 MIL/uL — ABNORMAL LOW (ref 4.22–5.81)
RDW: 14.8 % (ref 11.5–15.5)
WBC: 4.7 10*3/uL (ref 4.0–10.5)
nRBC: 0 % (ref 0.0–0.2)

## 2020-08-18 LAB — COMPREHENSIVE METABOLIC PANEL
ALT: 40 U/L (ref 0–44)
AST: 33 U/L (ref 15–41)
Albumin: 2.7 g/dL — ABNORMAL LOW (ref 3.5–5.0)
Alkaline Phosphatase: 55 U/L (ref 38–126)
Anion gap: 8 (ref 5–15)
BUN: 17 mg/dL (ref 8–23)
CO2: 26 mmol/L (ref 22–32)
Calcium: 8.7 mg/dL — ABNORMAL LOW (ref 8.9–10.3)
Chloride: 99 mmol/L (ref 98–111)
Creatinine, Ser: 0.66 mg/dL (ref 0.61–1.24)
GFR calc Af Amer: >60 mL/min (ref >60)
GFR calc non Af Amer: >60 mL/min (ref >60)
Glucose, Bld: 192 mg/dL — ABNORMAL HIGH (ref 70–99)
Potassium: 4.1 mmol/L (ref 3.5–5.1)
Sodium: 133 mmol/L — ABNORMAL LOW (ref 135–145)
Total Bilirubin: 0.2 mg/dL — ABNORMAL LOW (ref 0.3–1.2)
Total Protein: 6.7 g/dL (ref 6.5–8.1)

## 2020-08-18 MED ORDER — HEPARIN SOD (PORK) LOCK FLUSH 100 UNIT/ML IV SOLN
500.0000 [IU] | Freq: Once | INTRAVENOUS | Status: AC
  Administered 2020-08-18: 500 [IU] via INTRAVENOUS

## 2020-08-18 MED ORDER — SODIUM CHLORIDE 0.9% FLUSH
20.0000 mL | INTRAVENOUS | Status: DC | PRN
  Administered 2020-08-18: 20 mL via INTRAVENOUS

## 2020-08-18 MED ORDER — SODIUM CHLORIDE 0.9 % IV SOLN: INTRAVENOUS | Status: DC

## 2020-08-18 NOTE — Patient Instructions (Signed)
[facility] at [facility] Discharge Instructions  Labs drawn from portacath today   Thank you for choosing [facility] at [facility] to provide your oncology and hematology care.  To afford each patient quality time with our provider, please arrive at least 15 minutes before your scheduled appointment time.   If you have a lab appointment with the Cancer Center please come in thru the Main Entrance and check in at the main information desk.  You need to re-schedule your appointment should you arrive 10 or more minutes late.  We strive to give you quality time with our providers, and arriving late affects you and other patients whose appointments are after yours.  Also, if you no show three or more times for appointments you may be dismissed from the clinic at the providers discretion.     Again, thank you for choosing [facility].  Our hope is that these requests will decrease the amount of time that you wait before being seen by our physicians.       _____________________________________________________________  Should you have questions after your visit to  Cancer Center, please contact our office at [phone] and follow the prompts.  Our office hours are 8:00 a.m. and 4:30 p.m. Monday - Friday.  Please note that voicemails left after 4:00 p.m. may not be returned until the following business day.  We are closed weekends and major holidays.  You do have access to a nurse 24-7, just call the main number to the clinic [phone] and do not press any options, hold on the line and a nurse will answer the phone.    For prescription refill requests, have your pharmacy contact our office and allow 72 hours.    Due to Covid, you will need to wear a mask upon entering the hospital. If you do not have a mask, a mask will be given to you at the Main Entrance upon arrival. For doctor visits, patients may have 1 support person age 18  or older with them. For treatment visits, patients can not have anyone with them due to social distancing guidelines and our immunocompromised population.     

## 2020-08-18 NOTE — Progress Notes (Signed)
  Scheduled today for OP paracentesis  [place] Abd limited: No fluid visualized with [place] all 4 quadrants No paracentesis performed today

## 2020-08-18 NOTE — Progress Notes (Signed)
[place], [place] [address]   CLINIC:  Medical Oncology/Hematology  PCP:  Timothy Percy, MD [address] 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  PRIOR THERAPY: FOLFOX and nivolumab x 14 cycles from 12/31/2019 to 07/26/2020  NGS Results: PD-L1 CPS 5%, Foundation 1 MS--stable, TMB 8 Muts/Mb  CURRENT THERAPY: Paclitaxel 3 weeks on, 1 week off; ramucirumab  BRIEF ONCOLOGIC HISTORY:  Oncology History  Esophageal cancer ([place])   Initial Diagnosis   Esophageal cancer ([place])    Genetic Testing   Foundation One and PDL-1 Results:       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[place])  12/24/2019 Initial Diagnosis   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[place])   12/29/2019 Cancer Staging   Staging form: Esophagus - Adenocarcinoma, AJCC 8th Edition - Clinical stage from 12/29/2019: Stage IVB (cTX, cN3, cM1) - Signed by Timothy Jack, MD on 12/29/2019   12/31/2019 - 07/28/2020 Chemotherapy   The patient had palonosetron (ALOXI) injection 0.25 mg, 0.25 mg, Intravenous,  Once, 14 of 16 cycles Administration: 0.25 mg (12/31/2019), 0.25 mg (01/14/2020), 0.25 mg (01/28/2020), 0.25 mg (02/11/2020), 0.25 mg (02/25/2020), 0.25 mg (03/10/2020), 0.25 mg (03/24/2020), 0.25 mg (04/12/2020), 0.25 mg (04/26/2020), 0.25 mg (05/10/2020), 0.25 mg (05/24/2020), 0.25 mg (06/28/2020), 0.25 mg (07/12/2020), 0.25 mg (07/26/2020) leucovorin 868 mg in dextrose 5 % 250 mL infusion, 400 mg/m2 = 868 mg, Intravenous,  Once, 14 of 16 cycles Administration: 868 mg (12/31/2019), 868 mg (01/14/2020), 868 mg (01/28/2020), 868 mg (02/11/2020), 868 mg (02/25/2020), 868 mg (03/10/2020), 868 mg (03/24/2020), 868 mg (04/12/2020), 868 mg (04/26/2020), 868 mg (05/10/2020), 868 mg (05/24/2020), 868 mg (06/28/2020), 868 mg (07/12/2020), 868 mg (07/26/2020) oxaliplatin (ELOXATIN) 150 mg in dextrose 5 % 500 mL chemo infusion, 68 mg/m2 = 150 mg (80 %  of original dose 85 mg/m2), Intravenous,  Once, 13 of 15 cycles Dose modification: 68 mg/m2 (80 % of original dose 85 mg/m2, Cycle 1, Reason: Other (see comments), Comment: diabetic neuropathy) Administration: 150 mg (12/31/2019), 150 mg (01/14/2020), 150 mg (01/28/2020), 150 mg (02/11/2020), 150 mg (02/25/2020), 150 mg (03/10/2020), 150 mg (03/24/2020), 150 mg (04/12/2020), 150 mg (04/26/2020), 150 mg (05/10/2020), 150 mg (05/24/2020), 150 mg (06/28/2020), 150 mg (07/12/2020) fluorouracil (ADRUCIL) chemo injection 850 mg, 400 mg/m2 = 850 mg, Intravenous,  Once, 14 of 16 cycles Administration: 850 mg (12/31/2019), 850 mg (01/14/2020), 850 mg (01/28/2020), 850 mg (02/11/2020), 850 mg (02/25/2020), 850 mg (03/10/2020), 850 mg (03/24/2020), 850 mg (04/12/2020), 850 mg (04/26/2020), 850 mg (05/10/2020), 850 mg (05/24/2020), 850 mg (06/28/2020), 850 mg (07/26/2020) fluorouracil (ADRUCIL) 5,000 mg in sodium chloride 0.9 % 150 mL chemo infusion, 2,310 mg/m2 = 5,200 mg, Intravenous, 1 Day/Dose, 14 of 16 cycles Administration: 5,000 mg (12/31/2019), 5,000 mg (01/14/2020), 5,000 mg (01/28/2020), 5,000 mg (02/11/2020), 5,000 mg (02/25/2020), 5,000 mg (03/10/2020), 5,000 mg (03/24/2020), 5,000 mg (04/12/2020), 5,000 mg (04/26/2020), 5,000 mg (05/10/2020), 5,000 mg (05/24/2020), 5,200 mg (06/28/2020), 5,200 mg (07/26/2020) nivolumab (OPDIVO) 240 mg in sodium chloride 0.9 % 100 mL chemo infusion, 240 mg (100 % of original dose 240 mg), Intravenous, Once, 14 of 16 cycles Dose modification: 240 mg (original dose 240 mg, Cycle 1, Reason: Provider Judgment) Administration: 240 mg (12/31/2019), 240 mg (01/14/2020), 240 mg (01/28/2020), 240 mg (02/11/2020), 240 mg (02/25/2020), 240 mg (03/10/2020), 240 mg (03/24/2020), 240 mg (04/12/2020), 240 mg (04/26/2020), 240 mg (05/10/2020), 240 mg (05/24/2020), 240 mg (06/28/2020), 240 mg (07/12/2020), 240 mg (  07/26/2020)  for chemotherapy treatment.     Timothy Oconnor One and PDL-1 Results:       08/11/2020 -  Chemotherapy    The patient had PACLitaxel (TAXOL) 168 mg in sodium chloride 0.9 % 250 mL chemo infusion (</= 63m/m2), 80 mg/m2 = 168 mg, Intravenous,  Once, 1 of 6 cycles Administration: 168 mg (08/11/2020) ramucirumab (CYRAMZA) 700 mg in sodium chloride 0.9 % 180 mL chemo infusion, 8 mg/kg = 700 mg, Intravenous, Once, 1 of 6 cycles Administration: 700 mg (08/11/2020)  for chemotherapy treatment.      CANCER STAGING: Cancer Staging 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[facility], In Staging form: Esophagus - Adenocarcinoma, AJCC 8th Edition - Clinical stage from 12/29/2019: Stage IVB (cTX, cN3, cM1) - Signed by Timothy Jack MD on 12/29/2019   INTERVAL HISTORY:  [name] a 69y.o. male, returns for routine follow-up of his stage IV GE junction adenocarcinoma. RAdvitwas last seen on 08/11/2020.   Today he reports that he did not have a paracentesis performed today. He still feels weak, but better than last week. He continues taking Boost, Ensure and Osmolite through the PEG tube, usually 6 cans daily; he does not have any issues pushing the tube feeds. He tolerated the treatment last week very well and had 4 episodes of watery soft diarrhea without taking Imodium, but denies worsening in his numbness of his fingertips and feet. He will see the PT on 9/20.  He is scheduled for his medialization laryngoplasty with Timothy Oconnor 9/16.   REVIEW OF SYSTEMS:  Review of Systems  Constitutional: Positive for appetite change (depleted) and fatigue (severe).  HENT:   Positive for trouble swallowing (solids & liquids).   Respiratory: Positive for cough.   Gastrointestinal: Positive for diarrhea.  Neurological: Positive for numbness (fingertips & feet).  Psychiatric/Behavioral: Positive for sleep disturbance.  All other systems reviewed and are negative.   PAST MEDICAL/SURGICAL HISTORY:  Past Medical History:  Diagnosis Date  . 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[place]   . Anxiety   .  Diabetes ([place]   . Heart attack ([place]   . Heart disease   . High cholesterol   . Hyperplasia of prostate   . Hypertension   . Low back pain   . Port-A-Cath in place 12/30/2019  . Tremor    Past Surgical History:  Procedure Laterality Date  . arterectomy     1993  . BIOPSY  12/01/2019   Procedure: BIOPSY;  Surgeon: RRogene Houston MD;  Location: [facility];  Service: Endoscopy;;  esophagus  . ESOPHAGOGASTRODUODENOSCOPY (EGD) WITH PROPOFOL N/A 12/01/2019   Procedure: ESOPHAGOGASTRODUODENOSCOPY (EGD) WITH PROPOFOL;  Surgeon: RRogene Houston MD;  Location: [facility];  Service: Endoscopy;  Laterality: N/A;  . ESOPHAGOGASTRODUODENOSCOPY (EGD) WITH PROPOFOL N/A 12/24/2019   Procedure: ESOPHAGOGASTRODUODENOSCOPY (EGD) WITH PROPOFOL;  Surgeon: JAviva Signs MD;  Location: [facility] ORS;  Service: General;  Laterality: N/A;  . Heart stents     2002, 2004, 2006  . PEG PLACEMENT N/A 12/24/2019   Procedure: PERCUTANEOUS ENDOSCOPIC GASTROSTOMY (PEG) PLACEMENT;  Surgeon: JAviva Signs MD;  Location: [facility] ORS;  Service: General;  Laterality: N/A;  . PORTACATH PLACEMENT Left 12/24/2019   Procedure: INSERTION PORT-A-CATH;  Surgeon: JAviva Signs MD;  Location: [facility] ORS;  Service: General;  Laterality: Left;    SOCIAL HISTORY:  Social History   Socioeconomic History  . Marital status: Married    Spouse name: Not on file  . Number of children:  2  . Years of education: 16  . Highest education level: Not on file  Occupational History  . Occupation: [employer]    Comment: Delivers medication  Tobacco Use  . Smoking status: Former Smoker    Packs/day: 1.00    Years: 50.00    Pack years: 50.00    Quit date: 12/21/2017    Years since quitting: 2.6  . Smokeless tobacco: Never Used  . Tobacco comment: Quit 2017  Vaping Use  . Vaping Use: Never used  Substance and Sexual Activity  . Alcohol use: Yes    Alcohol/week: 0.0 standard drinks    Comment: 3 drinks per week  . Drug use: No  . Sexual  activity: Yes  Other Topics Concern  . Not on file  Social History Narrative   Lives at home with his wife.   Right-handed.   2 diet cokes per day.   Social Determinants of Health   Financial Resource Strain: Low Risk   . Difficulty of Paying Living Expenses: Not hard at all  Food Insecurity: No Food Insecurity  . Worried About Charity fundraiser in the Last Year: Never true  . Ran Out of Food in the Last Year: Never true  Transportation Needs: No Transportation Needs  . Lack of Transportation (Medical): No  . Lack of Transportation (Non-Medical): No  Physical Activity: Insufficiently Active  . Days of Exercise per Week: 2 days  . Minutes of Exercise per Session: 30 min  Stress: No Stress Concern Present  . Feeling of Stress : Only a little  Social Connections: Socially Integrated  . Frequency of Communication with Friends and Family: More than three times a week  . Frequency of Social Gatherings with Friends and Family: More than three times a week  . Attends Religious Services: 1 to 4 times per year  . Active Member of Clubs or Organizations: No  . Attends Archivist Meetings: 1 to 4 times per year  . Marital Status: Married  Human resources officer Violence: Not At Risk  . Fear of Current or Ex-Partner: No  . Emotionally Abused: No  . Physically Abused: No  . Sexually Abused: No    FAMILY HISTORY:  Family History  Problem Relation Age of Onset  . Alzheimer's disease Mother   . Diabetes Father   . Heart disease Father   . Stroke Father   . Heart disease Brother   . Colon cancer Brother   . Alzheimer's disease Sister     CURRENT MEDICATIONS:  Current Outpatient Medications  Medication Sig Dispense Refill  . aspirin EC 81 MG tablet Take 81 mg by mouth daily.     Marland Kitchen atorvastatin (LIPITOR) 80 MG tablet Take 80 mg by mouth every evening.     . ezetimibe (ZETIA) 10 MG tablet Take 10 mg by mouth daily.     . folic acid (FOLVITE) 161 MCG tablet Take 800 mcg by  mouth daily.     . isosorbide mononitrate (IMDUR) 60 MG 24 hr tablet Take 60 mg by mouth daily.    . Misc. Devices MISC Please provide osmolite 1.5.  2 cartons four times daily via feeding tube (8 daily total).  Flush with 65m water before and after administration of osmolite. 1 each 0  . PACLitaxel (TAXOL IV) Inject into the vein.    . Ramucirumab (CYRAMZA IV) Inject into the vein.    . tamsulosin (FLOMAX) 0.4 MG CAPS capsule Take 0.4 mg by mouth daily.     .Marland Kitchen  ALPRAZolam (XANAX) 0.5 MG tablet Take 1 tablet (0.5 mg total) by mouth 2 (two) times daily as needed for anxiety. (Patient not taking: Reported on 08/18/2020) 30 tablet 0  . furosemide (LASIX) 20 MG tablet Take 1 tablet (20 mg total) by mouth daily. Do not take if the upper number of your BP is less than 110. (Patient not taking: Reported on 08/18/2020) 30 tablet 2  . losartan (COZAAR) 50 MG tablet Take 25 mg by mouth daily.  (Patient not taking: Reported on 08/18/2020)    . metFORMIN (GLUCOPHAGE-XR) 500 MG 24 hr tablet Take 500 mg by mouth daily.  (Patient not taking: Reported on 08/18/2020)    . metoprolol tartrate (LOPRESSOR) 25 MG tablet Take 0.5 tablets (12.5 mg total) by mouth 2 (two) times daily. (Patient not taking: Reported on 08/18/2020) 60 tablet 1  . Multiple Vitamin (MULTI-VITAMINS) TABS Take 1 tablet by mouth daily.  (Patient not taking: Reported on 08/18/2020)    . Oxycodone HCl 10 MG TABS Take 1 tablet (10 mg total) by mouth at bedtime as needed. (Patient not taking: Reported on 08/18/2020) 30 tablet 0   No current facility-administered medications for this visit.   Facility-Administered Medications Ordered in Other Visits  Medication Dose Route Frequency Provider Last Rate Last Admin  . sodium chloride flush (NS) 0.9 % injection 20 mL  20 mL Intravenous PRN Timothy Jack, MD   20 mL at 08/18/20 1345    ALLERGIES:  No Known Allergies  PHYSICAL EXAM:  Performance status (ECOG): 1 - Symptomatic but completely  ambulatory  Vitals:   08/18/20 1351  BP: (!) 94/59  Pulse: 86  Resp: 18  Temp: 97.7 F (36.5 C)  SpO2: 98%   Wt Readings from Last 3 Encounters:  08/18/20 184 lb 15.5 oz (83.9 kg)  08/11/20 195 lb 11.2 oz (88.8 kg)  08/04/20 202 lb 6.4 oz (91.8 kg)   Physical Exam Vitals reviewed.  Constitutional:      Appearance: Normal appearance.  Cardiovascular:     Rate and Rhythm: Normal rate and regular rhythm.     Pulses: Normal pulses.     Heart sounds: Normal heart sounds.  Pulmonary:     Effort: Pulmonary effort is normal.     Breath sounds: Normal breath sounds.  Chest:     Comments: Port-a-Cath in L chest Abdominal:     Palpations: Abdomen is soft. There is no hepatomegaly or mass.     Tenderness: There is no abdominal tenderness.  Neurological:     General: No focal deficit present.     Mental Status: He is alert and oriented to person, place, and time.  Psychiatric:        Mood and Affect: Mood normal.        Behavior: Behavior normal.      LABORATORY DATA:  I have reviewed the labs as listed.  CBC Latest Ref Rng & Units 08/18/2020 08/11/2020 08/03/2020  WBC 4.0 - 10.5 K/uL 4.7 6.9 7.2  Hemoglobin 13.0 - 17.0 g/dL 10.8(L) 11.9(L) 12.4(L)  Hematocrit 39 - 52 % 33.2(L) 36.3(L) 38.0(L)  Platelets 150 - 400 K/uL 402(H) 436(H) 411(H)   CMP Latest Ref Rng & Units 08/11/2020 08/03/2020 07/26/2020  Glucose 70 - 99 mg/dL 189(H) 116(H) 195(H)  BUN 8 - 23 mg/dL _0 Creatinine 0.61 - 1.24 mg/dL 0.99 0.93 0.88  Sodium 135 - 145 mmol/L 134(L) 133(L) 132(L)  Potassium 3.5 - 5.1 mmol/L 4.3 4.0 4.4  Chloride 98 - 111  mmol/L 95(L) 95(L) 95(L)  CO2 22 - 32 mmol/L _0 Calcium 8.9 - 10.3 mg/dL 9.4 8.6(L) 9.1  Total Protein 6.5 - 8.1 g/dL 6.8 8.0 7.0  Total Bilirubin 0.3 - 1.2 mg/dL 0.8 0.6 0.5  Alkaline Phos 38 - 126 U/L 48 48 51  AST 15 - 41 U/L _1 ALT 0 - 44 U/L 20 33 30   Lab Results  Component Value Date   LDH 95 (L) 08/11/2020   LDH 116 07/26/2020   LDH  196 (H) 07/16/2020    DIAGNOSTIC IMAGING:  I have independently reviewed the scans and discussed with the patient. CT ABDOMEN PELVIS W CONTRAST  Result Date: 08/03/2020 CLINICAL DATA:  Abdominal distension, reflux from feeding tube, history of gastroesophageal cancer, ongoing chemotherapy contrast injected into feeding tube EXAM: CT ABDOMEN AND PELVIS WITH CONTRAST TECHNIQUE: Multidetector CT imaging of the abdomen and pelvis was performed using the standard protocol following bolus administration of intravenous contrast. CONTRAST:  140m OMNIPAQUE IOHEXOL 300 MG/ML  SOLN COMPARISON:  CT chest, abdomen and pelvis 06/09/2020, PET-CT 03/22/2020 FINDINGS: Lower chest: There are bilateral pleural effusions. Bandlike areas of opacity likely reflecting subsegmental atelectasis and/or scarring. Cardiac size within normal limits. Coronary artery calcifications are present. Small pericardial effusion. Hepatobiliary: Insert normal liver layering attenuation within the gallbladder, could reflect some biliary sludge. No visible calcified gallstones or frank gallbladder wall thickening. No biliary ductal dilatation. Pancreas: Unremarkable. No pancreatic ductal dilatation or surrounding inflammatory changes. Spleen: Normal in size. No concerning splenic lesions. Adrenals/Urinary Tract: Normal adrenal glands. Kidneys enhance and excrete uniformly and symmetrically. 3 cm fluid attenuation cyst seen in the upper pole left kidney. Additional subcentimeter hypodense foci in the left kidney too small to fully characterize on CT imaging but statistically likely benign. No concerning renal masses. No urolithiasis or hydronephrosis. No urolithiasis or hydronephrosis. Indentation of bladder base by an enlarged, nodular prostate. Mild circumferential bladder wall thickening, nonspecific. Stomach/Bowel: Irregular thickening in the distal thoracic esophagus, incompletely assessed given underdistention and lack of luminal contrast  media. No high attenuation contrast material is seen refluxing into the esophagus though the dilute appearance of the contrast may limit sensitivity. Percutaneous gastrostomy tube is in place. Inflated balloon within the gastric lumen at the antrum. Mild circumferential thickening towards the gastric antrum/pylorus likely related to peristalsis/normal contraction. Duodenum is unremarkable. Mild diffuse thickening of the small bowel, nonspecific given the presence of extensive large volume ascites throughout the abdomen and pelvis. No dilatation A normal appendix is visualized. No colonic dilatation or wall thickening. Scattered colonic diverticula without focal inflammation to suggest diverticulitis. Vascular/Lymphatic: Atherosclerotic calcifications within the abdominal aorta and branch vessels. No aneurysm or ectasia. No enlarged abdominopelvic lymph nodes. Enlarging subcarinal node now measuring up to 15 mm, previously 13 mm. Enlarging gastrohepatic nodes are noted as well, largest measuring up to 16 mm, not seen on prior examinations. Reproductive: Prostatomegaly. Heterogeneous nodularity of the prostate is nonspecific. Other: Interval development of extensive omental caking particularly in the right upper quadrant with a more conglomerate soft tissue attenuation measuring up to 4.2 x 9.6 x 5.4 cm in maximal AP by transverse by craniocaudal dimensions. Adherent loops of bowel without evidence of resulting obstruction. Interval development of a large volume ascites of low to intermediate attenuation measuring up to 20 HU. No abdominopelvic free air. No bowel containing hernias. Fat containing umbilical hernia containing some omental nodularity as well (2/63). Diffuse mild body wall edema including more focal skin thickening about  the umbilicus and inferior abdominal panniculus. Musculoskeletal: No acute osseous abnormality or suspicious osseous lesion. Multilevel degenerative changes are present in the imaged  portions of the spine. Additional moderate degenerative changes in the bilateral hips and pelvis. Enthesopathic changes noted about the pelvis and proximal femora as well. IMPRESSION: 1. Interval development of extensive omental caking particularly in the right upper quadrant with a more conglomerate soft tissue attenuation and within a fat containing umbilical hernia. 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 2. No high attenuation contrast material is seen refluxing into the esophagus though the dilute appearance of the contrast may limit sensitivity.Percutaneous gastrostomy tube is in place. Inflated balloon within the gastric lumen at the antrum. 3. Irregular thickening in the distal thoracic esophagus, incompletely assessed given underdistention and lack of luminal contrast media. 4. Enlarging subcarinal and gastrohepatic nodes, concerning for progression of nodal metastatic disease. 5. Bilateral pleural effusions, small pericardial effusion, and diffuse mild body wall edema. 6. Mild circumferential bladder wall thickening, nonspecific. Possibly related to chronic outlet obstruction with a heterogeneous, enlarged prostate though should correlate with urinalysis to exclude cystitis. 7. Heterogeneity of the prostate gland is nonspecific. Correlate with outpatient prostate exam and PSA as clinically warranted. 8. Aortic Atherosclerosis (ICD10-I70.0). Electronically Signed   By: Lovena Le M.D.   On: 08/03/2020 18:44   US Paracentesis  Result Date: 08/11/2020 INDICATION: Malignant ascites, adenocarcinoma of the gastroesophageal junction EXAM: ULTRASOUND GUIDED THERAPEUTIC PARACENTESIS MEDICATIONS: None COMPLICATIONS: None immediate PROCEDURE: Informed written consent was obtained from the patient after a discussion of the risks, benefits and alternatives to treatment. A timeout was performed prior to the initiation of the procedure. Initial ultrasound scanning demonstrates a large  amount of ascites within the right lower abdominal quadrant. The right lower abdomen was prepped and draped in the usual sterile fashion. 1% lidocaine was used for local anesthesia. Following this, a 5 [place] Yueh catheter was introduced. An ultrasound image was saved for documentation purposes. The paracentesis was performed. The catheter was removed and a dressing was applied. The patient tolerated the procedure well without immediate post procedural complication. Patient received post-procedure intravenous albumin; see nursing notes for details. FINDINGS: A total of approximately 5.8 L of yellow fluid was removed. IMPRESSION: Successful ultrasound-guided paracentesis yielding 5.8 liters of peritoneal fluid. Electronically Signed   By: Lavonia Dana M.D.   On: 08/11/2020 12:52   US Paracentesis  Result Date: 08/04/2020 INDICATION: Ascites, cancer of the gastroesophageal junction EXAM: ULTRASOUND GUIDED DIAGNOSTIC AND THERAPEUTIC PARACENTESIS MEDICATIONS: None COMPLICATIONS: None immediate PROCEDURE: Informed written consent was obtained from the patient after a discussion of the risks, benefits and alternatives to treatment. A timeout was performed prior to the initiation of the procedure. Initial ultrasound scanning demonstrates a large amount of ascites within the right lower abdominal quadrant. The right lower abdomen was prepped and draped in the usual sterile fashion. 1% lidocaine was used for local anesthesia. Following this, a 5 [place] Yueh catheter was introduced. An ultrasound image was saved for documentation purposes. The paracentesis was performed. The catheter was removed and a dressing was applied. The patient tolerated the procedure well without immediate post procedural complication. Patient received post-procedure intravenous albumin; see nursing notes for details. FINDINGS: A total of approximately 3.75 L of serous sanguinous fluid was removed. Samples were sent to the laboratory as requested by  the clinical team. IMPRESSION: Successful ultrasound-guided paracentesis yielding 3.75 liters of peritoneal fluid. Electronically Signed   By: Lavonia Dana M.D.   On:  08/04/2020 16:13   [place] ASCITES (ABDOMEN LIMITED)  Result Date: 08/18/2020 CLINICAL DATA:  Adenocarcinoma of the gastroesophageal junction, recurrent ascites EXAM: LIMITED ABDOMEN ULTRASOUND FOR ASCITES TECHNIQUE: Limited ultrasound survey for ascites was performed in all four abdominal quadrants. COMPARISON:  08/11/2020 FINDINGS: No ascites identified upon survey imaging of the abdomen. Paracentesis not performed. IMPRESSION: No ascites identified. Electronically Signed   By: Lavonia Dana M.D.   On: 08/18/2020 11:04     ASSESSMENT:  1. 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 HER-2 FISH to be followed up. PD-L1 CPS-5%. -FOLFOX and Opdivo started on 12/31/2019. -PET scan on 03/22/2020 showed marked reduction in size and metabolic activity in the distal esophageal lesion, marked reduction in size of mediastinal adenopathy. Near complete resolution with only 2 small mildly metabolic mediastinal lymph nodes remaining. Resolution of lymph nodes in the upper abdomen. Right hip bone lesion is still present but better. -CT CAP on 06/09/2020 showed interval resolution of previously demonstrated tree-in-bud nodularity in the lungs. Stable enlarged subcarinal lymph node and small residual lymph nodes with the gastrohepatic ligament and porta hepatis unchanged from the most recent PET scan. -CTAP on 08/03/2020 shows interval development of extensive omental caking particularly in the right upper quadrant with a more conglomerate soft tissue attenuation and large volume ascites. Irregular thickening in the distal esophagus incompletely assessed. Enlarging subcarinal and gastrohepatic nodes. -Paclitaxel and Cyramza started on 08/11/2020.   PLAN:  1. Stage IV GE junction adenocarcinoma: -He received the first cycle of  paclitaxel and Cyramza on 08/11/2020.  Did not experience any side effects. -Reviewed labs.  LFTs show decreased albumin 2.7.  He has resumed tube feeds.  CBC shows normal white count and platelets. -He has a procedure tomorrow at [facility]  He will come back on Friday for his day 8 of paclitaxel.  Ultrasound of the abdomen this morning did not show any ascites.  Hopefully chemotherapy is slowing down the reaccumulation. -I will see him back in 1 week prior to day 15 with labs.  2. 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  3.Weight loss: -He started back tube feeds last Wednesday.  Between boost/Ensure and Isosource, he is taking in 6 cans.  He has gained about 2 to 3 pounds.  4. Bone metastasis: -Continue monthly Zometa.  5. Diabetes: -Continue Metformin.  6. Hypertension: -Hold Lopressor if systolic blood pressure less than 110.  Today blood pressure is 94.  He received 500 mL of normal saline.  7.Anxiety: -Continue Xanax 0.5 mg as needed.  8. Ascites: -Paracentesis on 08/11/2020 with 5.8 L of fluid removed.  This probably has accounted for 9 pound weight loss since last week.  He had ultrasound done today and no ascites was found.  9.  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  He will start physical therapy soon.   Orders placed this encounter:  No orders of the defined types were placed in this encounter.    Timothy Jack, MD [place] [phone]   I, Milinda Antis, am acting as a scribe for Dr. Sanda Linger.  I, Timothy Jack MD, have reviewed the above documentation for accuracy and completeness, and I agree with the above.

## 2020-08-18 NOTE — Patient Instructions (Addendum)
[place] at [facility] Discharge Instructions  You were seen today by Dr. Delton Coombes. He went over your recent results. You can proceed with your procedure on 9/16 and receive your chemo on 9/17. Dr. Delton Coombes will see you back in 1 week for labs and follow up.   Thank you for choosing [place] at [facility] to provide your oncology and hematology care.  To afford each patient quality time with our provider, please arrive at least 15 minutes before your scheduled appointment time.   If you have a lab appointment with the [facility] please come in thru the Main Entrance and check in at the main information desk  You need to re-schedule your appointment should you arrive 10 or more minutes late.  We strive to give you quality time with our providers, and arriving late affects you and other patients whose appointments are after yours.  Also, if you no show three or more times for appointments you may be dismissed from the clinic at the providers discretion.     Again, thank you for choosing [facility].  Our hope is that these requests will decrease the amount of time that you wait before being seen by our physicians.       _____________________________________________________________  Should you have questions after your visit to [facility], please contact our office at [phone] between the hours of 8:00 a.m. and 4:30 p.m.  Voicemails left after 4:00 p.m. will not be returned until the following business day.  For prescription refill requests, have your pharmacy contact our office and allow 72 hours.    Cancer Center Support Programs:   > Cancer Support Group  2nd Tuesday of the month 1pm-2pm, Journey Room

## 2020-08-18 NOTE — Progress Notes (Signed)
Lynnae Prude tolerated portacath lab draw well without complaints or incident. Port accessed with 20 gauge needle with blood drawn for labs ordered then flushed easily per protocol.1515 Labs reviewed with Dr. Delton Coombes and pt to receive 500 ml NS over 30-45 minutes at this time for decreased B/P readings per MD orders 1555 B/P 94/63 and asymptomatic reviewed with Dr. Delton Coombes and pt ok to be discharged per MD. Pt discharged self ambulatory using his cane in satisfactory condition.

## 2020-08-19 ENCOUNTER — Ambulatory Visit (HOSPITAL_COMMUNITY): Payer: Medicare HMO

## 2020-08-19 ENCOUNTER — Other Ambulatory Visit (HOSPITAL_COMMUNITY): Payer: Medicare HMO

## 2020-08-20 ENCOUNTER — Inpatient Hospital Stay (HOSPITAL_COMMUNITY): Payer: Medicare HMO

## 2020-08-20 ENCOUNTER — Other Ambulatory Visit: Payer: Self-pay

## 2020-08-20 ENCOUNTER — Encounter (HOSPITAL_COMMUNITY): Payer: Self-pay

## 2020-08-20 VITALS — BP 104/66 | HR 75 | Temp 97.5°F | Resp 18 | Wt 187.6 lb

## 2020-08-20 DIAGNOSIS — C160 Malignant neoplasm of cardia: Secondary | ICD-10-CM

## 2020-08-20 DIAGNOSIS — Z95828 Presence of other vascular implants and grafts: Secondary | ICD-10-CM

## 2020-08-20 DIAGNOSIS — Z5111 Encounter for antineoplastic chemotherapy: Secondary | ICD-10-CM | POA: Diagnosis not present

## 2020-08-20 MED ORDER — FAMOTIDINE IN NACL 20-0.9 MG/50ML-% IV SOLN
20.0000 mg | Freq: Once | INTRAVENOUS | Status: AC
  Administered 2020-08-20: 20 mg via INTRAVENOUS
  Filled 2020-08-20: qty 50

## 2020-08-20 MED ORDER — HEPARIN SOD (PORK) LOCK FLUSH 100 UNIT/ML IV SOLN
500.0000 [IU] | Freq: Once | INTRAVENOUS | Status: AC | PRN
  Administered 2020-08-20: 500 [IU]

## 2020-08-20 MED ORDER — SODIUM CHLORIDE 0.9 % IV SOLN: Freq: Once | INTRAVENOUS | Status: AC

## 2020-08-20 MED ORDER — DIPHENHYDRAMINE HCL 50 MG/ML IJ SOLN
50.0000 mg | Freq: Once | INTRAMUSCULAR | Status: AC
  Administered 2020-08-20: 50 mg via INTRAVENOUS
  Filled 2020-08-20: qty 1

## 2020-08-20 MED ORDER — SODIUM CHLORIDE 0.9 % IV SOLN
80.0000 mg/m2 | Freq: Once | INTRAVENOUS | Status: AC
  Administered 2020-08-20: 168 mg via INTRAVENOUS
  Filled 2020-08-20: qty 28

## 2020-08-20 MED ORDER — SODIUM CHLORIDE 0.9 % IV SOLN
20.0000 mg | Freq: Once | INTRAVENOUS | Status: AC
  Administered 2020-08-20: 20 mg via INTRAVENOUS
  Filled 2020-08-20: qty 20

## 2020-08-20 NOTE — Progress Notes (Signed)
Tolerated infusion w/o adverse reaction.  Alert, in no distress.  VSS.  Discharged ambulatory in stable condition.  

## 2020-08-24 ENCOUNTER — Other Ambulatory Visit: Payer: Self-pay

## 2020-08-24 ENCOUNTER — Encounter (HOSPITAL_COMMUNITY): Payer: Self-pay | Admitting: Physical Therapy

## 2020-08-24 ENCOUNTER — Ambulatory Visit (HOSPITAL_COMMUNITY): Payer: Medicare HMO | Attending: Hematology | Admitting: Physical Therapy

## 2020-08-24 DIAGNOSIS — M25671 Stiffness of right ankle, not elsewhere classified: Secondary | ICD-10-CM | POA: Diagnosis present

## 2020-08-24 DIAGNOSIS — M6281 Muscle weakness (generalized): Secondary | ICD-10-CM | POA: Diagnosis not present

## 2020-08-24 NOTE — Therapy (Signed)
[place], [place], [address] Phone: [phone]   Fax:  [phone]  Physical Therapy Evaluation  Patient Details  Name: Timothy Oconnor MRN: [id number] Date of Birth: 20-Nov-1951 Referring Provider (PT): Donita Brooks    Encounter Date: 08/24/2020   PT End of Session - 08/24/20 0906    Visit Number 1    Number of Visits 4    Date for PT Re-Evaluation 09/14/20    Authorization Type [employer] medicare    Progress Note Due on Visit 4    PT Start Time 0830    PT Stop Time 0905    PT Time Calculation (min) 35 min    Activity Tolerance Patient tolerated treatment well    Behavior During Therapy [facility] for tasks assessed/performed           Past Medical History:  Diagnosis Date  . 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 Anxiety   . Diabetes ([place])   . Heart attack ([place])   . Heart disease   . High cholesterol   . Hyperplasia of prostate   . Hypertension   . Low back pain   . Port-A-Cath in place 12/30/2019  . Tremor     Past Surgical History:  Procedure Laterality Date  . arterectomy     1993  . BIOPSY  12/01/2019   Procedure: BIOPSY;  Surgeon: Rogene Houston, MD;  Location: [facility];  Service: Endoscopy;;  esophagus  . ESOPHAGOGASTRODUODENOSCOPY (EGD) WITH PROPOFOL N/A 12/01/2019   Procedure: ESOPHAGOGASTRODUODENOSCOPY (EGD) WITH PROPOFOL;  Surgeon: Rogene Houston, MD;  Location: [facility];  Service: Endoscopy;  Laterality: N/A;  . ESOPHAGOGASTRODUODENOSCOPY (EGD) WITH PROPOFOL N/A 12/24/2019   Procedure: ESOPHAGOGASTRODUODENOSCOPY (EGD) WITH PROPOFOL;  Surgeon: Aviva Signs, MD;  Location: [facility] ORS;  Service: General;  Laterality: N/A;  . Heart stents     2002, 2004, 2006  . PEG PLACEMENT N/A 12/24/2019   Procedure: PERCUTANEOUS ENDOSCOPIC GASTROSTOMY (PEG) PLACEMENT;  Surgeon: Aviva Signs, MD;  Location: [facility] ORS;  Service: General;  Laterality: N/A;  . PORTACATH PLACEMENT Left  12/24/2019   Procedure: INSERTION PORT-A-CATH;  Surgeon: Aviva Signs, MD;  Location: [facility] OR[facility];  Service: General;  Laterality: Left;    There were no vitals filed for this visit.    Subjective Assessment - 08/24/20 0828    Subjective Timothy Oconnor states that he has cancer with mets. He is currently having chemo.   He states that he has been having weakness in his Rt foot for about three weeks.  He started using a cane last week due to difficulty walking.    Pertinent History Per imaging mets to both lumbar and thoracic area, undergoing radiataion    Currently in Pain? Yes    Pain Score 6     Pain Location Calf    Pain Orientation Right    Pain Descriptors / Indicators Aching;Cramping    Pain Type Chronic pain    Pain Radiating Towards knee to ankle    Pain Onset More than a month ago    Pain Frequency Intermittent    Aggravating Factors  night    Pain Relieving Factors none    Effect of Pain on Daily Activities limits sleep              [facility] PT Assessment - 08/24/20 0001      Assessment   Medical Diagnosis Rt foot drop     Referring Provider (PT) Koren Shiver, Ludwig Lean  Onset Date/Surgical Date 06/23/20    Next MD Visit 08/25/2020    Prior Therapy none      Precautions   Precautions None    Required Braces or Orthoses --   requesting AFO     Restrictions   Weight Bearing Restrictions No      Balance Screen   Has the patient fallen in the past 6 months No    Has the patient had a decrease in activity level because of a fear of falling?  No    Is the patient reluctant to leave their home because of a fear of falling?  No      Home Ecologist residence      Prior Function   Level of Independence Independent      ROM / Strength   AROM / PROM / Strength Strength      Strength   Strength Assessment Site Hip;Knee;Ankle    Right/Left Hip Right;Left    Right/Left Knee Right;Left    Right/Left Ankle Right;Left    Right Ankle Dorsiflexion  2+/5    Right Ankle Plantar Flexion 4/5    Right Ankle Inversion 3/5    Right Ankle Eversion 3-/5    Left Ankle Dorsiflexion 5/5    Left Ankle Plantar Flexion 4/5    Left Ankle Inversion 4/5    Left Ankle Eversion 4/5      Balance   Balance Assessed --   Tandem stance able for 82min; single leg Lt 5"; Rt 0                     Objective measurements completed on examination: See above findings.       [place] Adult PT Treatment/Exercise - 08/24/20 0001      Exercises   Exercises Ankle      Ankle Exercises: Standing   SLS x3 B    Heel Raises Both;10 reps      Ankle Exercises: Seated   Toe Raise --   X4 then fatigued                  PT Education - 08/24/20 0906    Education Details Hep    Person(s) Educated Patient    Methods Explanation    Comprehension Verbalized understanding;Returned demonstration            PT Short Term Goals - 08/24/20 0952      PT SHORT TERM GOAL #1   Title Pt to be I in HEP to help prevent any further loss in strength, ROM of Rt ankle to allow ease of ambulation    Time 1    Period Weeks    Status New    Target Date 08/31/20             PT Long Term Goals - 08/24/20 0954      PT LONG TERM GOAL #1   Title PT to be I in advance HEP to keep ROM and strength of RT ankle at optimal level for best gait .    Time 2    Period Weeks    Status New    Target Date 09/07/20      PT LONG TERM GOAL #2   Title PT to have referral for othotist for AFO on Rt LE    Time 2    Period Weeks    Status New      PT LONG TERM GOAL #3   Title  PT to be able to single leg stance on both LE for 10 seconds to decrease risk of falling    Time 2    Period Weeks    Status New                  Plan - 08/24/20 0908    Clinical Impression Statement Timothy Oconnor is a 69 yo male who has been referred to skilled PT for foot drop.  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  He is experiencing peripheral neuropathy.  At this time evaluation demonstrates decreased ankle strength as well as decreased balance causing instability in Timothy Oconnor gait.  He will benefit from an orthotic referral for an AFO as well as LE exercisese to at least attempt to offset any further weakening of his ankle mm to avoid any falls.    Personal Factors and Comorbidities Comorbidity 1;Time since onset of injury/illness/exacerbation    Comorbidities Cancer with mets,    Examination-Activity Limitations Carry;Lift;Locomotion Level    Examination-Participation Restrictions Community Activity;Yard Work    Biomedical scientist Low    Rehab Potential Fair    PT Frequency 2x / week    PT Duration 2 weeks    PT Treatment/Interventions ADLs/Self Care Home Management;Therapeutic activities;Therapeutic exercise;Balance training;Orthotic Fit/Training;Patient/family education    PT Next Visit Plan begin and give gastroc stretch,  isometric exercises , tandem stance with head turns for HEP progress to Tband    PT Home Exercise Plan given sitting dorsi/plantarflexion, standing heel raises, standing single leg stance at counter with wall on one side chair on the other.           Patient will benefit from skilled therapeutic intervention in order to improve the following deficits and impairments:  Abnormal gait, Decreased balance, Decreased strength  Visit Diagnosis: Muscle weakness (generalized) - Plan: PT plan of care cert/re-cert  Stiffness of right ankle, not elsewhere classified - Plan: PT plan of care cert/re-cert     Problem List Patient Active Problem List   Diagnosis Date Noted  . Abdominal distension 08/03/2020  . PEG tube malfunction ([place]) 08/03/2020  . Bone metastases ([place]) 03/24/2020  . PEG (percutaneous endoscopic gastrostomy) status ([place]) 01/08/2020  . Port-A-Cath in place 12/30/2019  .  Goals of care, counseling/discussion 12/29/2019  . 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 Esophageal cancer ([place])   . Dysphagia 11/30/2019  . HTN (hypertension) 11/30/2019  . Type 2 diabetes mellitus without complication ([place]) 03/50/0938  . HLD (hyperlipidemia) 11/30/2019  . BPH (benign prostatic hyperplasia) 11/30/2019  . Tremor 12/08/2015   Rayetta Humphrey, PT CLT [phone] 08/24/2020, 10:00 AM  [place], [place], [address] Phone: [phone]   Fax:  [phone]  Name: Timothy Oconnor MRN: [id number] Date of Birth: [birth year]

## 2020-08-25 ENCOUNTER — Inpatient Hospital Stay (HOSPITAL_COMMUNITY): Payer: Medicare HMO

## 2020-08-25 ENCOUNTER — Inpatient Hospital Stay (HOSPITAL_BASED_OUTPATIENT_CLINIC_OR_DEPARTMENT_OTHER): Payer: Medicare HMO | Admitting: Hematology

## 2020-08-25 ENCOUNTER — Other Ambulatory Visit (HOSPITAL_COMMUNITY): Payer: Medicare HMO

## 2020-08-25 VITALS — BP 103/69 | HR 78 | Temp 97.1°F | Resp 18

## 2020-08-25 VITALS — BP 109/70 | HR 95 | Temp 97.1°F | Resp 18 | Wt 185.1 lb

## 2020-08-25 DIAGNOSIS — C160 Malignant neoplasm of cardia: Secondary | ICD-10-CM | POA: Diagnosis not present

## 2020-08-25 DIAGNOSIS — C7951 Secondary malignant neoplasm of bone: Secondary | ICD-10-CM

## 2020-08-25 DIAGNOSIS — Z95828 Presence of other vascular implants and grafts: Secondary | ICD-10-CM

## 2020-08-25 DIAGNOSIS — Z5111 Encounter for antineoplastic chemotherapy: Secondary | ICD-10-CM | POA: Diagnosis not present

## 2020-08-25 LAB — CBC WITH DIFFERENTIAL/PLATELET
Abs Immature Granulocytes: 0.01 10*3/uL (ref 0.00–0.07)
Basophils Absolute: 0 10*3/uL (ref 0.0–0.1)
Basophils Relative: 1 %
Eosinophils Absolute: 0 10*3/uL (ref 0.0–0.5)
Eosinophils Relative: 1 %
HCT: 36.5 % — ABNORMAL LOW (ref 39.0–52.0)
Hemoglobin: 11.8 g/dL — ABNORMAL LOW (ref 13.0–17.0)
Immature Granulocytes: 0 %
Lymphocytes Relative: 35 %
Lymphs Abs: 1.3 10*3/uL (ref 0.7–4.0)
MCH: 30.2 pg (ref 26.0–34.0)
MCHC: 32.3 g/dL (ref 30.0–36.0)
MCV: 93.4 fL (ref 80.0–100.0)
Monocytes Absolute: 0.2 10*3/uL (ref 0.1–1.0)
Monocytes Relative: 6 %
Neutro Abs: 2.3 10*3/uL (ref 1.7–7.7)
Neutrophils Relative %: 57 %
Platelets: 390 10*3/uL (ref 150–400)
RBC: 3.91 MIL/uL — ABNORMAL LOW (ref 4.22–5.81)
RDW: 15.2 % (ref 11.5–15.5)
WBC: 3.9 10*3/uL — ABNORMAL LOW (ref 4.0–10.5)
nRBC: 0 % (ref 0.0–0.2)

## 2020-08-25 LAB — COMPREHENSIVE METABOLIC PANEL
ALT: 39 U/L (ref 0–44)
AST: 32 U/L (ref 15–41)
Albumin: 2.9 g/dL — ABNORMAL LOW (ref 3.5–5.0)
Alkaline Phosphatase: 56 U/L (ref 38–126)
Anion gap: 11 (ref 5–15)
BUN: 12 mg/dL (ref 8–23)
CO2: 24 mmol/L (ref 22–32)
Calcium: 9.1 mg/dL (ref 8.9–10.3)
Chloride: 101 mmol/L (ref 98–111)
Creatinine, Ser: 0.73 mg/dL (ref 0.61–1.24)
GFR calc Af Amer: >60 mL/min (ref >60)
GFR calc non Af Amer: >60 mL/min (ref >60)
Glucose, Bld: 221 mg/dL — ABNORMAL HIGH (ref 70–99)
Potassium: 3.9 mmol/L (ref 3.5–5.1)
Sodium: 136 mmol/L (ref 135–145)
Total Bilirubin: 0.5 mg/dL (ref 0.3–1.2)
Total Protein: 6.5 g/dL (ref 6.5–8.1)

## 2020-08-25 LAB — URINALYSIS, DIPSTICK ONLY
Bilirubin Urine: NEGATIVE
Glucose, UA: NEGATIVE mg/dL
Hgb urine dipstick: NEGATIVE
Ketones, ur: NEGATIVE mg/dL
Leukocytes,Ua: NEGATIVE
Nitrite: NEGATIVE
Protein, ur: NEGATIVE mg/dL
Specific Gravity, Urine: 1.01 (ref 1.005–1.030)
pH: 5 (ref 5.0–8.0)

## 2020-08-25 MED ORDER — ZOLEDRONIC ACID 4 MG/100ML IV SOLN
4.0000 mg | Freq: Once | INTRAVENOUS | Status: AC
  Administered 2020-08-25: 4 mg via INTRAVENOUS

## 2020-08-25 MED ORDER — FAMOTIDINE IN NACL 20-0.9 MG/50ML-% IV SOLN
20.0000 mg | Freq: Once | INTRAVENOUS | Status: AC
  Administered 2020-08-25: 20 mg via INTRAVENOUS

## 2020-08-25 MED ORDER — SODIUM CHLORIDE 0.9 % IV SOLN: Freq: Once | INTRAVENOUS | Status: AC

## 2020-08-25 MED ORDER — HEPARIN SOD (PORK) LOCK FLUSH 100 UNIT/ML IV SOLN
500.0000 [IU] | Freq: Once | INTRAVENOUS | Status: AC | PRN
  Administered 2020-08-25: 500 [IU]

## 2020-08-25 MED ORDER — FAMOTIDINE IN NACL 20-0.9 MG/50ML-% IV SOLN
INTRAVENOUS | Status: AC
  Filled 2020-08-25: qty 50

## 2020-08-25 MED ORDER — DIPHENHYDRAMINE HCL 50 MG/ML IJ SOLN
50.0000 mg | Freq: Once | INTRAMUSCULAR | Status: AC
  Administered 2020-08-25: 50 mg via INTRAVENOUS

## 2020-08-25 MED ORDER — SODIUM CHLORIDE 0.9 % IV SOLN
8.0000 mg/kg | Freq: Once | INTRAVENOUS | Status: AC
  Administered 2020-08-25: 700 mg via INTRAVENOUS
  Filled 2020-08-25: qty 20

## 2020-08-25 MED ORDER — SODIUM CHLORIDE 0.9% FLUSH: 10.0000 mL | INTRAVENOUS | Status: DC | PRN

## 2020-08-25 MED ORDER — SODIUM CHLORIDE 0.9 % IV SOLN
20.0000 mg | Freq: Once | INTRAVENOUS | Status: AC
  Administered 2020-08-25: 20 mg via INTRAVENOUS
  Filled 2020-08-25: qty 20

## 2020-08-25 MED ORDER — PEGFILGRASTIM-JMDB 6 MG/0.6ML ~~LOC~~ SOSY
PREFILLED_SYRINGE | SUBCUTANEOUS | Status: AC
  Filled 2020-08-25: qty 0.6

## 2020-08-25 MED ORDER — ZOLEDRONIC ACID 4 MG/100ML IV SOLN
INTRAVENOUS | Status: AC
  Filled 2020-08-25: qty 100

## 2020-08-25 MED ORDER — DIPHENHYDRAMINE HCL 50 MG/ML IJ SOLN
INTRAMUSCULAR | Status: AC
  Filled 2020-08-25: qty 1

## 2020-08-25 MED ORDER — SODIUM CHLORIDE 0.9 % IV SOLN
80.0000 mg/m2 | Freq: Once | INTRAVENOUS | Status: AC
  Administered 2020-08-25: 168 mg via INTRAVENOUS
  Filled 2020-08-25: qty 28

## 2020-08-25 NOTE — Progress Notes (Signed)
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  PRIOR THERAPY: FOLFOX and nivolumab x 14 cycles from 12/31/2019 to 07/26/2020  NGS Results: PD-L1 CPS 5%, Foundation 1 MS--stable, TMB 8 Muts/Mb  CURRENT THERAPY: Paclitaxel 3 weeks on, 1 week off; ramucirumab  BRIEF ONCOLOGIC HISTORY:  Oncology History  Esophageal cancer ([place])   Initial Diagnosis   Esophageal cancer ([place])    Genetic Testing   Foundation One and PDL-1 Results:       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[place])  12/24/2019 Initial Diagnosis   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[place])   12/29/2019 Cancer Staging   Staging form: Esophagus - Adenocarcinoma, AJCC 8th Edition - Clinical stage from 12/29/2019: Stage IVB (cTX, cN3, cM1) - Signed by Derek Jack, MD on 12/29/2019   12/31/2019 - 07/28/2020 Chemotherapy   The patient had palonosetron (ALOXI) injection 0.25 mg, 0.25 mg, Intravenous,  Once, 14 of 16 cycles Administration: 0.25 mg (12/31/2019), 0.25 mg (01/14/2020), 0.25 mg (01/28/2020), 0.25 mg (02/11/2020), 0.25 mg (02/25/2020), 0.25 mg (03/10/2020), 0.25 mg (03/24/2020), 0.25 mg (04/12/2020), 0.25 mg (04/26/2020), 0.25 mg (05/10/2020), 0.25 mg (05/24/2020), 0.25 mg (06/28/2020), 0.25 mg (07/12/2020), 0.25 mg (07/26/2020) leucovorin 868 mg in dextrose 5 % 250 mL infusion, 400 mg/m2 = 868 mg, Intravenous,  Once, 14 of 16 cycles Administration: 868 mg (12/31/2019), 868 mg (01/14/2020), 868 mg (01/28/2020), 868 mg (02/11/2020), 868 mg (02/25/2020), 868 mg (03/10/2020), 868 mg (03/24/2020), 868 mg (04/12/2020), 868 mg (04/26/2020), 868 mg (05/10/2020), 868 mg (05/24/2020), 868 mg (06/28/2020), 868 mg (07/12/2020), 868 mg (07/26/2020) oxaliplatin (ELOXATIN) 150 mg in dextrose 5 % 500 mL chemo infusion, 68 mg/m2 = 150 mg (80 % of  original dose 85 mg/m2), Intravenous,  Once, 13 of 15 cycles Dose modification: 68 mg/m2 (80 % of original dose 85 mg/m2, Cycle 1, Reason: Other (see comments), Comment: diabetic neuropathy) Administration: 150 mg (12/31/2019), 150 mg (01/14/2020), 150 mg (01/28/2020), 150 mg (02/11/2020), 150 mg (02/25/2020), 150 mg (03/10/2020), 150 mg (03/24/2020), 150 mg (04/12/2020), 150 mg (04/26/2020), 150 mg (05/10/2020), 150 mg (05/24/2020), 150 mg (06/28/2020), 150 mg (07/12/2020) fluorouracil (ADRUCIL) chemo injection 850 mg, 400 mg/m2 = 850 mg, Intravenous,  Once, 14 of 16 cycles Administration: 850 mg (12/31/2019), 850 mg (01/14/2020), 850 mg (01/28/2020), 850 mg (02/11/2020), 850 mg (02/25/2020), 850 mg (03/10/2020), 850 mg (03/24/2020), 850 mg (04/12/2020), 850 mg (04/26/2020), 850 mg (05/10/2020), 850 mg (05/24/2020), 850 mg (06/28/2020), 850 mg (07/26/2020) fluorouracil (ADRUCIL) 5,000 mg in sodium chloride 0.9 % 150 mL chemo infusion, 2,310 mg/m2 = 5,200 mg, Intravenous, 1 Day/Dose, 14 of 16 cycles Administration: 5,000 mg (12/31/2019), 5,000 mg (01/14/2020), 5,000 mg (01/28/2020), 5,000 mg (02/11/2020), 5,000 mg (02/25/2020), 5,000 mg (03/10/2020), 5,000 mg (03/24/2020), 5,000 mg (04/12/2020), 5,000 mg (04/26/2020), 5,000 mg (05/10/2020), 5,000 mg (05/24/2020), 5,200 mg (06/28/2020), 5,200 mg (07/26/2020) nivolumab (OPDIVO) 240 mg in sodium chloride 0.9 % 100 mL chemo infusion, 240 mg (100 % of original dose 240 mg), Intravenous, Once, 14 of 16 cycles Dose modification: 240 mg (original dose 240 mg, Cycle 1, Reason: Provider Judgment) Administration: 240 mg (12/31/2019), 240 mg (01/14/2020), 240 mg (01/28/2020), 240 mg (02/11/2020), 240 mg (02/25/2020), 240 mg (03/10/2020), 240 mg (03/24/2020), 240 mg (04/12/2020), 240 mg (04/26/2020), 240 mg (05/10/2020), 240 mg (05/24/2020), 240 mg (06/28/2020), 240 mg (07/12/2020), 240 mg (  07/26/2020)  for chemotherapy treatment.     [place] One and PDL-1 Results:       08/11/2020 -  Chemotherapy    The patient had PACLitaxel (TAXOL) 168 mg in sodium chloride 0.9 % 250 mL chemo infusion (</= 44m/m2), 80 mg/m2 = 168 mg, Intravenous,  Once, 1 of 6 cycles Administration: 168 mg (08/11/2020), 168 mg (08/20/2020) ramucirumab (CYRAMZA) 700 mg in sodium chloride 0.9 % 180 mL chemo infusion, 8 mg/kg = 700 mg, Intravenous, Once, 1 of 6 cycles Administration: 700 mg (08/11/2020)  for chemotherapy treatment.      CANCER STAGING: Cancer Staging 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[facility] Staging form: Esophagus - Adenocarcinoma, AJCC 8th Edition - Clinical stage from 12/29/2019: Stage IVB (cTX, cN3, cM1) - Signed by KDerek Jack MD on 12/29/2019   INTERVAL HISTORY:  Timothy Oconnor a 69y.o. male, returns for routine follow-up and consideration for next cycle of chemotherapy. Timothy Oconnor last seen on 08/18/2020.  Due for day #15 of cycle #1 of paclitaxel and ramucirumab today.   Overall, Timothy Oconnor tells me Timothy Oconnor has been feeling okay. Timothy Oconnor [facility] SCarol Adaand tolerated it well; Timothy Oconnor still has some trouble swallowing and [facility] Timothy Oconnor reports having constipation over the weekend, then overdid it and had diarrhea for several days. Timothy Oconnor had an evaluation with PT and was told that the foot drop is unlikely to resolve and will refer him to an orthopedic surgeon to have a brace ordered. His belly feels a bit tight today, especially after drinking 6 cans of Osmolyte, and Timothy Oconnor only eats Jello by mouth without vomiting it back up. Timothy Oconnor tolerated the previous treatment well without nosebleeds, hematochezia or hematuria. The numbness in his fingertips and feet is stable; the numbness has affected his balance and Timothy Oconnor is not able to stand on one foot on his right foot.  Overall, Timothy Oconnor feels ready for next cycle of chemo today.    REVIEW OF SYSTEMS:  Review of Systems  Constitutional: Positive for fatigue (severe).  HENT:   Positive for  trouble swallowing. Negative for nosebleeds.   Respiratory: Positive for cough.   Gastrointestinal: Positive for abdominal distention and diarrhea. Negative for blood in stool.  Genitourinary: Negative for hematuria.   Neurological: Positive for extremity weakness (R foot drop) and numbness (fingertips & feet).  Psychiatric/Behavioral: Positive for sleep disturbance.    PAST MEDICAL/SURGICAL HISTORY:  Past Medical History:  Diagnosis Date  . 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[place]   . Anxiety   . Diabetes ([place]   . Heart attack ([facility] [place]   . Heart disease   . High cholesterol   . Hyperplasia of prostate   . Hypertension   . Low back pain   . Port-A-Cath in place 12/30/2019  . Tremor    Past Surgical History:  Procedure Laterality Date  . arterectomy     1993  . BIOPSY  12/01/2019   Procedure: BIOPSY;  Surgeon: RRogene Houston MD;  Location: [facility];  Service: Endoscopy;;  esophagus  . ESOPHAGOGASTRODUODENOSCOPY (EGD) WITH PROPOFOL N/A 12/01/2019   Procedure: ESOPHAGOGASTRODUODENOSCOPY (EGD) WITH PROPOFOL;  Surgeon: RRogene [place] MD;  Location: [facility] END[facility];  Service: Endoscopy;  Laterality: N/A;  . ESOPHAGOGASTRODUODENOSCOPY (EGD) WITH PROPOFOL N/A 12/24/2019   Procedure: ESOPHAGOGASTRODUODENOSCOPY (EGD) WITH PROPOFOL;  Surgeon: JAviva Signs MD;  Location: [facility] ORS;  Service: General;  Laterality: N/A;  .  Heart stents     2002, 2004, 2006  . PEG PLACEMENT N/A 12/24/2019   Procedure: PERCUTANEOUS ENDOSCOPIC GASTROSTOMY (PEG) PLACEMENT;  Surgeon: Aviva Signs, MD;  Location: [facility] ORS;  Service: General;  Laterality: N/A;  . PORTACATH PLACEMENT Left 12/24/2019   Procedure: INSERTION PORT-A-CATH;  Surgeon: Aviva Signs, MD;  Location: [facility] ORS;  Service: General;  Laterality: Left;    SOCIAL HISTORY:  Social History   Socioeconomic History  . Marital status: Married    Spouse name: Not on file  . Number of children: 2  . Years of education: 86  . Highest  education level: Not on file  Occupational History  . Occupation: [employer]    Comment: Delivers medication  Tobacco Use  . Smoking status: Former Smoker    Packs/day: 1.00    Years: 50.00    Pack years: 50.00    Quit date: 12/21/2017    Years since quitting: 2.6  . Smokeless tobacco: Never Used  . Tobacco comment: Quit 2017  Vaping Use  . Vaping Use: Never used  Substance and Sexual Activity  . Alcohol use: Yes    Alcohol/week: 0.0 standard drinks    Comment: 3 drinks per week  . Drug use: No  . Sexual activity: Yes  Other Topics Concern  . Not on file  Social History Narrative   Lives at home with his wife.   Right-handed.   2 diet cokes per day.   Social Determinants of Health   Financial Resource Strain: Low Risk   . Difficulty of Paying Living Expenses: Not hard at all  Food Insecurity: No Food Insecurity  . Worried About Charity fundraiser in the Last Year: Never true  . Ran Out of Food in the Last Year: Never true  Transportation Needs: No Transportation Needs  . Lack of Transportation (Medical): No  . Lack of Transportation (Non-Medical): No  Physical Activity: Insufficiently Active  . Days of Exercise per Week: 2 days  . Minutes of Exercise per Session: 30 min  Stress: No Stress Concern Present  . Feeling of Stress : Only a little  Social Connections: Socially Integrated  . Frequency of Communication with Friends and Family: More than three times a week  . Frequency of Social Gatherings with Friends and Family: More than three times a week  . Attends Religious Services: 1 to 4 times per year  . Active Member of Clubs or Organizations: No  . Attends Archivist Meetings: 1 to 4 times per year  . Marital Status: Married  Human resources officer Violence: Not At Risk  . Fear of Current or Ex-Partner: No  . Emotionally Abused: No  . Physically Abused: No  . Sexually Abused: No    FAMILY HISTORY:  Family History  Problem Relation Age of Onset  .  Alzheimer's disease Mother   . Diabetes Father   . Heart disease Father   . Stroke Father   . Heart disease Brother   . Colon cancer Brother   . Alzheimer's disease Sister     CURRENT MEDICATIONS:  Current Outpatient Medications  Medication Sig Dispense Refill  . ALPRAZolam (XANAX) 0.5 MG tablet Take 1 tablet (0.5 mg total) by mouth 2 (two) times daily as needed for anxiety. 30 tablet 0  . aspirin EC 81 MG tablet Take 81 mg by mouth daily.     Marland Kitchen ezetimibe (ZETIA) 10 MG tablet Take 10 mg by mouth daily.     . folic acid (FOLVITE)  800 MCG tablet Take 800 mcg by mouth daily.     . furosemide (LASIX) 20 MG tablet Take 1 tablet (20 mg total) by mouth daily. Do not take if the upper number of your BP is less than 110. 30 tablet 2  . isosorbide mononitrate (IMDUR) 60 MG 24 hr tablet Take 60 mg by mouth daily.    Marland Kitchen losartan (COZAAR) 50 MG tablet Take 25 mg by mouth daily.     . Misc. Devices MISC Please provide osmolite 1.5.  2 cartons four times daily via feeding tube (8 daily total).  Flush with 73m water before and after administration of osmolite. 1 each 0  . Oxycodone HCl 10 MG TABS Take 1 tablet (10 mg total) by mouth at bedtime as needed. 30 tablet 0  . PACLitaxel (TAXOL IV) Inject into the vein.    . Ramucirumab (CYRAMZA IV) Inject into the vein.    . tamsulosin (FLOMAX) 0.4 MG CAPS capsule Take 0.4 mg by mouth daily.     .Marland Kitchenatorvastatin (LIPITOR) 80 MG tablet Take 80 mg by mouth every evening.  (Patient not taking: Reported on 08/25/2020)    . metFORMIN (GLUCOPHAGE-XR) 500 MG 24 hr tablet Take 500 mg by mouth daily.  (Patient not taking: Reported on 08/25/2020)    . metoprolol tartrate (LOPRESSOR) 25 MG tablet Take 0.5 tablets (12.5 mg total) by mouth 2 (two) times daily. (Patient not taking: Reported on 08/25/2020) 60 tablet 1  . Multiple Vitamin (MULTI-VITAMINS) TABS Take 1 tablet by mouth daily.  (Patient not taking: Reported on 08/25/2020)     No current facility-administered  medications for this visit.    ALLERGIES:  No Known Allergies  PHYSICAL EXAM:  Performance status (ECOG): 1 - Symptomatic but completely ambulatory  Vitals:   08/25/20 0756  BP: 109/70  Pulse: 95  Resp: 18  Temp: (!) 97.1 F (36.2 C)  SpO2: 99%   Wt Readings from Last 3 Encounters:  08/25/20 185 lb 1.6 oz (84 kg)  08/20/20 187 lb 9.8 oz (85.1 kg)  08/18/20 184 lb 15.5 oz (83.9 kg)   Physical Exam Vitals reviewed.  Constitutional:      Appearance: Normal appearance.  Cardiovascular:     Rate and Rhythm: Normal rate and regular rhythm.     Pulses: Normal pulses.     Heart sounds: Normal heart sounds.  Pulmonary:     Effort: Pulmonary effort is normal.     Breath sounds: Normal breath sounds.  Chest:     Comments: Port-a-Cath in R chest Abdominal:     General: There is distension.     Palpations: Abdomen is soft.     Tenderness: There is no abdominal tenderness.     Comments: PEG tube  Musculoskeletal:     Right lower leg: No edema.     Left lower leg: No edema.  Neurological:     General: No focal deficit present.     Mental Status: Timothy Oconnor is alert and oriented to person, place, and time.  Psychiatric:        Mood and Affect: Mood normal.        Behavior: Behavior normal.     LABORATORY DATA:  I have reviewed the labs as listed.  CBC Latest Ref Rng & Units 08/25/2020 08/18/2020 08/11/2020  WBC 4.0 - 10.5 K/uL 3.9(L) 4.7 6.9  Hemoglobin 13.0 - 17.0 g/dL 11.8(L) 10.8(L) 11.9(L)  Hematocrit 39 - 52 % 36.5(L) 33.2(L) 36.3(L)  Platelets 150 - 400 K/uL 390  402(H) 436(H)   CMP Latest Ref Rng & Units 08/25/2020 08/18/2020 08/11/2020  Glucose 70 - 99 mg/dL 221(H) 192(H) 189(H)  BUN 8 - 23 mg/dL _0 Creatinine 0.61 - 1.24 mg/dL 0.73 0.66 0.99  Sodium 135 - 145 mmol/L 136 133(L) 134(L)  Potassium 3.5 - 5.1 mmol/L 3.9 4.1 4.3  Chloride 98 - 111 mmol/L 101 99 95(L)  CO2 22 - 32 mmol/L _1 Calcium 8.9 - 10.3 mg/dL 9.1 8.7(L) 9.4  Total Protein 6.5 - 8.1 g/dL 6.5  6.7 6.8  Total Bilirubin 0.3 - 1.2 mg/dL 0.5 0.2(L) 0.8  Alkaline Phos 38 - 126 U/L 56 55 48  AST 15 - 41 U/L 32 33 23  ALT 0 - 44 U/L 39 40 20    DIAGNOSTIC IMAGING:  I have independently reviewed the scans and discussed with the patient. CT ABDOMEN PELVIS W CONTRAST  Result Date: 08/03/2020 CLINICAL DATA:  Abdominal distension, reflux from feeding tube, history of gastroesophageal cancer, ongoing chemotherapy contrast injected into feeding tube EXAM: CT ABDOMEN AND PELVIS WITH CONTRAST TECHNIQUE: Multidetector CT imaging of the abdomen and pelvis was performed using the standard protocol following bolus administration of intravenous contrast. CONTRAST:  159m OMNIPAQUE IOHEXOL 300 MG/ML  SOLN COMPARISON:  CT chest, abdomen and pelvis 06/09/2020, PET-CT 03/22/2020 FINDINGS: Lower chest: There are bilateral pleural effusions. Bandlike areas of opacity likely reflecting subsegmental atelectasis and/or scarring. Cardiac size within normal limits. Coronary artery calcifications are present. Small pericardial effusion. Hepatobiliary: Insert normal liver layering attenuation within the gallbladder, could reflect some biliary sludge. No visible calcified gallstones or frank gallbladder wall thickening. No biliary ductal dilatation. Pancreas: Unremarkable. No pancreatic ductal dilatation or surrounding inflammatory changes. Spleen: Normal in size. No concerning splenic lesions. Adrenals/Urinary Tract: Normal adrenal glands. Kidneys enhance and excrete uniformly and symmetrically. 3 cm fluid attenuation cyst seen in the upper pole left kidney. Additional subcentimeter hypodense foci in the left kidney too small to fully characterize on CT imaging but statistically likely benign. No concerning renal masses. No urolithiasis or hydronephrosis. No urolithiasis or hydronephrosis. Indentation of bladder base by an enlarged, nodular prostate. Mild circumferential bladder wall thickening, nonspecific. Stomach/Bowel:  Irregular thickening in the distal thoracic esophagus, incompletely assessed given underdistention and lack of luminal contrast media. No high attenuation contrast material is seen refluxing into the esophagus though the dilute appearance of the contrast may limit sensitivity. Percutaneous gastrostomy tube is in place. Inflated balloon within the gastric lumen at the antrum. Mild circumferential thickening towards the gastric antrum/pylorus likely related to peristalsis/normal contraction. Duodenum is unremarkable. Mild diffuse thickening of the small bowel, nonspecific given the presence of extensive large volume ascites throughout the abdomen and pelvis. No dilatation A normal appendix is visualized. No colonic dilatation or wall thickening. Scattered colonic diverticula without focal inflammation to suggest diverticulitis. Vascular/Lymphatic: Atherosclerotic calcifications within the abdominal aorta and branch vessels. No aneurysm or ectasia. No enlarged abdominopelvic lymph nodes. Enlarging subcarinal node now measuring up to 15 mm, previously 13 mm. Enlarging gastrohepatic nodes are noted as well, largest measuring up to 16 mm, not seen on prior examinations. Reproductive: Prostatomegaly. Heterogeneous nodularity of the prostate is nonspecific. Other: Interval development of extensive omental caking particularly in the right upper quadrant with a more conglomerate soft tissue attenuation measuring up to 4.2 x 9.6 x 5.4 cm in maximal AP by transverse by craniocaudal dimensions. Adherent loops of bowel without evidence of resulting obstruction. Interval development of a large volume ascites of low  to intermediate attenuation measuring up to 20 HU. No abdominopelvic free air. No bowel containing hernias. Fat containing umbilical hernia containing some omental nodularity as well (2/63). Diffuse mild body wall edema including more focal skin thickening about the umbilicus and inferior abdominal panniculus.  Musculoskeletal: No acute osseous abnormality or suspicious osseous lesion. Multilevel degenerative changes are present in the imaged portions of the spine. Additional moderate degenerative changes in the bilateral hips and pelvis. Enthesopathic changes noted about the pelvis and proximal femora as well. IMPRESSION: 1. Interval development of extensive omental caking particularly in the right upper quadrant with a more conglomerate soft tissue attenuation and within a fat containing umbilical hernia. 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 2. No high attenuation contrast material is seen refluxing into the esophagus though the dilute appearance of the contrast may limit sensitivity.Percutaneous gastrostomy tube is in place. Inflated balloon within the gastric lumen at the antrum. 3. Irregular thickening in the distal thoracic esophagus, incompletely assessed given underdistention and lack of luminal contrast media. 4. Enlarging subcarinal and gastrohepatic nodes, concerning for progression of nodal metastatic disease. 5. Bilateral pleural effusions, small pericardial effusion, and diffuse mild body wall edema. 6. Mild circumferential bladder wall thickening, nonspecific. Possibly related to chronic outlet obstruction with a heterogeneous, enlarged prostate though should correlate with urinalysis to exclude cystitis. 7. Heterogeneity of the prostate gland is nonspecific. Correlate with outpatient prostate exam and PSA as clinically warranted. 8. Aortic Atherosclerosis (ICD10-I70.0). Electronically Signed   By: Lovena Le M.D.   On: 08/03/2020 18:44   US Paracentesis  Result Date: 08/11/2020 INDICATION: Malignant ascites, adenocarcinoma of the gastroesophageal junction EXAM: ULTRASOUND GUIDED THERAPEUTIC PARACENTESIS MEDICATIONS: None COMPLICATIONS: None immediate PROCEDURE: Informed written consent was obtained from the patient after a discussion of the risks, benefits and alternatives  to treatment. A timeout was performed prior to the initiation of the procedure. Initial ultrasound scanning demonstrates a large amount of ascites within the right lower abdominal quadrant. The right lower abdomen was prepped and draped in the usual sterile fashion. 1% lidocaine was used for local anesthesia. Following this, a 5 [place] Yueh catheter was introduced. An ultrasound image was saved for documentation purposes. The paracentesis was performed. The catheter was removed and a dressing was applied. The patient tolerated the procedure well without immediate post procedural complication. Patient received post-procedure intravenous albumin; see nursing notes for details. FINDINGS: A total of approximately 5.8 L of yellow fluid was removed. IMPRESSION: Successful ultrasound-guided paracentesis yielding 5.8 liters of peritoneal fluid. Electronically Signed   By: Lavonia Dana M.D.   On: 08/11/2020 12:52   US Paracentesis  Result Date: 08/04/2020 INDICATION: Ascites, cancer of the gastroesophageal junction EXAM: ULTRASOUND GUIDED DIAGNOSTIC AND THERAPEUTIC PARACENTESIS MEDICATIONS: None COMPLICATIONS: None immediate PROCEDURE: Informed written consent was obtained from the patient after a discussion of the risks, benefits and alternatives to treatment. A timeout was performed prior to the initiation of the procedure. Initial ultrasound scanning demonstrates a large amount of ascites within the right lower abdominal quadrant. The right lower abdomen was prepped and draped in the usual sterile fashion. 1% lidocaine was used for local anesthesia. Following this, a 5 [place] Yueh catheter was introduced. An ultrasound image was saved for documentation purposes. The paracentesis was performed. The catheter was removed and a dressing was applied. The patient tolerated the procedure well without immediate post procedural complication. Patient received post-procedure intravenous albumin; see nursing notes for details.  FINDINGS: A total of approximately 3.75 L of serous  sanguinous fluid was removed. Samples were sent to the laboratory as requested by the clinical team. IMPRESSION: Successful ultrasound-guided paracentesis yielding 3.75 liters of peritoneal fluid. Electronically Signed   By: Lavonia Dana M.D.   On: 08/04/2020 16:13   [place] ASCITES (ABDOMEN LIMITED)  Result Date: 08/18/2020 CLINICAL DATA:  Adenocarcinoma of the gastroesophageal junction, recurrent ascites EXAM: LIMITED ABDOMEN ULTRASOUND FOR ASCITES TECHNIQUE: Limited ultrasound survey for ascites was performed in all four abdominal quadrants. COMPARISON:  08/11/2020 FINDINGS: No ascites identified upon survey imaging of the abdomen. Paracentesis not performed. IMPRESSION: No ascites identified. Electronically Signed   By: Lavonia Dana M.D.   On: 08/18/2020 11:04     ASSESSMENT:  1. 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 HER-2 FISH to be followed up. PD-L1 CPS-5%. -FOLFOX and Opdivo started on 12/31/2019. -PET scan on 03/22/2020 showed marked reduction in size and metabolic activity in the distal esophageal lesion, marked reduction in size of mediastinal adenopathy. Near complete resolution with only 2 small mildly metabolic mediastinal lymph nodes remaining. Resolution of lymph nodes in the upper abdomen. Right hip bone lesion is still present but better. -CT CAP on 06/09/2020 showed interval resolution of previously demonstrated tree-in-bud nodularity in the lungs. Stable enlarged subcarinal lymph node and small residual lymph nodes with the gastrohepatic ligament and porta hepatis unchanged from the most recent PET scan. -CTAP on 08/03/2020 shows interval development of extensive omental caking particularly in the right upper quadrant with a more conglomerate soft tissue attenuation and large volume ascites. Irregular thickening in the distal esophagus incompletely assessed. Enlarging subcarinal and gastrohepatic  nodes. -Paclitaxel and Cyramza started on 08/11/2020.   PLAN:  1. Stage IV GE junction adenocarcinoma: -Timothy Oconnor has tolerated paclitaxel very well.  No abdominal distention was noted. -Labs today shows albumin 2.9 but normal LFTs.  White count is 3.9 with 57% neutrophils. -Timothy Oconnor will proceed with cycle 1 day 15 of paclitaxel and Cyramza. -I plan to see him back in 2 weeks for follow-up prior to start of cycle 2.  2. Hoarseness of voice: -Timothy Oconnor had left medialization laryngoplasty using Silastic implant done on 08/19/2020.  His voice has improved.  3.Weight loss: -His weight is stable. -Continue 6 cans of Osmolite.  4. Bone metastasis: -Continue monthly Zometa.  5. Diabetes: -Continue Metformin.  6. Hypertension: -Blood pressure today is 109/70.  Continue to hold Lopressor.  7.Anxiety: -Continue Xanax as needed.  8. Ascites: -Paracentesis on 08/11/2020 with 5.8 L removed. -No reaccumulation at this time.  9. Right foot drop: -Timothy Oconnor was seen by physical therapy and was offered some exercises. -We will order a brace.   Orders placed this encounter:  No orders of the defined types were placed in this encounter.    Derek Jack, MD [place] [phone]   I, Milinda Antis, am acting as a scribe for Dr. Sanda Linger.  I, Derek Jack MD, have reviewed the above documentation for accuracy and completeness, and I agree with the above.

## 2020-08-25 NOTE — Patient Instructions (Signed)
[facility] Discharge Instructions for Patients Receiving Chemotherapy  Today you received the following chemotherapy agents   To help prevent nausea and vomiting after your treatment, we encourage you to take your nausea medication   If you develop nausea and vomiting that is not controlled by your nausea medication, call the clinic.   BELOW ARE SYMPTOMS THAT SHOULD BE REPORTED IMMEDIATELY:  *FEVER GREATER THAN 100.5 F  *CHILLS WITH OR WITHOUT FEVER  NAUSEA AND VOMITING THAT IS NOT CONTROLLED WITH YOUR NAUSEA MEDICATION  *UNUSUAL SHORTNESS OF BREATH  *UNUSUAL BRUISING OR BLEEDING  TENDERNESS IN MOUTH AND THROAT WITH OR WITHOUT PRESENCE OF ULCERS  *URINARY PROBLEMS  *BOWEL PROBLEMS  UNUSUAL RASH Items with * indicate a potential emergency and should be followed up as soon as possible.  Feel free to call the clinic should you have any questions or concerns. The clinic phone number is [phone].  Please show the CHEMO ALERT CARD at check-in to the Emergency Department and triage nurse.   

## 2020-08-25 NOTE — Progress Notes (Signed)
Patient presents today for treatment and follow up visit with Dr. Delton Coombes. Vital signs within parameters for treatment. Labs reviewed by MD. Patient complains of discomfort around his PEG tube. No redness or drainage noted. Patient has concerns of not being able to swallow as well . Patient states he administered Enemas to himself and then developed diarrhea. Patient teaching performed. Patient has started PT on his Right foot. First PT appointment was on 08/24/2020.   Message received from [place] RN/ Dr. Delton Coombes. Proceed with treatment today.   Treatment given today per MD orders. Tolerated infusion without adverse affects. Vital signs stable. No complaints at this time. Discharged from clinic ambulatory in stable condition. Alert and oriented x 3. F/U with [facility] as scheduled.

## 2020-08-25 NOTE — Patient Instructions (Signed)
[place] at [facility] Discharge Instructions  You were seen today by Dr. Delton Coombes. He went over your recent results. You received your treatment today; next week is your off week. Dr. Delton Coombes will see you back in 2 weeks for labs and follow up.   Thank you for choosing [place] at [facility] to provide your oncology and hematology care.  To afford each patient quality time with our provider, please arrive at least 15 minutes before your scheduled appointment time.   If you have a lab appointment with the [place] please come in thru the Main Entrance and check in at the main information desk  You need to re-schedule your appointment should you arrive 10 or more minutes late.  We strive to give you quality time with our providers, and arriving late affects you and other patients whose appointments are after yours.  Also, if you no show three or more times for appointments you may be dismissed from the clinic at the providers discretion.     Again, thank you for choosing [facility].  Our hope is that these requests will decrease the amount of time that you wait before being seen by our physicians.       _____________________________________________________________  Should you have questions after your visit to [facility], please contact our office at [phone] between the hours of 8:00 a.m. and 4:30 p.m.  Voicemails left after 4:00 p.m. will not be returned until the following business day.  For prescription refill requests, have your pharmacy contact our office and allow 72 hours.    Cancer Center Support Programs:   > Cancer Support Group  2nd Tuesday of the month 1pm-2pm, Journey Room

## 2020-08-25 NOTE — Progress Notes (Signed)
Patient was assessed by Dr. Delton Coombes and labs have been reviewed.  Patient has lost 2 pounds. A message has been sent to our nutritionist.  Glucose 221, patient is okay to proceed with treatment today. Primary RN and pharmacy aware.

## 2020-08-26 ENCOUNTER — Ambulatory Visit (HOSPITAL_COMMUNITY): Payer: Medicare HMO

## 2020-08-26 ENCOUNTER — Other Ambulatory Visit (HOSPITAL_COMMUNITY): Payer: Medicare HMO

## 2020-08-26 ENCOUNTER — Ambulatory Visit (HOSPITAL_COMMUNITY): Payer: Medicare HMO | Admitting: Physical Therapy

## 2020-08-26 ENCOUNTER — Encounter (HOSPITAL_COMMUNITY): Payer: Self-pay | Admitting: Physical Therapy

## 2020-08-26 ENCOUNTER — Other Ambulatory Visit: Payer: Self-pay

## 2020-08-26 DIAGNOSIS — M6281 Muscle weakness (generalized): Secondary | ICD-10-CM | POA: Diagnosis not present

## 2020-08-26 DIAGNOSIS — M25671 Stiffness of right ankle, not elsewhere classified: Secondary | ICD-10-CM

## 2020-08-26 NOTE — Patient Instructions (Signed)
-   make a device where the bottom is rounded and the top is flat (like a bottle or coffee can underneath a board- OR a row of tennis balls). Then place feet on board (where bottle is on the ground and perpendicular to feet). Then rock the board forward, hold for 5 seconds and then rock the board backwards - using your right leg as much as possible - hold for 5 seconds and repeat. - standing on step drop heels off edge of step while holding on for balance - hold for 30 seconds and repeat for 3 times - stand with one foot in front of the other - near sturdy surface to hold onto as needed - try to balance in this position for 30 seconds- then switch legs and repeat. Perform 3 times with each leg in front - place ball (like small play ball or soccer ball) between the insides of your feet - then squeeze the ball with both feet, hold 5 seconds and repeat 5 times. Perform 4 sets

## 2020-08-26 NOTE — Therapy (Signed)
Timothy Oconnor, [place], [address] Phone: [phone]   Fax:  [phone]  Physical Therapy Treatment  Patient Details  Name: Timothy Oconnor MRN: [id number] Date of Birth: 10-Jan-1951 Referring Provider (PT): Donita Brooks    Encounter Date: 08/26/2020   PT End of Session - 08/26/20 1413    Visit Number 2    Number of Visits 4    Date for PT Re-Evaluation 09/14/20    Authorization Type [employer]na medicare    Progress Note Due on Visit 4    PT Start Time 1440    PT Stop Time 1520    PT Time Calculation (min) 40 min    Activity Tolerance Patient tolerated treatment well    Behavior During Therapy [facility] for tasks assessed/performed           Past Medical History:  Diagnosis Date  . 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 Anxiety   . Diabetes ([place])   . Heart attack ([place])   . Heart disease   . High cholesterol   . Hyperplasia of prostate   . Hypertension   . Low back pain   . Port-A-Cath in place 12/30/2019  . Tremor     Past Surgical History:  Procedure Laterality Date  . arterectomy     1993  . BIOPSY  12/01/2019   Procedure: BIOPSY;  Surgeon: Timothy Houston, MD;  Location[facility];  Service: Endoscopy;;  esophagus  . ESOPHAGOGASTRODUODENOSCOPY (EGD) WITH PROPOFOL N/A 12/01/2019   Procedure: ESOPHAGOGASTRODUODENOSCOPY (EGD) WITH PROPOFOL;  Surgeon: Timothy Houston, MD;  Location: [facility] SU[facility];  Service: Endoscopy;  Laterality: N/A;  . ESOPHAGOGASTRODUODENOSCOPY (EGD) WITH PROPOFOL N/A 12/24/2019   Procedure: ESOPHAGOGASTRODUODENOSCOPY (EGD) WITH PROPOFOL;  Surgeon: Timothy Signs, MD;  Location: [facility] ORS;  Service: General;  Laterality: N/A;  . Heart stents     2002, 2004, 2006  . PEG PLACEMENT N/A 12/24/2019   Procedure: PERCUTANEOUS ENDOSCOPIC GASTROSTOMY (PEG) PLACEMENT;  Surgeon: Timothy Signs, MD;  Location: [facility] ORS;  Service: General;  Laterality: N/A;  . PORTACATH PLACEMENT Left  12/24/2019   Procedure: INSERTION PORT-A-CATH;  Surgeon: Timothy Signs, MD;  Location: [facility] ORS;  Service: General;  Laterality: Left;    There were no vitals filed for this visit.   Subjective Assessment - 08/26/20 1452    Subjective States he has been doing his exercises at least 3x/day but some are just challenging (bringing foot/toes up).    Pertinent History Per imaging mets to both lumbar and thoracic area, undergoing radiataion    Currently in Pain? No/denies    Pain Onset More than a month ago              [facility] PT Assessment - 08/26/20 0001      Assessment   Medical Diagnosis Rt foot drop     Referring Provider (PT) Timothy Oconnor, Ludwig Lean     Onset Date/Surgical Date 06/23/20                         [facility] Adult PT Treatment/Exercise - 08/26/20 0001      Ankle Exercises: Seated   Other Seated Ankle Exercises ankle inversion isometric with ball 4x5 5" holds B     Other Seated Ankle Exercises AAROM DF on 1/2 foam roll 3x10 5" holds B, AROM  DF seated - with PT palpation of       Ankle Exercises: Stretches   Press photographer  3 reps;30 seconds   B on step      Ankle Exercises: Standing   Other Standing Ankle Exercises tandem with occassional support 3x30" B                     PT Short Term Goals - 08/24/20 7353      PT SHORT TERM GOAL #1   Title Pt to be I in HEP to help prevent any further loss in strength, ROM of Rt ankle to allow ease of ambulation    Time 1    Period Weeks    Status New    Target Date 08/31/20             PT Long Term Goals - 08/24/20 0954      PT LONG TERM GOAL #1   Title PT to be I in advance HEP to keep ROM and strength of RT ankle at optimal level for best gait .    Time 2    Period Weeks    Status New    Target Date 09/07/20      PT LONG TERM GOAL #2   Title PT to have referral for othotist for AFO on Rt LE    Time 2    Period Weeks    Status New      PT LONG TERM GOAL #3   Title PT to be able to  single leg stance on both LE for 10 seconds to decrease risk of falling    Time 2    Period Weeks    Status New                 Plan - 08/26/20 1453    Clinical Impression Statement Focused on progressing ankle ROM exercises, isometrics and adding all exercises to HEP. No pain reported but patient frustrated with current functional presentation. Reassured patient throughout session. Will continue with current POC and adding onto current HEP.    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/Moderate complexity    Rehab Potential Fair    PT Frequency 2x / week    PT Duration 2 weeks    PT Treatment/Interventions ADLs/Self Care Home Management;Therapeutic activities;Therapeutic exercise;Balance training;Orthotic Fit/Training;Patient/family education    PT Next Visit Plan isometric exercises , tandem stance with head turns for HEP progress to Tband    PT Home Exercise Plan given sitting dorsi/plantarflexion, standing heel raises, standing single leg stance at counter with wall on one side chair on the other.; 9/23 AAROM DF/PF on rocker board, tandem near counter, gastroc stretch, ankle inversion isometric           Patient will benefit from skilled therapeutic intervention in order to improve the following deficits and impairments:  Abnormal gait, Decreased balance, Decreased strength  Visit Diagnosis: Muscle weakness (generalized)  Stiffness of right ankle, not elsewhere classified     Problem List Patient Active Problem List   Diagnosis Date Noted  . Abdominal distension 08/03/2020  . PEG tube malfunction ([place]) 08/03/2020  . Bone metastases ([place]) 03/24/2020  . PEG (percutaneous endoscopic gastrostomy) status  (Timothy Oconnor) 01/08/2020  . Port-A-Cath in place 12/30/2019  . Goals of care, counseling/discussion 12/29/2019  . 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Timothy Oconnor)   . Esophageal cancer (Timothy Oconnor)   . Dysphagia 11/30/2019  .  HTN (hypertension) 11/30/2019  . Type 2 diabetes mellitus without complication ([place]) 53/96/7289  . HLD (hyperlipidemia) 11/30/2019  . BPH (benign prostatic hyperplasia) 11/30/2019  . Tremor 12/08/2015    3:24 PM, 08/26/20 Jerene Pitch, DPT Physical Therapy with [facility]  [phone] office   [place] [address] [place], [place], [address] Phone: [phone]   Fax:  [phone]  Name: Murat Rideout MRN: [id number] Date of Birth: 1951/09/20

## 2020-08-27 ENCOUNTER — Ambulatory Visit (HOSPITAL_COMMUNITY): Payer: Medicare HMO

## 2020-08-27 NOTE — Progress Notes (Signed)
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  Patient with disease progression.  Patient receiving paclitaxel and cyramza.    Spoke with patient via phone.  S/p medialization laryngoplasty on 9/16 at [facility].  Planning swallow study at [facility].  Patient sent message to MD today asking when swallow appointment will be set up.  Reports that he is tolerating ice cream, jello, sips of water, protein shake by mouth but really not much solid foods.  Reports it still wants to come back up.  Patient taking 6 cartons of osmolite 1.5 (2 cartons TID) and about the same amount of water via tube.  Gives water between feedings and with flushes to help hydration. Reports issues with constipation over the weekend from pain medications from surgery. Did not give 6 cartons of tube feeding per day due to stomach issues. Took laxative and enema and has been having diarrhea.  1 episode today and took 1 imodium.  Reports loose stool is improving.  States that his stomach must have shrunk.  Feels full for awhile after taking 2 cartons of feeding.    Last paracentesis on 9/8 with 5.8 L removed.   Reports that he feels better after this new chemotherapy than previous treatment.    Medications: reviewed  Labs: glucose 221  Anthropometrics:   Weight 185 lb 1.6 oz on 9/22 decreased from 195 lb 11.2 oz on 9/8 (prior to 5.8 L removed)   Estimated Energy Needs  Kcals: 2400-2800 Protein: 120-140 g Fluid: 2.4 L  NUTRITION DIAGNOSIS: Inadequate oral intake continues   INTERVENTION:  Patient to f/u on getting swallow test schedule at [facility] 1.5, 6 cartons per day. Discussed trying 1 carton of feeding q 3-4 hours vs 2 cartons TID.   Continue oral intake per MD/SLP recommendations.   Patient has contact information    MONITORING, EVALUATION, GOAL: weight trends, intake, tube feeding   NEXT VISIT: October 15 phone f/u  [place] Monica B. Zenia Resides, [place], [place] Registered Dietitian [phone] (mobile)

## 2020-08-30 ENCOUNTER — Telehealth (HOSPITAL_COMMUNITY): Payer: Self-pay | Admitting: Physical Therapy

## 2020-08-30 ENCOUNTER — Ambulatory Visit (HOSPITAL_COMMUNITY): Payer: Medicare HMO | Admitting: Physical Therapy

## 2020-08-30 NOTE — Telephone Encounter (Signed)
pt's wife called to cx today's appt due to her husband is sick from his cancer treatments

## 2020-09-02 ENCOUNTER — Other Ambulatory Visit: Payer: Self-pay

## 2020-09-02 ENCOUNTER — Ambulatory Visit (HOSPITAL_COMMUNITY): Payer: Medicare HMO | Admitting: Physical Therapy

## 2020-09-02 DIAGNOSIS — M6281 Muscle weakness (generalized): Secondary | ICD-10-CM

## 2020-09-02 DIAGNOSIS — M25671 Stiffness of right ankle, not elsewhere classified: Secondary | ICD-10-CM

## 2020-09-02 NOTE — Therapy (Signed)
[place], [place], [address] Phone: [phone]   Fax:  [phone]  Physical Therapy Treatment  Patient Details  Name: Timothy Oconnor MRN: [id number] Date of Birth: 1951/11/08 Referring Provider (PT): Timothy Oconnor    Encounter Date: 09/02/2020   PT End of Session - 09/02/20 1043    Visit Number 3    Number of Visits 4    Date for PT Re-Evaluation 09/14/20    Authorization Type A[employer]na medicare    Progress Note Due on Visit 4    PT Start Time 1004    PT Stop Time 1048    PT Time Calculation (min) 44 min    Activity Tolerance Patient tolerated treatment well    Behavior During Therapy [facility] for tasks assessed/performed           Past Medical History:  Diagnosis Date  . 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 Anxiety   . Diabetes ([place])   . Heart attack ([place])   . Heart disease   . High cholesterol   . Hyperplasia of prostate   . Hypertension   . Low back pain   . Port-A-Cath in place 12/30/2019  . Tremor     Past Surgical History:  Procedure Laterality Date  . arterectomy     1993  . BIOPSY  12/01/2019   Procedure: BIOPSY;  Surgeon: Rogene Houston, MD;  Location[facility];  Service: Endoscopy;;  esophagus  . ESOPHAGOGASTRODUODENOSCOPY (EGD) WITH PROPOFOL N/A 12/01/2019   Procedure: ESOPHAGOGASTRODUODENOSCOPY (EGD) WITH PROPOFOL;  Surgeon: Rogene Houston, MD;  Location: [facility];  Service: Endoscopy;  Laterality: N/A;  . ESOPHAGOGASTRODUODENOSCOPY (EGD) WITH PROPOFOL N/A 12/24/2019   Procedure: ESOPHAGOGASTRODUODENOSCOPY (EGD) WITH PROPOFOL;  Surgeon: Aviva Signs, MD;  Location: [facility] ORS;  Service: General;  Laterality: N/A;  . Heart stents     2002, 2004, 2006  . PEG PLACEMENT N/A 12/24/2019   Procedure: PERCUTANEOUS ENDOSCOPIC GASTROSTOMY (PEG) PLACEMENT;  Surgeon: Aviva Signs, MD;  Location: [facility] ORS;  Service: General;  Laterality: N/A;  . PORTACATH PLACEMENT Left  12/24/2019   Procedure: INSERTION PORT-A-CATH;  Surgeon: Aviva Signs, MD;  Location: [facility] ORS;  Service: General;  Laterality: Left;    There were no vitals filed for this visit.   Subjective Assessment - 09/02/20 1010    Subjective pt states he's been compliant with HEP.  No pain or issues today other than general weakness from his last chemo treatment.    Currently in Pain? No/denies                             [facility] Adult PT Treatment/Exercise - 09/02/20 0001      Ankle Exercises: Standing   Vector Stance 5 reps;5 seconds   1 UE assist   SLS x3 B   max of 4" Lt , 2" Rt   Rocker Board Limitations   A/P and Rt/Lt with body stable 15 reps   Heel Raises Both;15 reps    Toe Walk (Round Trip) LUNGES onto 4" step, no UE 15 reps each     Other Standing Ankle Exercises tandem with occassional support 3x30" B     Other Standing Ankle Exercises tandem stance 3X30" holds with intermittent HHA both LE leading      Ankle Exercises: Stretches   Slant Board Stretch 3 reps;30 seconds      Ankle Exercises: Seated   Other  Seated Ankle Exercises ankle inversion isometric with ball 3X10 5" holds      Other Seated Ankle Exercises AAROM DF on 1/2 foam roll 3x10 5" holds B                    PT Short Term Goals - 08/24/20 0350      PT SHORT TERM GOAL #1   Title Pt to be I in HEP to help prevent any further loss in strength, ROM of Rt ankle to allow ease of ambulation    Time 1    Period Weeks    Status New    Target Date 08/31/20             PT Long Term Goals - 08/24/20 0954      PT LONG TERM GOAL #1   Title PT to be I in advance HEP to keep ROM and strength of RT ankle at optimal level for best gait .    Time 2    Period Weeks    Status New    Target Date 09/07/20      PT LONG TERM GOAL #2   Title PT to have referral for othotist for AFO on Rt LE    Time 2    Period Weeks    Status New      PT LONG TERM GOAL #3   Title PT to be able to single leg  stance on both LE for 10 seconds to decrease risk of falling    Time 2    Period Weeks    Status New                 Plan - 09/02/20 1044    Clinical Impression Statement Continued with primary focus on improving ankle strength and overall stability.   Continues to be most challenged with static stability exercises without use of UE's.  Max of 4" on Lt and 2" on Rt without UE for single leg stance and max of 10" without needing UE assist with tandem.   Added vector stance and lunges without UE to help improve stability and with noted challenge.  Early fatigue of Rt DF with use of foam roll but with approx. 5 degrees of movement from 10 degrees PF.  Pt required several seated rest breaks this session due to fatigue.    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/Moderate complexity    Rehab Potential Fair    PT Frequency 2x / week    PT Duration 2 weeks    PT Treatment/Interventions ADLs/Self Care Home Management;Therapeutic activities;Therapeutic exercise;Balance training;Orthotic Fit/Training;Patient/family education    PT Next Visit Plan continue to progress ankle strength and LE stability.  Provide with rest breaks as needed.    PT Home Exercise Plan given sitting dorsi/plantarflexion, standing heel raises, standing single leg stance at counter with wall on one side chair on the other.; 9/23 AAROM DF/PF on rocker board, tandem near counter, gastroc stretch, ankle inversion isometric           Patient will benefit from skilled therapeutic intervention in order to improve the following deficits and impairments:  Abnormal gait, Decreased balance, Decreased strength  Visit Diagnosis: Stiffness of right ankle, not  elsewhere classified  Muscle weakness (generalized)     Problem List  Patient Active Problem List   Diagnosis Date Noted  . Abdominal distension 08/03/2020  . PEG tube malfunction ([place]) 08/03/2020  . Bone metastases ([place]) 03/24/2020  . PEG (percutaneous endoscopic gastrostomy) status ([place]) 01/08/2020  . Port-A-Cath in place 12/30/2019  . Goals of care, counseling/discussion 12/29/2019  . 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 Esophageal cancer ([place])   . Dysphagia 11/30/2019  . HTN (hypertension) 11/30/2019  . Type 2 diabetes mellitus without complication ([place]) 79/98/7215  . HLD (hyperlipidemia) 11/30/2019  . BPH (benign prostatic hyperplasia) 11/30/2019  . Tremor 12/08/2015   Teena Irani, PTA/CLT [phone]  Teena Irani 09/02/2020, 10:46 AM  [place], [place], [address] Phone: [phone]   Fax:  [phone]  Name: Timothy Oconnor MRN: [id number] Date of Birth: 06-Feb-1951

## 2020-09-07 ENCOUNTER — Other Ambulatory Visit: Payer: Self-pay

## 2020-09-07 ENCOUNTER — Ambulatory Visit (HOSPITAL_COMMUNITY): Payer: Medicare HMO | Attending: Hematology | Admitting: Physical Therapy

## 2020-09-07 DIAGNOSIS — M6281 Muscle weakness (generalized): Secondary | ICD-10-CM

## 2020-09-07 DIAGNOSIS — M25671 Stiffness of right ankle, not elsewhere classified: Secondary | ICD-10-CM

## 2020-09-07 NOTE — Therapy (Signed)
[place], [place], [address] Phone: [phone]   Fax:  [phone]  Physical Therapy Treatment  Patient Details  Name: Timothy Oconnor MRN: [id number] Date of Birth: 1951-11-10 Referring Provider (PT): Sreedher, Ludwig Lean    PHYSICAL THERAPY DISCHARGE SUMMARY  Visits from Start of Care: 4  Current functional level related to goals / functional outcomes: Rt ankle remains weak but all mm except DF have improved  Remaining deficits: Foot drop due to mm weakness   Education / Equipment: HEP and referred for an AFO Plan: Patient agrees to discharge.  Patient goals were met. Patient is being discharged due to meeting the stated rehab goals.  ?????     Encounter Date: 09/07/2020   PT End of Session - 09/07/20 0944    Visit Number 4    Number of Visits 4    Date for PT Re-Evaluation 09/14/20    Authorization Type [employer] medicare    Progress Note Due on Visit 4    PT Start Time 0920    PT Stop Time 0955    PT Time Calculation (min) 35 min    Activity Tolerance Patient tolerated treatment well    Behavior During Therapy [facility] for tasks assessed/performed           Past Medical History:  Diagnosis Date  . 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 Anxiety   . Diabetes ([place])   . Heart attack ([place])   . Heart disease   . High cholesterol   . Hyperplasia of prostate   . Hypertension   . Low back pain   . Port-A-Cath in place 12/30/2019  . Tremor     Past Surgical History:  Procedure Laterality Date  . arterectomy     1993  . BIOPSY  12/01/2019   Procedure: BIOPSY;  Surgeon: Rogene Houston, MD;  Location: [facility];  Service: Endoscopy;;  esophagus  . ESOPHAGOGASTRODUODENOSCOPY (EGD) WITH PROPOFOL N/A 12/01/2019   Procedure: ESOPHAGOGASTRODUODENOSCOPY (EGD) WITH PROPOFOL;  Surgeon: Rogene Houston, MD;  Location: [facility];  Service: Endoscopy;  Laterality: N/A;  .  ESOPHAGOGASTRODUODENOSCOPY (EGD) WITH PROPOFOL N/A 12/24/2019   Procedure: ESOPHAGOGASTRODUODENOSCOPY (EGD) WITH PROPOFOL;  Surgeon: Aviva Signs, MD;  Location: [facility] ORS;  Service: General;  Laterality: N/A;  . Heart stents     2002, 2004, 2006  . PEG PLACEMENT N/A 12/24/2019   Procedure: PERCUTANEOUS ENDOSCOPIC GASTROSTOMY (PEG) PLACEMENT;  Surgeon: Aviva Signs, MD;  Location: [facility] OR[facility];  Service: General;  Laterality: N/A;  . PORTACATH PLACEMENT Left 12/24/2019   Procedure: INSERTION PORT-A-CATH;  Surgeon: Aviva Signs, MD;  Location: [facility] OR[facility];  Service: General;  Laterality: Left;    There were no vitals filed for this visit.   Subjective Assessment - 09/07/20 0927    Subjective PT states that he is doing his exercise but he does not see a whole lot of improvement.    Currently in Pain? No/denies              [facility] PT Assessment - 09/07/20 0001      Assessment   Medical Diagnosis Rt foot drop     Referring Provider (PT) Koren Shiver, Ludwig Lean     Onset Date/Surgical Date 06/23/20    Next MD Visit 08/25/2020    Prior Therapy none      Precautions   Precautions None    Required Braces or Orthoses --   requesting AFO     Restrictions  Weight Bearing Restrictions No      Home Environment   Living Environment Private residence      Prior Function   Level of Independence Independent      Functional Tests   Functional tests Single leg stance      Single Leg Stance   Comments Rt: 10" ; Lt:  12"       Strength   Right Ankle Dorsiflexion 2+/5    Right Ankle Plantar Flexion 4/5    Right Ankle Inversion 4/5   was 3/5    Right Ankle Eversion 3+/5   was 3-    Left Ankle Dorsiflexion 5/5    Left Ankle Plantar Flexion 5/5   was 4/5    Left Ankle Inversion 5/5   was 4/5   Left Ankle Eversion 5/5   was 4/5      Balance   Balance Assessed --   Tandem stance able for 35mn; single leg Lt 5"; Rt 0                                PT Education - 09/07/20 0955     Education Details HEp for tband exercises    Person(s) Educated Patient    Methods Explanation;Demonstration;Verbal cues;Handout    Comprehension Verbalized understanding;Returned demonstration            PT Short Term Goals - 09/07/20 0947      PT SHORT TERM GOAL #1   Title Pt to be I in HEP to help prevent any further loss in strength, ROM of Rt ankle to allow ease of ambulation    Time 1    Period Weeks    Status Achieved    Target Date 08/31/20             PT Long Term Goals - 09/07/20 0948      PT LONG TERM GOAL #1   Title PT to be I in advance HEP to keep ROM and strength of RT ankle at optimal level for best gait .    Time 2    Period Weeks    Status Achieved      PT LONG TERM GOAL #2   Title PT to have referral for othotist for AFO on Rt LE    Time 2    Period Weeks    Status Achieved      PT LONG TERM GOAL #3   Title PT to be able to single leg stance on both LE for 10 seconds to decrease risk of falling    Time 2    Period Weeks    Status Achieved                 Plan - 09/07/20 0945    Clinical Impression Statement Mr. HLauderreassessed he has improved in all aspects except RT dorsiflexion for which he was referred.  He has just now recieved the order for an AFO and will call an orthotist today.  He is I with his exercises and ready for discharge.    Personal Factors and Comorbidities Comorbidity 1;Time since onset of injury/illness/exacerbation    Comorbidities Cancer with mets,    Examination-Activity Limitations Carry;Lift;Locomotion Level    Examination-Participation Restrictions Community Activity;Yard Work    SMerchant navy officerEvolving/Moderate complexity    Rehab Potential Fair    PT Frequency 2x / week    PT Duration 2 weeks  PT Treatment/Interventions ADLs/Self Care Home Management;Therapeutic activities;Therapeutic exercise;Balance training;Orthotic Fit/Training;Patient/family education    PT Next Visit Plan  Discharge.    PT Home Exercise Plan given sitting dorsi/plantarflexion, standing heel raises, standing single leg stance at counter with wall on one side chair on the other.; 9/23 AAROM DF/PF on rocker board, tandem near counter, gastroc stretch, ankle inversion isometric;10/5:T band exercises           Patient will benefit from skilled therapeutic intervention in order to improve the following deficits and impairments:  Abnormal gait, Decreased balance, Decreased strength  Visit Diagnosis: Stiffness of right ankle, not elsewhere classified  Muscle weakness (generalized)     Problem List Patient Active Problem List   Diagnosis Date Noted  . Abdominal distension 08/03/2020  . PEG tube malfunction ([place]) 08/03/2020  . Bone metastases ([place]) 03/24/2020  . PEG (percutaneous endoscopic gastrostomy) status ([place]) 01/08/2020  . Port-A-Cath in place 12/30/2019  . Goals of care, counseling/discussion 12/29/2019  . 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 Esophageal cancer ([place])   . Dysphagia 11/30/2019  . HTN (hypertension) 11/30/2019  . Type 2 diabetes mellitus without complication ([place]) 11/94/1740  . HLD (hyperlipidemia) 11/30/2019  . BPH (benign prostatic hyperplasia) 11/30/2019  . Tremor 12/08/2015   Rayetta Humphrey, PT CLT [phone] 09/07/2020, 9:57 AM  [facility] [address] [place], [place], [address] Phone: [phone]   Fax:  [phone]  Name: Olander Friedl MRN: [id number] Date of Birth: 1951/02/08

## 2020-09-08 ENCOUNTER — Inpatient Hospital Stay (HOSPITAL_BASED_OUTPATIENT_CLINIC_OR_DEPARTMENT_OTHER): Payer: Medicare HMO | Admitting: Hematology

## 2020-09-08 ENCOUNTER — Inpatient Hospital Stay (HOSPITAL_COMMUNITY): Payer: Medicare HMO

## 2020-09-08 ENCOUNTER — Other Ambulatory Visit (HOSPITAL_COMMUNITY): Payer: Self-pay | Admitting: *Deleted

## 2020-09-08 ENCOUNTER — Inpatient Hospital Stay (HOSPITAL_COMMUNITY): Payer: Medicare HMO | Attending: Hematology

## 2020-09-08 VITALS — BP 104/67 | HR 79 | Temp 97.6°F | Resp 18

## 2020-09-08 VITALS — BP 103/71 | HR 103 | Temp 96.9°F | Resp 18 | Wt 183.2 lb

## 2020-09-08 DIAGNOSIS — C160 Malignant neoplasm of cardia: Secondary | ICD-10-CM | POA: Insufficient documentation

## 2020-09-08 DIAGNOSIS — Z7984 Long term (current) use of oral hypoglycemic drugs: Secondary | ICD-10-CM | POA: Diagnosis not present

## 2020-09-08 DIAGNOSIS — F419 Anxiety disorder, unspecified: Secondary | ICD-10-CM

## 2020-09-08 DIAGNOSIS — Z5111 Encounter for antineoplastic chemotherapy: Secondary | ICD-10-CM | POA: Insufficient documentation

## 2020-09-08 DIAGNOSIS — C7951 Secondary malignant neoplasm of bone: Secondary | ICD-10-CM | POA: Insufficient documentation

## 2020-09-08 DIAGNOSIS — Z79899 Other long term (current) drug therapy: Secondary | ICD-10-CM | POA: Insufficient documentation

## 2020-09-08 DIAGNOSIS — R490 Dysphonia: Secondary | ICD-10-CM | POA: Diagnosis not present

## 2020-09-08 DIAGNOSIS — Z95828 Presence of other vascular implants and grafts: Secondary | ICD-10-CM

## 2020-09-08 DIAGNOSIS — R634 Abnormal weight loss: Secondary | ICD-10-CM | POA: Insufficient documentation

## 2020-09-08 DIAGNOSIS — R188 Other ascites: Secondary | ICD-10-CM | POA: Diagnosis not present

## 2020-09-08 DIAGNOSIS — R110 Nausea: Secondary | ICD-10-CM | POA: Diagnosis not present

## 2020-09-08 DIAGNOSIS — E119 Type 2 diabetes mellitus without complications: Secondary | ICD-10-CM | POA: Diagnosis not present

## 2020-09-08 DIAGNOSIS — I10 Essential (primary) hypertension: Secondary | ICD-10-CM | POA: Insufficient documentation

## 2020-09-08 DIAGNOSIS — Z5189 Encounter for other specified aftercare: Secondary | ICD-10-CM | POA: Insufficient documentation

## 2020-09-08 DIAGNOSIS — J3801 Paralysis of vocal cords and larynx, unilateral: Secondary | ICD-10-CM | POA: Insufficient documentation

## 2020-09-08 LAB — COMPREHENSIVE METABOLIC PANEL
ALT: 31 U/L (ref 0–44)
AST: 27 U/L (ref 15–41)
Albumin: 2.9 g/dL — ABNORMAL LOW (ref 3.5–5.0)
Alkaline Phosphatase: 59 U/L (ref 38–126)
Anion gap: 11 (ref 5–15)
BUN: 17 mg/dL (ref 8–23)
CO2: 23 mmol/L (ref 22–32)
Calcium: 8.9 mg/dL (ref 8.9–10.3)
Chloride: 97 mmol/L — ABNORMAL LOW (ref 98–111)
Creatinine, Ser: 0.79 mg/dL (ref 0.61–1.24)
GFR calc non Af Amer: >60 mL/min (ref >60)
Glucose, Bld: 183 mg/dL — ABNORMAL HIGH (ref 70–99)
Potassium: 4.2 mmol/L (ref 3.5–5.1)
Sodium: 131 mmol/L — ABNORMAL LOW (ref 135–145)
Total Bilirubin: 0.4 mg/dL (ref 0.3–1.2)
Total Protein: 7.1 g/dL (ref 6.5–8.1)

## 2020-09-08 LAB — CBC WITH DIFFERENTIAL/PLATELET
Abs Immature Granulocytes: 0.02 10*3/uL (ref 0.00–0.07)
Basophils Absolute: 0.1 10*3/uL (ref 0.0–0.1)
Basophils Relative: 1 %
Eosinophils Absolute: 0 10*3/uL (ref 0.0–0.5)
Eosinophils Relative: 1 %
HCT: 38.1 % — ABNORMAL LOW (ref 39.0–52.0)
Hemoglobin: 12.1 g/dL — ABNORMAL LOW (ref 13.0–17.0)
Immature Granulocytes: 0 %
Lymphocytes Relative: 32 %
Lymphs Abs: 1.6 10*3/uL (ref 0.7–4.0)
MCH: 29.4 pg (ref 26.0–34.0)
MCHC: 31.8 g/dL (ref 30.0–36.0)
MCV: 92.5 fL (ref 80.0–100.0)
Monocytes Absolute: 0.8 10*3/uL (ref 0.1–1.0)
Monocytes Relative: 17 %
Neutro Abs: 2.4 10*3/uL (ref 1.7–7.7)
Neutrophils Relative %: 49 %
Platelets: 433 10*3/uL — ABNORMAL HIGH (ref 150–400)
RBC: 4.12 MIL/uL — ABNORMAL LOW (ref 4.22–5.81)
RDW: 15.8 % — ABNORMAL HIGH (ref 11.5–15.5)
WBC: 4.9 10*3/uL (ref 4.0–10.5)
nRBC: 0 % (ref 0.0–0.2)

## 2020-09-08 LAB — TSH: TSH: 5.817 u[IU]/mL — ABNORMAL HIGH (ref 0.350–4.500)

## 2020-09-08 LAB — URINALYSIS, DIPSTICK ONLY
Bilirubin Urine: NEGATIVE
Glucose, UA: NEGATIVE mg/dL
Hgb urine dipstick: NEGATIVE
Ketones, ur: NEGATIVE mg/dL
Leukocytes,Ua: NEGATIVE
Nitrite: NEGATIVE
Protein, ur: 30 mg/dL — AB
Specific Gravity, Urine: 1.026 (ref 1.005–1.030)
pH: 5 (ref 5.0–8.0)

## 2020-09-08 MED ORDER — SODIUM CHLORIDE 0.9 % IV SOLN
64.0000 mg/m2 | Freq: Once | INTRAVENOUS | Status: AC
  Administered 2020-09-08: 138 mg via INTRAVENOUS
  Filled 2020-09-08: qty 23

## 2020-09-08 MED ORDER — SODIUM CHLORIDE 0.9 % IV SOLN
8.0000 mg/kg | Freq: Once | INTRAVENOUS | Status: AC
  Administered 2020-09-08: 700 mg via INTRAVENOUS
  Filled 2020-09-08: qty 20

## 2020-09-08 MED ORDER — ALPRAZOLAM 0.5 MG PO TABS: 0.5000 mg | ORAL_TABLET | Freq: Two times a day (BID) | ORAL | 0 refills | Status: AC | PRN

## 2020-09-08 MED ORDER — FAMOTIDINE IN NACL 20-0.9 MG/50ML-% IV SOLN
INTRAVENOUS | Status: AC
  Filled 2020-09-08: qty 50

## 2020-09-08 MED ORDER — HEPARIN SOD (PORK) LOCK FLUSH 100 UNIT/ML IV SOLN
500.0000 [IU] | Freq: Once | INTRAVENOUS | Status: AC | PRN
  Administered 2020-09-08: 500 [IU]

## 2020-09-08 MED ORDER — SODIUM CHLORIDE 0.9 % IV SOLN
20.0000 mg | Freq: Once | INTRAVENOUS | Status: AC
  Administered 2020-09-08: 20 mg via INTRAVENOUS
  Filled 2020-09-08: qty 20

## 2020-09-08 MED ORDER — FAMOTIDINE IN NACL 20-0.9 MG/50ML-% IV SOLN
20.0000 mg | Freq: Once | INTRAVENOUS | Status: AC
  Administered 2020-09-08: 20 mg via INTRAVENOUS

## 2020-09-08 MED ORDER — DIPHENHYDRAMINE HCL 50 MG/ML IJ SOLN
50.0000 mg | Freq: Once | INTRAMUSCULAR | Status: AC
  Administered 2020-09-08: 50 mg via INTRAVENOUS

## 2020-09-08 MED ORDER — DIPHENHYDRAMINE HCL 50 MG/ML IJ SOLN
INTRAMUSCULAR | Status: AC
  Filled 2020-09-08: qty 1

## 2020-09-08 MED ORDER — SODIUM CHLORIDE 0.9 % IV SOLN: Freq: Once | INTRAVENOUS | Status: AC

## 2020-09-08 NOTE — Progress Notes (Signed)
Tolerated infusions w/o adverse reaction.  Alert, in no distress.  VSS.  Discharged ambulatory in stable condition.  

## 2020-09-08 NOTE — Patient Instructions (Signed)
[facility] Discharge Instructions  You were seen today by Dr. Katragadda. He went over your recent results. You received your treatment today; continue receiving your weekly treatment. Dr. Katragadda will see you back in 2 weeks for labs and follow up.   Thank you for choosing [facility] at [facility] to provide your oncology and hematology care.  To afford each patient quality time with our provider, please arrive at least 15 minutes before your scheduled appointment time.   If you have a lab appointment with the Cancer Center please come in thru the Main Entrance and check in at the main information desk  You need to re-schedule your appointment should you arrive 10 or more minutes late.  We strive to give you quality time with our providers, and arriving late affects you and other patients whose appointments are after yours.  Also, if you no show three or more times for appointments you may be dismissed from the clinic at the providers discretion.     Again, thank you for choosing [facility].  Our hope is that these requests will decrease the amount of time that you wait before being seen by our physicians.       _____________________________________________________________  Should you have questions after your visit to  Cancer Center, please contact our office at [phone] between the hours of 8:00 a.m. and 4:30 p.m.  Voicemails left after 4:00 p.m. will not be returned until the following business day.  For prescription refill requests, have your pharmacy contact our office and allow 72 hours.    Cancer Center Support Programs:   > Cancer Support Group  2nd Tuesday of the month 1pm-2pm, Journey Room    

## 2020-09-08 NOTE — Patient Instructions (Signed)
[facility] Discharge Instructions for Patients Receiving Chemotherapy   Beginning January 23rd 2017 lab work for the Cancer Center will be done in the  Main lab at [facility] on 1st floor. If you have a lab appointment with the Cancer Center please come in thru the  Main Entrance and check in at the main information desk   Today you received the following chemotherapy agents:  Cyramza and Taxotere  To help prevent nausea and vomiting after your treatment, we encourage you to take your nausea medication as prescribed.  If you develop nausea and vomiting, or diarrhea that is not controlled by your medication, call the clinic.  The clinic phone number is [phone]. Office hours are Monday-Friday 8:30am-5:00pm.  BELOW ARE SYMPTOMS THAT SHOULD BE REPORTED IMMEDIATELY:  *FEVER GREATER THAN 101.0 F  *CHILLS WITH OR WITHOUT FEVER  NAUSEA AND VOMITING THAT IS NOT CONTROLLED WITH YOUR NAUSEA MEDICATION  *UNUSUAL SHORTNESS OF BREATH  *UNUSUAL BRUISING OR BLEEDING  TENDERNESS IN MOUTH AND THROAT WITH OR WITHOUT PRESENCE OF ULCERS  *URINARY PROBLEMS  *BOWEL PROBLEMS  UNUSUAL RASH Items with * indicate a potential emergency and should be followed up as soon as possible. If you have an emergency after office hours please contact your primary care physician or go to the nearest emergency department.  Please call the clinic during office hours if you have any questions or concerns.   You may also contact the Patient Navigator at [phone] should you have any questions or need assistance in obtaining follow up care.      Resources For Cancer Patients and their Caregivers ? American Cancer Society: Can assist with transportation, wigs, general needs, runs Look Good Feel Better.        1-888-227-6333 ? Cancer Care: Provides financial assistance, online support groups, medication/co-pay assistance.  1-800-813-HOPE (4673) ? [facility] Assists [place] cancer patients and their families through emotional , educational and financial support.  336-427-4357 ? [place] DSS Where to apply for food stamps, Medicaid and utility assistance. 336-342-1394 ? [facility]: Transportation to medical appointments. 336-347-2287 ? Social Security Administration: May apply for disability if have a Stage IV cancer. 336-342-7796 1-800-772-1213 ? [place] Aging, Disability and Transit Services: Assists with nutrition, care and transit needs. 336-349-2343         

## 2020-09-08 NOTE — Progress Notes (Signed)
Patient was assessed by Dr. Delton Coombes and labs have been reviewed.  Patient has peripheral neuropathy, we will cut back on taxol by 20% today.  Patient is getting paracentesis tomorrow.  HR today is 103, Dr. Raliegh Ip is aware and patient is okay to proceed with treatment today. Primary RN and pharmacy aware.

## 2020-09-08 NOTE — Progress Notes (Signed)
Patients port flushed without difficulty.  Good blood return noted with no bruising or swelling noted at site.  Dressing applied.  Patient accessed for treatment.  

## 2020-09-08 NOTE — Progress Notes (Signed)
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  PRIOR THERAPY: FOLFOX and nivolumab x 14 cycles from 12/31/2019 to 07/26/2020  NGS Results: PD-L1 CPS 5%, Foundation 1 MS--stable, TMB 8 Muts/Mb  CURRENT THERAPY: Paclitaxel 3 weeks on, 1 week off; ramucirumab  BRIEF ONCOLOGIC HISTORY:  Oncology History  Esophageal cancer ([place])   Initial Diagnosis   Esophageal cancer ([place])    Genetic Testing   Foundation One and PDL-1 Results:       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[place])  12/24/2019 Initial Diagnosis   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[place])   12/29/2019 Cancer Staging   Staging form: Esophagus - Adenocarcinoma, AJCC 8th Edition - Clinical stage from 12/29/2019: Stage IVB (cTX, cN3, cM1) - Signed by Derek Jack, MD on 12/29/2019   12/31/2019 - 07/28/2020 Chemotherapy   The patient had palonosetron (ALOXI) injection 0.25 mg, 0.25 mg, Intravenous,  Once, 14 of 16 cycles Administration: 0.25 mg (12/31/2019), 0.25 mg (01/14/2020), 0.25 mg (01/28/2020), 0.25 mg (02/11/2020), 0.25 mg (02/25/2020), 0.25 mg (03/10/2020), 0.25 mg (03/24/2020), 0.25 mg (04/12/2020), 0.25 mg (04/26/2020), 0.25 mg (05/10/2020), 0.25 mg (05/24/2020), 0.25 mg (06/28/2020), 0.25 mg (07/12/2020), 0.25 mg (07/26/2020) leucovorin 868 mg in dextrose 5 % 250 mL infusion, 400 mg/m2 = 868 mg, Intravenous,  Once, 14 of 16 cycles Administration: 868 mg (12/31/2019), 868 mg (01/14/2020), 868 mg (01/28/2020), 868 mg (02/11/2020), 868 mg (02/25/2020), 868 mg (03/10/2020), 868 mg (03/24/2020), 868 mg (04/12/2020), 868 mg (04/26/2020), 868 mg (05/10/2020), 868 mg (05/24/2020), 868 mg (06/28/2020), 868 mg (07/12/2020), 868 mg (07/26/2020) oxaliplatin (ELOXATIN) 150 mg in dextrose 5 % 500 mL chemo infusion, 68 mg/m2 = 150 mg (80 %  of original dose 85 mg/m2), Intravenous,  Once, 13 of 15 cycles Dose modification: 68 mg/m2 (80 % of original dose 85 mg/m2, Cycle 1, Reason: Other (see comments), Comment: diabetic neuropathy) Administration: 150 mg (12/31/2019), 150 mg (01/14/2020), 150 mg (01/28/2020), 150 mg (02/11/2020), 150 mg (02/25/2020), 150 mg (03/10/2020), 150 mg (03/24/2020), 150 mg (04/12/2020), 150 mg (04/26/2020), 150 mg (05/10/2020), 150 mg (05/24/2020), 150 mg (06/28/2020), 150 mg (07/12/2020) fluorouracil (ADRUCIL) chemo injection 850 mg, 400 mg/m2 = 850 mg, Intravenous,  Once, 14 of 16 cycles Administration: 850 mg (12/31/2019), 850 mg (01/14/2020), 850 mg (01/28/2020), 850 mg (02/11/2020), 850 mg (02/25/2020), 850 mg (03/10/2020), 850 mg (03/24/2020), 850 mg (04/12/2020), 850 mg (04/26/2020), 850 mg (05/10/2020), 850 mg (05/24/2020), 850 mg (06/28/2020), 850 mg (07/26/2020) fluorouracil (ADRUCIL) 5,000 mg in sodium chloride 0.9 % 150 mL chemo infusion, 2,310 mg/m2 = 5,200 mg, Intravenous, 1 Day/Dose, 14 of 16 cycles Administration: 5,000 mg (12/31/2019), 5,000 mg (01/14/2020), 5,000 mg (01/28/2020), 5,000 mg (02/11/2020), 5,000 mg (02/25/2020), 5,000 mg (03/10/2020), 5,000 mg (03/24/2020), 5,000 mg (04/12/2020), 5,000 mg (04/26/2020), 5,000 mg (05/10/2020), 5,000 mg (05/24/2020), 5,200 mg (06/28/2020), 5,200 mg (07/26/2020) nivolumab (OPDIVO) 240 mg in sodium chloride 0.9 % 100 mL chemo infusion, 240 mg (100 % of original dose 240 mg), Intravenous, Once, 14 of 16 cycles Dose modification: 240 mg (original dose 240 mg, Cycle 1, Reason: Provider Judgment) Administration: 240 mg (12/31/2019), 240 mg (01/14/2020), 240 mg (01/28/2020), 240 mg (02/11/2020), 240 mg (02/25/2020), 240 mg (03/10/2020), 240 mg (03/24/2020), 240 mg (04/12/2020), 240 mg (04/26/2020), 240 mg (05/10/2020), 240 mg (05/24/2020), 240 mg (06/28/2020), 240 mg (07/12/2020), 240 mg (  07/26/2020)  for chemotherapy treatment.     Victor One and PDL-1 Results:       08/11/2020 -  Chemotherapy    The patient had PACLitaxel (TAXOL) 168 mg in sodium chloride 0.9 % 250 mL chemo infusion (</= 52m/m2), 80 mg/m2 = 168 mg, Intravenous,  Once, 1 of 6 cycles Administration: 168 mg (08/11/2020), 168 mg (08/20/2020), 168 mg (08/25/2020) ramucirumab (CYRAMZA) 700 mg in sodium chloride 0.9 % 180 mL chemo infusion, 8 mg/kg = 700 mg, Intravenous, Once, 1 of 6 cycles Administration: 700 mg (08/11/2020), 700 mg (08/25/2020)  for chemotherapy treatment.      CANCER STAGING: Cancer Staging 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[facility] Staging form: Esophagus - Adenocarcinoma, AJCC 8th Edition - Clinical stage from 12/29/2019: Stage IVB (cTX, cN3, cM1) - Signed by KDerek Jack MD on 12/29/2019   INTERVAL HISTORY:  Mr. REthridge Sollenberger a 69y.o. male, returns for routine follow-up and consideration for next cycle of chemotherapy. RMarilynwas last seen on 08/25/2020.  Due for cycle #2 of paclitaxel and ramucirumab today.   Overall, he tells me he has been feeling pretty well. He denies having nosebleeds, hematochezia or hematuria. His swallowing is not improved and he continues using the PEG tube for his 6-7 cans of Osmolite. His abdomen has become more swollen again, but denies having abdominal pain. He continues doing PT for his right foot drop. His numbness in the soles of his feet and toes is stable, though he is using a cane to walk to aid his balance.  He will have a paracentesis on 10/7. He will have a swallow test on 10/18 [facility]  Overall, he feels ready for next cycle of chemo today.    REVIEW OF SYSTEMS:  Review of Systems  Constitutional: Positive for fatigue (25%).  HENT:   Positive for trouble swallowing.   Gastrointestinal: Negative for abdominal pain.  Musculoskeletal: Positive for arthralgias (R leg pain and cramps).  Neurological: Positive for numbness (feet; stable).  Psychiatric/Behavioral: Positive for sleep disturbance (d/t leg cramps).  All other  systems reviewed and are negative.   PAST MEDICAL/SURGICAL HISTORY:  Past Medical History:  Diagnosis Date  . 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[place]   . Anxiety   . Diabetes ([place]   . Heart attack ([place]   . Heart disease   . High cholesterol   . Hyperplasia of prostate   . Hypertension   . Low back pain   . Port-A-Cath in place 12/30/2019  . Tremor    Past Surgical History:  Procedure Laterality Date  . arterectomy     1993  . BIOPSY  12/01/2019   Procedure: BIOPSY;  Surgeon: RRogene Houston MD;  Location: [facility];  Service: Endoscopy;;  esophagus  . ESOPHAGOGASTRODUODENOSCOPY (EGD) WITH PROPOFOL N/A 12/01/2019   Procedure: ESOPHAGOGASTRODUODENOSCOPY (EGD) WITH PROPOFOL;  Surgeon: RRogene Houston MD;  Location: [facility] END[facility] SU[facility];  Service: Endoscopy;  Laterality: N/A;  . ESOPHAGOGASTRODUODENOSCOPY (EGD) WITH PROPOFOL N/A 12/24/2019   Procedure: ESOPHAGOGASTRODUODENOSCOPY (EGD) WITH PROPOFOL;  Surgeon: JAviva Signs MD;  Location: [facility] ORS;  Service: General;  Laterality: N/A;  . Heart stents     2002, 2004, 2006  . PEG PLACEMENT N/A 12/24/2019   Procedure: PERCUTANEOUS ENDOSCOPIC GASTROSTOMY (PEG) PLACEMENT;  Surgeon: JAviva Signs MD;  Location: [facility] ORS;  Service: General;  Laterality: N/A;  . PORTACATH PLACEMENT Left 12/24/2019   Procedure: INSERTION PORT-A-CATH;  Surgeon: JAviva Signs MD;  Location: [facility]  ORS;  Service: General;  Laterality: Left;    SOCIAL HISTORY:  Social History   Socioeconomic History  . Marital status: Married    Spouse name: Not on file  . Number of children: 2  . Years of education: 79  . Highest education level: Not on file  Occupational History  . Occupation: [employer]    Comment: Delivers medication  Tobacco Use  . Smoking status: Former Smoker    Packs/day: 1.00    Years: 50.00    Pack years: 50.00    Quit date: 12/21/2017    Years since quitting: 2.7  . Smokeless tobacco: Never Used  . Tobacco comment: Quit 2017    Vaping Use  . Vaping Use: Never used  Substance and Sexual Activity  . Alcohol use: Yes    Alcohol/week: 0.0 standard drinks    Comment: 3 drinks per week  . Drug use: No  . Sexual activity: Yes  Other Topics Concern  . Not on file  Social History Narrative   Lives at home with his wife.   Right-handed.   2 diet cokes per day.   Social Determinants of Health   Financial Resource Strain: Low Risk   . Difficulty of Paying Living Expenses: Not hard at all  Food Insecurity: No Food Insecurity  . Worried About Charity fundraiser in the Last Year: Never true  . Ran Out of Food in the Last Year: Never true  Transportation Needs: No Transportation Needs  . Lack of Transportation (Medical): No  . Lack of Transportation (Non-Medical): No  Physical Activity: Insufficiently Active  . Days of Exercise per Week: 2 days  . Minutes of Exercise per Session: 30 min  Stress: No Stress Concern Present  . Feeling of Stress : Only a little  Social Connections: Socially Integrated  . Frequency of Communication with Friends and Family: More than three times a week  . Frequency of Social Gatherings with Friends and Family: More than three times a week  . Attends Religious Services: 1 to 4 times per year  . Active Member of Clubs or Organizations: No  . Attends Archivist Meetings: 1 to 4 times per year  . Marital Status: Married  Human resources officer Violence: Not At Risk  . Fear of Current or Ex-Partner: No  . Emotionally Abused: No  . Physically Abused: No  . Sexually Abused: No    FAMILY HISTORY:  Family History  Problem Relation Age of Onset  . Alzheimer's disease Mother   . Diabetes Father   . Heart disease Father   . Stroke Father   . Heart disease Brother   . Colon cancer Brother   . Alzheimer's disease Sister     CURRENT MEDICATIONS:  Current Outpatient Medications  Medication Sig Dispense Refill  . ALPRAZolam (XANAX) 0.5 MG tablet Take 1 tablet (0.5 mg total) by  mouth 2 (two) times daily as needed for anxiety. 30 tablet 0  . aspirin EC 81 MG tablet Take 81 mg by mouth daily.     Marland Kitchen atorvastatin (LIPITOR) 80 MG tablet Take 80 mg by mouth every evening.     . ezetimibe (ZETIA) 10 MG tablet Take 10 mg by mouth daily.     . folic acid (FOLVITE) 419 MCG tablet Take 800 mcg by mouth daily.     . furosemide (LASIX) 20 MG tablet Take 1 tablet (20 mg total) by mouth daily. Do not take if the upper number of your BP is  less than 110. 30 tablet 2  . isosorbide mononitrate (IMDUR) 60 MG 24 hr tablet Take 60 mg by mouth daily.    Marland Kitchen losartan (COZAAR) 50 MG tablet Take 25 mg by mouth daily.     . metFORMIN (GLUCOPHAGE-XR) 500 MG 24 hr tablet Take 500 mg by mouth daily.     . metoprolol tartrate (LOPRESSOR) 25 MG tablet Take 0.5 tablets (12.5 mg total) by mouth 2 (two) times daily. 60 tablet 1  . Misc. Devices MISC Please provide osmolite 1.5.  2 cartons four times daily via feeding tube (8 daily total).  Flush with 38m water before and after administration of osmolite. 1 each 0  . Multiple Vitamin (MULTI-VITAMINS) TABS Take 1 tablet by mouth daily.     . Oxycodone HCl 10 MG TABS Take 1 tablet (10 mg total) by mouth at bedtime as needed. 30 tablet 0  . PACLitaxel (TAXOL IV) Inject into the vein.    . Ramucirumab (CYRAMZA IV) Inject into the vein.    . tamsulosin (FLOMAX) 0.4 MG CAPS capsule Take 0.4 mg by mouth daily.      No current facility-administered medications for this visit.    ALLERGIES:  No Known Allergies  PHYSICAL EXAM:  Performance status (ECOG): 1 - Symptomatic but completely ambulatory  Vitals:   09/08/20 0857  BP: 103/71  Pulse: (!) 103  Resp: 18  Temp: (!) 96.9 F (36.1 C)  SpO2: 97%   Wt Readings from Last 3 Encounters:  09/08/20 183 lb 3.2 oz (83.1 kg)  08/25/20 185 lb 1.6 oz (84 kg)  08/20/20 187 lb 9.8 oz (85.1 kg)   Physical Exam Vitals reviewed.  Constitutional:      Appearance: Normal appearance.  Cardiovascular:     Rate  and Rhythm: Normal rate and regular rhythm.     Pulses: Normal pulses.     Heart sounds: Normal heart sounds.  Pulmonary:     Effort: Pulmonary effort is normal.     Breath sounds: Normal breath sounds.  Chest:     Comments: Port-a-Cath in R chest Abdominal:     General: There is distension.     Palpations: Abdomen is soft.     Tenderness: There is no abdominal tenderness.     Comments: PEG tube  Musculoskeletal:     Right lower leg: No edema.     Left lower leg: No edema.  Neurological:     General: No focal deficit present.     Mental Status: He is alert and oriented to person, place, and time.     Motor: Weakness (R foot drop) present.  Psychiatric:        Mood and Affect: Mood normal.        Behavior: Behavior normal.     LABORATORY DATA:  I have reviewed the labs as listed.  CBC Latest Ref Rng & Units 09/08/2020 08/25/2020 08/18/2020  WBC 4.0 - 10.5 K/uL 4.9 3.9(L) 4.7  Hemoglobin 13.0 - 17.0 g/dL 12.1(L) 11.8(L) 10.8(L)  Hematocrit 39 - 52 % 38.1(L) 36.5(L) 33.2(L)  Platelets 150 - 400 K/uL 433(H) 390 402(H)   CMP Latest Ref Rng & Units 09/08/2020 08/25/2020 08/18/2020  Glucose 70 - 99 mg/dL 183(H) 221(H) 192(H)  BUN 8 - 23 mg/dL _0 Creatinine 0.61 - 1.24 mg/dL 0.79 0.73 0.66  Sodium 135 - 145 mmol/L 131(L) 136 133(L)  Potassium 3.5 - 5.1 mmol/L 4.2 3.9 4.1  Chloride 98 - 111 mmol/L 97(L) 101 99  CO2  22 - 32 mmol/L _0 Calcium 8.9 - 10.3 mg/dL 8.9 9.1 8.7(L)  Total Protein 6.5 - 8.1 g/dL 7.1 6.5 6.7  Total Bilirubin 0.3 - 1.2 mg/dL 0.4 0.5 0.2(L)  Alkaline Phos 38 - 126 U/L 59 56 55  AST 15 - 41 U/L 27 32 33  ALT 0 - 44 U/L 31 39 40    DIAGNOSTIC IMAGING:  I have independently reviewed the scans and discussed with the patient. US Paracentesis  Result Date: 08/11/2020 INDICATION: Malignant ascites, adenocarcinoma of the gastroesophageal junction EXAM: ULTRASOUND GUIDED THERAPEUTIC PARACENTESIS MEDICATIONS: None COMPLICATIONS: None immediate PROCEDURE:  Informed written consent was obtained from the patient after a discussion of the risks, benefits and alternatives to treatment. A timeout was performed prior to the initiation of the procedure. Initial ultrasound scanning demonstrates a large amount of ascites within the right lower abdominal quadrant. The right lower abdomen was prepped and draped in the usual sterile fashion. 1% lidocaine was used for local anesthesia. Following this, a 5 [place] Yueh catheter was introduced. An ultrasound image was saved for documentation purposes. The paracentesis was performed. The catheter was removed and a dressing was applied. The patient tolerated the procedure well without immediate post procedural complication. Patient received post-procedure intravenous albumin; see nursing notes for details. FINDINGS: A total of approximately 5.8 L of yellow fluid was removed. IMPRESSION: Successful ultrasound-guided paracentesis yielding 5.8 liters of peritoneal fluid. Electronically Signed   By: Lavonia Dana M.D.   On: 08/11/2020 12:52   [place] ASCITES (ABDOMEN LIMITED)  Result Date: 08/18/2020 CLINICAL DATA:  Adenocarcinoma of the gastroesophageal junction, recurrent ascites EXAM: LIMITED ABDOMEN ULTRASOUND FOR ASCITES TECHNIQUE: Limited ultrasound survey for ascites was performed in all four abdominal quadrants. COMPARISON:  08/11/2020 FINDINGS: No ascites identified upon survey imaging of the abdomen. Paracentesis not performed. IMPRESSION: No ascites identified. Electronically Signed   By: Lavonia Dana M.D.   On: 08/18/2020 11:04     ASSESSMENT:  1. 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 HER-2 FISH to be followed up. PD-L1 CPS-5%. -FOLFOX and Opdivo started on 12/31/2019. -PET scan on 03/22/2020 showed marked reduction in size and metabolic activity in the distal esophageal lesion, marked reduction in size of mediastinal adenopathy. Near complete resolution with only 2 small mildly metabolic  mediastinal lymph nodes remaining. Resolution of lymph nodes in the upper abdomen. Right hip bone lesion is still present but better. -CT CAP on 06/09/2020 showed interval resolution of previously demonstrated tree-in-bud nodularity in the lungs. Stable enlarged subcarinal lymph node and small residual lymph nodes with the gastrohepatic ligament and porta hepatis unchanged from the most recent PET scan. -CTAP on 08/03/2020 shows interval development of extensive omental caking particularly in the right upper quadrant with a more conglomerate soft tissue attenuation and large volume ascites. Irregular thickening in the distal esophagus incompletely assessed. Enlarging subcarinal and gastrohepatic nodes. -Paclitaxel and Cyramza started on 08/11/2020.  2.  Left vocal cord paralysis: -Status post left medialization laryngoplasty using Silastic implant done on 08/19/2020 at [facility].   PLAN:  1. Stage IV GE junction adenocarcinoma: -I have reviewed his labs which showed normal LFTs.  Albumin is low at 2.9.  White count and platelet count is adequate. -I will cut back on dose of paclitaxel by 20% because of neuropathy and balance issues. -He will proceed with cycle 2-day 1 paclitaxel and Cyramza. -RTC 2 weeks for follow-up.  2. Hoarseness of voice: -His hoarseness improved after  the procedure on 08/19/2020.  3.Weight loss: -Continue 6 cans of Osmolite.  Weight is decreased by 2 pounds.  4. Bone metastasis: -Continue monthly Zometa.  5. Diabetes: -Continue Metformin.  6. Hypertension: -Blood pressure is 103/71.  Continue to hold Lopressor.  7.Anxiety: -Continue Xanax as needed.  8. Ascites: -Last paracentesis on 08/11/2020.  There is clear reaccumulation. -We will schedule him for tomorrow.  9. Right foot drop: -He completed physical therapy.  He will be getting a brace soon.   Orders placed this encounter:  No orders of the defined types were placed in  this encounter.    Derek Jack, MD [place] [phone]   I, Milinda Antis, am acting as a scribe for Dr. Sanda Linger.  I, Derek Jack MD, have reviewed the above documentation for accuracy and completeness, and I agree with the above.

## 2020-09-09 ENCOUNTER — Encounter (HOSPITAL_COMMUNITY): Payer: Self-pay

## 2020-09-09 ENCOUNTER — Other Ambulatory Visit: Payer: Self-pay

## 2020-09-09 ENCOUNTER — Encounter (HOSPITAL_COMMUNITY): Payer: Medicare HMO | Admitting: Physical Therapy

## 2020-09-09 ENCOUNTER — Ambulatory Visit (HOSPITAL_COMMUNITY)
Admission: RE | Admit: 2020-09-09 | Discharge: 2020-09-09 | Disposition: A | Discharge status: Home / Self Care | Payer: Medicare HMO | Source: Ambulatory Visit | Attending: Hematology | Admitting: Hematology

## 2020-09-09 DIAGNOSIS — C160 Malignant neoplasm of cardia: Secondary | ICD-10-CM | POA: Diagnosis not present

## 2020-09-09 MED ORDER — LIDOCAINE HCL (PF) 2 % IJ SOLN
INTRAMUSCULAR | Status: AC
  Filled 2020-09-09: qty 10

## 2020-09-09 NOTE — Procedures (Signed)
PreOperative Dx: Malignant ascites Postoperative Dx: Malignant ascites Procedure:   US guided paracentesis Radiologist:  Thornton Papas Anesthesia:  10 ml of1% lidocaine Specimen:  2.2 L of yellow ascitic fluid EBL:   < 1 ml Complications: None

## 2020-09-09 NOTE — Sedation Documentation (Signed)
Patient tolerated paracentesis procedure well today and 2.2L of cloudy yellow pleural fluid removed. PT ambulatory at discharge with no acute distress noted. PT given discharge instructions and verbalized understanding.

## 2020-09-15 ENCOUNTER — Inpatient Hospital Stay (HOSPITAL_COMMUNITY): Payer: Medicare HMO

## 2020-09-15 ENCOUNTER — Encounter (HOSPITAL_COMMUNITY): Payer: Self-pay

## 2020-09-15 ENCOUNTER — Other Ambulatory Visit: Payer: Self-pay

## 2020-09-15 VITALS — BP 97/66 | HR 81 | Temp 97.5°F | Resp 18

## 2020-09-15 DIAGNOSIS — C160 Malignant neoplasm of cardia: Secondary | ICD-10-CM

## 2020-09-15 DIAGNOSIS — Z95828 Presence of other vascular implants and grafts: Secondary | ICD-10-CM

## 2020-09-15 DIAGNOSIS — Z5111 Encounter for antineoplastic chemotherapy: Secondary | ICD-10-CM | POA: Diagnosis not present

## 2020-09-15 LAB — CBC WITH DIFFERENTIAL/PLATELET
Abs Immature Granulocytes: 0.02 10*3/uL (ref 0.00–0.07)
Basophils Absolute: 0 10*3/uL (ref 0.0–0.1)
Basophils Relative: 1 %
Eosinophils Absolute: 0 10*3/uL (ref 0.0–0.5)
Eosinophils Relative: 1 %
HCT: 35.1 % — ABNORMAL LOW (ref 39.0–52.0)
Hemoglobin: 11.5 g/dL — ABNORMAL LOW (ref 13.0–17.0)
Immature Granulocytes: 0 %
Lymphocytes Relative: 26 %
Lymphs Abs: 1.2 10*3/uL (ref 0.7–4.0)
MCH: 29.6 pg (ref 26.0–34.0)
MCHC: 32.8 g/dL (ref 30.0–36.0)
MCV: 90.2 fL (ref 80.0–100.0)
Monocytes Absolute: 0.5 10*3/uL (ref 0.1–1.0)
Monocytes Relative: 12 %
Neutro Abs: 2.8 10*3/uL (ref 1.7–7.7)
Neutrophils Relative %: 60 %
Platelets: 425 10*3/uL — ABNORMAL HIGH (ref 150–400)
RBC: 3.89 MIL/uL — ABNORMAL LOW (ref 4.22–5.81)
RDW: 16 % — ABNORMAL HIGH (ref 11.5–15.5)
WBC: 4.7 10*3/uL (ref 4.0–10.5)
nRBC: 0 % (ref 0.0–0.2)

## 2020-09-15 LAB — COMPREHENSIVE METABOLIC PANEL
ALT: 35 U/L (ref 0–44)
AST: 27 U/L (ref 15–41)
Albumin: 2.7 g/dL — ABNORMAL LOW (ref 3.5–5.0)
Alkaline Phosphatase: 66 U/L (ref 38–126)
Anion gap: 12 (ref 5–15)
BUN: 23 mg/dL (ref 8–23)
CO2: 26 mmol/L (ref 22–32)
Calcium: 9.1 mg/dL (ref 8.9–10.3)
Chloride: 98 mmol/L (ref 98–111)
Creatinine, Ser: 0.74 mg/dL (ref 0.61–1.24)
GFR, Estimated: >60 mL/min (ref >60)
Glucose, Bld: 200 mg/dL — ABNORMAL HIGH (ref 70–99)
Potassium: 3.8 mmol/L (ref 3.5–5.1)
Sodium: 136 mmol/L (ref 135–145)
Total Bilirubin: 0.3 mg/dL (ref 0.3–1.2)
Total Protein: 6.6 g/dL (ref 6.5–8.1)

## 2020-09-15 MED ORDER — HEPARIN SOD (PORK) LOCK FLUSH 100 UNIT/ML IV SOLN
500.0000 [IU] | Freq: Once | INTRAVENOUS | Status: AC | PRN
  Administered 2020-09-15: 500 [IU]

## 2020-09-15 MED ORDER — SODIUM CHLORIDE 0.9 % IV SOLN
64.0000 mg/m2 | Freq: Once | INTRAVENOUS | Status: AC
  Administered 2020-09-15: 138 mg via INTRAVENOUS
  Filled 2020-09-15: qty 23

## 2020-09-15 MED ORDER — SODIUM CHLORIDE 0.9 % IV SOLN: Freq: Once | INTRAVENOUS | Status: AC

## 2020-09-15 MED ORDER — SODIUM CHLORIDE 0.9 % IV SOLN
20.0000 mg | Freq: Once | INTRAVENOUS | Status: AC
  Administered 2020-09-15: 20 mg via INTRAVENOUS
  Filled 2020-09-15: qty 20

## 2020-09-15 MED ORDER — FAMOTIDINE IN NACL 20-0.9 MG/50ML-% IV SOLN
20.0000 mg | Freq: Once | INTRAVENOUS | Status: AC
  Administered 2020-09-15: 20 mg via INTRAVENOUS
  Filled 2020-09-15: qty 50

## 2020-09-15 MED ORDER — DIPHENHYDRAMINE HCL 50 MG/ML IJ SOLN
50.0000 mg | Freq: Once | INTRAMUSCULAR | Status: AC
  Administered 2020-09-15: 50 mg via INTRAVENOUS
  Filled 2020-09-15: qty 1

## 2020-09-15 MED ORDER — SODIUM CHLORIDE 0.9% FLUSH
10.0000 mL | INTRAVENOUS | Status: DC | PRN
  Administered 2020-09-15 (×2): 10 mL

## 2020-09-15 NOTE — Patient Instructions (Signed)
[facility] Discharge Instructions for Patients Receiving Chemotherapy  Today you received the following chemotherapy agents   To help prevent nausea and vomiting after your treatment, we encourage you to take your nausea medication   If you develop nausea and vomiting that is not controlled by your nausea medication, call the clinic.   BELOW ARE SYMPTOMS THAT SHOULD BE REPORTED IMMEDIATELY:  *FEVER GREATER THAN 100.5 F  *CHILLS WITH OR WITHOUT FEVER  NAUSEA AND VOMITING THAT IS NOT CONTROLLED WITH YOUR NAUSEA MEDICATION  *UNUSUAL SHORTNESS OF BREATH  *UNUSUAL BRUISING OR BLEEDING  TENDERNESS IN MOUTH AND THROAT WITH OR WITHOUT PRESENCE OF ULCERS  *URINARY PROBLEMS  *BOWEL PROBLEMS  UNUSUAL RASH Items with * indicate a potential emergency and should be followed up as soon as possible.  Feel free to call the clinic should you have any questions or concerns. The clinic phone number is [phone].  Please show the CHEMO ALERT CARD at check-in to the Emergency Department and triage nurse.   

## 2020-09-15 NOTE — Progress Notes (Signed)
Pt here for D8C2 of taxol.  Pt is having trouble swallowing.  Pt is taking in about 3 bottles of high calorie boost a day.  Pt has lost weight since last treatment.  Pt does not complain of pain only tenderness in abdomen.  Pt did have a fall in the elevator prior to arriving to cancer center.   Labs within parameter for treatment. Okay for treatment.   Pt will have paracentesis tomorrow at 1pm.  Pt is aware of appt.   Tolerated treatment well today without incidence.  Vital signs stable prior to discharge.  Discharged in stable condition via wheelchair.

## 2020-09-16 ENCOUNTER — Other Ambulatory Visit (HOSPITAL_COMMUNITY): Payer: Self-pay | Admitting: *Deleted

## 2020-09-16 ENCOUNTER — Ambulatory Visit (HOSPITAL_COMMUNITY)
Admission: RE | Admit: 2020-09-16 | Discharge: 2020-09-16 | Disposition: A | Discharge status: Home / Self Care | Payer: Medicare HMO | Source: Ambulatory Visit | Attending: Hematology | Admitting: Hematology

## 2020-09-16 ENCOUNTER — Encounter (HOSPITAL_COMMUNITY): Payer: Self-pay

## 2020-09-16 ENCOUNTER — Other Ambulatory Visit (HOSPITAL_COMMUNITY): Payer: Self-pay | Admitting: Hematology

## 2020-09-16 DIAGNOSIS — R180 Malignant ascites: Secondary | ICD-10-CM

## 2020-09-16 DIAGNOSIS — C160 Malignant neoplasm of cardia: Secondary | ICD-10-CM

## 2020-09-16 NOTE — Progress Notes (Signed)
Orders placed for right inguinal ultrasound per Dr. Delton Coombes.

## 2020-09-16 NOTE — Progress Notes (Signed)
Not enough fluid present for paracentesis today, changed to Abd Limited per RO. PT requested to be taken to the cancer center to speak with nursing/oncologist and sat in the cancer center waiting area at this time.

## 2020-09-17 ENCOUNTER — Ambulatory Visit (HOSPITAL_COMMUNITY): Payer: Medicare HMO

## 2020-09-17 NOTE — Progress Notes (Signed)
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  Patient with disease progression.  Patient receiving paclitaxel and cyramza.  Noted paracentesis on 10/7 with 2.2 L fluid removed. Noted swallow test on 10/18 at [facility].   Spoke with patient via phone for nutrition follow-up. Patient has been giving 3-4 cartons of Boost VHC oral shake via tube feeding.  Previously was using osmolite 1.5 about 6 cartons per day.  Reports that stomach has shrunk and having a hard time handling volume of feedings, feels full. Taking sips of liquids via mouth.  Hopeful that he will be able to take more by mouth following 10/18 appointment [facility] for swallow test and f/u with surgeon.      Medications: reviewed  Labs: reviewed  Anthropometrics:   Weight 176 lb 6.4 oz on 10/13 decreased from 185 lb 1.6 oz on 9/22   Estimated Energy Needs  Kcals: 8185-6314 Protein: 120-140 g Fluid: 2.4 L  NUTRITION DIAGNOSIS: Inadequate ora intake continues   INTERVENTION:  Discussed with patient switching to higher calorie formula (TwoCal, [employer] 1.4, Nutren 2.0) could be options vs osmolite 1.5.  Also discussed continuous pump feeding vs bolus feeding.  Patient wants to think about options and see what is determined on 10/18 at [facility] before making decision.   Samples of [place] 1.4 formula left at front desk for patient to pick up on 10/20, next appointment to try.   Trial of reglan maybe an option for patient to try. Patient has contact information    MONITORING, EVALUATION, GOAL: weight trends, tube feeding, intake   NEXT VISIT: October 29 phone f/u  Timothy Oconnor, [place], [place] Registered Dietitian [phone] (mobile)

## 2020-09-20 ENCOUNTER — Other Ambulatory Visit (HOSPITAL_COMMUNITY): Payer: Self-pay

## 2020-09-20 MED ORDER — FUROSEMIDE 40 MG PO TABS: 40.0000 mg | ORAL_TABLET | Freq: Every day | ORAL | 3 refills | Status: AC | PRN

## 2020-09-22 ENCOUNTER — Telehealth: Payer: Self-pay

## 2020-09-22 ENCOUNTER — Other Ambulatory Visit: Payer: Self-pay

## 2020-09-22 ENCOUNTER — Encounter (HOSPITAL_COMMUNITY): Payer: Self-pay | Admitting: Emergency Medicine

## 2020-09-22 ENCOUNTER — Emergency Department (HOSPITAL_COMMUNITY): Payer: Medicare HMO

## 2020-09-22 ENCOUNTER — Other Ambulatory Visit (HOSPITAL_COMMUNITY): Payer: Medicare HMO

## 2020-09-22 ENCOUNTER — Ambulatory Visit (HOSPITAL_COMMUNITY): Payer: Medicare HMO

## 2020-09-22 ENCOUNTER — Ambulatory Visit (HOSPITAL_COMMUNITY): Payer: Medicare HMO | Admitting: Hematology

## 2020-09-22 ENCOUNTER — Inpatient Hospital Stay (HOSPITAL_COMMUNITY)
Admission: EM | Admit: 2020-09-22 | Discharge: 2020-09-24 | DRG: 375 | Disposition: A | Discharge status: Home / Self Care | Payer: Medicare HMO | Attending: Internal Medicine | Admitting: Internal Medicine

## 2020-09-22 ENCOUNTER — Inpatient Hospital Stay (HOSPITAL_COMMUNITY): Payer: Medicare HMO

## 2020-09-22 DIAGNOSIS — R54 Age-related physical debility: Secondary | ICD-10-CM | POA: Diagnosis present

## 2020-09-22 DIAGNOSIS — Z79899 Other long term (current) drug therapy: Secondary | ICD-10-CM | POA: Diagnosis not present

## 2020-09-22 DIAGNOSIS — Z7984 Long term (current) use of oral hypoglycemic drugs: Secondary | ICD-10-CM

## 2020-09-22 DIAGNOSIS — E119 Type 2 diabetes mellitus without complications: Secondary | ICD-10-CM | POA: Diagnosis present

## 2020-09-22 DIAGNOSIS — I2510 Atherosclerotic heart disease of native coronary artery without angina pectoris: Secondary | ICD-10-CM | POA: Diagnosis present

## 2020-09-22 DIAGNOSIS — C159 Malignant neoplasm of esophagus, unspecified: Secondary | ICD-10-CM

## 2020-09-22 DIAGNOSIS — Z7982 Long term (current) use of aspirin: Secondary | ICD-10-CM | POA: Diagnosis not present

## 2020-09-22 DIAGNOSIS — Z825 Family history of asthma and other chronic lower respiratory diseases: Secondary | ICD-10-CM

## 2020-09-22 DIAGNOSIS — C7951 Secondary malignant neoplasm of bone: Secondary | ICD-10-CM | POA: Diagnosis present

## 2020-09-22 DIAGNOSIS — I252 Old myocardial infarction: Secondary | ICD-10-CM

## 2020-09-22 DIAGNOSIS — F419 Anxiety disorder, unspecified: Secondary | ICD-10-CM | POA: Diagnosis present

## 2020-09-22 DIAGNOSIS — Z87891 Personal history of nicotine dependence: Secondary | ICD-10-CM

## 2020-09-22 DIAGNOSIS — C160 Malignant neoplasm of cardia: Secondary | ICD-10-CM | POA: Diagnosis not present

## 2020-09-22 DIAGNOSIS — E785 Hyperlipidemia, unspecified: Secondary | ICD-10-CM | POA: Diagnosis present

## 2020-09-22 DIAGNOSIS — Z955 Presence of coronary angioplasty implant and graft: Secondary | ICD-10-CM | POA: Diagnosis not present

## 2020-09-22 DIAGNOSIS — Z820 Family history of epilepsy and other diseases of the nervous system: Secondary | ICD-10-CM

## 2020-09-22 DIAGNOSIS — E440 Moderate protein-calorie malnutrition: Secondary | ICD-10-CM | POA: Insufficient documentation

## 2020-09-22 DIAGNOSIS — R180 Malignant ascites: Secondary | ICD-10-CM | POA: Diagnosis present

## 2020-09-22 DIAGNOSIS — K920 Hematemesis: Secondary | ICD-10-CM | POA: Diagnosis not present

## 2020-09-22 DIAGNOSIS — R042 Hemoptysis: Secondary | ICD-10-CM | POA: Diagnosis present

## 2020-09-22 DIAGNOSIS — K4090 Unilateral inguinal hernia, without obstruction or gangrene, not specified as recurrent: Secondary | ICD-10-CM | POA: Diagnosis present

## 2020-09-22 DIAGNOSIS — K2270 Barrett's esophagus without dysplasia: Secondary | ICD-10-CM | POA: Diagnosis present

## 2020-09-22 DIAGNOSIS — Z20822 Contact with and (suspected) exposure to covid-19: Secondary | ICD-10-CM | POA: Diagnosis present

## 2020-09-22 DIAGNOSIS — Z931 Gastrostomy status: Secondary | ICD-10-CM | POA: Diagnosis not present

## 2020-09-22 DIAGNOSIS — J90 Pleural effusion, not elsewhere classified: Secondary | ICD-10-CM | POA: Diagnosis present

## 2020-09-22 DIAGNOSIS — Z823 Family history of stroke: Secondary | ICD-10-CM

## 2020-09-22 DIAGNOSIS — Z800 Family history of malignant neoplasm of digestive organs: Secondary | ICD-10-CM

## 2020-09-22 DIAGNOSIS — Z8249 Family history of ischemic heart disease and other diseases of the circulatory system: Secondary | ICD-10-CM

## 2020-09-22 DIAGNOSIS — Z833 Family history of diabetes mellitus: Secondary | ICD-10-CM

## 2020-09-22 DIAGNOSIS — I10 Essential (primary) hypertension: Secondary | ICD-10-CM | POA: Diagnosis present

## 2020-09-22 DIAGNOSIS — D649 Anemia, unspecified: Secondary | ICD-10-CM | POA: Diagnosis present

## 2020-09-22 LAB — CBC WITH DIFFERENTIAL/PLATELET
Abs Immature Granulocytes: 0.03 10*3/uL (ref 0.00–0.07)
Basophils Absolute: 0 10*3/uL (ref 0.0–0.1)
Basophils Relative: 1 %
Eosinophils Absolute: 0 10*3/uL (ref 0.0–0.5)
Eosinophils Relative: 0 %
HCT: 32 % — ABNORMAL LOW (ref 39.0–52.0)
Hemoglobin: 10.4 g/dL — ABNORMAL LOW (ref 13.0–17.0)
Immature Granulocytes: 1 %
Lymphocytes Relative: 22 %
Lymphs Abs: 1.1 10*3/uL (ref 0.7–4.0)
MCH: 29.5 pg (ref 26.0–34.0)
MCHC: 32.5 g/dL (ref 30.0–36.0)
MCV: 90.9 fL (ref 80.0–100.0)
Monocytes Absolute: 0.4 10*3/uL (ref 0.1–1.0)
Monocytes Relative: 9 %
Neutro Abs: 3.3 10*3/uL (ref 1.7–7.7)
Neutrophils Relative %: 67 %
Platelets: 456 10*3/uL — ABNORMAL HIGH (ref 150–400)
RBC: 3.52 MIL/uL — ABNORMAL LOW (ref 4.22–5.81)
RDW: 16.7 % — ABNORMAL HIGH (ref 11.5–15.5)
WBC: 5 10*3/uL (ref 4.0–10.5)
nRBC: 0 % (ref 0.0–0.2)

## 2020-09-22 LAB — COMPREHENSIVE METABOLIC PANEL
ALT: 38 U/L (ref 0–44)
AST: 34 U/L (ref 15–41)
Albumin: 2.7 g/dL — ABNORMAL LOW (ref 3.5–5.0)
Alkaline Phosphatase: 68 U/L (ref 38–126)
Anion gap: 13 (ref 5–15)
BUN: 23 mg/dL (ref 8–23)
CO2: 25 mmol/L (ref 22–32)
Calcium: 8.7 mg/dL — ABNORMAL LOW (ref 8.9–10.3)
Chloride: 99 mmol/L (ref 98–111)
Creatinine, Ser: 0.68 mg/dL (ref 0.61–1.24)
GFR, Estimated: >60 mL/min (ref >60)
Glucose, Bld: 223 mg/dL — ABNORMAL HIGH (ref 70–99)
Potassium: 3.4 mmol/L — ABNORMAL LOW (ref 3.5–5.1)
Sodium: 137 mmol/L (ref 135–145)
Total Bilirubin: 0.4 mg/dL (ref 0.3–1.2)
Total Protein: 6.6 g/dL (ref 6.5–8.1)

## 2020-09-22 LAB — RESPIRATORY PANEL BY RT PCR (FLU A&B, COVID)
Influenza A by PCR: NEGATIVE
Influenza B by PCR: NEGATIVE
SARS Coronavirus 2 by RT PCR: NEGATIVE

## 2020-09-22 LAB — PROTIME-INR
INR: 1 (ref 0.8–1.2)
Prothrombin Time: 13.1 seconds (ref 11.4–15.2)

## 2020-09-22 LAB — CBG MONITORING, ED: Glucose-Capillary: 118 mg/dL — ABNORMAL HIGH (ref 70–99)

## 2020-09-22 LAB — TROPONIN I (HIGH SENSITIVITY)
Troponin I (High Sensitivity): 9 ng/L (ref <18)
Troponin I (High Sensitivity): 12 ng/L (ref <18)

## 2020-09-22 MED ORDER — ACETAMINOPHEN 325 MG PO TABS
650.0000 mg | ORAL_TABLET | Freq: Four times a day (QID) | ORAL | Status: DC | PRN
  Administered 2020-09-24: 650 mg via ORAL
  Filled 2020-09-22: qty 2

## 2020-09-22 MED ORDER — PANTOPRAZOLE SODIUM 40 MG IV SOLR
40.0000 mg | Freq: Two times a day (BID) | INTRAVENOUS | Status: DC
  Administered 2020-09-23 – 2020-09-24 (×2): 40 mg via INTRAVENOUS
  Filled 2020-09-22 (×2): qty 40

## 2020-09-22 MED ORDER — ONDANSETRON HCL 4 MG/2ML IJ SOLN: 4.0000 mg | Freq: Four times a day (QID) | INTRAMUSCULAR | Status: DC | PRN

## 2020-09-22 MED ORDER — ONDANSETRON HCL 4 MG/2ML IJ SOLN
4.0000 mg | Freq: Once | INTRAMUSCULAR | Status: AC
  Administered 2020-09-22: 4 mg via INTRAVENOUS
  Filled 2020-09-22: qty 2

## 2020-09-22 MED ORDER — ALPRAZOLAM 0.5 MG PO TABS
0.5000 mg | ORAL_TABLET | Freq: Two times a day (BID) | ORAL | Status: DC | PRN
  Administered 2020-09-23: 0.5 mg via ORAL
  Filled 2020-09-22: qty 1

## 2020-09-22 MED ORDER — SODIUM CHLORIDE 0.9 % IV BOLUS
1000.0000 mL | Freq: Once | INTRAVENOUS | Status: AC
  Administered 2020-09-22: 1000 mL via INTRAVENOUS

## 2020-09-22 MED ORDER — ONDANSETRON HCL 4 MG PO TABS: 4.0000 mg | ORAL_TABLET | Freq: Four times a day (QID) | ORAL | Status: DC | PRN

## 2020-09-22 MED ORDER — ACETAMINOPHEN 650 MG RE SUPP: 650.0000 mg | Freq: Four times a day (QID) | RECTAL | Status: DC | PRN

## 2020-09-22 MED ORDER — ATORVASTATIN CALCIUM 80 MG PO TABS
80.0000 mg | ORAL_TABLET | Freq: Every evening | ORAL | Status: DC
  Administered 2020-09-23: 80 mg via ORAL
  Filled 2020-09-22: qty 1

## 2020-09-22 MED ORDER — INSULIN ASPART 100 UNIT/ML ~~LOC~~ SOLN: 0.0000 [IU] | Freq: Three times a day (TID) | SUBCUTANEOUS | Status: DC

## 2020-09-22 MED ORDER — PANTOPRAZOLE SODIUM 40 MG IV SOLR
40.0000 mg | Freq: Once | INTRAVENOUS | Status: AC
  Administered 2020-09-22: 40 mg via INTRAVENOUS
  Filled 2020-09-22: qty 40

## 2020-09-22 MED ORDER — OXYCODONE HCL 5 MG PO TABS: 10.0000 mg | ORAL_TABLET | Freq: Every evening | ORAL | Status: DC | PRN

## 2020-09-22 MED ORDER — TAMSULOSIN HCL 0.4 MG PO CAPS
0.4000 mg | ORAL_CAPSULE | Freq: Every day | ORAL | Status: DC
  Administered 2020-09-24: 0.4 mg via ORAL
  Filled 2020-09-22: qty 1

## 2020-09-22 MED ORDER — EZETIMIBE 10 MG PO TABS
10.0000 mg | ORAL_TABLET | Freq: Every day | ORAL | Status: DC
  Administered 2020-09-24: 10 mg via ORAL
  Filled 2020-09-22: qty 1

## 2020-09-22 NOTE — Consult Note (Addendum)
[facility] Consult: 3:40 PM 09/22/2020  LOS: 0 days    Referring Provider: Dr Benjamine Mola ENT  Primary Care Physician:  [name], MD Primary Gastroenterologist:  Dr. Laural Golden  Oncologist: DR Delton Coombes.      Reason for Consultation:  Hemoptysis, hematemesis.     HPI: Timothy Oconnor is a 69 y.o. male.  [place] STEMI 2018.  PCI, DES 2018. NSTEMI w EF 45% 12/2019.  Htn. Hld.    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   11/2019 EGD: For dysphagia and abnormal CT.  Food in mid esophagus, proximal to malignant appearing stricture.  Food removed.  Stricture biopsied.  4 cm HH.  Normal stomach and examined duodenum.  Pathology confirmed adenocarcinoma in background of Barrett's esophagus 12/27/2019 MRI of pelvis confirmed right hip intertrochanteric mets 03/22/2020 PET scan showed reduced size and metabolic activity in the distal esophageal lesion.  Decreased activity of mediastinal lymphadenopathy.  Resolution of metastatic nodal mets in upper abdomen and no new or progressive disease.  No liver mets.  08/03/2020 CTAP w contrast: Extensive omental caking especially in RUQ.  Conglomerate soft tissue attenuation within fat-containing umbilical hernia.  Large volume, likely malignant, ascites.  Percutaneous G-tube in place.  Irregular, thickened distal thoracic esophagus, incompletely assessed in setting of under distention and lack of luminal contrast.  Enlarging subcarinal and gastrohepatic nodes concerning for mets.  Bilateral pleural effusions.  None specific, mild, circumferential urinary bladder wall thickening.  Nonspecific heterogeneity of prostate.  Aortic atherosclerosis. Paracentesis 08/04/2020, 3.75 L fluid removed.  Cytology confirmed malignant ascites Paracentesis 08/11/2020, 5.8 L fluid removed Paracentesis 09/09/2020, 2.2 L  paracentesis.  Gastric feeding tube placed in 12/2019.Marland Kitchen   Chemo pursued, includes    - FOLFOX and nivolumab x 14 cycles from 12/31/2019 to 07/26/2020   - current: Paclitaxel 3 weeks on, 1 week off; ramucirumab.  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     08/19/20 laryngoplasty medialization. Pt's wofe refers to this as vocal cord implant.   This past Monday patient had a swallow study performed at the ENT office which showed that he was unable to swallow.  He has had swallowing problems dating back to July.  He has trouble handling his secretions and is often spitting out clear material.  Has been relying for nutrition on feeding tube placed in January. This morning in the parking lot at the oncologist in [place] he developed acute, new onset of spitting up blood.  He has had at least 3 or 4 episodes so far.  No nausea.  No abdominal pain, no syncope.  Hgb 10.4 , was 10.8 to 12.1 from mid September to 10/13, 1 week ago.    ENT MD Dr. Benjamine Mola performed bedside fiberoptic, flexible laryngoscopy at bedside about an hour ago.  Saw small amount of blood around the esophageal inlet, left vocal cord paralyzed.  No indication of ENT bleeding and Dr. Benjamine Mola suspects esophageal source of the hematemesis  Patient takes low-dose aspirin but no other  platelet or anticoagulation medication.  Patient fell down about a week ago at which point he felt like a painless hernia arose in his right groin.  Previous umbilical hernia has not been a problem lately.  Retired Art therapist.    Past Medical History:  Diagnosis Date  . 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 Anxiety   . Diabetes ([place])   . Heart attack ([place])   . Heart disease   . High cholesterol   . Hyperplasia of prostate   . Hypertension   . Low back pain   . Port-A-Cath in place 12/30/2019  . Tremor     Past Surgical History:  Procedure Laterality Date  .  arterectomy     1993  . BIOPSY  12/01/2019   Procedure: BIOPSY;  Surgeon: Rogene Houston, MD;  Location: [facility];  Service: Endoscopy;;  esophagus  . ESOPHAGOGASTRODUODENOSCOPY (EGD) WITH PROPOFOL N/A 12/01/2019   Procedure: ESOPHAGOGASTRODUODENOSCOPY (EGD) WITH PROPOFOL;  Surgeon: Rogene Houston, MD;  Location: [facility];  Service: Endoscopy;  Laterality: N/A;  . ESOPHAGOGASTRODUODENOSCOPY (EGD) WITH PROPOFOL N/A 12/24/2019   Procedure: ESOPHAGOGASTRODUODENOSCOPY (EGD) WITH PROPOFOL;  Surgeon: Aviva Signs, MD;  Location: [facility] ORS;  Service: General;  Laterality: N/A;  . Heart stents     2002, 2004, 2006  . PEG PLACEMENT N/A 12/24/2019   Procedure: PERCUTANEOUS ENDOSCOPIC GASTROSTOMY (PEG) PLACEMENT;  Surgeon: Aviva Signs, MD;  Location: [facility];  Service: General;  Laterality: N/A;  . PORTACATH PLACEMENT Left 12/24/2019   Procedure: INSERTION PORT-A-CATH;  Surgeon: Aviva Signs, MD;  Location: [facility];  Service: General;  Laterality: Left;    Prior to Admission medications   Medication Sig Start Date End Date Taking? Authorizing Provider  ALPRAZolam Duanne Moron) 0.5 MG tablet Take 1 tablet (0.5 mg total) by mouth 2 (two) times daily as needed for anxiety. 09/08/20   Derek Jack, MD  aspirin EC 81 MG tablet Take 81 mg by mouth daily.     [provider]  atorvastatin (LIPITOR) 80 MG tablet Take 80 mg by mouth every evening.  12/25/11   [provider]  ezetimibe (ZETIA) 10 MG tablet Take 10 mg by mouth daily.  12/25/11   [provider]  folic acid (FOLVITE) 149 MCG tablet Take 800 mcg by mouth daily.     [provider]  furosemide (LASIX) 40 MG tablet Take 1 tablet (40 mg total) by mouth daily as needed for fluid or edema. 09/20/20   Derek Jack, MD  isosorbide mononitrate (IMDUR) 60 MG 24 hr tablet Take 60 mg by mouth daily. 12/16/19   [provider]  losartan (COZAAR) 50 MG tablet Take 25 mg by mouth daily.  11/17/19    [provider]  metFORMIN (GLUCOPHAGE-XR) 500 MG 24 hr tablet Take 500 mg by mouth daily.  04/26/20   [provider]  metoprolol tartrate (LOPRESSOR) 25 MG tablet Take 0.5 tablets (12.5 mg total) by mouth 2 (two) times daily. 12/03/19   Orson Eva, MD  Misc. Devices MISC Please provide osmolite 1.5.  2 cartons four times daily via feeding tube (8 daily total).  Flush with 72ml water before and after administration of osmolite. 07/26/20   Derek Jack, MD  Multiple Vitamin (MULTI-VITAMINS) TABS Take 1 tablet by mouth daily.     [provider]  Oxycodone HCl 10 MG TABS Take 1 tablet (10 mg total) by mouth at bedtime as needed. 06/28/20   Derek Jack, MD  PACLitaxel (  TAXOL IV) Inject into the vein.    [provider]  Ramucirumab (CYRAMZA IV) Inject into the vein.    [provider]  tamsulosin (FLOMAX) 0.4 MG CAPS capsule Take 0.4 mg by mouth daily.  12/02/11   [provider]  prochlorperazine (COMPAZINE) 10 MG tablet Take 1 tablet (10 mg total) by mouth every 6 (six) hours as needed (Nausea or vomiting). Patient not taking: Reported on 08/04/2020 12/30/19 08/04/20  Derek Jack, MD    Scheduled Meds:  Infusions:  PRN Meds:    Allergies as of 09/22/2020  . (No Known Allergies)    Family History  Problem Relation Age of Onset  . Alzheimer's disease Mother   . Diabetes Father   . Heart disease Father   . Stroke Father   . Heart disease Brother   . Colon cancer Brother   . Alzheimer's disease Sister     Social History   Socioeconomic History  . Marital status: Married    Spouse name: Not on file  . Number of children: 2  . Years of education: 63  . Highest education level: Not on file  Occupational History  . Occupation: [employer]    Comment: Delivers medication  Tobacco Use  . Smoking status: Former Smoker    Packs/day: 1.00    Years: 50.00    Pack years: 50.00    Quit date: 12/21/2017    Years  since quitting: 2.7  . Smokeless tobacco: Never Used  . Tobacco comment: Quit 2017  Vaping Use  . Vaping Use: Never used  Substance and Sexual Activity  . Alcohol use: Yes    Alcohol/week: 0.0 standard drinks    Comment: 3 drinks per week  . Drug use: No  . Sexual activity: Yes  Other Topics Concern  . Not on file  Social History Narrative   Lives at home with his wife.   Right-handed.   2 diet cokes per day.   Social Determinants of Health   Financial Resource Strain: Low Risk   . Difficulty of Paying Living Expenses: Not hard at all  Food Insecurity: No Food Insecurity  . Worried About Charity fundraiser in the Last Year: Never true  . Ran Out of Food in the Last Year: Never true  Transportation Needs: No Transportation Needs  . Lack of Transportation (Medical): No  . Lack of Transportation (Non-Medical): No  Physical Activity: Insufficiently Active  . Days of Exercise per Week: 2 days  . Minutes of Exercise per Session: 30 min  Stress: No Stress Concern Present  . Feeling of Stress : Only a little  Social Connections: Socially Integrated  . Frequency of Communication with Friends and Family: More than three times a week  . Frequency of Social Gatherings with Friends and Family: More than three times a week  . Attends Religious Services: 1 to 4 times per year  . Active Member of Clubs or Organizations: No  . Attends Archivist Meetings: 1 to 4 times per year  . Marital Status: Married  Human resources officer Violence: Not At Risk  . Fear of Current or Ex-Partner: No  . Emotionally Abused: No  . Physically Abused: No  . Sexually Abused: No    REVIEW OF SYSTEMS: Constitutional: Some fatigue and weakness.  Not profound. ENT:  No nose bleeds Pulm: Coughs a lot productive of clear sputum CV:  No palpitations, no LE edema.  GU: Scrotal edema.  No hematuria, no frequency GI: See  HPI Heme: No unusual bleeding or bruising. Transfusions: None Neuro:  No headaches,  no peripheral tingling or numbness.  No syncope. Derm:  No itching, no rash or sores.  Endocrine:  No sweats or chills.  No polyuria or dysuria Immunization: Not queried   PHYSICAL EXAM: Vital signs in last 24 hours: Vitals:   09/22/20 1347 09/22/20 1533  BP:  108/78  Pulse:  96  Resp:  (!) 24  Temp: (!) 97.5 F (36.4 C)   SpO2:  93%   Wt Readings from Last 3 Encounters:  09/22/20 77.6 kg  09/15/20 80 kg  09/08/20 83.1 kg    General: Pale, looks unwell/chronically ill.  Alert, comfortable. Head: No facial asymmetry or swelling.  No signs of head trauma. Eyes: No scleral icterus.  No conjunctival pallor.  EOMI. Ears: Not hard of hearing Nose: No discharge or congestion Mouth: Moist, pink oral mucosa.  Staining with red blood on the tongue but no lesions visible.   Clearing oral secretions that are bloody. Neck: No JVD, no masses, no thyromegaly. Lungs: Diminished breath sounds especially on the right lung base.  Mucoid cough.   Heart: RRR.  No MRG.  S1, S2 present Abdomen: Soft without tenderness.  Active bowel sounds.  PEG site benign on left mid abdomen.  Do not appreciate inguinal hernia..  GU: Scrotal edema. Rectal: Deferred Musc/Skeltl: No joint redness, swelling or gross deformity. Extremities: No CCE. Neurologic: No CCE. Skin: No rash, no sores, no telangiectasia. Tattoos: None observed Nodes: No cervical adenopathy Psych: Cooperative, pleasant, calm, fluid speech.  Intake/Output from previous day: No intake/output data recorded. Intake/Output this shift: Total I/O In: 1000 [IV Piggyback:1000] Out: -   LAB RESULTS: Recent Labs    09/22/20 0925  WBC 5.0  HGB 10.4*  HCT 32.0*  PLT 456*   BMET Lab Results  Component Value Date   NA 137 09/22/2020   NA 136 09/15/2020   NA 131 (L) 09/08/2020   K 3.4 (L) 09/22/2020   K 3.8 09/15/2020   K 4.2 09/08/2020   CL 99 09/22/2020   CL 98 09/15/2020   CL 97 (L) 09/08/2020   CO2 25 09/22/2020   CO2 26  09/15/2020   CO2 23 09/08/2020   GLUCOSE 223 (H) 09/22/2020   GLUCOSE 200 (H) 09/15/2020   GLUCOSE 183 (H) 09/08/2020   BUN 23 09/22/2020   BUN 23 09/15/2020   BUN 17 09/08/2020   CREATININE 0.68 09/22/2020   CREATININE 0.74 09/15/2020   CREATININE 0.79 09/08/2020   CALCIUM 8.7 (L) 09/22/2020   CALCIUM 9.1 09/15/2020   CALCIUM 8.9 09/08/2020   LFT Recent Labs    09/22/20 0925  PROT 6.6  ALBUMIN 2.7*  AST 34  ALT 38  ALKPHOS 68  BILITOT 0.4   PT/INR Lab Results  Component Value Date   INR 1.0 09/22/2020   INR 1.1 12/02/2019   Hepatitis Panel No results for input(s): HEPBSAG, HCVAB, HEPAIGM, HEPBIGM in the last 72 hours. C-Diff No components found for: CDIFF Lipase     Component Value Date/Time   LIPASE 31 08/03/2020 1707    Drugs of Abuse  No results found for: LABOPIA, COCAINSCRNUR, LABBENZ, AMPHETMU, THCU, LABBARB   RADIOLOGY STUDIES: DG Chest 1 View  Result Date: 09/22/2020 CLINICAL DATA:  Hematemesis.  History of stomach cancer EXAM: CHEST  1 VIEW COMPARISON:  Chest CT 06/09/2020 FINDINGS: Right pleural effusion which could be small or moderate. There is presumed right lower lobe opacity. Left subclavian porta  catheter with tip at the SVC. Normal heart size. IMPRESSION: Right pleural effusion/lower lobe opacity, new from July 2021. Electronically Signed   By: Monte Fantasia M.D.   On: 09/22/2020 09:43     IMPRESSION:   *   Hematemesis, hemoptysis inpatient undergoing chemotherapy for metastatic esophageal cancer. Recent procedure to address vocal cord paralysis last month.  By today's flexible laryngoscopy, there is no ENT source of the bleeding.  *     Normocytic anemia.   Current Hb 10.4 compares with a range between 10.8 and 12.1 within the last month.  *    Malignant ascites.  Paracentesis x3 between September 1 and October 7.  *   Gastrostomy feeding tube in place.  Dysphagia.      PLAN:     *   EGD likely.   Placed orders in the [facility] for  upper endoscopy but Dr. Loletha Carrow will confirm proceeding with EGD.  *    Start Protonix 40 mg IV bid.  Follow Hb   Azucena Freed  09/22/2020, 3:40 PM Phone [phone]  I have reviewed the entire case in detail with the above APP. Therefore, I agree with the diagnoses recorded above. In addition,  I have personally interviewed and examined the patient and have personally reviewed any abdominal/pelvic CT scan images. The patient's wife and the internal medicine resident admitting team where there when I saw the patient.  My additional thoughts are as follows: Unfortunate man with advanced and progressing metastatic esophageal cancer.  Gets his nutrition from the G-tube, and has developed malignant ascites requiring paracentesis.  What we are seeing today is possible hematemesis but I think this might still be hemoptysis.  I have reviewed the ENT physicians findings, but having observe the patient for a while, he is coughing and "Hocking" up bright red blood.  That is not how I would expect esophageal tumor bleeding to present.  He has not had any blood from his G-tube by report nor blood from the PEG site.  I initially offer the patient an upper endoscopy to investigate the possibility of esophageal tumor bleeding.  He and his wife understand that even if I were to do that and discover an actively bleeding tumor, there is no endoscopic therapy I can apply to that.  It is possible but much less likely has a discrete upper GI focus of bleeding such as a stomach ulcer, perhaps related to his internal PEG balloon/bumper.  External PEG site certainly looks healthy. He and his wife feel that even if we were to do the endoscopy and that with the finding, that would still help him make a decision about his care, and possibly transitioning to hospice.  I spoken with the resident team and amended my plan after evaluating the patient as noted above.  I recommended a chest CT to see about the possibility of a  pulmonary source.  Chest CT in July showed the esophageal tumor and some adjacent adenopathy, but I am concerned about the possibility of progression and perhaps T-E fistula. That scan has been ordered by the admitting resident team.  In the meantime, keep the patient n.p.o., twice daily IV PPI we will reevaluate tomorrow.   Nelida Meuse III [phone]

## 2020-09-22 NOTE — ED Triage Notes (Signed)
Pt arrives to ED via [facility] from [facility] for ENT consult, d/t hemoptysis. Pt has hx of esophageal and stomach cancer.

## 2020-09-22 NOTE — ED Notes (Signed)
Admitting made aware of patients request for IV medication for sleep.

## 2020-09-22 NOTE — ED Provider Notes (Signed)
[facility] EMERGENCY DEPARTMENT Provider Note   CSN: [id number] Arrival date & time: 09/22/20  4098     History Chief Complaint  Patient presents with  . Hemoptysis    Timothy Oconnor is a 69 y.o. male.  He has a history of esophageal cancer and is on active chemotherapy with Dr. Delton Coombes.  He is also had vocal cord paralysis and laryngeal dysfunction.  He had a Silastic procedure done to his left vocal fold about a month ago by Dr. Joya Gaskins out of [facility].  He was just seen by Dr. Joya Gaskins again 2 days ago and had a laryngoscopy.  He was told to do exercises involving swallowing hard and manipulating the larynx.  He usually has a lot of oral secretions that are thick and he spits them out.  He noticed today that when he spit out the secretions there was blood in them.  He does not have any increase in throat pain.  No dizziness lightheadedness.  Sometimes gets chest pain.  No vomiting.  No increased shortness of breath.  Not on blood thinners.  The history is provided by the patient and the spouse.       Past Medical History:  Diagnosis Date  . 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 Anxiety   . Diabetes ([place])   . Heart attack ([place])   . Heart disease   . High cholesterol   . Hyperplasia of prostate   . Hypertension   . Low back pain   . Port-A-Cath in place 12/30/2019  . Tremor     Patient Active Problem List   Diagnosis Date Noted  . Abdominal distension 08/03/2020  . PEG tube malfunction ([place]) 08/03/2020  . Bone metastases ([place]) 03/24/2020  . PEG (percutaneous endoscopic gastrostomy) status ([place]) 01/08/2020  . Port-A-Cath in place 12/30/2019  . Goals of care, counseling/discussion 12/29/2019  . 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 Esophageal cancer ([place])   . Dysphagia 11/30/2019  . HTN (hypertension) 11/30/2019  . Type 2 diabetes mellitus without complication ([place]) 11/91/4782  . HLD (hyperlipidemia) 11/30/2019  . BPH (benign prostatic  hyperplasia) 11/30/2019  . Tremor 12/08/2015    Past Surgical History:  Procedure Laterality Date  . arterectomy     1993  . BIOPSY  12/01/2019   Procedure: BIOPSY;  Surgeon: Rogene Houston, MD;  Location: [facility] ENDO SUITE;  Service: Endoscopy;;  esophagus  . ESOPHAGOGASTRODUODENOSCOPY (EGD) WITH PROPOFOL N/A 12/01/2019   Procedure: ESOPHAGOGASTRODUODENOSCOPY (EGD) WITH PROPOFOL;  Surgeon: Rogene Houston, MD;  Location: [facility] END[facility] SUITE;  Service: Endoscopy;  Laterality: N/A;  . ESOPHAGOGASTRODUODENOSCOPY (EGD) WITH PROPOFOL N/A 12/24/2019   Procedure: ESOPHAGOGASTRODUODENOSCOPY (EGD) WITH PROPOFOL;  Surgeon: Aviva Signs, MD;  Location: [facility] ORS;  Service: General;  Laterality: N/A;  . Heart stents     2002, 2004, 2006  . PEG PLACEMENT N/A 12/24/2019   Procedure: PERCUTANEOUS ENDOSCOPIC GASTROSTOMY (PEG) PLACEMENT;  Surgeon: Aviva Signs, MD;  Location: [facility] ORS;  Service: General;  Laterality: N/A;  . PORTACATH PLACEMENT Left 12/24/2019   Procedure: INSERTION PORT-A-CATH;  Surgeon: Aviva Signs, MD;  Location: [facility] ORS;  Service: General;  Laterality: Left;       Family History  Problem Relation Age of Onset  . Alzheimer's disease Mother   . Diabetes Father   . Heart disease Father   . Stroke Father   . Heart disease Brother   . Colon cancer Brother   . Alzheimer's disease Sister     Social History  Tobacco Use  . Smoking status: Former Smoker    Packs/day: 1.00    Years: 50.00    Pack years: 50.00    Quit date: 12/21/2017    Years since quitting: 2.7  . Smokeless tobacco: Never Used  . Tobacco comment: Quit 2017  Vaping Use  . Vaping Use: Never used  Substance Use Topics  . Alcohol use: Yes    Alcohol/week: 0.0 standard drinks    Comment: 3 drinks per week  . Drug use: No    Home Medications Prior to Admission medications   Medication Sig Start Date End Date Taking? Authorizing Provider  ALPRAZolam Duanne Moron) 0.5 MG tablet Take 1 tablet (0.5 mg total) by mouth 2  (two) times daily as needed for anxiety. 09/08/20   Derek Jack, MD  aspirin EC 81 MG tablet Take 81 mg by mouth daily.     [provider]  atorvastatin (LIPITOR) 80 MG tablet Take 80 mg by mouth every evening.  12/25/11   [provider]  ezetimibe (ZETIA) 10 MG tablet Take 10 mg by mouth daily.  12/25/11   [provider]  folic acid (FOLVITE) 010 MCG tablet Take 800 mcg by mouth daily.     [provider]  furosemide (LASIX) 40 MG tablet Take 1 tablet (40 mg total) by mouth daily as needed for fluid or edema. 09/20/20   Derek Jack, MD  isosorbide mononitrate (IMDUR) 60 MG 24 hr tablet Take 60 mg by mouth daily. 12/16/19   [provider]  losartan (COZAAR) 50 MG tablet Take 25 mg by mouth daily.  11/17/19   [provider]  metFORMIN (GLUCOPHAGE-XR) 500 MG 24 hr tablet Take 500 mg by mouth daily.  04/26/20   [provider]  metoprolol tartrate (LOPRESSOR) 25 MG tablet Take 0.5 tablets (12.5 mg total) by mouth 2 (two) times daily. 12/03/19   Orson Eva, MD  Misc. Devices MISC Please provide osmolite 1.5.  2 cartons four times daily via feeding tube (8 daily total).  Flush with 59ml water before and after administration of osmolite. 07/26/20   Derek Jack, MD  Multiple Vitamin (MULTI-VITAMINS) TABS Take 1 tablet by mouth daily.     [provider]  Oxycodone HCl 10 MG TABS Take 1 tablet (10 mg total) by mouth at bedtime as needed. 06/28/20   Derek Jack, MD  PACLitaxel (TAXOL IV) Inject into the vein.    [provider]  Ramucirumab (CYRAMZA IV) Inject into the vein.    [provider]  tamsulosin (FLOMAX) 0.4 MG CAPS capsule Take 0.4 mg by mouth daily.  12/02/11   [provider]  prochlorperazine (COMPAZINE) 10 MG tablet Take 1 tablet (10 mg total) by mouth every 6 (six) hours as needed (Nausea or vomiting). Patient not taking: Reported on 08/04/2020 12/30/19 08/04/20   Derek Jack, MD    Allergies    Patient has no known allergies.  Review of Systems   Review of Systems  Constitutional: Negative for fever.  HENT: Positive for trouble swallowing. Negative for nosebleeds and sore throat.   Eyes: Negative for visual disturbance.  Respiratory: Positive for choking. Negative for shortness of breath.   Cardiovascular: Positive for chest pain (sometimes).  Gastrointestinal: Positive for nausea. Negative for abdominal pain and vomiting.  Genitourinary: Negative for dysuria.  Musculoskeletal: Negative for neck pain.  Skin: Negative for rash.  Neurological: Negative for light-headedness.    Physical Exam Updated Vital Signs BP 110/78   Pulse 98  Temp 98 F (36.7 C) (Oral)   Resp (!) 25   Ht 5\' 10"  (1.778 m)   Wt 77.6 kg   SpO2 98%   BMI 24.54 kg/m   Physical Exam Vitals and nursing note reviewed.  Constitutional:      Appearance: Normal appearance. He is well-developed and underweight.  HENT:     Head: Normocephalic and atraumatic.     Mouth/Throat:     Comments: He has blood in the posterior oropharynx.  I had him rinse it out and looked again and do not identify an actual source of bleeding. Eyes:     Conjunctiva/sclera: Conjunctivae normal.  Cardiovascular:     Rate and Rhythm: Normal rate and regular rhythm.     Pulses: Normal pulses.     Heart sounds: No murmur heard.   Pulmonary:     Effort: Pulmonary effort is normal. No respiratory distress.     Breath sounds: Normal breath sounds.  Abdominal:     Palpations: Abdomen is soft.     Tenderness: There is no abdominal tenderness.  Musculoskeletal:        General: No deformity or signs of injury. Normal range of motion.     Cervical back: Neck supple. No tenderness.  Skin:    General: Skin is warm and dry.     Capillary Refill: Capillary refill takes less than 2 seconds.  Neurological:     General: No focal deficit present.     Mental Status: He is alert.     ED  Results / Procedures / Treatments   Labs (all labs ordered are listed, but only abnormal results are displayed) Labs Reviewed  CBC WITH DIFFERENTIAL/PLATELET - Abnormal; Notable for the following components:      Result Value   RBC 3.52 (*)    Hemoglobin 10.4 (*)    HCT 32.0 (*)    RDW 16.7 (*)    Platelets 456 (*)    All other components within normal limits  COMPREHENSIVE METABOLIC PANEL - Abnormal; Notable for the following components:   Potassium 3.4 (*)    Glucose, Bld 223 (*)    Calcium 8.7 (*)    Albumin 2.7 (*)    All other components within normal limits  RESPIRATORY PANEL BY RT PCR (FLU A&B, COVID)  PROTIME-INR  TROPONIN I (HIGH SENSITIVITY)    EKG EKG Interpretation  Date/Time:  Wednesday September 22 2020 12:22:15 EDT Ventricular Rate:  98 PR Interval:  138 QRS Duration: 70 QT Interval:  408 QTC Calculation: 521 R Axis:   7 Text Interpretation: Sinus tachycardia Ventricular premature complex Low voltage, extremity leads Abnormal lateral Q waves Prolonged QT interval No significant change since prior today Confirmed by Aletta Edouard [phone]) on 09/22/2020 12:30:11 PM   Radiology DG Chest 1 View  Result Date: 09/22/2020 CLINICAL DATA:  Hematemesis.  History of stomach cancer EXAM: CHEST  1 VIEW COMPARISON:  Chest CT 06/09/2020 FINDINGS: Right pleural effusion which could be small or moderate. There is presumed right lower lobe opacity. Left subclavian porta catheter with tip at the SVC. Normal heart size. IMPRESSION: Right pleural effusion/lower lobe opacity, new from July 2021. Electronically Signed   By: Monte Fantasia M.D.   On: 09/22/2020 09:43    Procedures Procedures (including critical care time)  Medications Ordered in ED Medications  ondansetron (ZOFRAN) injection 4 mg (4 mg Intravenous Given 09/22/20 1053)  sodium chloride 0.9 % bolus 1,000 mL (0 mLs Intravenous Stopped 09/22/20 1207)    ED  Course  I have reviewed the triage vital signs and the  nursing notes.  Pertinent labs & imaging results that were available during my care of the patient were reviewed by me and considered in my medical decision making (see chart for details).  Clinical Course as of Sep 22 1254  Wed Sep 22, 2020  1019 Discussed with Dr. Leretha Pol for Dr. Joya Gaskins ENT at [facility].  She said at a month out she would expect that the implant is probably doing okay but could have irritated it doing these exercises.  She said if it is just a small amount of bleeding that the patient can be reassured and go home.  If there is heavier bleeding then he should go to [facility] emergency department where they can evaluate and scope.   [MB]  7867 Reviewed results with patient and his wife.  She would be more comfortable if he would get transferred to [facility] and get scoped so they can see where he is bleeding from.   [MB]  6720 NOBSJGGEZ with [facility] ENT Dr.Waltonen.  He said there is nobody in the clinic that can scope him.  Unfortunately he cannot be accepted ED to ED transfer unless he has an inpatient bed assigned for him.  They are to full with Covid.   [MB]  1200 Discussed with Dr. Benjamine Mola from [facility] on call.  He is willing to see the patient in the [facility] ED if the patient wishes transfer there although he said he is not familiar with this type of surgery so probably would not be able to do much intervention.   [MB]  1220 Discussed with Dr. Gilford Raid: Emergency physician who accepts the patient in transfer.   [MB]  67 Dr. Leonarda Salon was able to look at the clinic note from [facility] ENT that I sent him.  He thinks that it is unlikely that this is a laryngeal source of the bleeding.  He is still planning on scoping the patient to make sure but he thinks that possibly GI may need to be involved.   [MB]    Clinical Course User Index [MB] Hayden Rasmussen, MD   MDM Rules/Calculators/A&P                          This patient complains of oral bleeding question hemoptysis versus hematemesis;  this involves an extensive number of treatment Options and is a complaint that carries with it a high risk of complications and Morbidity. The differential includes hemoptysis, hematemesis, postsurgical bleeding, airway compromise  I ordered, reviewed and interpreted labs, which included CBC with normal white count, hemoglobin slightly lower than baseline, normal platelets, coags normal, CMP with mildly low potassium elevated glucose, Covid testing negative, troponin fairly flat x2 I ordered medication Zofran and fluids I ordered imaging studies which included chest x-ray and I independently    visualized and interpreted imaging which showed right pleural effusion Additional history obtained from patient's wife Previous records obtained and reviewed in epic including recent clinic note from ENT I consulted ENT x2 at [facility] and ENT Dr. Benjamine Mola and discussed lab and imaging findings  Critical Interventions: None  After the interventions stated above, I reevaluated the patient and found patient still to be bleeding although in no distress.  He will be transferred to [facility] ED where he can be seen by ENT.  Possibly will need admission for further isolation of source of bleeding.  Final Clinical Impression(s) / ED Diagnoses Final  diagnoses:  Hemoptysis    Rx / DC Orders ED Discharge Orders    None       Hayden Rasmussen, MD 09/22/20 [phone]

## 2020-09-22 NOTE — ED Provider Notes (Signed)
Patient is a transfer from [facility] to see Dr. Benjamine Mola for evaluation of hemoptysis. Patient with history of esophageal cancer. Silastic procedure to left vocal fold one month ago by Dr. Joya Gaskins at [facility]. He was seen in the office on Monday and started exercises swallowing hard to manipulate the larynx. Patient reports coughing up blood since this morning. He denies abdominal or esophageal discomfort. He is currently resting comfortably, vital signs stable.  Dr. Deeann Saint office notified of the patient's arrival.  1700: patient has been seen by ENT and GI. Plan to admit with likely EDG tomorrow.  1740: Patient will be admitted to unassigned medicine (Internal Medicine team).   Etta Quill, NP 09/22/20 2043    Fredia Sorrow, MD 09/23/20 [phone]

## 2020-09-22 NOTE — ED Notes (Signed)
[facility] transporting patient to [facility].

## 2020-09-22 NOTE — H&P (Addendum)
Date: 09/22/2020               Patient Name:  Timothy Oconnor MRN: [id number]  DOB: [birth year] Age / Sex: 69 y.o., male   PCP: [name], MD         Medical Service: Internal Medicine Teaching Service         Attending Physician: Dr. Lucious Groves, DO    First Contact: Dr. Johnney Ou Pager: [phone]  Second Contact: Dr. Gilford Rile Pager: [phone]       After Hours (After 5p/  First Contact Pager: [phone]  weekends / holidays): Second Contact Pager: [phone]   Chief Complaint: Hemoptysis  History of Present Illness:   Mr. 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 He states the amount was " a couple of tablespoons." He endorses spitting up saliva for a few weeks that has not been bloody prior to today. The last few months the amount of saliva has increased. Denies vomiting on a regular basis, but does endorse some vomiting in the evenings. Sometimes when he is spitting up saliva turns into vomiting. He noticed his last bowel movement was darker in color than priors. He denies blood from feeding tube. It was placed in January and has not been changed since.   He had a recent procedure at [facility] for paralyzed vocal cord on left. Started on laryngeal exercises to help strengthen the area last week. Since the procedure, the patient gags every time he swallows. Has history of esophageal cancer which spread to lymph nodes. Scan in August showed growth in his abdomen cavity, somewhere near his feeding tube.   Denies any recent illness. Had a fall last week and after his fall, his umbilical hernia was no longer present, but he complained of a new hernia near his groin and had  enlargement of his scrotum.  No other complaints or concerns at the time of my examination.  Meds:  Current Meds  Medication Sig  . ALPRAZolam (XANAX) 0.5 MG tablet Take 1 tablet (0.5 mg total) by mouth 2 (two) times daily as needed for anxiety. (Patient taking differently: Take 0.5 mg by mouth See admin instructions. Take 0.5 mg by mouth at bedtime and an additional 0.5 mg once a day as needed for anxiety)  . aspirin EC 81 MG tablet Take 81 mg by mouth daily.   Marland Kitchen atorvastatin (LIPITOR) 80 MG tablet Take 80 mg by mouth every evening.   . ezetimibe (ZETIA) 10 MG tablet Take 10 mg by mouth daily.   . folic acid (FOLVITE) 338 MCG tablet Take 800 mcg by mouth daily.   . furosemide (LASIX) 20 MG tablet Take 20 mg by mouth daily as needed for fluid or edema (only if Systolic number is 250 or greater).   . isosorbide mononitrate (IMDUR) 60 MG 24 hr tablet Take 60 mg by mouth daily.  . metFORMIN (GLUCOPHAGE-XR) 500 MG 24 hr tablet Take 500 mg by mouth daily as needed (for elevated BGL).   . Misc. Devices MISC Please provide osmolite 1.5.  2 cartons four times daily via feeding tube (8 daily total).  Flush with 31ml water before and after administration of osmolite. (Patient  taking differently: Osmolite 1.5.- 1-2 cartons three times daily via feeding tube (2-6 daily total).  Flush with 60 ml's water before and after administration of Osmolite.)  . Multiple Vitamin (MULTI-VITAMINS) TABS Take 1 tablet by mouth 2 (two) times a week.   . Oxycodone HCl 10 MG TABS Take 1 tablet (10 mg total) by mouth at bedtime as needed. (Patient taking differently: Take 10 mg by mouth at bedtime as needed (for pain). )  . PACLitaxel (TAXOL IV) Inject into the vein See admin instructions. Via IV once a week- three weeks on/1 week off  . Ramucirumab (CYRAMZA IV) Inject into the vein as directed.   . tamsulosin (FLOMAX) 0.4 MG CAPS capsule Take 0.4 mg by mouth daily.      Allergies: Allergies as of 09/22/2020  . (No Known  Allergies)   Past Medical History:  Diagnosis Date  . 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 Anxiety   . Diabetes ([place])   . Heart attack ([place])   . Heart disease   . High cholesterol   . Hyperplasia of prostate   . Hypertension   . Low back pain   . Port-A-Cath in place 12/30/2019  . Tremor    Cardiac History   Family History:   Father had heart problems, died of stroke. First MI at 68.  Brother has heart issues, bypass surgery at ~50s. He also had colon cancer s/p surgery in remission. Also, COPD.  Sister had alzheimer's.   Social History:  Smoked for 40 years, a pack a day. Quit around 5 years ago.  Drinks alcohol, has not drank in months.  No illicit drug use.   Review of Systems: A complete ROS was negative except as per HPI.  Physical Exam: Blood pressure 102/69, pulse 96, temperature (!) 97.5 F (36.4 C), temperature source Oral, resp. rate (!) 21, height 5\' 10"  (1.778 m), weight 77.6 kg, SpO2 92 %. Physical Exam Vitals and nursing note reviewed.  Constitutional:      Appearance: Normal appearance.     Comments: Thin, frail  Cardiovascular:     Rate and Rhythm: Normal rate and regular rhythm.     Pulses: Normal pulses.     Heart sounds: Normal heart sounds. No murmur heard.  No friction rub. No gallop.   Pulmonary:     Effort: Pulmonary effort is normal. No respiratory distress.     Breath sounds: No stridor. No wheezing, rhonchi or rales.  Abdominal:     General: Abdomen is flat. There is no distension.     Palpations: Abdomen is soft.     Tenderness: There is no abdominal tenderness. There is no guarding or rebound.     Comments: G tube in place with no active bleeding from site.   Genitourinary:    Comments: Right sided non-reducible inguinal hernia. No change with cough Musculoskeletal:     Right lower leg: No edema.     Left lower leg: No edema.  Skin:    Coloration: Skin is pale.  Neurological:     General: No focal deficit  present.     Mental Status: He is alert and oriented to person, place, and time.    EKG: personally reviewed my interpretation is rate of 95 bpm, normal sinus rhythm, no axis deviation present. q waves present in inferior leads. No ST wave changes. Compared to prior EKG from 2021, no q waves present at that time.   CXR: Impression: Right pleural effusion/lower lobe opacity, new  from July 2021.  Assessment & Plan by Problem: Principal Problem:   Hemoptysis Active Problems:   Adenocarcinoma of gastroesophageal junction [facility])  Mr. 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 One episode of dark stool today. Patient with current esophogeal cancer, worsening of this may be the source of bleeding. Other possible etiology is laryngeal from recent vocal cord procedure. Also in differential is upper GI ulcer, new lung mets, pulmonary infection, TE fistula. Difficult to assess whether source of hemoptysis is pleural vs gastrointestinal in nature. ENT and GI consulted. Patient with flexible laryngoscopy performed, no abnormality or source of bleeding was found. G-tube was flushed, fluid pulled out was "dark black, coffee ground consistency." Dr. Loletha Carrow, gastroenterologist, in room during our interview, would like chest CT and will from there decide whether or not an endoscopy needs to be performed. Appreciate his assistance in the patient's care.  -pending CT  chest -possible endoscopy per GI pending CT results. NPO at this time -protonix 40 mg IV BID -hold home aspirin -chemotherapy every Wednesday, missed session today  Right Sided Inguinal Hernia Non-reducible right sided inguinal hernia. Non tender upon palpation. No warmth or erythema present. Patient is afebrile, without acute abdominal pain or nausea. Do not suspect incarceration or strangulation at this time. Do not believe this requires surgical consult and also with worsening metastatic cancer, do not believe patient would be appropriate surgical candidate.  -continue to monitor -follow up with pcp on outpatient basis  Type II Diabetes Mellitus -SSI during admission -patient takes metformin as needed at home  Hyperlipidemia -continue home atorvastatin and ezetimibe   Diet: NPO DVT Prophylaxis: SCD Code Status: Full Dispo: Admit patient to Inpatient with expected length of stay greater than 2 midnights.  SignedRiesa Pope, MD 09/22/2020, 7:20 PM  Pager: [phone] After 5pm on weekdays and 1pm on weekends: On Call pager: [phone]

## 2020-09-22 NOTE — Consult Note (Signed)
Reason for Consult: Spitting up blood Referring Physician: Aletta Edouard, MD  HPI:  Timothy Oconnor is an 69 y.o. male with a history of esophageal cancer and is on active chemotherapy with Dr. Delton Coombes.  He is also has a history of left vocal cord paralysis, status post medialization procedure by Dr. Joya Gaskins at [facility].  He was just seen by Dr. Joya Gaskins again 2 days ago and had a laryngoscopy.  He recently noticed that when he spit out his oral secretions, there was blood in them.  He does not have any increase in throat pain.  No dizziness or lightheadedness.   No significant vomiting.  No increased shortness of breath.  Not on blood thinners.   Past Medical History:  Diagnosis Date  . 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 Anxiety   . Diabetes ([place])   . Heart attack ([place])   . Heart disease   . High cholesterol   . Hyperplasia of prostate   . Hypertension   . Low back pain   . Port-A-Cath in place 12/30/2019  . Tremor     Past Surgical History:  Procedure Laterality Date  . arterectomy     1993  . BIOPSY  12/01/2019   Procedure: BIOPSY;  Surgeon: Rogene Houston, MD;  Location: [facility];  Service: Endoscopy;;  esophagus  . ESOPHAGOGASTRODUODENOSCOPY (EGD) WITH PROPOFOL N/A 12/01/2019   Procedure: ESOPHAGOGASTRODUODENOSCOPY (EGD) WITH PROPOFOL;  Surgeon: Rogene Houston, MD;  Location: [facility];  Service: Endoscopy;  Laterality: N/A;  . ESOPHAGOGASTRODUODENOSCOPY (EGD) WITH PROPOFOL N/A 12/24/2019   Procedure: ESOPHAGOGASTRODUODENOSCOPY (EGD) WITH PROPOFOL;  Surgeon: Aviva Signs, MD;  Location: [facility] ORS;  Service: General;  Laterality: N/A;  . Heart stents     2002, 2004, 2006  . PEG PLACEMENT N/A 12/24/2019   Procedure: PERCUTANEOUS ENDOSCOPIC GASTROSTOMY (PEG) PLACEMENT;  Surgeon: Aviva Signs, MD;  Location: [facility] ORS;  Service: General;  Laterality: N/A;  . PORTACATH PLACEMENT Left 12/24/2019   Procedure: INSERTION PORT-A-CATH;  Surgeon: Aviva Signs, MD;  Location: [facility] ORS;  Service: General;  Laterality: Left;    Family History  Problem Relation Age of Onset  . Alzheimer's disease Mother   . Diabetes Father   . Heart disease Father   . Stroke Father   . Heart disease Brother   . Colon cancer Brother   . Alzheimer's disease Sister     Social History:  reports that he quit smoking about 2 years ago. He has a 50.00 pack-year smoking history. He has never used smokeless tobacco. He reports current alcohol use. He reports that he does not use drugs.  Allergies: No Known Allergies  Prior to Admission medications   Medication Sig Start Date End Date Taking? Authorizing Provider  ALPRAZolam Duanne Moron) 0.5 MG tablet Take 1 tablet (0.5 mg total) by mouth 2 (two) times daily as needed for anxiety. 09/08/20   Derek Jack, MD  aspirin EC 81 MG tablet Take 81 mg by mouth daily.     [provider]  atorvastatin (LIPITOR) 80 MG tablet Take 80 mg by mouth every evening.  12/25/11   [provider]  ezetimibe (ZETIA) 10 MG tablet Take 10 mg by mouth daily.  12/25/11   [provider]  folic acid (FOLVITE) 474 MCG tablet Take 800 mcg by mouth daily.     [provider]  furosemide (LASIX) 40 MG tablet Take 1 tablet (40 mg total) by mouth daily as needed for fluid or edema.  09/20/20   Derek Jack, MD  isosorbide mononitrate (IMDUR) 60 MG 24 hr tablet Take 60 mg by mouth daily. 12/16/19   [provider]  losartan (COZAAR) 50 MG tablet Take 25 mg by mouth daily.  11/17/19   [provider]  metFORMIN (GLUCOPHAGE-XR) 500 MG 24 hr tablet Take 500 mg by mouth daily.  04/26/20   [provider]  metoprolol tartrate (LOPRESSOR) 25 MG tablet Take 0.5 tablets (12.5 mg total) by mouth 2 (two) times daily. 12/03/19   Orson Eva, MD  Misc. Devices MISC Please provide osmolite 1.5.  2 cartons four times daily via feeding tube (8 daily total).  Flush with 11ml water before and after  administration of osmolite. 07/26/20   Derek Jack, MD  Multiple Vitamin (MULTI-VITAMINS) TABS Take 1 tablet by mouth daily.     [provider]  Oxycodone HCl 10 MG TABS Take 1 tablet (10 mg total) by mouth at bedtime as needed. 06/28/20   Derek Jack, MD  PACLitaxel (TAXOL IV) Inject into the vein.    [provider]  Ramucirumab (CYRAMZA IV) Inject into the vein.    [provider]  tamsulosin (FLOMAX) 0.4 MG CAPS capsule Take 0.4 mg by mouth daily.  12/02/11   [provider]  prochlorperazine (COMPAZINE) 10 MG tablet Take 1 tablet (10 mg total) by mouth every 6 (six) hours as needed (Nausea or vomiting). Patient not taking: Reported on 08/04/2020 12/30/19 08/04/20  Derek Jack, MD    Results for orders placed or performed during the hospital encounter of 09/22/20 (from the past 48 hour(s))  CBC with Differential     Status: Abnormal   Collection Time: 09/22/20  9:25 AM  Result Value Ref Range   WBC 5.0 4.0 - 10.5 K/uL   RBC 3.52 (L) 4.22 - 5.81 MIL/uL   Hemoglobin 10.4 (L) 13.0 - 17.0 g/dL   HCT 32.0 (L) 39 - 52 %   MCV 90.9 80.0 - 100.0 fL   MCH 29.5 26.0 - 34.0 pg   MCHC 32.5 30.0 - 36.0 g/dL   RDW 16.7 (H) 11.5 - 15.5 %   Platelets 456 (H) 150 - 400 K/uL   nRBC 0.0 0.0 - 0.2 %   Neutrophils Relative % 67 %   Neutro Abs 3.3 1.7 - 7.7 K/uL   Lymphocytes Relative 22 %   Lymphs Abs 1.1 0.7 - 4.0 K/uL   Monocytes Relative 9 %   Monocytes Absolute 0.4 0.1 - 1.0 K/uL   Eosinophils Relative 0 %   Eosinophils Absolute 0.0 0.0 - 0.5 K/uL   Basophils Relative 1 %   Basophils Absolute 0.0 0.0 - 0.1 K/uL   Immature Granulocytes 1 %   Abs Immature Granulocytes 0.03 0.00 - 0.07 K/uL    Comment: Performed at [facility], [address] [place], [place] [address]  Comprehensive metabolic panel     Status: Abnormal   Collection Time: 09/22/20  9:25 AM  Result Value Ref Range   Sodium 137 135 - 145 mmol/L   Potassium 3.4 (L) 3.5 -  5.1 mmol/L   Chloride 99 98 - 111 mmol/L   CO2 25 22 - 32 mmol/L   Glucose, Bld 223 (H) 70 - 99 mg/dL    Comment: Glucose reference range applies only to samples taken after fasting for at least 8 hours.   BUN 23 8 - 23 mg/dL   Creatinine, Ser 0.68 0.61 - 1.24 mg/dL   Calcium 8.7 (L) 8.9 -  10.3 mg/dL   Total Protein 6.6 6.5 - 8.1 g/dL   Albumin 2.7 (L) 3.5 - 5.0 g/dL   AST 34 15 - 41 U/L   ALT 38 0 - 44 U/L   Alkaline Phosphatase 68 38 - 126 U/L   Total Bilirubin 0.4 0.3 - 1.2 mg/dL   GFR, Estimated >60 >60 mL/min   Anion gap 13 5 - 15    Comment: Performed at [facility], [address], [place], [place] [address]  Protime-INR     Status: None   Collection Time: 09/22/20 10:05 AM  Result Value Ref Range   Prothrombin Time 13.1 11.4 - 15.2 seconds   INR 1.0 0.8 - 1.2    Comment: (NOTE) INR goal varies based on device and disease states. Performed at [facility], [address], [place],  [address]   Respiratory Panel by RT PCR (Flu A&B, Covid) - Nasopharyngeal Swab     Status: None   Collection Time: 09/22/20 11:39 AM   Specimen: Nasopharyngeal Swab  Result Value Ref Range   SARS Coronavirus 2 by RT PCR NEGATIVE NEGATIVE    Comment: (NOTE) SARS-CoV-2 target nucleic acids are NOT DETECTED.  The SARS-CoV-2 RNA is generally detectable in upper respiratoy specimens during the acute phase of infection. The lowest concentration of SARS-CoV-2 viral copies this assay can detect is 131 copies/mL. A negative result does not preclude SARS-Cov-2 infection and should not be used as the sole basis for treatment or other patient management decisions. A negative result may occur with  improper specimen collection/handling, submission of specimen other than nasopharyngeal swab, presence of viral mutation(s) within the areas targeted by this assay, and inadequate number of viral copies (<131 copies/mL). A negative result must be combined with clinical observations, patient history,  and epidemiological information. The expected result is Negative.  Fact Sheet for Patients:  PinkCheek.be  Fact Sheet for Healthcare Providers:  GravelBags.it  This test is no t yet approved or cleared by the [place] FDA and  has been authorized for detection and/or diagnosis of SARS-CoV-2 by FDA under an Emergency Use Authorization (EUA). This EUA will remain  in effect (meaning this test can be used) for the duration of the COVID-19 declaration under Section 564(b)(1) of the Act, 21 U.S.C. section 360bbb-3(b)(1), unless the authorization is terminated or revoked sooner.     Influenza A by PCR NEGATIVE NEGATIVE   Influenza B by PCR NEGATIVE NEGATIVE    Comment: (NOTE) The Xpert Xpress SARS-CoV-2/FLU/RSV assay is intended as an aid in  the diagnosis of influenza from Nasopharyngeal swab specimens and  should not be used as a sole basis for treatment. Nasal washings and  aspirates are unacceptable for Xpert Xpress SARS-CoV-2/FLU/RSV  testing.  Fact Sheet for Patients: PinkCheek.be  Fact Sheet for Healthcare Providers: GravelBags.it  This test is not yet approved or cleared by the [place] FDA and  has been authorized for detection and/or diagnosis of SARS-CoV-2 by  FDA under an Emergency Use Authorization (EUA). This EUA will remain  in effect (meaning this test can be used) for the duration of the  Covid-19 declaration under Section 564(b)(1) of the Act, 21  U.S.C. section 360bbb-3(b)(1), unless the authorization is  terminated or revoked. Performed at [facility], [place] [address] [place], [place] [address]   Troponin I (High Sensitivity)     Status: None   Collection Time: 09/22/20 12:40 PM  Result Value Ref Range   Troponin I (High Sensitivity) 9 <  18 ng/L    Comment: (NOTE) Elevated high sensitivity troponin I (hsTnI) values and  significant  changes across serial measurements may suggest ACS but many other  chronic and acute conditions are known to elevate hsTnI results.  Refer to the "Links" section for chest pain algorithms and additional  guidance. Performed at [facility], [address], [place], [place] [address]     DG Chest 1 View  Result Date: 09/22/2020 CLINICAL DATA:  Hematemesis.  History of stomach cancer EXAM: CHEST  1 VIEW COMPARISON:  Chest CT 06/09/2020 FINDINGS: Right pleural effusion which could be small or moderate. There is presumed right lower lobe opacity. Left subclavian porta catheter with tip at the SVC. Normal heart size. IMPRESSION: Right pleural effusion/lower lobe opacity, new from July 2021. Electronically Signed   By: Monte Fantasia M.D.   On: 09/22/2020 09:43   Review of Systems  Constitutional: Negative for fever.  HENT: Positive for trouble swallowing. Negative for nosebleeds and sore throat.   Eyes: Negative for visual disturbance.  Respiratory: Positive for choking. Negative for shortness of breath.   Cardiovascular: Positive for chest pain (sometimes).  Gastrointestinal: Positive for nausea. Negative for abdominal pain and vomiting.  Genitourinary: Negative for dysuria.  Musculoskeletal: Negative for neck pain.  Skin: Negative for rash.  Neurological: Negative for light-headedness.   Blood pressure 108/78, pulse 96, temperature (!) 97.5 F (36.4 C), temperature source Oral, resp. rate (!) 24, height 5\' 10"  (1.778 m), weight 77.6 kg, SpO2 93 %. General appearance: alert, cooperative and cachectic Head: Normocephalic, without obvious abnormality, atraumatic Eyes: Pupils are equal, round, reactive to light. Extraocular motion is intact.  Ears: Examination of the ears shows normal auricles and external auditory canals bilaterally.  Nose: Nasal examination shows normal mucosa, septum, turbinates.  Face: Facial examination shows no asymmetry. Palpation of the face elicit no  significant tenderness.  Mouth: Oral cavity examination shows no mucosal abnormalities. No significant trismus is noted.  Neck: Palpation of the neck reveals no lymphadenopathy or mass. The trachea is midline. The thyroid is not significantly enlarged. Neck incision well healed. Neuro: Cranial nerves 2-12 are all grossly intact.  Procedure:  Flexible Fiberoptic Laryngoscopy Anesthesia: None Indication: Hematemesis Description: Risks, benefits, and alternatives of flexible endoscopy were explained to the patient. Specific mention was made of the risk of throat numbness with difficulty swallowing, possible bleeding from the nose and mouth, and pain from the procedure.  The patient gave oral consent to proceed.  The flexible scope was inserted into the right nasal cavity and advanced towards the nasopharynx.  Visualized mucosa over the turbinates and septum were normal.  The nasopharynx was clear.  Oropharyngeal walls were symmetric and mobile without lesion, mass, or edema.  Hypopharynx was also without lesion or edema. A small amount of blood was noted around the esophageal inlet.  Larynx was mobile without lesions.  No lesions or asymmetry in the supraglottic larynx. True vocal folds were pale yellow and without mass or lesion.  The right vocal cord is mobile. The left vocal cord is paralyzed at the median position.  Assessment/Plan: Hematemesis, likely from his esophagus. He has a history of esophageal cancer, currently receiving chemotherapy. - A small amount of blood noted around the esophageal inlet.  No laryngeal or pharyngeal lesion is noted. - Left vocal cord paralysis, s/p medialization procedure at [facility]. - Will need upper GI evaluation.  Afsheen Antony W Paulina Muchmore 09/22/2020, 3:58 PM

## 2020-09-22 NOTE — ED Notes (Signed)
Report given to Richardson Landry, RN at [facility] ED.

## 2020-09-22 NOTE — ED Triage Notes (Signed)
Pt c/o of vomiting up blood that started this morning. Pt states he has esophageal and stomach cancer.

## 2020-09-22 NOTE — Telephone Encounter (Signed)
Nutrition  Late entry from 09/21/2020  Wife left message on RD voicemail on 09/21/2020 requesting call back.  RD returned call but wife not available and RD left message.    RD called wife again today and spoke with wife briefly.  Wife reports that patient is being admitted to [facility] and she will talk with me at another time.  Wife appreciative of call back.    Quanika Solem B. Zenia Resides, [place], [place] Registered Dietitian [phone] (mobile)

## 2020-09-23 ENCOUNTER — Encounter (HOSPITAL_COMMUNITY): Payer: Self-pay | Admitting: Certified Registered"

## 2020-09-23 ENCOUNTER — Encounter (HOSPITAL_COMMUNITY)
Admission: EM | Disposition: A | Discharge status: Home / Self Care | Payer: Self-pay | Source: Home / Self Care | Attending: Internal Medicine

## 2020-09-23 DIAGNOSIS — C160 Malignant neoplasm of cardia: Secondary | ICD-10-CM | POA: Diagnosis not present

## 2020-09-23 DIAGNOSIS — R042 Hemoptysis: Secondary | ICD-10-CM | POA: Diagnosis not present

## 2020-09-23 DIAGNOSIS — E440 Moderate protein-calorie malnutrition: Secondary | ICD-10-CM | POA: Insufficient documentation

## 2020-09-23 LAB — CBC
HCT: 27.3 % — ABNORMAL LOW (ref 39.0–52.0)
Hemoglobin: 8.6 g/dL — ABNORMAL LOW (ref 13.0–17.0)
MCH: 29 pg (ref 26.0–34.0)
MCHC: 31.5 g/dL (ref 30.0–36.0)
MCV: 91.9 fL (ref 80.0–100.0)
Platelets: 413 10*3/uL — ABNORMAL HIGH (ref 150–400)
RBC: 2.97 MIL/uL — ABNORMAL LOW (ref 4.22–5.81)
RDW: 17 % — ABNORMAL HIGH (ref 11.5–15.5)
WBC: 4.1 10*3/uL (ref 4.0–10.5)
nRBC: 0 % (ref 0.0–0.2)

## 2020-09-23 LAB — COMPREHENSIVE METABOLIC PANEL
ALT: 32 U/L (ref 0–44)
AST: 28 U/L (ref 15–41)
Albumin: 2.3 g/dL — ABNORMAL LOW (ref 3.5–5.0)
Alkaline Phosphatase: 66 U/L (ref 38–126)
Anion gap: 12 (ref 5–15)
BUN: 18 mg/dL (ref 8–23)
CO2: 24 mmol/L (ref 22–32)
Calcium: 8.4 mg/dL — ABNORMAL LOW (ref 8.9–10.3)
Chloride: 104 mmol/L (ref 98–111)
Creatinine, Ser: 0.72 mg/dL (ref 0.61–1.24)
GFR, Estimated: >60 mL/min (ref >60)
Glucose, Bld: 116 mg/dL — ABNORMAL HIGH (ref 70–99)
Potassium: 3.7 mmol/L (ref 3.5–5.1)
Sodium: 140 mmol/L (ref 135–145)
Total Bilirubin: 0.5 mg/dL (ref 0.3–1.2)
Total Protein: 5.7 g/dL — ABNORMAL LOW (ref 6.5–8.1)

## 2020-09-23 LAB — GLUCOSE, CAPILLARY
Glucose-Capillary: 133 mg/dL — ABNORMAL HIGH (ref 70–99)
Glucose-Capillary: 91 mg/dL (ref 70–99)
Glucose-Capillary: 96 mg/dL (ref 70–99)

## 2020-09-23 SURGERY — CANCELLED PROCEDURE

## 2020-09-23 MED ORDER — OSMOLITE 1.2 CAL PO LIQD
1500.0000 mL | ORAL | Status: DC
  Filled 2020-09-23: qty 2000

## 2020-09-23 MED ORDER — OSMOLITE 1.2 CAL PO LIQD: 1000.0000 mL | ORAL | Status: DC

## 2020-09-23 MED ORDER — CHLORHEXIDINE GLUCONATE CLOTH 2 % EX PADS
6.0000 | MEDICATED_PAD | Freq: Every day | CUTANEOUS | Status: DC
  Administered 2020-09-23 – 2020-09-24 (×2): 6 via TOPICAL

## 2020-09-23 MED ORDER — KATE FARMS STANDARD 1.4 PO LIQD
325.0000 mL | Freq: Every day | ORAL | Status: DC
  Administered 2020-09-23 – 2020-09-24 (×2): 325 mL
  Filled 2020-09-23 (×8): qty 325

## 2020-09-23 MED ORDER — FREE WATER
150.0000 mL | Freq: Every day | Status: DC
  Administered 2020-09-23 – 2020-09-24 (×3): 150 mL

## 2020-09-23 SURGICAL SUPPLY — 14 items

## 2020-09-23 NOTE — Progress Notes (Addendum)
HD#1 Subjective:   No acute events overnight.  Patient evaluated at bedside during rounds. Patient's wife at bedside. Patient reports he is "feeling alright." States he is getting tired of spitting so often. Notes had BM yesterday. Discussed results of yesterday's CT scan which showed significant increase in the degree of thickening of the mid and distal esophagus consist with growth of his known carcinoma. Informed patient of drop in hemoglobin which we suspect is in part dilutional given the fluids he received but that we will keep a close eye on his blood counts. Informed patient we will send his oncologist an update regarding the scan. Endorses thirst.   Objective:  Vital signs in last 24 hours: Vitals:   09/22/20 2130 09/23/20 0002 09/23/20 0239 09/23/20 0453  BP: 91/65 106/74 101/77 113/73  Pulse: 95 96 93 95  Resp: (!) 27 17 18 18   Temp:  97.7 F (36.5 C) 98.2 F (36.8 C) 97.7 F (36.5 C)  TempSrc:  Oral    SpO2: 91% 98% 91% 96%  Weight:      Height:       Supplemental O2: Room Air SpO2: 96 %   Physical Exam:  Physical Exam Vitals and nursing note reviewed.  Constitutional:      Appearance: He is ill-appearing.     Comments: Thin, frail, cachetic   Cardiovascular:     Rate and Rhythm: Normal rate and regular rhythm.     Pulses: Normal pulses.     Heart sounds: Normal heart sounds. No murmur heard.  No friction rub. No gallop.   Pulmonary:     Effort: Pulmonary effort is normal. No respiratory distress.  Abdominal:     Comments: g tube in place  Skin:    General: Skin is warm and dry.  Neurological:     General: No focal deficit present.     Mental Status: He is alert and oriented to person, place, and time. Mental status is at baseline.  Psychiatric:        Mood and Affect: Mood normal.        Behavior: Behavior normal.     Filed Weights   09/22/20 0912  Weight: 77.6 kg     Intake/Output Summary (Last 24 hours) at 09/23/2020 0750 Last data  filed at 09/22/2020 1207 Gross per 24 hour  Intake 1000 ml  Output --  Net 1000 ml   Net IO Since Admission: 1,000 mL [09/23/20 0750]  Pertinent Labs: CBC Latest Ref Rng & Units 09/23/2020 09/22/2020 09/15/2020  WBC 4.0 - 10.5 K/uL 4.1 5.0 4.7  Hemoglobin 13.0 - 17.0 g/dL 8.6(L) 10.4(L) 11.5(L)  Hematocrit 39 - 52 % 27.3(L) 32.0(L) 35.1(L)  Platelets 150 - 400 K/uL 413(H) 456(H) 425(H)    CMP Latest Ref Rng & Units 09/23/2020 09/22/2020 09/15/2020  Glucose 70 - 99 mg/dL 116(H) 223(H) 200(H)  BUN 8 - 23 mg/dL 18 23 23   Creatinine 0.61 - 1.24 mg/dL 0.72 0.68 0.74  Sodium 135 - 145 mmol/L 140 137 136  Potassium 3.5 - 5.1 mmol/L 3.7 3.4(L) 3.8  Chloride 98 - 111 mmol/L 104 99 98  CO2 22 - 32 mmol/L 24 25 26   Calcium 8.9 - 10.3 mg/dL 8.4(L) 8.7(L) 9.1  Total Protein 6.5 - 8.1 g/dL 5.7(L) 6.6 6.6  Total Bilirubin 0.3 - 1.2 mg/dL 0.5 0.4 0.3  Alkaline Phos 38 - 126 U/L 66 68 66  AST 15 - 41 U/L 28 34 27  ALT 0 - 44 U/L 32  38 35    Imaging: DG Chest 1 View  Result Date: 09/22/2020 CLINICAL DATA:  Hematemesis.  History of stomach cancer EXAM: CHEST  1 VIEW COMPARISON:  Chest CT 06/09/2020 FINDINGS: Right pleural effusion which could be small or moderate. There is presumed right lower lobe opacity. Left subclavian porta catheter with tip at the SVC. Normal heart size. IMPRESSION: Right pleural effusion/lower lobe opacity, new from July 2021. Electronically Signed   By: Monte Fantasia M.D.   On: 09/22/2020 09:43   CT CHEST WO CONTRAST  Result Date: 09/22/2020 CLINICAL DATA:  Hemoptysis, history of esophageal and gastric cancer EXAM: CT CHEST WITHOUT CONTRAST TECHNIQUE: Multidetector CT imaging of the chest was performed following the standard protocol without IV contrast. COMPARISON:  Chest x-ray from earlier in the same day, CT from 06/09/2020. FINDINGS: Cardiovascular: Somewhat limited due to lack of IV contrast. Aortic calcifications are noted. No aneurysmal dilatation is seen. No  cardiac enlargement is noted. Heavy coronary calcifications are seen. Mediastinum/Nodes: Left chest wall port is noted extending into the superior vena cava. Thoracic inlet is within normal limits. 6 mm superior lymph node is noted adjacent to the brachiocephalic arteries slightly increased in size when compared to prior exam. Pre-vascular lymph nodes are noted also increased from the prior exam now measuring approximately 8 mm in short axis. AP window lymph node is noted measuring almost 14 mm in short axis. Subcarinal lymph node is noted measuring 16 mm in short axis. No sizable hilar lymph nodes are seen although the lack of contrast enhancement limits evaluation. The esophagus demonstrates significant wall in the mid and distal portion consistent with the given clinical history. Filling defect is noted within as well. Lungs/Pleura: Left lung is well aerated without focal infiltrate. Mild basilar atelectasis is noted. Mild to moderate left pleural effusion is noted. More marked consolidation is noted in the right lower lobe and to a lesser degree in the right middle lobe consistent with pneumonia. No obstructing lesion is seen. Large right-sided pleural effusion is noted. The remainder of the right lung is within normal limits. Upper Abdomen: Visualized upper abdomen demonstrates gastrostomy catheter in place. Mild ascites is noted. Scattered lymphadenopathy is noted but less well visualized due to the lack of IV contrast. The omental changes seen previously are noted but to a lesser degree also related to the lack of IV contrast. Musculoskeletal: Degenerative changes of the thoracic spine are noted. No definitive lytic or sclerotic lesion is seen. No acute rib abnormality is noted. IMPRESSION: Increasing bilateral pleural effusions right significantly greater than left with associated consolidation in the right middle lobe and right lower lobe likely representing pneumonia. This may be related to aspiration.  Significant increase in the degree of thickening of the mid and distal esophagus consistent with the patient's known history. This represents a significant increased when compared with July of 2021. Increase in mediastinal lymphadenopathy when compared with the prior exam although this may be reactive possibility of metastatic disease deserves consideration as well. Mild ascites improved from prior CT in August of 2021. The omental changes are again seen but to a lesser degree given the lack of IV contrast. Scattered lymphadenopathy is noted. Aortic Atherosclerosis (ICD10-I70.0). Electronically Signed   By: Inez Catalina M.D.   On: 09/22/2020 19:54    Assessment/Plan:   Principal Problem:   Hemoptysis Active Problems:   HTN (hypertension)   HLD (hyperlipidemia)   Adenocarcinoma of gastroesophageal junction [facility])   Patient Summary: 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 Hgb CT imaging results per above, thickening of esophageal cancer, increased mediastinal lymphadenopathy. GI reviewed the images and because of the findings, believed an endoscopy to be a high risk procedure for the patient. The decision was made to not perform the endoscopy due to risks greatly outweighing the benefits.   At this time, I reached out to the patient's oncologist Dr. Delton Coombes. I appreciate his assistance in this patient's care. He believes the esophageal thickening  found on the patient's CT is due to the chemotherapy agent and does not suspect worsening of the cancer.   As such, the patient will be observed overnight, given tube feeds at a slow infusion rate over 18 hours, and will have a repeat CBC performed tomorrow to assure he is not having worsening anemia and is hemodynamically stable. Once the patient is medically stable, he will be discharged home with follow up with his oncologist Dr. Delton Coombes.    Appreciate the assistance of Dr. Loletha Carrow and Dr. Delton Coombes in this patient's care.   -tube feeds this evening, 1500 mL over 18 hours.  -repeat CBC in the morning -if hemodynamically stable, will discharge home with oncology follow up  Right Middle And Lower Lobe Consolidation  Patient without fever, shortness of breath, cough, chest pain. No signs or symptoms of pneumonia. Do not suspect consolidation is source of patient's bleed.  -continue to monitor -consider repeat imaging in 1-2 weeks -follow up outpatient with PCP   Diet: tube feeds IVF: None,None VTE: SCDs Code: Full PT/OT recs: None, none.  Dispo: Anticipated discharge to Home in 1 days pending repeat CBC and tube feeds.   Sanjuana Letters DO Internal Medicine Resident PGY-1 Pager [phone] Please contact the on call pager after 5 pm and on weekends at [phone].

## 2020-09-23 NOTE — Progress Notes (Signed)
[facility] Progress Note  Chief Complaint: Hemoptysis versus hematemesis  History:  Timothy Oconnor feels about the same as yesterday, and his wife is at the bedside for the entire visit. He has continued to "hock" and spit up blood into a collection bag and tissues, but it is less frequent and darker than yesterday.  He has not vomited blood.  ROS: Cardiovascular: Denies chest pain Respiratory: Dyspneic with minimal exertion   Objective:   Current Facility-Administered Medications:  .  acetaminophen (TYLENOL) tablet 650 mg, 650 mg, Oral, Q6H PRN **OR** acetaminophen (TYLENOL) suppository 650 mg, 650 mg, Rectal, Q6H PRN, Rehman, Areeg N, DO .  ALPRAZolam (XANAX) tablet 0.5 mg, 0.5 mg, Oral, BID PRN, Rehman, Areeg N, DO .  atorvastatin (LIPITOR) tablet 80 mg, 80 mg, Oral, QPM, Rehman, Areeg N, DO .  Chlorhexidine Gluconate Cloth 2 % PADS 6 each, 6 each, Topical, Daily, Hoffman, Erik C, DO .  ezetimibe (ZETIA) tablet 10 mg, 10 mg, Oral, Daily, Rehman, Areeg N, DO .  insulin aspart (novoLOG) injection 0-9 Units, 0-9 Units, Subcutaneous, TID WC, Rehman, Areeg N, DO .  ondansetron (ZOFRAN) tablet 4 mg, 4 mg, Oral, Q6H PRN **OR** ondansetron (ZOFRAN) injection 4 mg, 4 mg, Intravenous, Q6H PRN, Rehman, Areeg N, DO .  oxyCODONE (Oxy IR/ROXICODONE) immediate release tablet 10 mg, 10 mg, Oral, QHS PRN, Rehman, Areeg N, DO .  pantoprazole (PROTONIX) injection 40 mg, 40 mg, Intravenous, BID, Rehman, Areeg N, DO .  tamsulosin (FLOMAX) capsule 0.4 mg, 0.4 mg, Oral, Daily, Rehman, Areeg N, DO     Vital signs in last 24 hrs: Vitals:   09/23/20 0453 09/23/20 1313  BP: 113/73 (!) 100/58  Pulse: 95 88  Resp: 18 18  Temp: 97.7 F (36.5 C) 98 F (36.7 C)  SpO2: 96% 96%    Intake/Output Summary (Last 24 hours) at 09/23/2020 1319 Last data filed at 09/23/2020 1313 Gross per 24 hour  Intake 0 ml  Output --  Net 0 ml     Physical Exam Chronically ill-appearing, thin.  He is in good spirits and  conversational.  HEENT: sclera anicteric, oral mucosa without lesions  Cardiac: RRR without murmurs, S1S2 heard, no peripheral edema  Pulm: clear to auscultation bilaterally, normal RR and effort noted  Abdomen: soft, no tenderness, with active bowel sounds.  Upper abdomen soft but protuberant with ascites.  No drainage from the PEG site.  Skin; warm and dry, no jaundice  Recent Labs:  CBC Latest Ref Rng & Units 09/23/2020 09/22/2020 09/15/2020  WBC 4.0 - 10.5 K/uL 4.1 5.0 4.7  Hemoglobin 13.0 - 17.0 g/dL 8.6(L) 10.4(L) 11.5(L)  Hematocrit 39 - 52 % 27.3(L) 32.0(L) 35.1(L)  Platelets 150 - 400 K/uL 413(H) 456(H) 425(H)    Recent Labs  Lab 09/22/20 1005  INR 1.0   CMP Latest Ref Rng & Units 09/23/2020 09/22/2020 09/15/2020  Glucose 70 - 99 mg/dL 116(H) 223(H) 200(H)  BUN 8 - 23 mg/dL 18 23 23   Creatinine 0.61 - 1.24 mg/dL 0.72 0.68 0.74  Sodium 135 - 145 mmol/L 140 137 136  Potassium 3.5 - 5.1 mmol/L 3.7 3.4(L) 3.8  Chloride 98 - 111 mmol/L 104 99 98  CO2 22 - 32 mmol/L 24 25 26   Calcium 8.9 - 10.3 mg/dL 8.4(L) 8.7(L) 9.1  Total Protein 6.5 - 8.1 g/dL 5.7(L) 6.6 6.6  Total Bilirubin 0.3 - 1.2 mg/dL 0.5 0.4 0.3  Alkaline Phos 38 - 126 U/L 66 68 66  AST 15 - 41 U/L 28  63 27  ALT 0 - 44 U/L 32 38 35     Radiologic studies: CLINICAL DATA:  Hemoptysis, history of esophageal and gastric cancer   EXAM: CT CHEST WITHOUT CONTRAST   TECHNIQUE: Multidetector CT imaging of the chest was performed following the standard protocol without IV contrast.   COMPARISON:  Chest x-ray from earlier in the same day, CT from 06/09/2020.   FINDINGS: Cardiovascular: Somewhat limited due to lack of IV contrast. Aortic calcifications are noted. No aneurysmal dilatation is seen. No cardiac enlargement is noted. Heavy coronary calcifications are seen.   Mediastinum/Nodes: Left chest wall port is noted extending into the superior vena cava. Thoracic inlet is within normal limits. 6  mm superior lymph node is noted adjacent to the brachiocephalic arteries slightly increased in size when compared to prior exam. Pre-vascular lymph nodes are noted also increased from the prior exam now measuring approximately 8 mm in short axis. AP window lymph node is noted measuring almost 14 mm in short axis. Subcarinal lymph node is noted measuring 16 mm in short axis. No sizable hilar lymph nodes are seen although the lack of contrast enhancement limits evaluation. The esophagus demonstrates significant wall in the mid and distal portion consistent with the given clinical history. Filling defect is noted within as well.   Lungs/Pleura: Left lung is well aerated without focal infiltrate. Mild basilar atelectasis is noted. Mild to moderate left pleural effusion is noted. More marked consolidation is noted in the right lower lobe and to a lesser degree in the right middle lobe consistent with pneumonia. No obstructing lesion is seen. Large right-sided pleural effusion is noted. The remainder of the right lung is within normal limits.   Upper Abdomen: Visualized upper abdomen demonstrates gastrostomy catheter in place. Mild ascites is noted. Scattered lymphadenopathy is noted but less well visualized due to the lack of IV contrast. The omental changes seen previously are noted but to a lesser degree also related to the lack of IV contrast.   Musculoskeletal: Degenerative changes of the thoracic spine are noted. No definitive lytic or sclerotic lesion is seen. No acute rib abnormality is noted.   IMPRESSION: Increasing bilateral pleural effusions right significantly greater than left with associated consolidation in the right middle lobe and right lower lobe likely representing pneumonia. This may be related to aspiration.   Significant increase in the degree of thickening of the mid and distal esophagus consistent with the patient's known history. This represents a significant  increased when compared with July of 2021.   Increase in mediastinal lymphadenopathy when compared with the prior exam although this may be reactive possibility of metastatic disease deserves consideration as well.   Mild ascites improved from prior CT in August of 2021. The omental changes are again seen but to a lesser degree given the lack of IV contrast. Scattered lymphadenopathy is noted.   Aortic Atherosclerosis (ICD10-I70.0).     Electronically Signed   By: Inez Catalina M.D.   On: 09/22/2020 19:54   I personally reviewed the images of the chest CT scan.  It is an impressive change from the July scan, particularly regarding the right lung findings.  Assessment & Plan  Assessment:  Hemoptysis Esophageal cancer  From my observations of this patient last evening and today, and after reviewing the CT scan with progression of tumor into the thorax, I think this is hemoptysis rather than hematemesis.  Even if there is tumor bleeding into the esophageal lumen, it would be high risk  upper endoscopy with no expectation of being able to provide endoscopic therapy to stop the bleeding. I do not know where this leaves him with treatment options, but it sounds like he may be approaching hospice.  I decided not to perform the upper endoscopy because I think the risks greatly outweigh the expected benefits.  I communicated this to his primary team, and Timothy Oconnor and his wife are hoping that the patient's oncologist would at least be notified by phone of this update so they can decide where he goes from here.  Thank you for involving me in his care, and I wish I could have done more for him.   Nelida Meuse III Office: [phone]

## 2020-09-23 NOTE — Anesthesia Preprocedure Evaluation (Deleted)
Anesthesia Evaluation  Patient identified by MRN, date of birth, ID band Patient awake    Reviewed: Allergy & Precautions, NPO status , Patient's Chart, lab work & pertinent test results  Airway Mallampati: II  TM Distance: >3 FB Neck ROM: Full    Dental  (+) Teeth Intact   Pulmonary former smoker,    Pulmonary exam normal breath sounds clear to auscultation       Cardiovascular hypertension, Pt. on home beta blockers + Past MI  Normal cardiovascular exam Rhythm:Regular Rate:Normal     Neuro/Psych PSYCHIATRIC DISORDERS Anxiety negative neurological ROS     GI/Hepatic negative GI ROS, Neg liver ROS, Adenocarcinoma of GE junction   Endo/Other  diabetes, Type 2, Oral Hypoglycemic Agents  Renal/GU negative Renal ROS     Musculoskeletal negative musculoskeletal ROS (+)   Abdominal   Peds  Hematology  (+) Blood dyscrasia, anemia ,   Anesthesia Other Findings Day of surgery medications reviewed with the patient.  Reproductive/Obstetrics                             Anesthesia Physical Anesthesia Plan  ASA: II  Anesthesia Plan: MAC   Post-op Pain Management:    Induction: Intravenous  PONV Risk Score and Plan: 1 and Propofol infusion and Treatment may vary due to age or medical condition  Airway Management Planned: Nasal Cannula and Natural Airway  Additional Equipment:   Intra-op Plan:   Post-operative Plan:   Informed Consent: I have reviewed the patients History and Physical, chart, labs and discussed the procedure including the risks, benefits and alternatives for the proposed anesthesia with the patient or authorized representative who has indicated his/her understanding and acceptance.       Plan Discussed with: CRNA and Anesthesiologist  Anesthesia Plan Comments:         Anesthesia Quick Evaluation

## 2020-09-23 NOTE — Progress Notes (Signed)
Initial Nutrition Assessment  DOCUMENTATION CODES:   Non-severe (moderate) malnutrition in context of chronic illness  INTERVENTION:   Initiate bolus feedings:   1 carton (325 ml) Dillard Essex Standard 1.4 via PEG 6 times daily  75 ml free water flush before and after each feeding administration  Tube feeding regimen provides 2730 kcal (100% of needs), 120 grams of protein, and 1404 ml of H2O. 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  GOAL:   Patient will meet greater than or equal to 90% of their needs  MONITOR:   PO intake, Labs, Weight trends, TF tolerance, Skin, I & O's  REASON FOR ASSESSMENT:   Malnutrition Screening Tool    ASSESSMENT:   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   Reviewed I/O's: +1 L x 24 hours  Spoke with pt and wife at bedside. Pt reports he had a bad night last night and politely deferred most questioning to his wife. Per pt wife, pt started coughing up blood yesterday, however, this has improved today. PTA, she confirms that pt was only to consume small amount of liquids by mouth due to frequent coughing and thick mucous.   She confirms that pt is mostly PEG dependent and has been working hard on modifications to TF regimen with Jennet Maduro (RD at [facility]). At last visit, a possible transition to a  more concentrated formula was dicussed. Per wife, pt was only about to tolerate about 3 cans of Boost Very High Calorie daily (meeting approximately 1590 kcals and 66 grams protein, meeting 68% of estimated kcal needs and 55% of estimated protein needs), secondary to early satiety and bloating (per wife, pt with fluid around abdomen which may be exacerbating these symptoms). Pt wife is also concerned about leakage around PEG tube; awaiting MD and GI visits to further evaluate patency of tube and plan to possibly re-start TF. RD provided emotional support to pt and wife and also discussed case with Jennet Maduro, RD at [facility].   Reviewed wt hx; pt has experienced a 7.6% wt loss over the past month, which is significant for time frame.   ADDENDUM: RD received consult to re-start TF. Will trial Dillard Essex, as this was previously discussed in outpatient nutrition visits and TwoCcal and Nutren 2.0 are not currently available on hospital formulary.   Labs reviewed: CBGS: 118 (inpatient orders for glycemic control are 0-9 units inuslin aspart TID with meals).   NUTRITION - FOCUSED PHYSICAL EXAM:    Most Recent Value  Orbital Region Moderate depletion  Upper Arm Region Moderate depletion  Thoracic and Lumbar Region Mild depletion  Buccal Region Moderate depletion  Temple Region Moderate depletion  Clavicle Bone Region Moderate depletion  Clavicle and Acromion Bone Region Moderate depletion  Scapular Bone Region Moderate depletion  Dorsal Hand Mild depletion  Patellar Region Moderate depletion  Anterior Thigh  Region Moderate depletion  Posterior Calf Region Moderate depletion  Edema (RD Assessment) None  Hair Reviewed  Eyes Reviewed  Mouth Reviewed  Skin Reviewed  Nails Reviewed       Diet Order:   Diet Order            Diet regular Room service appropriate? Yes; Fluid consistency: Thin  Diet effective now                 EDUCATION NEEDS:   Education needs have been  addressed  Skin:  Skin Assessment: Reviewed RN Assessment  Last BM:  09/22/20  Height:   Ht Readings from Last 1 Encounters:  09/22/20 5\' 10"  (1.778 m)    Weight:   Wt Readings from Last 1 Encounters:  09/22/20 77.6 kg    Ideal Body Weight:  75.5 kg  BMI:  Body mass index is 24.54 kg/m.  Estimated Nutritional Needs:   Kcal:  8616-8372  Protein:  120-135 grams  Fluid:  > 2 L    Loistine Chance, RD, LDN, [place] Registered Dietitian II Certified Diabetes Care and Education Specialist Please refer to [facility] for RD and/or RD on-call/weekend/after hours pager

## 2020-09-23 NOTE — Progress Notes (Signed)
Patient arrived to unit from ED. Report received from [place], [place]. Patient alert and oriented x4. See assessment. Patient denies pain, rates 0/10. Pt has a blue emesis bag with small amounts of bloody sputum. Extra emesis bags given per pt request. Suction set up for patient at bedside. Patient call bell within reach. Will continue to monitor.

## 2020-09-24 ENCOUNTER — Telehealth (HOSPITAL_COMMUNITY): Payer: Self-pay

## 2020-09-24 LAB — CBC
HCT: 29.5 % — ABNORMAL LOW (ref 39.0–52.0)
Hemoglobin: 9.3 g/dL — ABNORMAL LOW (ref 13.0–17.0)
MCH: 29.2 pg (ref 26.0–34.0)
MCHC: 31.5 g/dL (ref 30.0–36.0)
MCV: 92.8 fL (ref 80.0–100.0)
Platelets: 418 10*3/uL — ABNORMAL HIGH (ref 150–400)
RBC: 3.18 MIL/uL — ABNORMAL LOW (ref 4.22–5.81)
RDW: 17.2 % — ABNORMAL HIGH (ref 11.5–15.5)
WBC: 4.2 10*3/uL (ref 4.0–10.5)
nRBC: 0 % (ref 0.0–0.2)

## 2020-09-24 LAB — GLUCOSE, CAPILLARY
Glucose-Capillary: 103 mg/dL — ABNORMAL HIGH (ref 70–99)
Glucose-Capillary: 102 mg/dL — ABNORMAL HIGH (ref 70–99)

## 2020-09-24 MED ORDER — HEPARIN SOD (PORK) LOCK FLUSH 100 UNIT/ML IV SOLN
500.0000 [IU] | INTRAVENOUS | Status: AC | PRN
  Administered 2020-09-24: 500 [IU]
  Filled 2020-09-24: qty 5

## 2020-09-24 NOTE — Telephone Encounter (Signed)
Patient's wife called to see about setting up follow up since patient just got out of the hospital. Up front scheduled appointment for 09/27/2020 @ 930 per Dr. Raliegh Ip.

## 2020-09-24 NOTE — Care Management Important Message (Signed)
Important Message  Patient Details  Name: Timothy Oconnor MRN: [id number] Date of Birth: [birth year]   Medicare Important Message Given:  Yes     Elsa Ploch 09/24/2020, 3:00 PM

## 2020-09-24 NOTE — Progress Notes (Signed)
Timothy Oconnor to be D/C'd per MD order. Discussed with the patient and all questions fully answered. ? VSS, Skin clean, dry and intact without evidence of skin break down, no evidence of skin tears noted. ? IV team de-accessed port-a-cath. ? An After Visit Summary was printed and given to the patient. Patient informed where to pickup prescriptions. ? D/c education completed with patient/family including follow up instructions, medication list, d/c activities limitations if indicated, with other d/c instructions as indicated by MD - patient able to verbalize understanding, all questions fully answered.  ? Patient instructed to return to ED, call 911, or call MD for any changes in condition.  ? Patient to be escorted via [place], and D/C home via private auto with spouse.

## 2020-09-24 NOTE — Discharge Instructions (Signed)
Timothy Oconnor, thank you for allowing [place] to care for you today. Please follow up with your oncologist, Dr. Raliegh Ip, as well as your primary care physician.   Hemoptysis  Hemoptysis is when you cough up blood. It can be mild or serious. If it is mild, you may cough up bloody spit and mucus (sputum). If you cough up 1-2 cups (240-480 mL) of blood within 24 hours (massive hemoptysis), it is an emergency. If you cough up blood, it is important to go and see your doctor. Follow these instructions at home:  Watch your condition for any changes.  Take over-the-counter and prescription medicines only as told by your doctor.  If you were prescribed an antibiotic medicine, take it as told by your doctor. Do not stop taking the antibiotic even if you start to feel better.  Go back to your normal activities as told by your doctor. Ask your doctor what activities are safe for you to do.  Do not use any products that contain nicotine or tobacco. These include cigarettes and e-cigarettes. If you need help quitting, ask your doctor.  Keep all follow-up visits as told by your doctor. This is important. Contact a doctor if:  You have a fever.  You cough up bloody spit and mucus. Get help right away if:  You cough up fresh blood or blood clots.  You have trouble breathing.  You have chest pain. This information is not intended to replace advice given to you by your health care provider. Make sure you discuss any questions you have with your health care provider. Document Revised: 11/02/2017 Document Reviewed: 08/18/2016 Elsevier Patient Education  2020 [employer].

## 2020-09-24 NOTE — Progress Notes (Signed)
HD#2 Subjective:  Overnight Events: None   Patient resting in bed upon presentation. He states he is doing well, he was only able to tolerate one tube of feeds yesterday evening. He denies any episodes of coughing up bright red blood, he notes every once in a while he will spit up dark colored phlegm. He also endorses back pain for the past week, he endorses heating pad yesterday evening helped relieve some of the pain.   Patient continue to deny fever, chills, cough, shortness of breath   Objective:  Vital signs in last 24 hours: Vitals:   09/23/20 1313 09/23/20 1823 09/23/20 2015 09/24/20 0323  BP: (!) 100/58 110/72 109/67 95/69  Pulse: 88 89 91 84  Resp: 18 18 18 17   Temp: 98 F (36.7 C) 98.1 F (36.7 C) 98.1 F (36.7 C) 97.7 F (36.5 C)  TempSrc: Oral Oral    SpO2: 96% 99% 96% 97%  Weight:      Height:       Supplemental O2: Room Air SpO2: 97 %  Physical Exam:   Physical Exam Vitals and nursing note reviewed.  Constitutional:      Appearance: He is ill-appearing.     Comments: Thin, frail, cachetic   Cardiovascular:     Rate and Rhythm: Normal rate and regular rhythm.     Pulses: Normal pulses.     Heart sounds: Normal heart sounds. No murmur heard.  No friction rub. No gallop.   Pulmonary:     Effort: Pulmonary effort is normal. No respiratory distress.  Abdominal:     Comments: g tube in place  Skin:    General: Skin is warm and dry.  Neurological:     General: No focal deficit present.     Mental Status: He is alert and oriented to person, place, and time. Mental status is at baseline.  Psychiatric:        Mood and Affect: Mood normal.        Behavior: Behavior normal.  Filed Weights   09/22/20 0912  Weight: 77.6 kg     Intake/Output Summary (Last 24 hours) at 09/24/2020 0616 Last data filed at 09/24/2020 0400 Gross per 24 hour  Intake 475 ml  Output --  Net 475 ml   Net IO Since Admission: 1,475 mL [09/24/20 0616]  Pertinent Labs: CBC  Latest Ref Rng & Units 09/24/2020 09/23/2020 09/22/2020  WBC 4.0 - 10.5 K/uL 4.2 4.1 5.0  Hemoglobin 13.0 - 17.0 g/dL 9.3(L) 8.6(L) 10.4(L)  Hematocrit 39 - 52 % 29.5(L) 27.3(L) 32.0(L)  Platelets 150 - 400 K/uL 418(H) 413(H) 456(H)    CMP Latest Ref Rng & Units 09/23/2020 09/22/2020 09/15/2020  Glucose 70 - 99 mg/dL 116(H) 223(H) 200(H)  BUN 8 - 23 mg/dL 18 23 23   Creatinine 0.61 - 1.24 mg/dL 0.72 0.68 0.74  Sodium 135 - 145 mmol/L 140 137 136  Potassium 3.5 - 5.1 mmol/L 3.7 3.4(L) 3.8  Chloride 98 - 111 mmol/L 104 99 98  CO2 22 - 32 mmol/L 24 25 26   Calcium 8.9 - 10.3 mg/dL 8.4(L) 8.7(L) 9.1  Total Protein 6.5 - 8.1 g/dL 5.7(L) 6.6 6.6  Total Bilirubin 0.3 - 1.2 mg/dL 0.5 0.4 0.3  Alkaline Phos 38 - 126 U/L 66 68 66  AST 15 - 41 U/L 28 34 27  ALT 0 - 44 U/L 32 38 35    Imaging: No results found.  Assessment/Plan:   Principal Problem:   Hemoptysis Active Problems:  HTN (hypertension)   HLD (hyperlipidemia)   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HCC)   Malnutrition of moderate degree   Patient Summary: 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 Significantly decreased. Patient to follow up with Dr. Delton Coombes, his oncologist on 10/25. Hgb improved to9.3 from 8.6.  -discharge home with oncology follow up  Right Middle And Lower Lobe Consolidation  Patient continues to deny fever, shortness of breath, cough, chest pain. No signs or  symptoms of pneumonia. Gave patient instructions to watch for above symptoms or any other new symptoms that may be concerning for pneumonia and to follow up with PCP. Patient/wife agree to plan.  -continue to monitor -consider repeat imaging in 1-2 weeks -follow up outpatient with PCP  Diet: Tube feeds IVF: None,None VTE: SCDs Code: Full PT/OT recs: None, none.   Dispo: Anticipated discharge to Home today with follow up with his oncologist  [facility] Resident PGY-1 Pager [phone] Please contact the on call pager after 5 pm and on weekends at [phone].

## 2020-09-26 NOTE — Discharge Summary (Signed)
Name: Timothy Oconnor MRN: [id number] DOB: 1951-05-20 69 y.o. PCP: [name], MD  Date of Admission: 09/22/2020  9:19 AM Date of Discharge: 09/24/2020 Attending Physician: Dr. Angelia Mould  Discharge Diagnosis: Principal Problem:   Hemoptysis Active Problems:   HTN (hypertension)   HLD (hyperlipidemia)   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HCC)   Malnutrition of moderate degree    Discharge Medications: Allergies as of 09/24/2020   No Known Allergies     Medication List    TAKE these medications   ALPRAZolam 0.5 MG tablet Commonly known as: XANAX Take 1 tablet (0.5 mg total) by mouth 2 (two) times daily as needed for anxiety. What changed:   when to take this  additional instructions   aspirin EC 81 MG tablet Take 81 mg by mouth daily.   atorvastatin 80 MG tablet Commonly known as: LIPITOR Take 80 mg by mouth every evening.   CYRAMZA IV Inject into the vein as directed.   folic acid 401 MCG tablet Commonly known as: FOLVITE Take 800 mcg by mouth daily.   furosemide 20 MG tablet Commonly known as: LASIX Take 20 mg by mouth daily as needed for fluid or edema (only if Systolic number is 027 or greater).   furosemide 40 MG tablet Commonly known as: Lasix Take 1 tablet (40 mg total) by mouth daily as needed for fluid or edema.   isosorbide mononitrate 60 MG 24 hr tablet Commonly known as: IMDUR Take 60 mg by mouth daily.   metFORMIN 500 MG 24 hr tablet Commonly known as: GLUCOPHAGE-XR Take 500 mg by mouth daily as needed (for elevated BGL).   metoprolol tartrate 25 MG tablet Commonly known as: LOPRESSOR Take 0.5 tablets (12.5 mg total) by mouth 2 (two) times daily.   Misc. Devices Misc Please provide osmolite 1.5.  2 cartons four times daily via feeding tube (8 daily total).  Flush with 86ml water before and after administration of osmolite. What changed: additional instructions   Multi-Vitamins Tabs Take 1 tablet by mouth 2 (two) times  a week.   Oxycodone HCl 10 MG Tabs Take 1 tablet (10 mg total) by mouth at bedtime as needed. What changed: reasons to take this   tamsulosin 0.4 MG Caps capsule Commonly known as: FLOMAX Take 0.4 mg by mouth daily.   TAXOL IV Inject into the vein See admin instructions. Via IV once a week- three weeks on/1 week off   Zetia 10 MG tablet Generic drug: ezetimibe Take 10 mg by mouth daily.       Disposition and follow-up:   Timothy Oconnor was discharged from [facility] in Stable condition.  At the hospital follow up visit please address:  1.  Follow-up:  A. Esophageal Adenocarcinoma - Hemoptysis vs Hematemesis    B. Right Middle/Lower Lobe Consolidation   C. Right Sided Non-Reducible Inguinal Hernia  2.  Labs / imaging needed at time of follow-up: None  3.  Pending labs/ test needing follow-up: None  4.  Medication Changes: None  Follow-up Appointments: Oncologist, Dr. Delton Coombes 09/27/20, PCP  Hospital Course by problem list:  1) Esophageal Adenocarcinoma - Hemoptysis vs Hematemesis Patient was admitted from the emergency department with actively coughing/spitting up bright red blood with phlegm. Physical exam was unremarkable, g tube in place, when flushed coffee ground consistency was expressed. ENT and GI both consulted as difficult to assess whether the source of patient's bloody spit up was upper airway, lower airway, or gastrointestinal in nature. Flexible laryngoscopy was  performed and no source of bleeding was found. Chest CT was done, revealed thickening of esophageal cancer and increased mediastinal lymphadenopathy. As a result of this, an EGD was not performed due to procedure seen as high risk. Dr. Delton Coombes, patient's oncologist was reached out who believed the esophageal thickening was due to chemotherapy agent, not worsening cancer. The patient's coughing up spells improved, the consistency went from bright rood to dark colored spit up, they  decreased in frequency to the point the patient was no longer having episodes. The patient was observed an additional evening to assure his hemoglobin was recovering, it improved and he was discharged home in stable condition with a follow up appointment with his oncologist.   2.) Right Middle/Lower Lobe Consolidation Found on imaging and reported in radiologists impression. Patient was not reporting systemic symptoms of fever chills prior to his admission and continued to deny during his hospital course. He also denied cough, shortness of breath or chest pain prior to/during his hospital admission. Discussed with patient the need to follow up with PCP/oncologist and possibly have repeat imaging. Due to lack of symptoms, the patient was not started on antibiotics.   3.) Right Sided Non-Reducible Inguinal Hernia Patient stated that he initially had an umbilical hernia. After a fall, the umbilical hernia disappeared and the inguinal appeared. The area was non-tender on examination, but was not reducible upon my examination. Did not suspect incarceration or strangulation and did not believe it required urgent surgical consultation. Instructed patient to follow up with primary care provider.   Discharge Vitals:   BP 95/69   Pulse 84   Temp 97.7 F (36.5 C)   Resp 17   Ht 5\' 10"  (1.778 m)   Wt 77.6 kg   SpO2 97%   BMI 24.54 kg/m   Pertinent Labs, Studies, and Procedures:  CBC Latest Ref Rng & Units 09/24/2020 09/23/2020 09/22/2020  WBC 4.0 - 10.5 K/uL 4.2 4.1 5.0  Hemoglobin 13.0 - 17.0 g/dL 9.3(L) 8.6(L) 10.4(L)  Hematocrit 39 - 52 % 29.5(L) 27.3(L) 32.0(L)  Platelets 150 - 400 K/uL 418(H) 413(H) 456(H)    CMP Latest Ref Rng & Units 09/23/2020 09/22/2020 09/15/2020  Glucose 70 - 99 mg/dL 116(H) 223(H) 200(H)  BUN 8 - 23 mg/dL 18 23 23   Creatinine 0.61 - 1.24 mg/dL 0.72 0.68 0.74  Sodium 135 - 145 mmol/L 140 137 136  Potassium 3.5 - 5.1 mmol/L 3.7 3.4(L) 3.8  Chloride 98 - 111 mmol/L 104  99 98  CO2 22 - 32 mmol/L 24 25 26   Calcium 8.9 - 10.3 mg/dL 8.4(L) 8.7(L) 9.1  Total Protein 6.5 - 8.1 g/dL 5.7(L) 6.6 6.6  Total Bilirubin 0.3 - 1.2 mg/dL 0.5 0.4 0.3  Alkaline Phos 38 - 126 U/L 66 68 66  AST 15 - 41 U/L 28 34 27  ALT 0 - 44 U/L 32 38 35    DG Chest 1 View  Result Date: 09/22/2020 CLINICAL DATA:  Hematemesis.  History of stomach cancer EXAM: CHEST  1 VIEW COMPARISON:  Chest CT 06/09/2020 FINDINGS: Right pleural effusion which could be small or moderate. There is presumed right lower lobe opacity. Left subclavian porta catheter with tip at the SVC. Normal heart size. IMPRESSION: Right pleural effusion/lower lobe opacity, new from July 2021. Electronically Signed   By: Monte Fantasia M.D.   On: 09/22/2020 09:43   CT CHEST WO CONTRAST  Result Date: 09/22/2020 CLINICAL DATA:  Hemoptysis, history of esophageal and gastric cancer EXAM: CT CHEST  WITHOUT CONTRAST TECHNIQUE: Multidetector CT imaging of the chest was performed following the standard protocol without IV contrast. COMPARISON:  Chest x-ray from earlier in the same day, CT from 06/09/2020. FINDINGS: Cardiovascular: Somewhat limited due to lack of IV contrast. Aortic calcifications are noted. No aneurysmal dilatation is seen. No cardiac enlargement is noted. Heavy coronary calcifications are seen. Mediastinum/Nodes: Left chest wall port is noted extending into the superior vena cava. Thoracic inlet is within normal limits. 6 mm superior lymph node is noted adjacent to the brachiocephalic arteries slightly increased in size when compared to prior exam. Pre-vascular lymph nodes are noted also increased from the prior exam now measuring approximately 8 mm in short axis. AP window lymph node is noted measuring almost 14 mm in short axis. Subcarinal lymph node is noted measuring 16 mm in short axis. No sizable hilar lymph nodes are seen although the lack of contrast enhancement limits evaluation. The esophagus demonstrates  significant wall in the mid and distal portion consistent with the given clinical history. Filling defect is noted within as well. Lungs/Pleura: Left lung is well aerated without focal infiltrate. Mild basilar atelectasis is noted. Mild to moderate left pleural effusion is noted. More marked consolidation is noted in the right lower lobe and to a lesser degree in the right middle lobe consistent with pneumonia. No obstructing lesion is seen. Large right-sided pleural effusion is noted. The remainder of the right lung is within normal limits. Upper Abdomen: Visualized upper abdomen demonstrates gastrostomy catheter in place. Mild ascites is noted. Scattered lymphadenopathy is noted but less well visualized due to the lack of IV contrast. The omental changes seen previously are noted but to a lesser degree also related to the lack of IV contrast. Musculoskeletal: Degenerative changes of the thoracic spine are noted. No definitive lytic or sclerotic lesion is seen. No acute rib abnormality is noted. IMPRESSION: Increasing bilateral pleural effusions right significantly greater than left with associated consolidation in the right middle lobe and right lower lobe likely representing pneumonia. This may be related to aspiration. Significant increase in the degree of thickening of the mid and distal esophagus consistent with the patient's known history. This represents a significant increased when compared with July of 2021. Increase in mediastinal lymphadenopathy when compared with the prior exam although this may be reactive possibility of metastatic disease deserves consideration as well. Mild ascites improved from prior CT in August of 2021. The omental changes are again seen but to a lesser degree given the lack of IV contrast. Scattered lymphadenopathy is noted. Aortic Atherosclerosis (ICD10-I70.0). Electronically Signed   By: Inez Catalina M.D.   On: 09/22/2020 19:54     Discharge Instructions: Discharge  Instructions    Call MD for:  difficulty breathing, headache or visual disturbances   Complete by: As directed    Call MD for:  extreme fatigue   Complete by: As directed    Call MD for:  persistant dizziness or light-headedness   Complete by: As directed    Call MD for:  persistant nausea and vomiting   Complete by: As directed    Call MD for:  redness, tenderness, or signs of infection (pain, swelling, redness, odor or green/yellow discharge around incision site)   Complete by: As directed    Call MD for:  severe uncontrolled pain   Complete by: As directed    Call MD for:  temperature >100.4   Complete by: As directed    Diet - low sodium heart healthy  Complete by: As directed    Increase activity slowly   Complete by: As directed      Signed: Riesa Pope, MD 09/26/2020, 5:40 AM   Pager: [phone]

## 2020-09-26 NOTE — Progress Notes (Signed)
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  PRIOR THERAPY: FOLFOX and nivolumab x 14 cycles from 12/31/2019 to 07/26/2020  NGS Results: PD-L1 CPS 5%, Foundation 1 MS--stable, TMB 8 Muts/Mb  CURRENT THERAPY: Paclitaxel 3 weeks on, 1 week off; ramucirumab  BRIEF ONCOLOGIC HISTORY:  Oncology History  Esophageal cancer ([place])   Initial Diagnosis   Esophageal cancer ([place])    Genetic Testing   Foundation One and PDL-1 Results:       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[place])  12/24/2019 Initial Diagnosis   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25 mg (06/28/2020), 0.25 mg (07/12/2020), 0.25 mg (07/26/2020) leucovorin 868 mg in dextrose 5 % 250 mL infusion, 400 mg/m2 = 868 mg, Intravenous,  Once, 14 of 16 cycles Administration: 868 mg (12/31/2019), 868 mg (01/14/2020), 868 mg (01/28/2020), 868 mg (02/11/2020), 868 mg (02/25/2020), 868 mg (03/10/2020), 868 mg (03/24/2020), 868 mg (04/12/2020), 868 mg (04/26/2020), 868 mg (05/10/2020), 868 mg (05/24/2020), 868 mg (06/28/2020), 868 mg (07/12/2020), 868 mg (07/26/2020) oxaliplatin (ELOXATIN) 150 mg in dextrose 5 % 500 mL chemo infusion, 68 mg/m2 = 150 mg (80 %  of original dose 85 mg/m2), Intravenous,  Once, 13 of 15 cycles Dose modification: 68 mg/m2 (80 % of original dose 85 mg/m2, Cycle 1, Reason: Other (see comments), Comment: diabetic neuropathy) Administration: 150 mg (12/31/2019), 150 mg (01/14/2020), 150 mg (01/28/2020), 150 mg (02/11/2020), 150 mg (02/25/2020), 150 mg (03/10/2020), 150 mg (03/24/2020), 150 mg (04/12/2020), 150 mg (04/26/2020), 150 mg (05/10/2020), 150 mg (05/24/2020), 150 mg (06/28/2020), 150 mg (07/12/2020) fluorouracil (ADRUCIL) chemo injection 850 mg, 400 mg/m2 = 850 mg, Intravenous,  Once, 14 of 16 cycles Administration: 850 mg (12/31/2019), 850 mg (01/14/2020), 850 mg (01/28/2020), 850 mg (02/11/2020), 850 mg (02/25/2020), 850 mg (03/10/2020), 850 mg (03/24/2020), 850 mg (04/12/2020), 850 mg (04/26/2020), 850 mg (05/10/2020), 850 mg (05/24/2020), 850 mg (06/28/2020), 850 mg (07/26/2020) fluorouracil (ADRUCIL) 5,000 mg in sodium chloride 0.9 % 150 mL chemo infusion, 2,310 mg/m2 = 5,200 mg, Intravenous, 1 Day/Dose, 14 of 16 cycles Administration: 5,000 mg (12/31/2019), 5,000 mg (01/14/2020), 5,000 mg (01/28/2020), 5,000 mg (02/11/2020), 5,000 mg (02/25/2020), 5,000 mg (03/10/2020), 5,000 mg (03/24/2020), 5,000 mg (04/12/2020), 5,000 mg (04/26/2020), 5,000 mg (05/10/2020), 5,000 mg (05/24/2020), 5,200 mg (06/28/2020), 5,200 mg (07/26/2020) nivolumab (OPDIVO) 240 mg in sodium chloride 0.9 % 100 mL chemo infusion, 240 mg (100 % of original dose 240 mg), Intravenous, Once, 14 of 16 cycles Dose modification: 240 mg (original dose 240 mg, Cycle 1, Reason: Provider Judgment) Administration: 240 mg (12/31/2019), 240 mg (01/14/2020), 240 mg (01/28/2020), 240 mg (02/11/2020), 240 mg (02/25/2020), 240 mg (03/10/2020), 240 mg (03/24/2020), 240 mg (04/12/2020), 240 mg (04/26/2020), 240 mg (05/10/2020), 240 mg (05/24/2020), 240 mg (06/28/2020), 240 mg (07/12/2020), 240 mg (  07/26/2020)  for chemotherapy treatment.     [place] One and PDL-1 Results:       08/11/2020 -  Chemotherapy    The patient had PACLitaxel (TAXOL) 168 mg in sodium chloride 0.9 % 250 mL chemo infusion (</= 48m/m2), 80 mg/m2 = 168 mg, Intravenous,  Once, 2 of 6 cycles Dose modification: 64 mg/m2 (80 % of original dose 80 mg/m2, Cycle 2, Reason: Other (see comments), Comment: neuropathy) Administration: 168 mg (08/11/2020), 168 mg (08/20/2020), 168 mg (08/25/2020), 138 mg (09/08/2020), 138 mg (09/15/2020) ramucirumab (CYRAMZA) 700 mg in sodium chloride 0.9 % 180 mL chemo infusion, 8 mg/kg = 700 mg, Intravenous, Once, 2 of 6 cycles Administration: 700 mg (08/11/2020), 700 mg (08/25/2020), 700 mg (09/08/2020)  for chemotherapy treatment.      CANCER STAGING: Cancer Staging 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[facility] Staging form: Esophagus - Adenocarcinoma, AJCC 8th Edition - Clinical stage from 12/29/2019: Stage IVB (cTX, cN3, cM1) - Signed by KDerek Jack MD on 12/29/2019   INTERVAL HISTORY:  Mr. RIsaiha Oconnor a 69y.o. male who presents for an acute add-on to discuss his recent hospitalization, imaging and cancer care moving forward.  He was last seen by Dr. KDelton Coombeson 09/08/2020 prior to cycle two of paclitaxel and ramucirumab.  He appeared to be doing fairly well.  He was scheduled for a paracentesis on 09/09/2020 for recurrent ascites.  He was also set up for an outpatient swallow study.  They proceeded with treatment.  He had 2.2 L of fluid removed on 09/09/2020.  Had ultrasound on 09/16/2020 for possible paracentesis which was not performed secondary to lack of fluid.  He presented to the emergency room on 09/22/2020 for hemoptysis.  [facility]  Had bedside Laryngoscopy where trace amount of blood was found around the esophageal inlet.  No active bleeding but hemoptysis thought to be secondary to esophageal cancer.  Chest CT was performed showing thickening of esophageal cancer and increased mediastinal lymphadenopathy.  EGD was not performed due to  high risk.  Dr. KDelton Coombeswas consulted and reviewed his imaging who believed his esophageal thickening was due to chemotherapy not worsening of his cancer.  His hemoptysis improved and blood work was stable.  He was discharged home.  Incidentally found to have nonreducible inguinal hernia.  No incarceration or strangulation.  He was instructed to follow-up outpatient.  Today he presents for follow-up, review imaging plan of care moving forward.  His wife states he is not eating or drinking well and is unable to get adequate nutrients secondary to bloating and early satiety.  Previously using PEG tube (6-7 cans of Osmolite daily) and was able to drink some liquids.  He had been drinking high-protein shakes with tolerance.  They have noticed some increased drainage from around his gtube.  He is nauseated but denies any vomiting.  Hemoptysis has resolved.  He has very little energy.  He feels dizzy and short of breath.  He has chronic stable numbness and tingling in fingertips and toes.  He reports feeling really weak.  Denies abdominal discomfort at this time.   REVIEW OF SYSTEMS:  Review of Systems  Constitutional: Positive for appetite change, fatigue and unexpected weight change. Negative for chills and fever.  HENT:  Negative.  Negative for hearing loss, lump/mass, mouth sores and nosebleeds.   Eyes: Negative.  Negative for eye problems.  Respiratory: Positive for hemoptysis and shortness of breath. Negative  for cough.   Cardiovascular: Negative.  Negative for chest pain and leg swelling.  Gastrointestinal: Positive for abdominal pain and nausea. Negative for blood in stool, constipation, diarrhea and vomiting.  Endocrine: Negative.  Negative for hot flashes.  Genitourinary: Negative.  Negative for bladder incontinence, difficulty urinating, dysuria, frequency and hematuria.   Musculoskeletal: Positive for gait problem. Negative for back pain, flank pain and myalgias.  Skin: Negative.  Negative  for itching and rash.  Neurological: Positive for dizziness, gait problem and numbness. Negative for headaches and light-headedness.  Hematological: Negative.  Negative for adenopathy.  Psychiatric/Behavioral: Negative for confusion. The patient is not nervous/anxious.     PAST MEDICAL/SURGICAL HISTORY:  Past Medical History:  Diagnosis Date  . 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[place])   . Anxiety   . Diabetes ([place])   . Heart attack ([place])   . Heart disease   . High cholesterol   . Hyperplasia of prostate   . Hypertension   . Low back pain   . Port-A-Cath in place 12/30/2019  . Tremor    Past Surgical History:  Procedure Laterality Date  . arterectomy     1993  . BIOPSY  12/01/2019   Procedure: BIOPSY;  Surgeon: Rogene Houston, MD;  Location: [facility];  Service: Endoscopy;;  esophagus  . ESOPHAGOGASTRODUODENOSCOPY (EGD) WITH PROPOFOL N/A 12/01/2019   Procedure: ESOPHAGOGASTRODUODENOSCOPY (EGD) WITH PROPOFOL;  Surgeon: Rogene Houston, MD;  Location: [facility];  Service: Endoscopy;  Laterality: N/A;  . ESOPHAGOGASTRODUODENOSCOPY (EGD) WITH PROPOFOL N/A 12/24/2019   Procedure: ESOPHAGOGASTRODUODENOSCOPY (EGD) WITH PROPOFOL;  Surgeon: Aviva Signs, MD;  Location: [facility] ORS;  Service: General;  Laterality: N/A;  . Heart stents     2002, 2004, 2006  . PEG PLACEMENT N/A 12/24/2019   Procedure: PERCUTANEOUS ENDOSCOPIC GASTROSTOMY (PEG) PLACEMENT;  Surgeon: Aviva Signs, MD;  Location: [facility] ORS;  Service: General;  Laterality: N/A;  . PORTACATH PLACEMENT Left 12/24/2019   Procedure: INSERTION PORT-A-CATH;  Surgeon: Aviva Signs, MD;  Location: [facility] ORS;  Service: General;  Laterality: Left;    SOCIAL HISTORY:  Social History   Socioeconomic History  . Marital status: Married    Spouse name: Not on file  . Number of children: 2  . Years of education: 20  . Highest education level: Not on file  Occupational History  . Occupation: [employer]    Comment: Delivers  medication  Tobacco Use  . Smoking status: Former Smoker    Packs/day: 1.00    Years: 50.00    Pack years: 50.00    Quit date: 12/21/2017    Years since quitting: 2.7  . Smokeless tobacco: Never Used  . Tobacco comment: Quit 2017  Vaping Use  . Vaping Use: Never used  Substance and Sexual Activity  . Alcohol use: Yes    Alcohol/week: 0.0 standard drinks    Comment: 3 drinks per week  . Drug use: No  . Sexual activity: Yes  Other Topics Concern  . Not on file  Social History Narrative   Lives at home with his wife.   Right-handed.   2 diet cokes per day.   Social Determinants of Health   Financial Resource Strain: Low Risk   . Difficulty of Paying Living Expenses: Not hard at all  Food Insecurity: No Food Insecurity  . Worried About Charity fundraiser in the Last Year: Never true  . Ran Out of Food in the Last Year: Never true  Transportation Needs: No Transportation Needs  .  Lack of Transportation (Medical): No  . Lack of Transportation (Non-Medical): No  Physical Activity: Insufficiently Active  . Days of Exercise per Week: 2 days  . Minutes of Exercise per Session: 30 min  Stress: No Stress Concern Present  . Feeling of Stress : Only a little  Social Connections: Socially Integrated  . Frequency of Communication with Friends and Family: More than three times a week  . Frequency of Social Gatherings with Friends and Family: More than three times a week  . Attends Religious Services: 1 to 4 times per year  . Active Member of Clubs or Organizations: No  . Attends Archivist Meetings: 1 to 4 times per year  . Marital Status: Married  Human resources officer Violence: Not At Risk  . Fear of Current or Ex-Partner: No  . Emotionally Abused: No  . Physically Abused: No  . Sexually Abused: No    FAMILY HISTORY:  Family History  Problem Relation Age of Onset  . Alzheimer's disease Mother   . Diabetes Father   . Heart disease Father   . Stroke Father   . Heart  disease Brother   . Colon cancer Brother   . Alzheimer's disease Sister     CURRENT MEDICATIONS:  Current Outpatient Medications  Medication Sig Dispense Refill  . ALPRAZolam (XANAX) 0.5 MG tablet Take 1 tablet (0.5 mg total) by mouth 2 (two) times daily as needed for anxiety. (Patient taking differently: Take 0.5 mg by mouth See admin instructions. Take 0.5 mg by mouth at bedtime and an additional 0.5 mg once a day as needed for anxiety) 30 tablet 0  . aspirin EC 81 MG tablet Take 81 mg by mouth daily.     Marland Kitchen atorvastatin (LIPITOR) 80 MG tablet Take 80 mg by mouth every evening.     . ezetimibe (ZETIA) 10 MG tablet Take 10 mg by mouth daily.     . folic acid (FOLVITE) 235 MCG tablet Take 800 mcg by mouth daily.     . furosemide (LASIX) 20 MG tablet Take 20 mg by mouth daily as needed for fluid or edema (only if Systolic number is 573 or greater).     . furosemide (LASIX) 40 MG tablet Take 1 tablet (40 mg total) by mouth daily as needed for fluid or edema. 30 tablet 3  . isosorbide mononitrate (IMDUR) 60 MG 24 hr tablet Take 60 mg by mouth daily.    . metFORMIN (GLUCOPHAGE-XR) 500 MG 24 hr tablet Take 500 mg by mouth daily as needed (for elevated BGL).     Marland Kitchen metoprolol tartrate (LOPRESSOR) 25 MG tablet Take 0.5 tablets (12.5 mg total) by mouth 2 (two) times daily. 60 tablet 1  . Misc. Devices MISC Please provide osmolite 1.5.  2 cartons four times daily via feeding tube (8 daily total).  Flush with 55m water before and after administration of osmolite. (Patient taking differently: Osmolite 1.5.- 1-2 cartons three times daily via feeding tube (2-6 daily total).  Flush with 60 ml's water before and after administration of Osmolite.) 1 each 0  . Multiple Vitamin (MULTI-VITAMINS) TABS Take 1 tablet by mouth 2 (two) times a week.     . Oxycodone HCl 10 MG TABS Take 1 tablet (10 mg total) by mouth at bedtime as needed. (Patient taking differently: Take 10 mg by mouth at bedtime as needed (for pain). )  30 tablet 0  . PACLitaxel (TAXOL IV) Inject into the vein See admin instructions. Via IV once a  week- three weeks on/1 week off    . Ramucirumab (CYRAMZA IV) Inject into the vein as directed.     . tamsulosin (FLOMAX) 0.4 MG CAPS capsule Take 0.4 mg by mouth daily.     Marland Kitchen amoxicillin-clavulanate (AUGMENTIN) 875-125 MG tablet Take 1 tablet by mouth 2 (two) times daily. 14 tablet 0  . metoCLOPramide (REGLAN) 10 MG tablet Take 1 tablet (10 mg total) by mouth 4 (four) times daily. 120 tablet 0  . oxyCODONE (ROXICODONE) 5 MG/5ML solution Take 5 mLs (5 mg total) by mouth every 8 (eight) hours as needed for severe pain. 450 mL 0  . scopolamine (TRANSDERM-SCOP) 1 MG/3DAYS Place 1 patch (1.5 mg total) onto the skin every 3 (three) days. 10 patch 12   No current facility-administered medications for this visit.    ALLERGIES:  No Known Allergies  PHYSICAL EXAM:  Performance status (ECOG): 1 - Symptomatic but completely ambulatory  Vitals:   09/27/20 0832  BP: 97/67  Pulse: (!) 107  Resp: (!) 21  Temp: (!) 97.1 F (36.2 C)  SpO2: 99%   Wt Readings from Last 3 Encounters:  09/27/20 170 lb 9.6 oz (77.4 kg)  09/22/20 171 lb (77.6 kg)  09/15/20 176 lb 6.4 oz (80 kg)   Physical Exam Vitals reviewed.  Constitutional:      Appearance: Normal appearance.  Cardiovascular:     Rate and Rhythm: Normal rate and regular rhythm.     Pulses: Normal pulses.     Heart sounds: Normal heart sounds.  Pulmonary:     Effort: Pulmonary effort is normal.     Breath sounds: Normal breath sounds.  Chest:     Comments: Port-a-Cath in R chest Abdominal:     General: There is no distension.     Palpations: Abdomen is soft.     Tenderness: There is no abdominal tenderness.     Comments: PEG tube  Musculoskeletal:     Right lower leg: No edema.     Left lower leg: No edema.  Neurological:     General: No focal deficit present.     Mental Status: He is alert and oriented to person, place, and time.      Motor: Weakness (R foot drop) present.  Psychiatric:        Mood and Affect: Mood normal.        Behavior: Behavior normal.     LABORATORY DATA:  I have reviewed the labs as listed.  CBC Latest Ref Rng & Units 09/27/2020 09/24/2020 09/23/2020  WBC 4.0 - 10.5 K/uL 6.2 4.2 4.1  Hemoglobin 13.0 - 17.0 g/dL 11.1(L) 9.3(L) 8.6(L)  Hematocrit 39 - 52 % 35.3(L) 29.5(L) 27.3(L)  Platelets 150 - 400 K/uL 561(H) 418(H) 413(H)   CMP Latest Ref Rng & Units 09/27/2020 09/23/2020 09/22/2020  Glucose 70 - 99 mg/dL 128(H) 116(H) 223(H)  BUN 8 - 23 mg/dL 28(H) 18 23  Creatinine 0.61 - 1.24 mg/dL 0.97 0.72 0.68  Sodium 135 - 145 mmol/L 138 140 137  Potassium 3.5 - 5.1 mmol/L 3.6 3.7 3.4(L)  Chloride 98 - 111 mmol/L 102 104 99  CO2 22 - 32 mmol/L _0 Calcium 8.9 - 10.3 mg/dL 8.4(L) 8.4(L) 8.7(L)  Total Protein 6.5 - 8.1 g/dL 6.6 5.7(L) 6.6  Total Bilirubin 0.3 - 1.2 mg/dL 0.5 0.5 0.4  Alkaline Phos 38 - 126 U/L 91 66 68  AST 15 - 41 U/L 27 28 34  ALT 0 - 44 U/L 32 32  38    DIAGNOSTIC IMAGING:  I have independently reviewed the scans and discussed with the patient. DG Chest 1 View  Result Date: 09/22/2020 CLINICAL DATA:  Hematemesis.  History of stomach cancer EXAM: CHEST  1 VIEW COMPARISON:  Chest CT 06/09/2020 FINDINGS: Right pleural effusion which could be small or moderate. There is presumed right lower lobe opacity. Left subclavian porta catheter with tip at the SVC. Normal heart size. IMPRESSION: Right pleural effusion/lower lobe opacity, new from July 2021. Electronically Signed   By: Monte Fantasia M.D.   On: 09/22/2020 09:43   CT CHEST WO CONTRAST  Result Date: 09/22/2020 CLINICAL DATA:  Hemoptysis, history of esophageal and gastric cancer EXAM: CT CHEST WITHOUT CONTRAST TECHNIQUE: Multidetector CT imaging of the chest was performed following the standard protocol without IV contrast. COMPARISON:  Chest x-ray from earlier in the same day, CT from 06/09/2020. FINDINGS:  Cardiovascular: Somewhat limited due to lack of IV contrast. Aortic calcifications are noted. No aneurysmal dilatation is seen. No cardiac enlargement is noted. Heavy coronary calcifications are seen. Mediastinum/Nodes: Left chest wall port is noted extending into the superior vena cava. Thoracic inlet is within normal limits. 6 mm superior lymph node is noted adjacent to the brachiocephalic arteries slightly increased in size when compared to prior exam. Pre-vascular lymph nodes are noted also increased from the prior exam now measuring approximately 8 mm in short axis. AP window lymph node is noted measuring almost 14 mm in short axis. Subcarinal lymph node is noted measuring 16 mm in short axis. No sizable hilar lymph nodes are seen although the lack of contrast enhancement limits evaluation. The esophagus demonstrates significant wall in the mid and distal portion consistent with the given clinical history. Filling defect is noted within as well. Lungs/Pleura: Left lung is well aerated without focal infiltrate. Mild basilar atelectasis is noted. Mild to moderate left pleural effusion is noted. More marked consolidation is noted in the right lower lobe and to a lesser degree in the right middle lobe consistent with pneumonia. No obstructing lesion is seen. Large right-sided pleural effusion is noted. The remainder of the right lung is within normal limits. Upper Abdomen: Visualized upper abdomen demonstrates gastrostomy catheter in place. Mild ascites is noted. Scattered lymphadenopathy is noted but less well visualized due to the lack of IV contrast. The omental changes seen previously are noted but to a lesser degree also related to the lack of IV contrast. Musculoskeletal: Degenerative changes of the thoracic spine are noted. No definitive lytic or sclerotic lesion is seen. No acute rib abnormality is noted. IMPRESSION: Increasing bilateral pleural effusions right significantly greater than left with  associated consolidation in the right middle lobe and right lower lobe likely representing pneumonia. This may be related to aspiration. Significant increase in the degree of thickening of the mid and distal esophagus consistent with the patient's known history. This represents a significant increased when compared with July of 2021. Increase in mediastinal lymphadenopathy when compared with the prior exam although this may be reactive possibility of metastatic disease deserves consideration as well. Mild ascites improved from prior CT in August of 2021. The omental changes are again seen but to a lesser degree given the lack of IV contrast. Scattered lymphadenopathy is noted. Aortic Atherosclerosis (ICD10-I70.0). Electronically Signed   By: Inez Catalina M.D.   On: 09/22/2020 19:54   US PELVIS LIMITED (TRANSABDOMINAL ONLY)  Result Date: 09/17/2020 CLINICAL DATA:  Right groin pain and swelling after fall yesterday. EXAM: ULTRASOUND OF  RIGHT GROIN SOFT TISSUES TECHNIQUE: Ultrasound examination of the groin soft tissues was performed in the area of clinical concern. COMPARISON:  CT abdomen pelvis dated August 03, 2020. FINDINGS: New right inguinal hernia containing bowel, measuring approximately 3.2 cm. IMPRESSION: 1. New right inguinal hernia containing bowel. Electronically Signed   By: Titus Dubin M.D.   On: 09/17/2020 12:21   CT CHEST ABDOMEN PELVIS W CONTRAST  Result Date: 09/27/2020 CLINICAL DATA:  History of metastatic adenocarcinoma they GE junction EXAM: CT CHEST, ABDOMEN, AND PELVIS WITH CONTRAST TECHNIQUE: Multidetector CT imaging of the chest, abdomen and pelvis was performed following the standard protocol during bolus administration of intravenous contrast. CONTRAST:  172m OMNIPAQUE IOHEXOL 300 MG/ML  SOLN COMPARISON:  Chest CT 09/22/2020 and CT abdomen/pelvis 08/03/2020 and CT chest, abdomen and pelvis 06/09/2020. FINDINGS: CT CHEST FINDINGS Cardiovascular: The heart is normal in size. No  pericardial effusion. The aorta is normal in caliber. Stable atherosclerotic calcifications. No dissection. Stable significant three-vessel coronary artery calcifications. Mediastinum/Nodes: Progressive mediastinal lymphadenopathy when compared to the prior examination from 06/09/2020. 9 mm prevascular node on image 24/2 previously measured 5 mm. 15 mm AP window node on image 26/2 previously measured 9.5 mm. 21 mm subcarinal node on image 29/2 previously measured 13 mm. Upper anterior mediastinal nodes have also enlarged. Contrast noted in the esophagus. The GE junction mass is not well demonstrated. Lungs/Pleura: Moderate-sized bilateral pleural effusions with overlying atelectasis. No pulmonary nodules to suggest pulmonary metastatic disease. I do not see any obvious enhancing pleural nodules but I would certainly be worried about pleural spread of tumor. Bilateral pleural effusions Musculoskeletal: No chest wall mass. Small bilateral supraclavicular nodes. The thyroid gland is unremarkable. The left Port-A-Cath is stable. No bony lesions are identified. CT ABDOMEN PELVIS FINDINGS Hepatobiliary: No intrahepatic metastatic lesions are identified. Moderate perihepatic fluid. The gallbladder is grossly normal. No common bile duct dilatation. Pancreas: No mass, inflammation or ductal dilatation. Spleen: Normal size.  No focal lesions. Adrenals/Urinary Tract: The adrenal glands are unremarkable and stable. Stable left renal cyst. No worrisome renal lesions or bladder lesions. High attenuation material in the right side of the bladder is likely some contrast material. Stomach/Bowel: The stomach, duodenum, small bowel and colon are grossly normal. No obstructive findings or mass lesions. Feeding gastrostomy tube is noted in good position in the stomach. Vascular/Lymphatic: Advanced atherosclerotic calcifications involving the aorta and branch vessels but no dissection. The major venous structures are patent. There is a  thick irregular rim of enhancing soft tissue mainly around the diaphragm and subdiaphragmatic. Any Um consistent with peritoneal carcinomatosis. There is also enhancing omental tumor this has a maximum thickness of 27 mm in the right upper quadrant. This is previously 41 mm. Numerous enlarged mesenteric lymph nodes likely reflecting metastatic disease and showing slight enlargement since the prior study although some of these were obscured by fluid on that study. Small scattered retroperitoneal lymph nodes are slightly larger. Small to moderate volume abdominal/pelvic ascites Reproductive: Stable enlarged prostate gland. Moderate right-sided hydrocele and slight scrotal wall thickening. Other: Diffuse predominantly right-sided body wall edema. Musculoskeletal: No significant bony findings. IMPRESSION: 1. Overall progression of metastatic disease with enlarging mediastinal lymph nodes, progressive bilateral pleural effusions, progressive peritoneal carcinomatosis and progressive mesenteric and retroperitoneal lymph nodes. 2. I do not see any obvious residual tumor at the GE junction. Slightly smaller gastrohepatic ligament lymph node when compared to August 2021. 3. Stable advanced atherosclerotic disease involving the thoracic and abdominal aorta and branch vessels. 4. Small  to moderate volume abdominal/pelvic ascites. 5. Stable prostate gland enlargement. Electronically Signed   By: Marijo Sanes M.D.   On: 09/27/2020 16:47   US Paracentesis  Result Date: 09/09/2020 INDICATION: Adenocarcinoma of the gastroesophageal junction, malignant ascites EXAM: ULTRASOUND GUIDED THERAPEUTIC PARACENTESIS MEDICATIONS: None COMPLICATIONS: None immediate PROCEDURE: Informed written consent was obtained from the patient after a discussion of the risks, benefits and alternatives to treatment. A timeout was performed prior to the initiation of the procedure. Initial ultrasound scanning demonstrates a moderate amount of ascites  within the right lower abdominal quadrant. The right lower abdomen was prepped and draped in the usual sterile fashion. 1% lidocaine was used for local anesthesia. Following this, a 5 [place] Yueh catheter was introduced. An ultrasound image was saved for documentation purposes. The paracentesis was performed. The catheter was removed and a dressing was applied. The patient tolerated the procedure well without immediate post procedural complication. FINDINGS: A total of approximately 2.2 L of yellow ascitic fluid was removed. IMPRESSION: Successful ultrasound-guided paracentesis yielding 2.2 liters of peritoneal fluid. Electronically Signed   By: Lavonia Dana M.D.   On: 09/09/2020 12:40   [place] ASCITES (ABDOMEN LIMITED)  Result Date: 09/27/2020 CLINICAL DATA:  Adenocarcinoma of the gastroesophageal junction, ascites EXAM: LIMITED ABDOMEN ULTRASOUND FOR ASCITES TECHNIQUE: Limited ultrasound survey for ascites was performed in all four abdominal quadrants. COMPARISON:  Ultrasound abdomen 09/16/2020 FINDINGS: Small amount of ascites is identified, predominantly perihepatic. No adequate collection of ascites is identified in the lower quadrants to warrant paracentesis. IMPRESSION: Insufficient ascites for paracentesis. Electronically Signed   By: Lavonia Dana M.D.   On: 09/27/2020 14:26   [place] ASCITES (ABDOMEN LIMITED)  Result Date: 09/17/2020 CLINICAL DATA:  Ascites. EXAM: LIMITED ABDOMEN ULTRASOUND FOR ASCITES TECHNIQUE: Limited ultrasound survey for ascites was performed in all four abdominal quadrants. COMPARISON:  August 18, 2020. FINDINGS: No ascites identified upon survey imaging of the abdomen. Paracentesis not performed. IMPRESSION: 1. No ascites. Electronically Signed   By: Titus Dubin M.D.   On: 09/17/2020 12:23     ASSESSMENT:  1. 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 HER-2 FISH to be followed up. PD-L1 CPS-5%. -FOLFOX and Opdivo started on 12/31/2019. -PET  scan on 03/22/2020 showed marked reduction in size and metabolic activity in the distal esophageal lesion, marked reduction in size of mediastinal adenopathy. Near complete resolution with only 2 small mildly metabolic mediastinal lymph nodes remaining. Resolution of lymph nodes in the upper abdomen. Right hip bone lesion is still present but better. -CT CAP on 06/09/2020 showed interval resolution of previously demonstrated tree-in-bud nodularity in the lungs. Stable enlarged subcarinal lymph node and small residual lymph nodes with the gastrohepatic ligament and porta hepatis unchanged from the most recent PET scan. -CTAP on 08/03/2020 shows interval development of extensive omental caking particularly in the right upper quadrant with a more conglomerate soft tissue attenuation and large volume ascites. Irregular thickening in the distal esophagus incompletely assessed. Enlarging subcarinal and gastrohepatic nodes. -Paclitaxel and Cyramza started on 08/11/2020. -Was hospitalized for hemoptysis on 09/22/2020-09/24/2020.  Bedside laryngoscopy showed trace amount of blood at the esophageal inlet.  No active bleed. -CT scan 09/22/2020 showed increase in thickening of distal esophagus concerning for worsening malignancy and increasing mediastinal lymphadenopathy.  This was thought to be secondary to cancer treatment not worsening disease.  2.  Left vocal cord paralysis: -Status post left medialization laryngoplasty using Silastic implant done on 08/19/2020 at [facility].   PLAN:  1. Stage IV GE junction adenocarcinoma: -Reviewed hospital records. -Reviewed most recent lab work.  LFTs and white count normal.  Albumin is low at 2.8. --Repeat CT C/A/P per Dr. Delton Coombes today. -He will return to clinic on 09/30/2020 for results of imaging and further cancer planning.  2. Hoarseness of voice: -His hoarseness improved after the procedure on 08/19/2020.  3.Weight loss: -He is unable to  consume enough nutrition at this time secondary to early satiety and bloating of his abdomen and dysphagia. -We will reach out to our dietitian for guidance.  4. Bone metastasis: -Continue monthly Zometa.  5. Diabetes: -Continue Metformin.  6. Hypertension: -Blood pressure is 103/71.  Continue to hold Lopressor.  7.Anxiety: -Continue Xanax as needed.  8. Ascites: -Last paracentesis was completed on 09/09/2020 where 2.2 L of fluid was removed.  Repeat ultrasound on 09/16/2020 was negative for fluid reaccumulation.  -Having bloating: We will get him set up for repeat ultrasound with possible paracentesis today.  9. Right foot drop: -He completed physical therapy.  He will be getting a brace soon.  10.  Dehydration secondary to malnutrition: -Albumin 2.8 today. -He is unable to consume adequate nutrition secondary to bloating and abdominal discomfort along with dysphagia. -He will be set up for paracentesis today along with imaging for evaluation. -He will get a liter of normal saline while in clinic along with 10 mg dexamethasone and 8 mg Zofran. -We will reach out to dietary.  11.  Nausea: -Continue antiemetics as prescribed. -IV Zofran and dexamethasone while in clinic today.  Disposition: -Paracentesis today. -CT chest abdomen pelvis today. -RTC on 09/22/2020 for follow-up with Dr. Delton Coombes.   Orders placed this encounter:  No orders of the defined types were placed in this encounter.  Greater than 50% was spent in counseling and coordination of care with this patient including but not limited to discussion of the relevant topics above (See A&P) including, but not limited to diagnosis and management of acute and chronic medical conditions.   Faythe Casa, NP 09/30/2020 8:20 AM

## 2020-09-27 ENCOUNTER — Inpatient Hospital Stay (HOSPITAL_BASED_OUTPATIENT_CLINIC_OR_DEPARTMENT_OTHER): Payer: Medicare HMO | Admitting: Hematology

## 2020-09-27 ENCOUNTER — Ambulatory Visit (HOSPITAL_COMMUNITY)
Admission: RE | Admit: 2020-09-27 | Discharge: 2020-09-27 | Disposition: A | Discharge status: Home / Self Care | Payer: Medicare HMO | Source: Ambulatory Visit | Attending: Hematology | Admitting: Hematology

## 2020-09-27 ENCOUNTER — Inpatient Hospital Stay (HOSPITAL_COMMUNITY): Payer: Medicare HMO

## 2020-09-27 ENCOUNTER — Other Ambulatory Visit: Payer: Self-pay

## 2020-09-27 ENCOUNTER — Other Ambulatory Visit (HOSPITAL_COMMUNITY): Payer: Self-pay | Admitting: Hematology

## 2020-09-27 ENCOUNTER — Other Ambulatory Visit (HOSPITAL_COMMUNITY): Payer: Self-pay | Admitting: *Deleted

## 2020-09-27 ENCOUNTER — Encounter (HOSPITAL_COMMUNITY): Payer: Self-pay

## 2020-09-27 VITALS — BP 97/67 | HR 107 | Temp 97.1°F | Resp 21 | Wt 170.6 lb

## 2020-09-27 DIAGNOSIS — R110 Nausea: Secondary | ICD-10-CM | POA: Diagnosis not present

## 2020-09-27 DIAGNOSIS — E119 Type 2 diabetes mellitus without complications: Secondary | ICD-10-CM | POA: Diagnosis not present

## 2020-09-27 DIAGNOSIS — J3801 Paralysis of vocal cords and larynx, unilateral: Secondary | ICD-10-CM | POA: Diagnosis not present

## 2020-09-27 DIAGNOSIS — C160 Malignant neoplasm of cardia: Secondary | ICD-10-CM

## 2020-09-27 DIAGNOSIS — E860 Dehydration: Secondary | ICD-10-CM

## 2020-09-27 DIAGNOSIS — C7951 Secondary malignant neoplasm of bone: Secondary | ICD-10-CM | POA: Diagnosis not present

## 2020-09-27 DIAGNOSIS — R634 Abnormal weight loss: Secondary | ICD-10-CM | POA: Diagnosis not present

## 2020-09-27 DIAGNOSIS — Z95828 Presence of other vascular implants and grafts: Secondary | ICD-10-CM

## 2020-09-27 DIAGNOSIS — Z5189 Encounter for other specified aftercare: Secondary | ICD-10-CM | POA: Diagnosis not present

## 2020-09-27 DIAGNOSIS — R188 Other ascites: Secondary | ICD-10-CM | POA: Diagnosis not present

## 2020-09-27 DIAGNOSIS — E440 Moderate protein-calorie malnutrition: Secondary | ICD-10-CM

## 2020-09-27 DIAGNOSIS — R490 Dysphonia: Secondary | ICD-10-CM | POA: Diagnosis not present

## 2020-09-27 DIAGNOSIS — Z7984 Long term (current) use of oral hypoglycemic drugs: Secondary | ICD-10-CM | POA: Diagnosis not present

## 2020-09-27 DIAGNOSIS — I10 Essential (primary) hypertension: Secondary | ICD-10-CM | POA: Diagnosis not present

## 2020-09-27 DIAGNOSIS — Z79899 Other long term (current) drug therapy: Secondary | ICD-10-CM | POA: Diagnosis not present

## 2020-09-27 DIAGNOSIS — Z5111 Encounter for antineoplastic chemotherapy: Secondary | ICD-10-CM | POA: Diagnosis present

## 2020-09-27 LAB — CBC WITH DIFFERENTIAL/PLATELET
Abs Immature Granulocytes: 0.03 10*3/uL (ref 0.00–0.07)
Basophils Absolute: 0 10*3/uL (ref 0.0–0.1)
Basophils Relative: 1 %
Eosinophils Absolute: 0 10*3/uL (ref 0.0–0.5)
Eosinophils Relative: 0 %
HCT: 35.3 % — ABNORMAL LOW (ref 39.0–52.0)
Hemoglobin: 11.1 g/dL — ABNORMAL LOW (ref 13.0–17.0)
Immature Granulocytes: 1 %
Lymphocytes Relative: 21 %
Lymphs Abs: 1.3 10*3/uL (ref 0.7–4.0)
MCH: 29.5 pg (ref 26.0–34.0)
MCHC: 31.4 g/dL (ref 30.0–36.0)
MCV: 93.9 fL (ref 80.0–100.0)
Monocytes Absolute: 0.8 10*3/uL (ref 0.1–1.0)
Monocytes Relative: 13 %
Neutro Abs: 4 10*3/uL (ref 1.7–7.7)
Neutrophils Relative %: 64 %
Platelets: 561 10*3/uL — ABNORMAL HIGH (ref 150–400)
RBC: 3.76 MIL/uL — ABNORMAL LOW (ref 4.22–5.81)
RDW: 18.5 % — ABNORMAL HIGH (ref 11.5–15.5)
WBC: 6.2 10*3/uL (ref 4.0–10.5)
nRBC: 0 % (ref 0.0–0.2)

## 2020-09-27 LAB — COMPREHENSIVE METABOLIC PANEL
ALT: 32 U/L (ref 0–44)
AST: 27 U/L (ref 15–41)
Albumin: 2.8 g/dL — ABNORMAL LOW (ref 3.5–5.0)
Alkaline Phosphatase: 91 U/L (ref 38–126)
Anion gap: 14 (ref 5–15)
BUN: 28 mg/dL — ABNORMAL HIGH (ref 8–23)
CO2: 22 mmol/L (ref 22–32)
Calcium: 8.4 mg/dL — ABNORMAL LOW (ref 8.9–10.3)
Chloride: 102 mmol/L (ref 98–111)
Creatinine, Ser: 0.97 mg/dL (ref 0.61–1.24)
GFR, Estimated: >60 mL/min (ref >60)
Glucose, Bld: 128 mg/dL — ABNORMAL HIGH (ref 70–99)
Potassium: 3.6 mmol/L (ref 3.5–5.1)
Sodium: 138 mmol/L (ref 135–145)
Total Bilirubin: 0.5 mg/dL (ref 0.3–1.2)
Total Protein: 6.6 g/dL (ref 6.5–8.1)

## 2020-09-27 MED ORDER — IOHEXOL 9 MG/ML PO SOLN
ORAL | Status: AC
  Filled 2020-09-27: qty 1000

## 2020-09-27 MED ORDER — SODIUM CHLORIDE 0.9 % IV SOLN
Freq: Once | INTRAVENOUS | Status: AC
  Administered 2020-09-27: 8 mg via INTRAVENOUS
  Filled 2020-09-27: qty 4

## 2020-09-27 MED ORDER — IOHEXOL 300 MG/ML  SOLN
100.0000 mL | Freq: Once | INTRAMUSCULAR | Status: AC | PRN
  Administered 2020-09-27: 100 mL via INTRAVENOUS

## 2020-09-27 MED ORDER — METOCLOPRAMIDE HCL 10 MG PO TABS: 10.0000 mg | ORAL_TABLET | Freq: Four times a day (QID) | ORAL | 0 refills | Status: AC

## 2020-09-27 MED ORDER — SODIUM CHLORIDE 0.9 % IV SOLN: INTRAVENOUS | Status: AC

## 2020-09-27 MED ORDER — SCOPOLAMINE 1 MG/3DAYS TD PT72: 1.0000 | MEDICATED_PATCH | TRANSDERMAL | 12 refills | Status: AC

## 2020-09-27 MED ORDER — AMOXICILLIN-POT CLAVULANATE 875-125 MG PO TABS: 1.0000 | ORAL_TABLET | Freq: Two times a day (BID) | ORAL | 0 refills | Status: AC

## 2020-09-27 MED ORDER — OXYCODONE HCL 5 MG/5ML PO SOLN: 5.0000 mg | Freq: Three times a day (TID) | ORAL | 0 refills | Status: AC | PRN

## 2020-09-27 NOTE — Patient Instructions (Signed)
[facility]  Discharge Instructions:   _______________________________________________________________  Thank you for choosing [facility] to provide your oncology and hematology care.  To afford each patient quality time with our providers, please arrive at least 15 minutes before your scheduled appointment.  You need to re-schedule your appointment if you arrive 10 or more minutes late.  We strive to give you quality time with our providers, and arriving late affects you and other patients whose appointments are after yours.  Also, if you no show three or more times for appointments you may be dismissed from the clinic.  Again, thank you for choosing [facility]. Our hope is that these requests will allow you access to exceptional care and in a timely manner. _______________________________________________________________  If you have questions after your visit, please contact our office at [phone] between the hours of 8:30 a.m. and 5:00 p.m. Voicemails left after 4:30 p.m. will not be returned until the following business day. _______________________________________________________________  For prescription refill requests, have your pharmacy contact our office. _______________________________________________________________  Recommendations made by the consultant and any test results will be sent to your referring physician. _______________________________________________________________ 

## 2020-09-27 NOTE — Progress Notes (Addendum)
Nutrition Follow-up:  Received secure chat from [place], [place] regarding patient being unable to tolerate 3/4 carton of osmolite 1.5, nutrition status declining, feels fatigued.  Increased in ascites decreasing ability to take more nutrition in and planning paracentesis. Patient unable to hold volume of feedings via bolus method.  Dr Raliegh Ip wanting patient to try continuous feeding.  Patient unable to take anything by mouth.   Patient's wife picked up samples of [employer] 1.4 samples that RD left on Friday, 10/22.  Per Diane patient prefers [employer] formula at this time to osmolite 1.5  Recommend [employer] 1.4 at goal rate of 70ml/hr for 20 hours (start at 11am and stop at 7am) via continuous pump.   Flush with 160ml of water at start of feeding (11am) Flush with 19ml of water at 2pm, 5pm, 8pm. Flush with 120ml of water when stops pump (7am)  Start feedings at 42ml/hr for 20 hours and increase rate of pump by 65ml/hr daily until reach goal rate of 40ml/hr.    Regimen provides 2730 calories, 120 g protein, 2322ml free water.  Meets 100% of nutritional needs.  Called wife but unavailable.  Left message with call back number.  RD has emailed new tube feeding regimen to email listed in chart.   [place], representative Barbaraann Rondo regarding order change.     NEXT VISIT: October 29, phone f/u  Jessejames Steelman B. Zenia Resides, [place], [place] Registered Dietitian [phone] (mobile)

## 2020-09-27 NOTE — Progress Notes (Signed)
Orders placed per Dr. Delton Coombes for antibiotics, nausea medication, and oral liquid medication.  Orders also placed for STAT paracentesis and STAT CT scan.

## 2020-09-27 NOTE — Progress Notes (Signed)
Orders received to infuse 1L NS over 2 hours as well as Zofran and Dex IVPB. Hydration tolerated without incident or complaint. Discharged in satisfactory condition with follow up instructions.

## 2020-09-28 ENCOUNTER — Encounter (HOSPITAL_COMMUNITY): Payer: Self-pay

## 2020-09-28 NOTE — Progress Notes (Addendum)
Nutrition  Wife called and had questions regarding options to help keep patient up 30 degrees during infusion of tube feeding.  RD discussed options.  Wife reports that pump and new formula will be arriving today to get started on continuous feeding.     Estill Llerena B. Zenia Resides, [place], [place] Registered Dietitian [phone] (mobile)

## 2020-09-30 ENCOUNTER — Ambulatory Visit (HOSPITAL_COMMUNITY)
Admission: RE | Admit: 2020-09-30 | Discharge: 2020-09-30 | Disposition: A | Discharge status: Home / Self Care | Payer: Medicare HMO | Source: Ambulatory Visit | Attending: Hematology | Admitting: Hematology

## 2020-09-30 ENCOUNTER — Other Ambulatory Visit: Payer: Self-pay

## 2020-09-30 ENCOUNTER — Inpatient Hospital Stay (HOSPITAL_COMMUNITY): Payer: Medicare HMO | Admitting: Hematology

## 2020-09-30 ENCOUNTER — Other Ambulatory Visit (HOSPITAL_COMMUNITY): Payer: Self-pay | Admitting: Hematology

## 2020-09-30 VITALS — BP 93/59 | HR 121 | Temp 97.1°F | Resp 22 | Wt 172.8 lb

## 2020-09-30 DIAGNOSIS — E440 Moderate protein-calorie malnutrition: Secondary | ICD-10-CM

## 2020-09-30 DIAGNOSIS — C160 Malignant neoplasm of cardia: Secondary | ICD-10-CM

## 2020-09-30 DIAGNOSIS — Z5111 Encounter for antineoplastic chemotherapy: Secondary | ICD-10-CM | POA: Diagnosis not present

## 2020-09-30 NOTE — Progress Notes (Signed)
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  PRIOR THERAPY: FOLFOX and nivolumab x 14 cycles from 12/31/2019 to 07/26/2020  NGS Results: PD-L1 CPS 5%, Foundation 1 MS--stable, TMB 8 Muts/Mb  CURRENT THERAPY: Paclitaxel 3 weeks on, 1 week off; ramucirumab  BRIEF ONCOLOGIC HISTORY:  Oncology History  Esophageal cancer ([place])   Initial Diagnosis   Esophageal cancer ([place])    Genetic Testing   Foundation One and PDL-1 Results:       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[place])  12/24/2019 Initial Diagnosis   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[place])   12/29/2019 Cancer Staging   Staging form: Esophagus - Adenocarcinoma, AJCC 8th Edition - Clinical stage from 12/29/2019: Stage IVB (cTX, cN3, cM1) - Signed by Timothy Jack, MD on 12/29/2019   12/31/2019 - 07/28/2020 Chemotherapy   The patient had palonosetron (ALOXI) injection 0.25 mg, 0.25 mg, Intravenous,  Once, 14 of 16 cycles Administration: 0.25 mg (12/31/2019), 0.25 mg (01/14/2020), 0.25 mg (01/28/2020), 0.25 mg (02/11/2020), 0.25 mg (02/25/2020), 0.25 mg (03/10/2020), 0.25 mg (03/24/2020), 0.25 mg (04/12/2020), 0.25 mg (04/26/2020), 0.25 mg (05/10/2020), 0.25 mg (05/24/2020), 0.25 mg (06/28/2020), 0.25 mg (07/12/2020), 0.25 mg (07/26/2020) leucovorin 868 mg in dextrose 5 % 250 mL infusion, 400 mg/m2 = 868 mg, Intravenous,  Once, 14 of 16 cycles Administration: 868 mg (12/31/2019), 868 mg (01/14/2020), 868 mg (01/28/2020), 868 mg (02/11/2020), 868 mg (02/25/2020), 868 mg (03/10/2020), 868 mg (03/24/2020), 868 mg (04/12/2020), 868 mg (04/26/2020), 868 mg (05/10/2020), 868 mg (05/24/2020), 868 mg (06/28/2020), 868 mg (07/12/2020), 868 mg (07/26/2020) oxaliplatin (ELOXATIN) 150 mg in dextrose 5 % 500 mL chemo infusion, 68 mg/m2 = 150 mg (80 %  of original dose 85 mg/m2), Intravenous,  Once, 13 of 15 cycles Dose modification: 68 mg/m2 (80 % of original dose 85 mg/m2, Cycle 1, Reason: Other (see comments), Comment: diabetic neuropathy) Administration: 150 mg (12/31/2019), 150 mg (01/14/2020), 150 mg (01/28/2020), 150 mg (02/11/2020), 150 mg (02/25/2020), 150 mg (03/10/2020), 150 mg (03/24/2020), 150 mg (04/12/2020), 150 mg (04/26/2020), 150 mg (05/10/2020), 150 mg (05/24/2020), 150 mg (06/28/2020), 150 mg (07/12/2020) fluorouracil (ADRUCIL) chemo injection 850 mg, 400 mg/m2 = 850 mg, Intravenous,  Once, 14 of 16 cycles Administration: 850 mg (12/31/2019), 850 mg (01/14/2020), 850 mg (01/28/2020), 850 mg (02/11/2020), 850 mg (02/25/2020), 850 mg (03/10/2020), 850 mg (03/24/2020), 850 mg (04/12/2020), 850 mg (04/26/2020), 850 mg (05/10/2020), 850 mg (05/24/2020), 850 mg (06/28/2020), 850 mg (07/26/2020) fluorouracil (ADRUCIL) 5,000 mg in sodium chloride 0.9 % 150 mL chemo infusion, 2,310 mg/m2 = 5,200 mg, Intravenous, 1 Day/Dose, 14 of 16 cycles Administration: 5,000 mg (12/31/2019), 5,000 mg (01/14/2020), 5,000 mg (01/28/2020), 5,000 mg (02/11/2020), 5,000 mg (02/25/2020), 5,000 mg (03/10/2020), 5,000 mg (03/24/2020), 5,000 mg (04/12/2020), 5,000 mg (04/26/2020), 5,000 mg (05/10/2020), 5,000 mg (05/24/2020), 5,200 mg (06/28/2020), 5,200 mg (07/26/2020) nivolumab (OPDIVO) 240 mg in sodium chloride 0.9 % 100 mL chemo infusion, 240 mg (100 % of original dose 240 mg), Intravenous, Once, 14 of 16 cycles Dose modification: 240 mg (original dose 240 mg, Cycle 1, Reason: Provider Judgment) Administration: 240 mg (12/31/2019), 240 mg (01/14/2020), 240 mg (01/28/2020), 240 mg (02/11/2020), 240 mg (02/25/2020), 240 mg (03/10/2020), 240 mg (03/24/2020), 240 mg (04/12/2020), 240 mg (04/26/2020), 240 mg (05/10/2020), 240 mg (05/24/2020), 240 mg (06/28/2020), 240 mg (07/12/2020), 240 mg (  07/26/2020)  for chemotherapy treatment.     [place] One and PDL-1 Results:       08/11/2020 -  Chemotherapy    The patient had PACLitaxel (TAXOL) 168 mg in sodium chloride 0.9 % 250 mL chemo infusion (</= 16m/m2), 80 mg/m2 = 168 mg, Intravenous,  Once, 2 of 6 cycles Dose modification: 64 mg/m2 (80 % of original dose 80 mg/m2, Cycle 2, Reason: Other (see comments), Comment: neuropathy) Administration: 168 mg (08/11/2020), 168 mg (08/20/2020), 168 mg (08/25/2020), 138 mg (09/08/2020), 138 mg (09/15/2020) ramucirumab (CYRAMZA) 700 mg in sodium chloride 0.9 % 180 mL chemo infusion, 8 mg/kg = 700 mg, Intravenous, Once, 2 of 6 cycles Administration: 700 mg (08/11/2020), 700 mg (08/25/2020), 700 mg (09/08/2020)  for chemotherapy treatment.      CANCER STAGING: Cancer Staging 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[facility] Staging form: Esophagus - Adenocarcinoma, AJCC 8th Edition - Clinical stage from 12/29/2019: Stage IVB (cTX, cN3, cM1) - Signed by KDerek Jack MD on 12/29/2019   INTERVAL HISTORY:  Timothy Oconnor Comes a 69y.o. male, returns for routine follow-up of his stage IV GE junction adenocarcinoma. RDevonwas last seen on 09/27/2020.   Today he is accompanied by his wife and he reports feeling tired. He received the feeding pump, but was not instructed on how to use it and has not set it up yet. He gets SOB with activity and even talking. He was able [facility]. His belly continues to be distended. He vomited yesterday consisting of greenish bile and an occasional blood clot. He continues having drainage around his PEG-tube and has to change his dressing often. He is able to drink water and take ice PO. He denies pain, though he reports abdominal discomfort. He has not had a BM in 2 days and he is taking a liquid stool softener.   REVIEW OF SYSTEMS:  Review of Systems  Constitutional: Positive for appetite change (depleted) and fatigue (depleted).  Respiratory: Positive for cough, shortness of breath (w/ any activity) and wheezing.   Gastrointestinal: Positive for  abdominal distention (& discomfort), nausea and vomiting.  Neurological: Positive for dizziness, light-headedness and numbness (tingling).  Psychiatric/Behavioral: The patient is nervous/anxious.   All other systems reviewed and are negative.   PAST MEDICAL/SURGICAL HISTORY:  Past Medical History:  Diagnosis Date  . 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[place]   . Anxiety   . Diabetes ([place]   . Heart attack ([place]   . Heart disease   . High cholesterol   . Hyperplasia of prostate   . Hypertension   . Low back pain   . Port-A-Cath in place 12/30/2019  . Tremor    Past Surgical History:  Procedure Laterality Date  . arterectomy     1993  . BIOPSY  12/01/2019   Procedure: BIOPSY;  Surgeon: RRogene Houston MD;  Location[facility];  Service: Endoscopy;;  esophagus  . ESOPHAGOGASTRODUODENOSCOPY (EGD) WITH PROPOFOL N/A 12/01/2019   Procedure: ESOPHAGOGASTRODUODENOSCOPY (EGD) WITH PROPOFOL;  Surgeon: RRogene Houston MD;  Location: [facility];  Service: Endoscopy;  Laterality: N/A;  . ESOPHAGOGASTRODUODENOSCOPY (EGD) WITH PROPOFOL N/A 12/24/2019   Procedure: ESOPHAGOGASTRODUODENOSCOPY (EGD) WITH PROPOFOL;  Surgeon: JAviva Signs MD;  Location: [facility] ORS;  Service: General;  Laterality: N/A;  . Heart stents     2002, 2004, 2006  . PEG PLACEMENT N/A 12/24/2019   Procedure: PERCUTANEOUS ENDOSCOPIC GASTROSTOMY (PEG) PLACEMENT;  Surgeon: JAviva Signs MD;  Location: [facility] ORS;  Service: General;  Laterality: N/A;  . PORTACATH PLACEMENT Left 12/24/2019   Procedure: INSERTION PORT-A-CATH;  Surgeon: Aviva Signs, MD;  Location: [facility] ORS;  Service: General;  Laterality: Left;    SOCIAL HISTORY:  Social History   Socioeconomic History  . Marital status: Married    Spouse name: Not on file  . Number of children: 2  . Years of education: 55  . Highest education level: Not on file  Occupational History  . Occupation: [employer]    Comment: Delivers medication  Tobacco Use  .  Smoking status: Former Smoker    Packs/day: 1.00    Years: 50.00    Pack years: 50.00    Quit date: 12/21/2017    Years since quitting: 2.7  . Smokeless tobacco: Never Used  . Tobacco comment: Quit 2017  Vaping Use  . Vaping Use: Never used  Substance and Sexual Activity  . Alcohol use: Yes    Alcohol/week: 0.0 standard drinks    Comment: 3 drinks per week  . Drug use: No  . Sexual activity: Yes  Other Topics Concern  . Not on file  Social History Narrative   Lives at home with his wife.   Right-handed.   2 diet cokes per day.   Social Determinants of Health   Financial Resource Strain: Low Risk   . Difficulty of Paying Living Expenses: Not hard at all  Food Insecurity: No Food Insecurity  . Worried About Charity fundraiser in the Last Year: Never true  . Ran Out of Food in the Last Year: Never true  Transportation Needs: No Transportation Needs  . Lack of Transportation (Medical): No  . Lack of Transportation (Non-Medical): No  Physical Activity: Insufficiently Active  . Days of Exercise per Week: 2 days  . Minutes of Exercise per Session: 30 min  Stress: No Stress Concern Present  . Feeling of Stress : Only a little  Social Connections: Socially Integrated  . Frequency of Communication with Friends and Family: More than three times a week  . Frequency of Social Gatherings with Friends and Family: More than three times a week  . Attends Religious Services: 1 to 4 times per year  . Active Member of Clubs or Organizations: No  . Attends Archivist Meetings: 1 to 4 times per year  . Marital Status: Married  Human resources officer Violence: Not At Risk  . Fear of Current or Ex-Partner: No  . Emotionally Abused: No  . Physically Abused: No  . Sexually Abused: No    FAMILY HISTORY:  Family History  Problem Relation Age of Onset  . Alzheimer's disease Mother   . Diabetes Father   . Heart disease Father   . Stroke Father   . Heart disease Brother   . Colon  cancer Brother   . Alzheimer's disease Sister     CURRENT MEDICATIONS:  Current Outpatient Medications  Medication Sig Dispense Refill  . ALPRAZolam (XANAX) 0.5 MG tablet Take 1 tablet (0.5 mg total) by mouth 2 (two) times daily as needed for anxiety. (Patient taking differently: Take 0.5 mg by mouth See admin instructions. Take 0.5 mg by mouth at bedtime and an additional 0.5 mg once a day as needed for anxiety) 30 tablet 0  . amoxicillin-clavulanate (AUGMENTIN) 875-125 MG tablet Take 1 tablet by mouth 2 (two) times daily. 14 tablet 0  . aspirin EC 81 MG tablet Take 81 mg by mouth daily.     Marland Kitchen  atorvastatin (LIPITOR) 80 MG tablet Take 80 mg by mouth every evening.     . ezetimibe (ZETIA) 10 MG tablet Take 10 mg by mouth daily.     . folic acid (FOLVITE) 952 MCG tablet Take 800 mcg by mouth daily.     . furosemide (LASIX) 20 MG tablet Take 20 mg by mouth daily as needed for fluid or edema (only if Systolic number is 841 or greater).     . furosemide (LASIX) 40 MG tablet Take 1 tablet (40 mg total) by mouth daily as needed for fluid or edema. 30 tablet 3  . isosorbide mononitrate (IMDUR) 60 MG 24 hr tablet Take 60 mg by mouth daily.    . metFORMIN (GLUCOPHAGE-XR) 500 MG 24 hr tablet Take 500 mg by mouth daily as needed (for elevated BGL).     Marland Kitchen metoCLOPramide (REGLAN) 10 MG tablet Take 1 tablet (10 mg total) by mouth 4 (four) times daily. 120 tablet 0  . metoprolol tartrate (LOPRESSOR) 25 MG tablet Take 0.5 tablets (12.5 mg total) by mouth 2 (two) times daily. 60 tablet 1  . Misc. Devices MISC Please provide osmolite 1.5.  2 cartons four times daily via feeding tube (8 daily total).  Flush with 58m water before and after administration of osmolite. (Patient taking differently: Osmolite 1.5.- 1-2 cartons three times daily via feeding tube (2-6 daily total).  Flush with 60 ml's water before and after administration of Osmolite.) 1 each 0  . Multiple Vitamin (MULTI-VITAMINS) TABS Take 1 tablet by  mouth 2 (two) times a week.     .Marland KitchenoxyCODONE (ROXICODONE) 5 MG/5ML solution Take 5 mLs (5 mg total) by mouth every 8 (eight) hours as needed for severe pain. 450 mL 0  . Oxycodone HCl 10 MG TABS Take 1 tablet (10 mg total) by mouth at bedtime as needed. (Patient taking differently: Take 10 mg by mouth at bedtime as needed (for pain). ) 30 tablet 0  . PACLitaxel (TAXOL IV) Inject into the vein See admin instructions. Via IV once a week- three weeks on/1 week off    . Ramucirumab (CYRAMZA IV) Inject into the vein as directed.     .Marland Kitchenscopolamine (TRANSDERM-SCOP) 1 MG/3DAYS Place 1 patch (1.5 mg total) onto the skin every 3 (three) days. 10 patch 12  . tamsulosin (FLOMAX) 0.4 MG CAPS capsule Take 0.4 mg by mouth daily.      No current facility-administered medications for this visit.    ALLERGIES:  No Known Allergies  PHYSICAL EXAM:  Performance status (ECOG): 1 - Symptomatic but completely ambulatory  Vitals:   09/30/20 1322  BP: (!) 93/59  Pulse: (!) 121  Resp: (!) 22  Temp: (!) 97.1 F (36.2 C)  SpO2: 99%   Wt Readings from Last 3 Encounters:  09/30/20 172 lb 12.8 oz (78.4 kg)  09/27/20 170 lb 9.6 oz (77.4 kg)  09/22/20 171 lb (77.6 kg)   Physical Exam Vitals reviewed.  Constitutional:      Appearance: Normal appearance.  Pulmonary:     Breath sounds: Examination of the right-lower field reveals decreased breath sounds. Decreased breath sounds present.  Abdominal:     General: There is distension.     Palpations: Abdomen is soft. There is no mass.     Comments: PEG-tube  Musculoskeletal:     Right lower leg: Edema (trace) present.     Left lower leg: Edema (trace) present.  Neurological:     General: No focal deficit present.  Mental Status: He is alert and oriented to person, place, and time.  Psychiatric:        Mood and Affect: Mood normal.        Behavior: Behavior normal.      LABORATORY DATA:  I have reviewed the labs as listed.  CBC Latest Ref Rng & Units  09/27/2020 09/24/2020 09/23/2020  WBC 4.0 - 10.5 K/uL 6.2 4.2 4.1  Hemoglobin 13.0 - 17.0 g/dL 11.1(L) 9.3(L) 8.6(L)  Hematocrit 39 - 52 % 35.3(L) 29.5(L) 27.3(L)  Platelets 150 - 400 K/uL 561(H) 418(H) 413(H)   CMP Latest Ref Rng & Units 09/27/2020 09/23/2020 09/22/2020  Glucose 70 - 99 mg/dL 128(H) 116(H) 223(H)  BUN 8 - 23 mg/dL 28(H) 18 23  Creatinine 0.61 - 1.24 mg/dL 0.97 0.72 0.68  Sodium 135 - 145 mmol/L 138 140 137  Potassium 3.5 - 5.1 mmol/L 3.6 3.7 3.4(L)  Chloride 98 - 111 mmol/L 102 104 99  CO2 22 - 32 mmol/L _0 Calcium 8.9 - 10.3 mg/dL 8.4(L) 8.4(L) 8.7(L)  Total Protein 6.5 - 8.1 g/dL 6.6 5.7(L) 6.6  Total Bilirubin 0.3 - 1.2 mg/dL 0.5 0.5 0.4  Alkaline Phos 38 - 126 U/L 91 66 68  AST 15 - 41 U/L 27 28 34  ALT 0 - 44 U/L 32 32 38    DIAGNOSTIC IMAGING:  I have independently reviewed the scans and discussed with the patient. DG Chest 1 View  Result Date: 09/22/2020 CLINICAL DATA:  Hematemesis.  History of stomach cancer EXAM: CHEST  1 VIEW COMPARISON:  Chest CT 06/09/2020 FINDINGS: Right pleural effusion which could be small or moderate. There is presumed right lower lobe opacity. Left subclavian porta catheter with tip at the SVC. Normal heart size. IMPRESSION: Right pleural effusion/lower lobe opacity, new from July 2021. Electronically Signed   By: Monte Fantasia M.D.   On: 09/22/2020 09:43   CT CHEST WO CONTRAST  Result Date: 09/22/2020 CLINICAL DATA:  Hemoptysis, history of esophageal and gastric cancer EXAM: CT CHEST WITHOUT CONTRAST TECHNIQUE: Multidetector CT imaging of the chest was performed following the standard protocol without IV contrast. COMPARISON:  Chest x-ray from earlier in the same day, CT from 06/09/2020. FINDINGS: Cardiovascular: Somewhat limited due to lack of IV contrast. Aortic calcifications are noted. No aneurysmal dilatation is seen. No cardiac enlargement is noted. Heavy coronary calcifications are seen. Mediastinum/Nodes: Left chest  wall port is noted extending into the superior vena cava. Thoracic inlet is within normal limits. 6 mm superior lymph node is noted adjacent to the brachiocephalic arteries slightly increased in size when compared to prior exam. Pre-vascular lymph nodes are noted also increased from the prior exam now measuring approximately 8 mm in short axis. AP window lymph node is noted measuring almost 14 mm in short axis. Subcarinal lymph node is noted measuring 16 mm in short axis. No sizable hilar lymph nodes are seen although the lack of contrast enhancement limits evaluation. The esophagus demonstrates significant wall in the mid and distal portion consistent with the given clinical history. Filling defect is noted within as well. Lungs/Pleura: Left lung is well aerated without focal infiltrate. Mild basilar atelectasis is noted. Mild to moderate left pleural effusion is noted. More marked consolidation is noted in the right lower lobe and to a lesser degree in the right middle lobe consistent with pneumonia. No obstructing lesion is seen. Large right-sided pleural effusion is noted. The remainder of the right lung is within normal limits. Upper Abdomen:  Visualized upper abdomen demonstrates gastrostomy catheter in place. Mild ascites is noted. Scattered lymphadenopathy is noted but less well visualized due to the lack of IV contrast. The omental changes seen previously are noted but to a lesser degree also related to the lack of IV contrast. Musculoskeletal: Degenerative changes of the thoracic spine are noted. No definitive lytic or sclerotic lesion is seen. No acute rib abnormality is noted. IMPRESSION: Increasing bilateral pleural effusions right significantly greater than left with associated consolidation in the right middle lobe and right lower lobe likely representing pneumonia. This may be related to aspiration. Significant increase in the degree of thickening of the mid and distal esophagus consistent with the  patient's known history. This represents a significant increased when compared with July of 2021. Increase in mediastinal lymphadenopathy when compared with the prior exam although this may be reactive possibility of metastatic disease deserves consideration as well. Mild ascites improved from prior CT in August of 2021. The omental changes are again seen but to a lesser degree given the lack of IV contrast. Scattered lymphadenopathy is noted. Aortic Atherosclerosis (ICD10-I70.0). Electronically Signed   By: Inez Catalina M.D.   On: 09/22/2020 19:54   US PELVIS LIMITED (TRANSABDOMINAL ONLY)  Result Date: 09/17/2020 CLINICAL DATA:  Right groin pain and swelling after fall yesterday. EXAM: ULTRASOUND OF RIGHT GROIN SOFT TISSUES TECHNIQUE: Ultrasound examination of the groin soft tissues was performed in the area of clinical concern. COMPARISON:  CT abdomen pelvis dated August 03, 2020. FINDINGS: New right inguinal hernia containing bowel, measuring approximately 3.2 cm. IMPRESSION: 1. New right inguinal hernia containing bowel. Electronically Signed   By: Titus Dubin M.D.   On: 09/17/2020 12:21   CT CHEST ABDOMEN PELVIS W CONTRAST  Result Date: 09/27/2020 CLINICAL DATA:  History of metastatic adenocarcinoma they GE junction EXAM: CT CHEST, ABDOMEN, AND PELVIS WITH CONTRAST TECHNIQUE: Multidetector CT imaging of the chest, abdomen and pelvis was performed following the standard protocol during bolus administration of intravenous contrast. CONTRAST:  192m OMNIPAQUE IOHEXOL 300 MG/ML  SOLN COMPARISON:  Chest CT 09/22/2020 and CT abdomen/pelvis 08/03/2020 and CT chest, abdomen and pelvis 06/09/2020. FINDINGS: CT CHEST FINDINGS Cardiovascular: The heart is normal in size. No pericardial effusion. The aorta is normal in caliber. Stable atherosclerotic calcifications. No dissection. Stable significant three-vessel coronary artery calcifications. Mediastinum/Nodes: Progressive mediastinal lymphadenopathy when  compared to the prior examination from 06/09/2020. 9 mm prevascular node on image 24/2 previously measured 5 mm. 15 mm AP window node on image 26/2 previously measured 9.5 mm. 21 mm subcarinal node on image 29/2 previously measured 13 mm. Upper anterior mediastinal nodes have also enlarged. Contrast noted in the esophagus. The GE junction mass is not well demonstrated. Lungs/Pleura: Moderate-sized bilateral pleural effusions with overlying atelectasis. No pulmonary nodules to suggest pulmonary metastatic disease. I do not see any obvious enhancing pleural nodules but I would certainly be worried about pleural spread of tumor. Bilateral pleural effusions Musculoskeletal: No chest wall mass. Small bilateral supraclavicular nodes. The thyroid gland is unremarkable. The left Port-A-Cath is stable. No bony lesions are identified. CT ABDOMEN PELVIS FINDINGS Hepatobiliary: No intrahepatic metastatic lesions are identified. Moderate perihepatic fluid. The gallbladder is grossly normal. No common bile duct dilatation. Pancreas: No mass, inflammation or ductal dilatation. Spleen: Normal size.  No focal lesions. Adrenals/Urinary Tract: The adrenal glands are unremarkable and stable. Stable left renal cyst. No worrisome renal lesions or bladder lesions. High attenuation material in the right side of the bladder is likely some contrast  material. Stomach/Bowel: The stomach, duodenum, small bowel and colon are grossly normal. No obstructive findings or mass lesions. Feeding gastrostomy tube is noted in good position in the stomach. Vascular/Lymphatic: Advanced atherosclerotic calcifications involving the aorta and branch vessels but no dissection. The major venous structures are patent. There is a thick irregular rim of enhancing soft tissue mainly around the diaphragm and subdiaphragmatic. Any Um consistent with peritoneal carcinomatosis. There is also enhancing omental tumor this has a maximum thickness of 27 mm in the right  upper quadrant. This is previously 41 mm. Numerous enlarged mesenteric lymph nodes likely reflecting metastatic disease and showing slight enlargement since the prior study although some of these were obscured by fluid on that study. Small scattered retroperitoneal lymph nodes are slightly larger. Small to moderate volume abdominal/pelvic ascites Reproductive: Stable enlarged prostate gland. Moderate right-sided hydrocele and slight scrotal wall thickening. Other: Diffuse predominantly right-sided body wall edema. Musculoskeletal: No significant bony findings. IMPRESSION: 1. Overall progression of metastatic disease with enlarging mediastinal lymph nodes, progressive bilateral pleural effusions, progressive peritoneal carcinomatosis and progressive mesenteric and retroperitoneal lymph nodes. 2. I do not see any obvious residual tumor at the GE junction. Slightly smaller gastrohepatic ligament lymph node when compared to August 2021. 3. Stable advanced atherosclerotic disease involving the thoracic and abdominal aorta and branch vessels. 4. Small to moderate volume abdominal/pelvic ascites. 5. Stable prostate gland enlargement. Electronically Signed   By: Marijo Sanes M.D.   On: 09/27/2020 16:47   US Paracentesis  Result Date: 09/09/2020 INDICATION: Adenocarcinoma of the gastroesophageal junction, malignant ascites EXAM: ULTRASOUND GUIDED THERAPEUTIC PARACENTESIS MEDICATIONS: None COMPLICATIONS: None immediate PROCEDURE: Informed written consent was obtained from the patient after a discussion of the risks, benefits and alternatives to treatment. A timeout was performed prior to the initiation of the procedure. Initial ultrasound scanning demonstrates a moderate amount of ascites within the right lower abdominal quadrant. The right lower abdomen was prepped and draped in the usual sterile fashion. 1% lidocaine was used for local anesthesia. Following this, a 5 [place] Yueh catheter was introduced. An ultrasound  image was saved for documentation purposes. The paracentesis was performed. The catheter was removed and a dressing was applied. The patient tolerated the procedure well without immediate post procedural complication. FINDINGS: A total of approximately 2.2 L of yellow ascitic fluid was removed. IMPRESSION: Successful ultrasound-guided paracentesis yielding 2.2 liters of peritoneal fluid. Electronically Signed   By: Lavonia Dana M.D.   On: 09/09/2020 12:40   [place] ASCITES (ABDOMEN LIMITED)  Result Date: 09/27/2020 CLINICAL DATA:  Adenocarcinoma of the gastroesophageal junction, ascites EXAM: LIMITED ABDOMEN ULTRASOUND FOR ASCITES TECHNIQUE: Limited ultrasound survey for ascites was performed in all four abdominal quadrants. COMPARISON:  Ultrasound abdomen 09/16/2020 FINDINGS: Small amount of ascites is identified, predominantly perihepatic. No adequate collection of ascites is identified in the lower quadrants to warrant paracentesis. IMPRESSION: Insufficient ascites for paracentesis. Electronically Signed   By: Lavonia Dana M.D.   On: 09/27/2020 14:26   [place] ASCITES (ABDOMEN LIMITED)  Result Date: 09/17/2020 CLINICAL DATA:  Ascites. EXAM: LIMITED ABDOMEN ULTRASOUND FOR ASCITES TECHNIQUE: Limited ultrasound survey for ascites was performed in all four abdominal quadrants. COMPARISON:  August 18, 2020. FINDINGS: No ascites identified upon survey imaging of the abdomen. Paracentesis not performed. IMPRESSION: 1. No ascites. Electronically Signed   By: Titus Dubin M.D.   On: 09/17/2020 12:23     ASSESSMENT:  1. 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 HER-2 FISH to be  followed up. PD-L1 CPS-5%. -FOLFOX and Opdivo started on 12/31/2019. -PET scan on 03/22/2020 showed marked reduction in size and metabolic activity in the distal esophageal lesion, marked reduction in size of mediastinal adenopathy. Near complete resolution with only 2 small mildly metabolic mediastinal  lymph nodes remaining. Resolution of lymph nodes in the upper abdomen. Right hip bone lesion is still present but better. -CT CAP on 06/09/2020 showed interval resolution of previously demonstrated tree-in-bud nodularity in the lungs. Stable enlarged subcarinal lymph node and small residual lymph nodes with the gastrohepatic ligament and porta hepatis unchanged from the most recent PET scan. -CTAP on 08/03/2020 shows interval development of extensive omental caking particularly in the right upper quadrant with a more conglomerate soft tissue attenuation and large volume ascites. Irregular thickening in the distal esophagus incompletely assessed. Enlarging subcarinal and gastrohepatic nodes. -Paclitaxel and Cyramza started on 08/11/2020. -Was hospitalized for hemoptysis on 09/22/2020-09/24/2020.  Bedside laryngoscopy showed trace amount of blood at the esophageal inlet.  No active bleed. -CT scan 09/22/2020 showed increase in thickening of distal esophagus concerning for worsening malignancy and increasing mediastinal lymphadenopathy.  This was thought to be secondary to cancer treatment not worsening disease.  2.  Left vocal cord paralysis: -Status post left medialization laryngoplasty using Silastic implant done on 08/19/2020 at [facility].   PLAN:  1. Stage IV GE junction adenocarcinoma: -I have reviewed results of the CT chest, abdomen and pelvis from 09/27/2020 which showed slightly enlarging lymph nodes in the chest by few millimeters.  But this was compared to scan from 06/09/2020.  No obvious residual tumor was seen at GE junction.  Omental tumor measures 27 mm, down from 41 mm in the right upper quadrant.  Numerous enlarged mesenteric lymph nodes likely reflecting metastatic disease showing slight enlargement since prior study although some of this were obscured by fluid on the prior study. -Patient's functional status is declining. -Hence I have recommended against  chemotherapy. -Patient's wife will call as next week if there is any improvement in his functional status.  Otherwise we will transition him to palliative care.  2. Hoarseness of voice: -Hoarseness improved temporarily after the procedure on 08/19/2020.  Hoarseness has come back again.  3.Weight loss: -He will be initiated on continuous pump for tube feeds at 50 mL/h and titrate up as needed.  4. Bone metastasis: -We will hold Zometa at this time.  5. Diabetes: -Continue Metformin.  6. Hypertension: -Blood pressure is 93/59.  Continue to hold Lopressor.  7.Anxiety: -Continue Xanax as needed.  8. Ascites: -We sent him for ultrasound today which did not show adequate ascites for paracentesis.  9.  Nausea: -Continue Reglan at home.    Orders placed this encounter:  Orders Placed This Encounter  Procedures  . US Paracentesis     Eugenia Eldredge, MD [place] [phone]   I, Milinda Antis, am acting as a scribe for Dr. Sanda Linger.  I, Timothy Jack MD, have reviewed the above documentation for accuracy and completeness, and I agree with the above.

## 2020-09-30 NOTE — Patient Instructions (Signed)
[place] at [facility] Discharge Instructions  You were seen today by Dr. Delton Coombes. He went over your recent results and scans. You will have a paracentesis done today. Dr. Delton Coombes will see you back when needed for follow up.   Thank you for choosing [place] at [facility] to provide your oncology and hematology care.  To afford each patient quality time with our provider, please arrive at least 15 minutes before your scheduled appointment time.   If you have a lab appointment with the [facility] please come in thru the Main Entrance and check in at the main information desk  You need to re-schedule your appointment should you arrive 10 or more minutes late.  We strive to give you quality time with our providers, and arriving late affects you and other patients whose appointments are after yours.  Also, if you no show three or more times for appointments you may be dismissed from the clinic at the providers discretion.     Again, thank you for choosing [facility].  Our hope is that these requests will decrease the amount of time that you wait before being seen by our physicians.       _____________________________________________________________  Should you have questions after your visit to [facility], please contact our office at [phone] between the hours of 8:00 a.m. and 4:30 p.m.  Voicemails left after 4:00 p.m. will not be returned until the following business day.  For prescription refill requests, have your pharmacy contact our office and allow 72 hours.    Cancer Center Support Programs:   > Cancer Support Group  2nd Tuesday of the month 1pm-2pm, Journey Room

## 2020-10-01 ENCOUNTER — Encounter (HOSPITAL_COMMUNITY): Payer: Medicare HMO

## 2020-10-01 ENCOUNTER — Telehealth (HOSPITAL_COMMUNITY): Payer: Self-pay

## 2020-10-01 NOTE — Telephone Encounter (Addendum)
Nutrition  Received phone call from wife this pm.  Reports that they were able to start pump last evening and had to increase rate to 23m/hr to get it to flow into stomach.  Wife reports at 542mhr would stop at entrance to stomach and needed more pressure to flow through.  Reports patient started throwing up, spitting up blood some bright red some dark red.  Reports that patient did not want to go to ED.  States that treatment has been stopped.  She says that hospice nurse came today and met with her and patient.  She had said that if they wanted to try feeding again it would be up to them.  Wife asking if anything else nutritionally can be done or further recommendations.   Encouraged wife to focus on comfort for the patient. No further nutrition recommendations at this time.  Wife appreciative of call.   Few minutes after first call received 2nd call from wife and step son wanted to speak with RD regarding parenteral nutrition.  Patient gave verbal permission via phone to RD to have son speak with RD.  Son interested in trying parenteral nutrition with patient.  Discussed with son that RD would need to discuss with MD. Risks and benefits of parenteral nutrition would need to be weighed based on patients condition and expected outcome. Parenteral nutrition likely not to provide benefit in advanced cancer. Spoke with Diane, RN and will discuss with MD.    JoErnesto RutherfordAlZenia ResidesRDPlacervilleLDEdgaregistered Dietitian [phone])

## 2020-10-01 NOTE — Telephone Encounter (Signed)
Patient's wife called stating that patient saw Dr. Delton Coombes yesterday and had discussed discontinuing treatment due to him being weak. Patient's wife states that last night she got his nutrition pump started around 2200 or 2300 last night and about an hour into it he started spitting up what looks like coffee grounds. She states that she cut the pump off around 0300. She states that he does not want to go to the ER because he says that they are going to do the same thing they did a few weeks ago when he had a GI bleed. Patient's wife states that he is just not doing good and breathing sounds raspy. She wants to know if hospice should be called in or what they should do?  Spoke with Dr. Raliegh Ip and he recommends referral for hospice. Patient wife informed and is agreeable. Referral is being sent.

## 2020-11-02 MED ORDER — BARIUM SULFATE 2.1 % PO SUSP
ORAL | Status: AC
  Filled 2020-11-02: qty 1

## 2020-11-03 DEATH — deceased

## 2021-02-26 IMAGING — US US PARACENTESIS
1 series · 1 of 1 positions shown · non-contrast
Comparison: none

INDICATION: Malignant ascites, adenocarcinoma of the gastroesophageal junction

[Series 1: us paracentesis · 0.25mm/px · 1 of 1 slices shown]
[im 1/1]
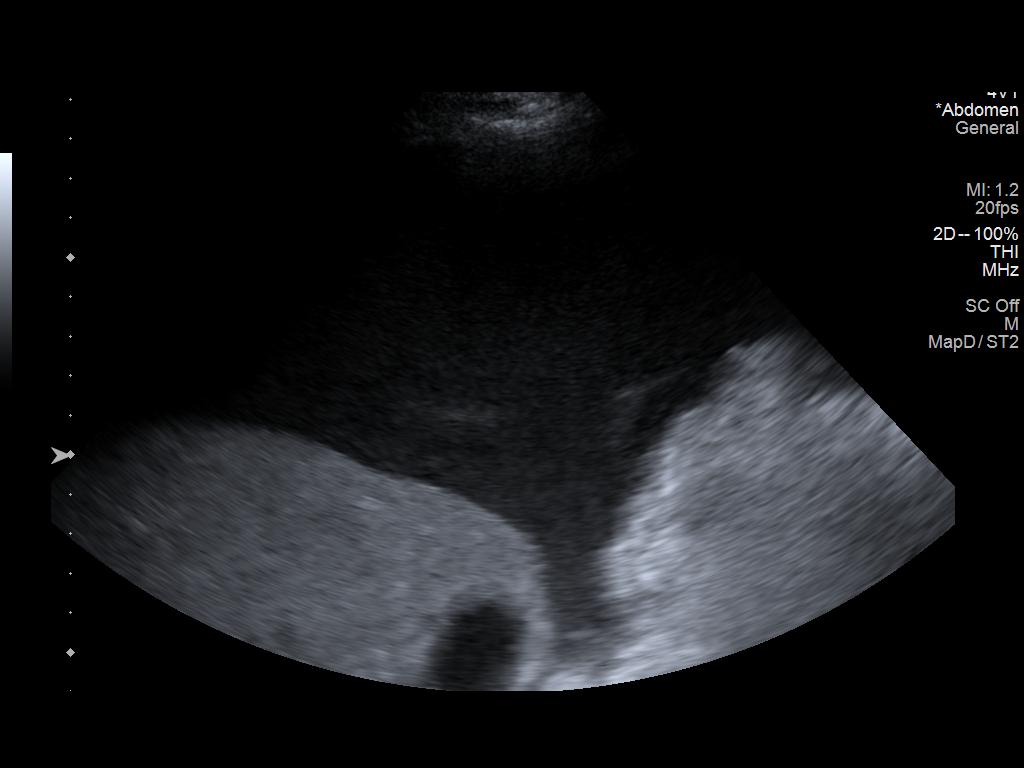

[1 of 1 positions shown; findings below may reference images not displayed]

EXAM:
ULTRASOUND GUIDED THERAPEUTIC PARACENTESIS

MEDICATIONS:
None

COMPLICATIONS:
None immediate

PROCEDURE:
Informed written consent was obtained from the patient after a
discussion of the risks, benefits and alternatives to treatment. A
timeout was performed prior to the initiation of the procedure.

Initial ultrasound scanning demonstrates a large amount of ascites
within the right lower abdominal quadrant. The right lower abdomen
was prepped and draped in the usual sterile fashion. 1% lidocaine
was used for local anesthesia.

Following this, a 5 French Yueh catheter was introduced. An
ultrasound image was saved for documentation purposes. The
paracentesis was performed. The catheter was removed and a dressing
was applied. The patient tolerated the procedure well without
immediate post procedural complication.

Patient received post-procedure intravenous albumin; see nursing
notes for details.
FINDINGS: A total of approximately 5.8 L of yellow fluid was removed.
IMPRESSION: Successful ultrasound-guided paracentesis yielding 5.8 liters of
peritoneal fluid.

## 2021-03-05 IMAGING — US US ABDOMEN LIMITED
1 series · 2 of 2 positions shown · non-contrast
Comparison: 08/11/2020

CLINICAL DATA: Adenocarcinoma of the gastroesophageal junction,
recurrent ascites

EXAM:
LIMITED ABDOMEN ULTRASOUND FOR ASCITES
TECHNIQUE: Limited ultrasound survey for ascites was performed in all four
abdominal quadrants.

[Series 1: us abdomen limited · 0.25mm/px · 2 of 2 slices shown]
[im 1/2]
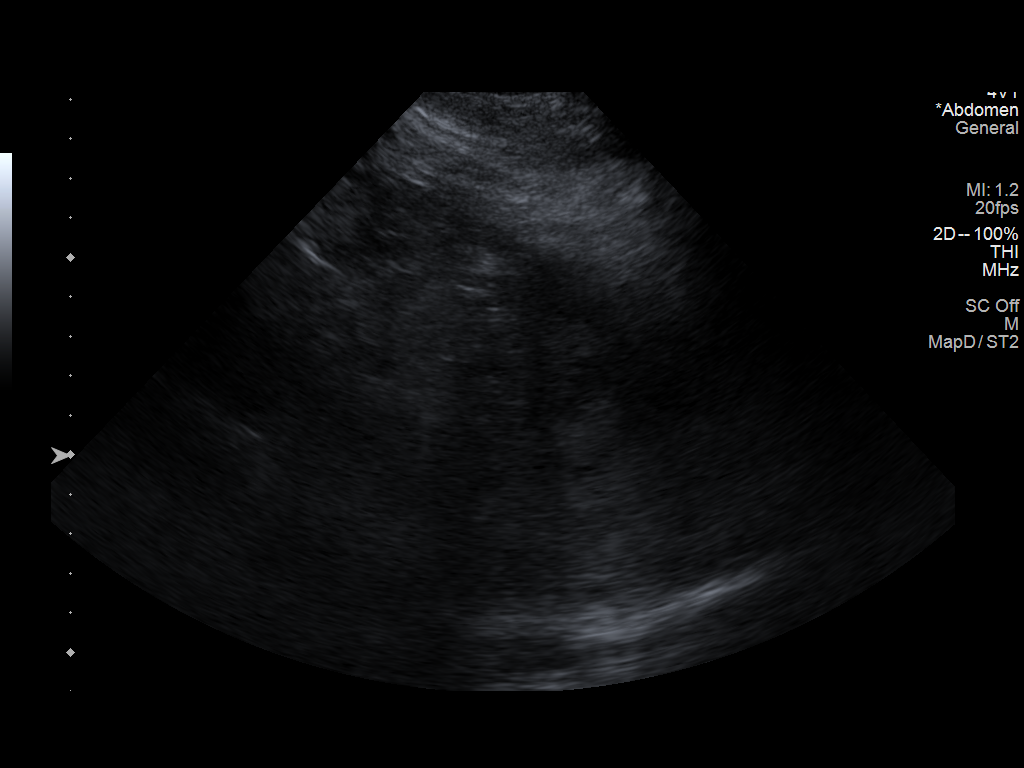
[im 2/2]
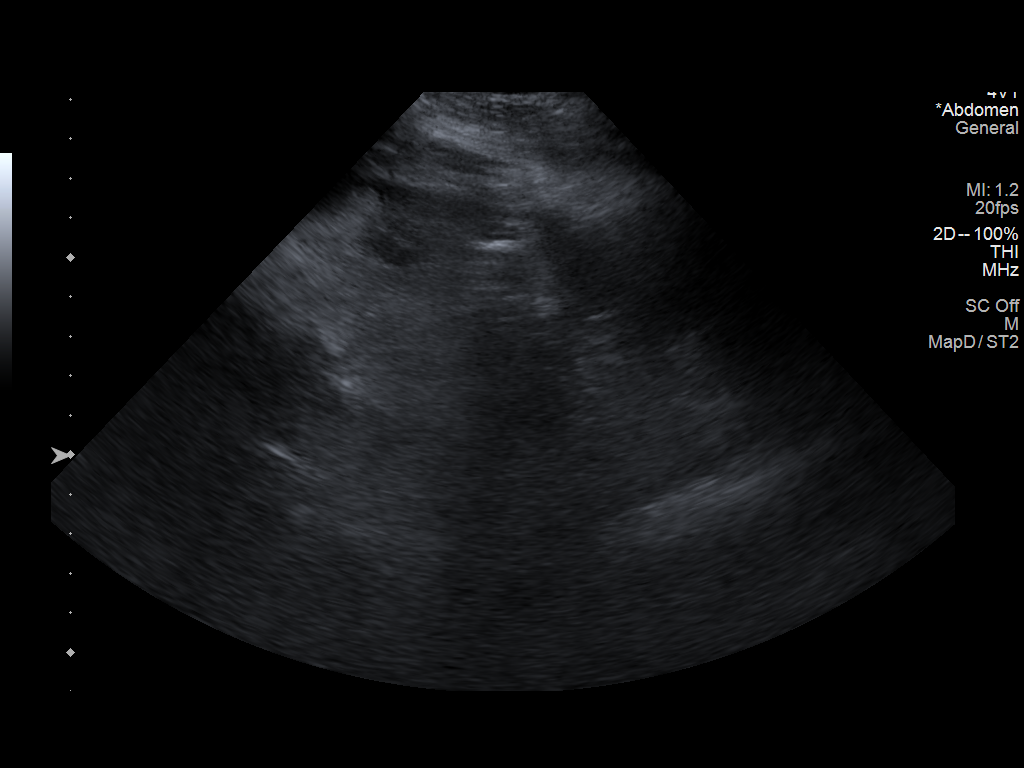

[2 of 2 positions shown; findings below may reference images not displayed]

FINDINGS: No ascites identified upon survey imaging of the abdomen.

Paracentesis not performed.
IMPRESSION: No ascites identified.

## 2021-03-27 IMAGING — US US PARACENTESIS
1 series · 6 of 6 positions shown · non-contrast
Comparison: none

INDICATION: Adenocarcinoma of the gastroesophageal junction, malignant ascites

[Series 1: us paracentesis · 6 of 6 slices shown]
[im 1/6]
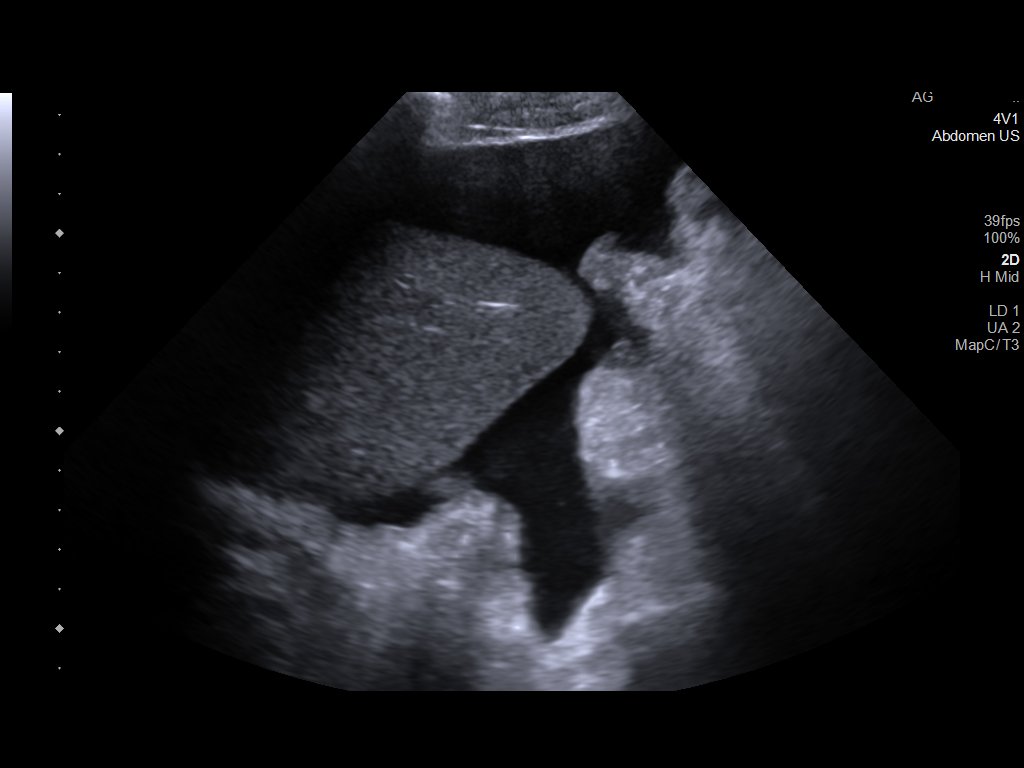
[im 2/6]
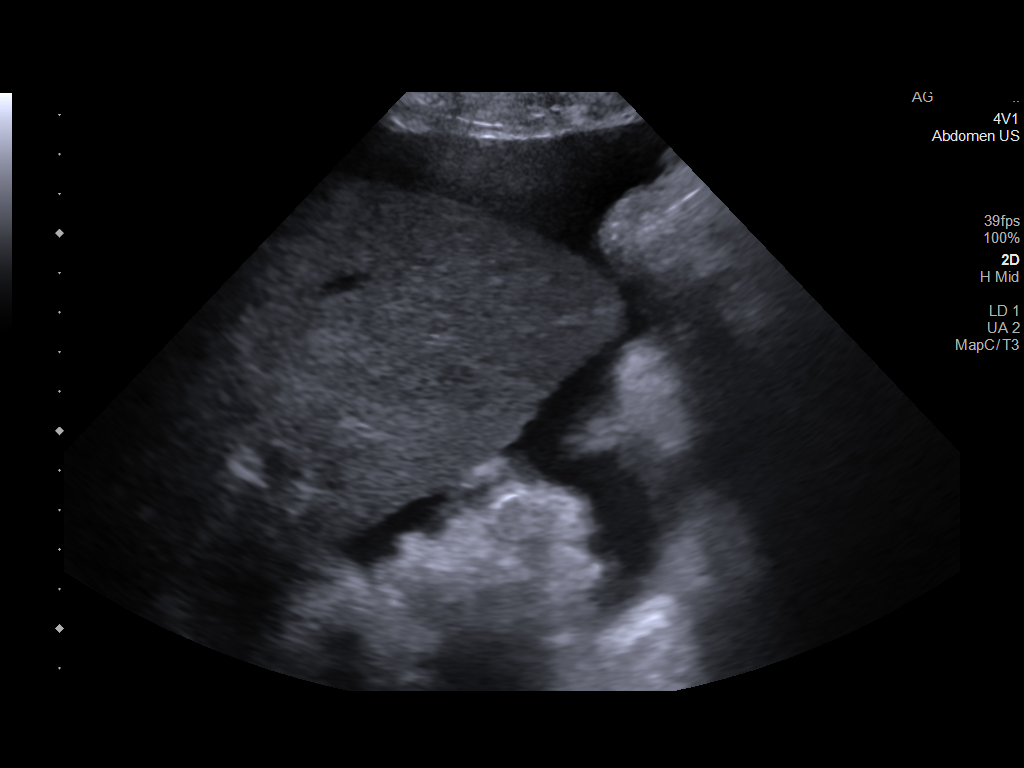
[im 3/6]
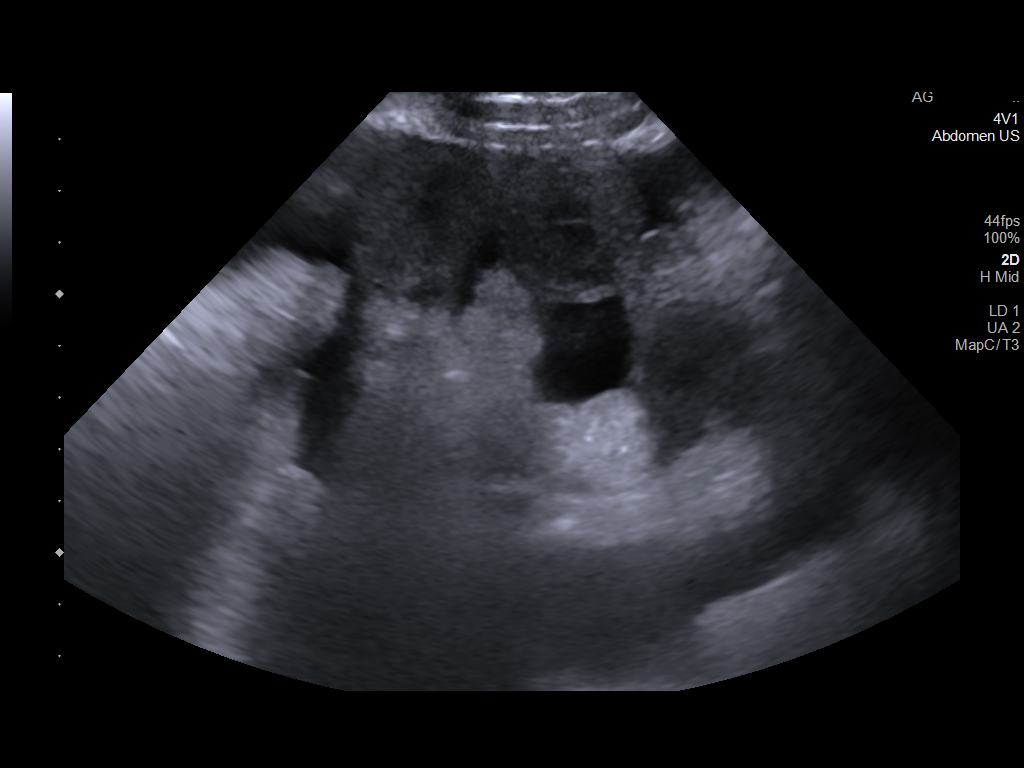
[im 4/6]
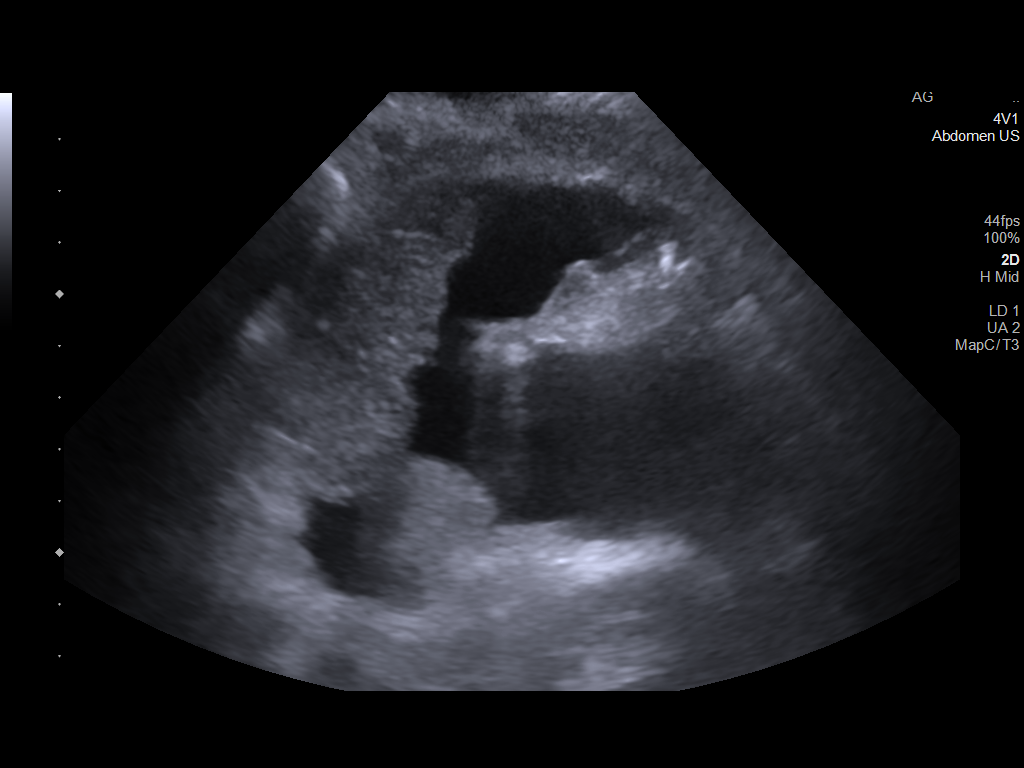
[im 5/6]
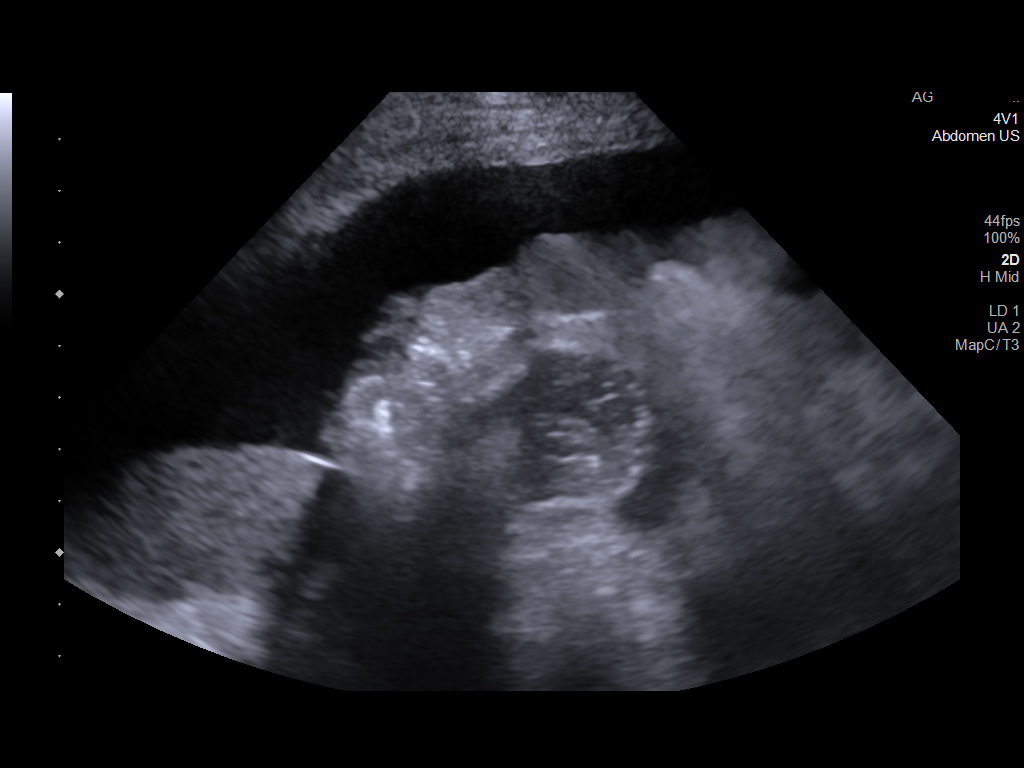
[im 6/6]
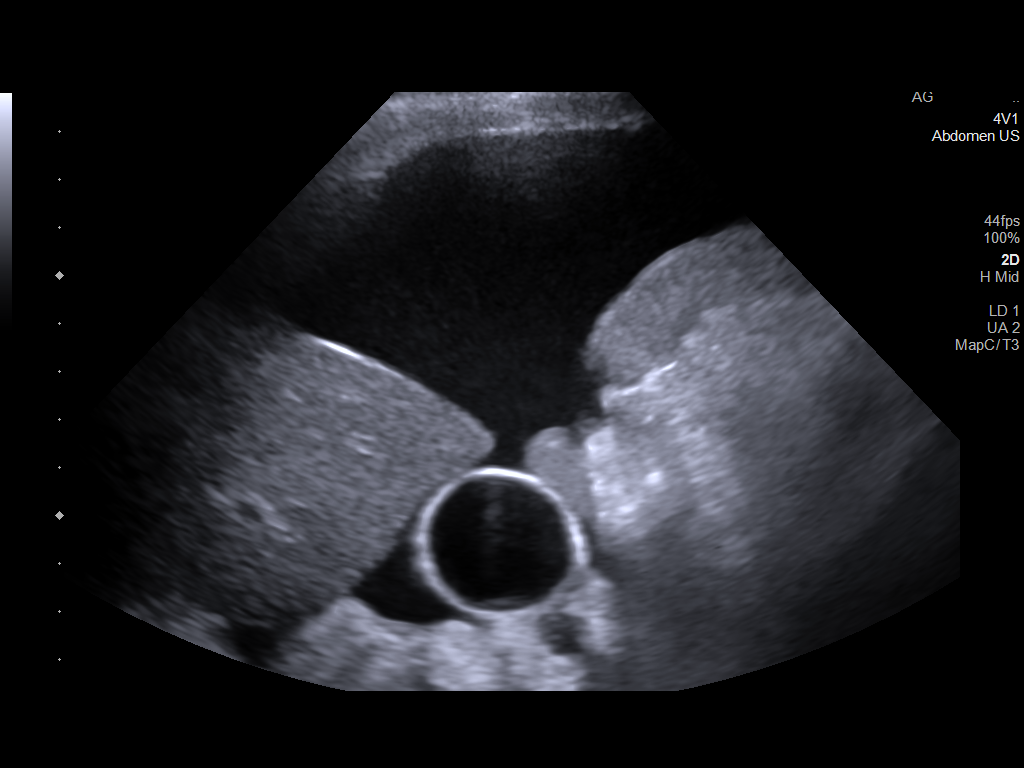

[6 of 6 positions shown; findings below may reference images not displayed]

EXAM:
ULTRASOUND GUIDED THERAPEUTIC PARACENTESIS

MEDICATIONS:
None

COMPLICATIONS:
None immediate

PROCEDURE:
Informed written consent was obtained from the patient after a
discussion of the risks, benefits and alternatives to treatment. A
timeout was performed prior to the initiation of the procedure.

Initial ultrasound scanning demonstrates a moderate amount of
ascites within the right lower abdominal quadrant. The right lower
abdomen was prepped and draped in the usual sterile fashion. 1%
lidocaine was used for local anesthesia.

Following this, a 5 French Yueh catheter was introduced. An
ultrasound image was saved for documentation purposes. The
paracentesis was performed. The catheter was removed and a dressing
was applied. The patient tolerated the procedure well without
immediate post procedural complication.
FINDINGS: A total of approximately 2.2 L of yellow ascitic fluid was removed.
IMPRESSION: Successful ultrasound-guided paracentesis yielding 2.2 liters of
peritoneal fluid.

## 2021-04-09 IMAGING — DX DG CHEST 1V
1 series · 1 of 1 positions shown · non-contrast
Comparison: Chest CT 06/09/2020

CLINICAL DATA: Hematemesis.  History of stomach cancer

EXAM:
CHEST  1 VIEW

[chest ap]
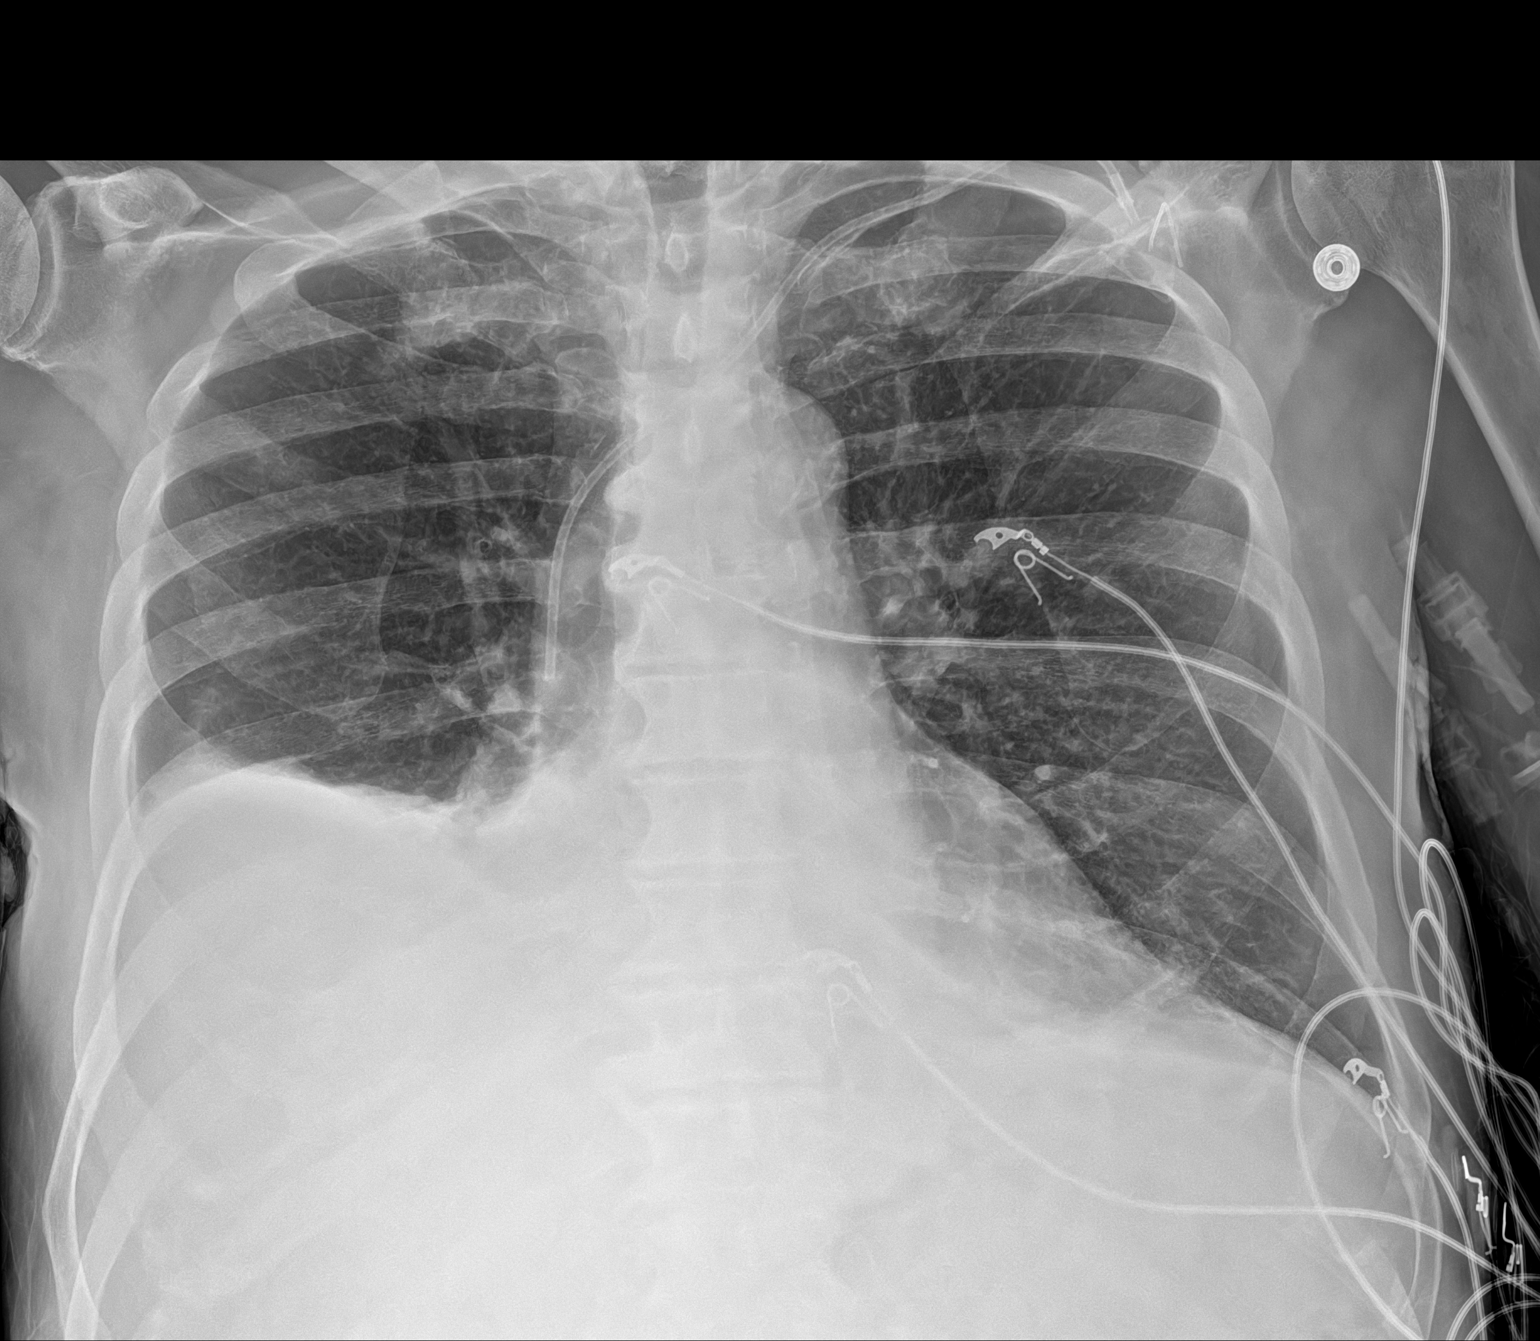

[1 of 1 positions shown; findings below may reference images not displayed]

FINDINGS: Right pleural effusion which could be small or moderate. There is
presumed right lower lobe opacity. Left subclavian porta catheter
with tip at the SVC. Normal heart size.
IMPRESSION: Right pleural effusion/lower lobe opacity, new from June 2020.

## 2021-04-17 IMAGING — US US ABDOMEN LIMITED
1 series · 4 of 4 positions shown · non-contrast
Comparison: CT chest abdomen pelvis 09/27/2020

CLINICAL DATA: Adenocarcinoma of gastroesophageal junction, ascites

EXAM:
LIMITED ABDOMEN ULTRASOUND FOR ASCITES
TECHNIQUE: Limited ultrasound survey for ascites was performed in all four
abdominal quadrants.

[Series 1: us paracentesis · 4 of 4 slices shown]
[im 1/4]
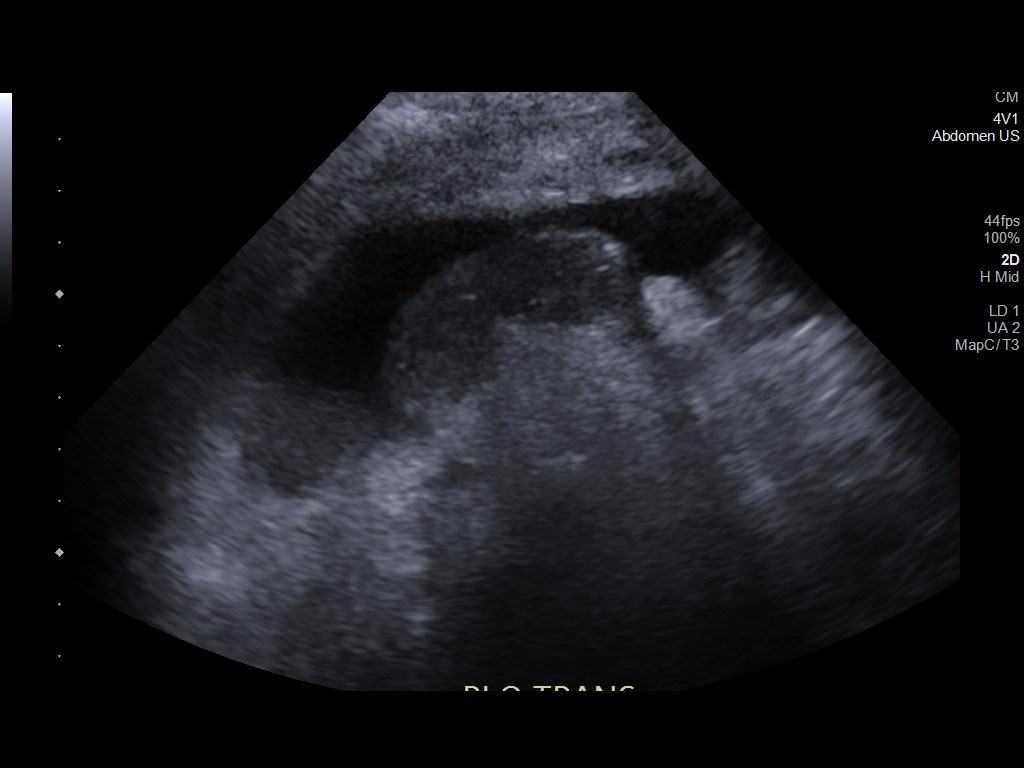
[im 2/4]
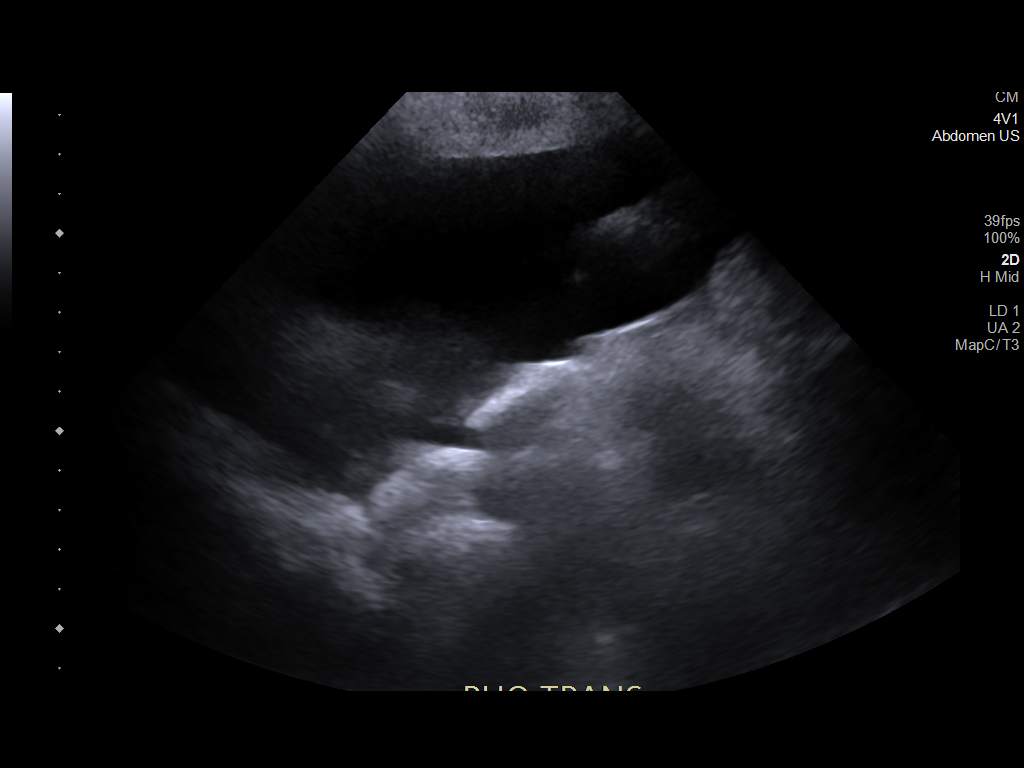
[im 3/4]
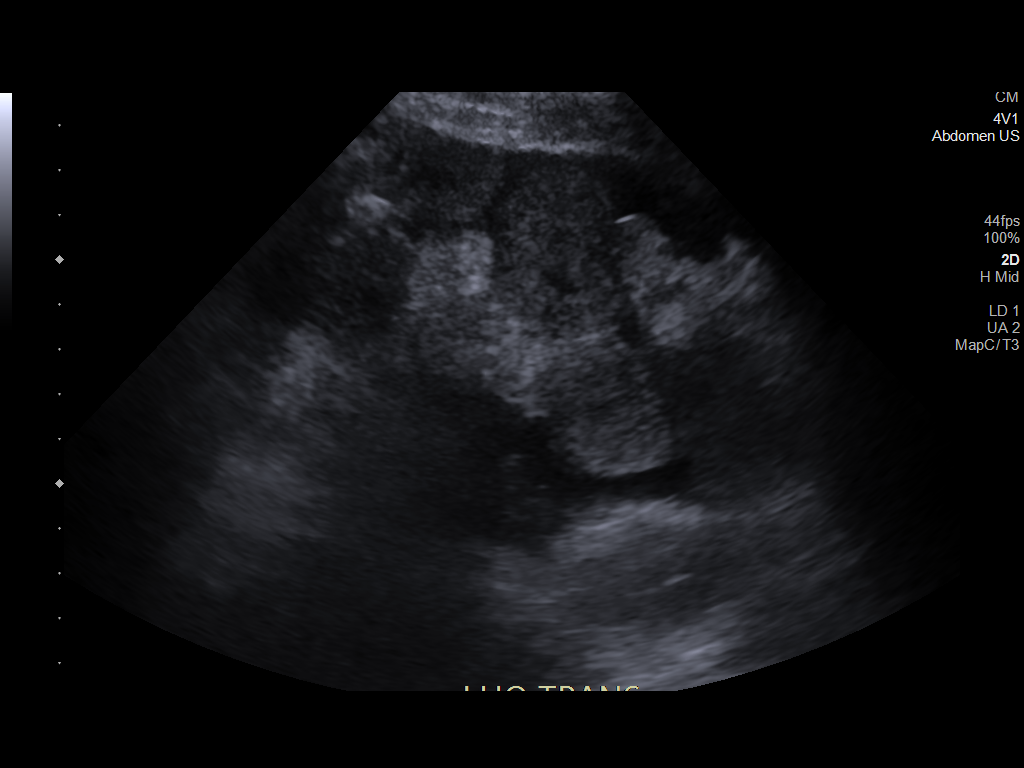
[im 4/4]
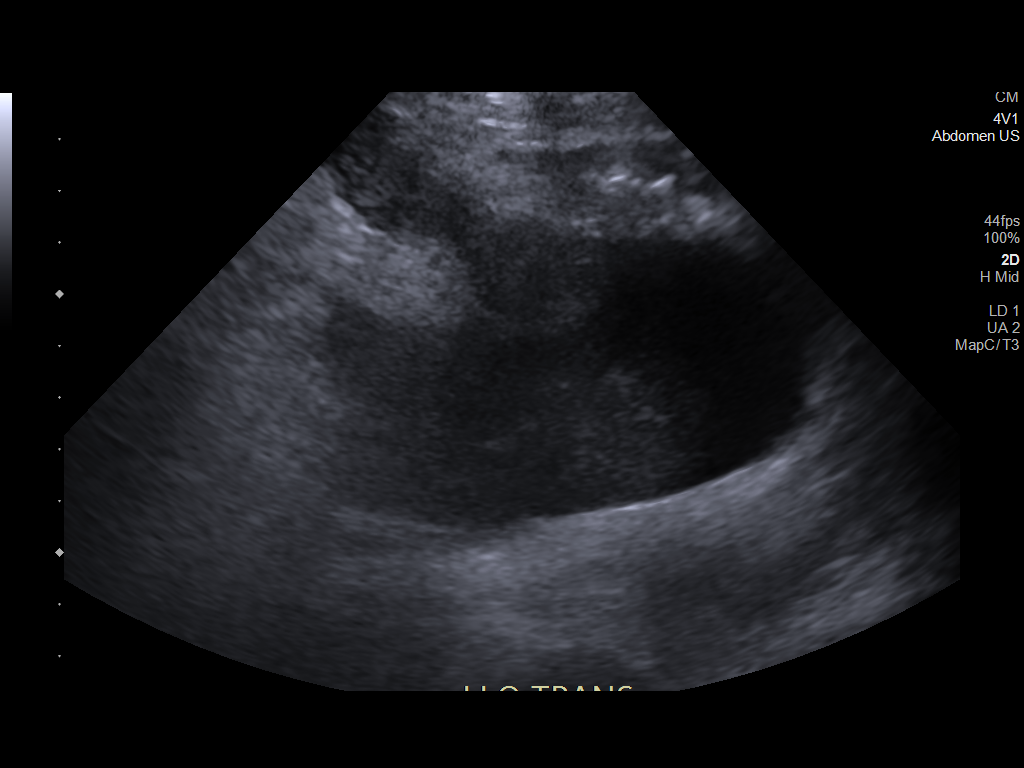

[4 of 4 positions shown; findings below may reference images not displayed]

FINDINGS: Small volume ascites identified perihepatic and RIGHT gutter.

Significant overlying skin thickening.

Volume of ascites is insufficient for expected therapeutic benefit
from paracentesis.

Discussed with patient.
IMPRESSION: Low volume ascites; paracentesis not performed.

## 2024-09-19 NOTE — Telephone Encounter (Signed)
 Erroneous encounter
